# Patient Record
Sex: Female | Born: 1950
Health system: Southern US, Community
[De-identification: ages and names within clinical notes are randomized; demographics above are authoritative.]

## PROBLEM LIST (undated history)

## (undated) DIAGNOSIS — E785 Hyperlipidemia, unspecified: Secondary | ICD-10-CM

## (undated) DIAGNOSIS — I10 Essential (primary) hypertension: Secondary | ICD-10-CM

## (undated) DIAGNOSIS — M1990 Unspecified osteoarthritis, unspecified site: Secondary | ICD-10-CM

## (undated) DIAGNOSIS — IMO0001 Reserved for inherently not codable concepts without codable children: Secondary | ICD-10-CM

## (undated) DIAGNOSIS — G709 Myoneural disorder, unspecified: Secondary | ICD-10-CM

## (undated) DIAGNOSIS — T8859XA Other complications of anesthesia, initial encounter: Secondary | ICD-10-CM

## (undated) DIAGNOSIS — M7551 Bursitis of right shoulder: Secondary | ICD-10-CM

## (undated) DIAGNOSIS — R609 Edema, unspecified: Secondary | ICD-10-CM

## (undated) DIAGNOSIS — E669 Obesity, unspecified: Secondary | ICD-10-CM

## (undated) DIAGNOSIS — L02419 Cutaneous abscess of limb, unspecified: Secondary | ICD-10-CM

## (undated) DIAGNOSIS — IMO0002 Reserved for concepts with insufficient information to code with codable children: Secondary | ICD-10-CM

## (undated) DIAGNOSIS — G4733 Obstructive sleep apnea (adult) (pediatric): Secondary | ICD-10-CM

## (undated) DIAGNOSIS — F329 Major depressive disorder, single episode, unspecified: Secondary | ICD-10-CM

## (undated) DIAGNOSIS — F419 Anxiety disorder, unspecified: Secondary | ICD-10-CM

## (undated) DIAGNOSIS — G4730 Sleep apnea, unspecified: Secondary | ICD-10-CM

## (undated) DIAGNOSIS — M2550 Pain in unspecified joint: Secondary | ICD-10-CM

## (undated) DIAGNOSIS — Z8709 Personal history of other diseases of the respiratory system: Secondary | ICD-10-CM

## (undated) DIAGNOSIS — R600 Localized edema: Secondary | ICD-10-CM

## (undated) DIAGNOSIS — T4145XA Adverse effect of unspecified anesthetic, initial encounter: Secondary | ICD-10-CM

## (undated) DIAGNOSIS — M2540 Effusion, unspecified joint: Secondary | ICD-10-CM

## (undated) DIAGNOSIS — F32A Depression, unspecified: Secondary | ICD-10-CM

## (undated) DIAGNOSIS — L03119 Cellulitis of unspecified part of limb: Secondary | ICD-10-CM

## (undated) DIAGNOSIS — Z973 Presence of spectacles and contact lenses: Secondary | ICD-10-CM

## (undated) DIAGNOSIS — K219 Gastro-esophageal reflux disease without esophagitis: Secondary | ICD-10-CM

## (undated) DIAGNOSIS — C50919 Malignant neoplasm of unspecified site of unspecified female breast: Secondary | ICD-10-CM

## (undated) DIAGNOSIS — T7840XA Allergy, unspecified, initial encounter: Secondary | ICD-10-CM

## (undated) DIAGNOSIS — G2581 Restless legs syndrome: Secondary | ICD-10-CM

## (undated) DIAGNOSIS — Z923 Personal history of irradiation: Secondary | ICD-10-CM

## (undated) DIAGNOSIS — H269 Unspecified cataract: Secondary | ICD-10-CM

## (undated) HISTORY — DX: Presence of spectacles and contact lenses: Z97.3

## (undated) HISTORY — PX: JOINT REPLACEMENT: SHX530

## (undated) HISTORY — DX: Cellulitis of unspecified part of limb: L03.119

## (undated) HISTORY — DX: Obstructive sleep apnea (adult) (pediatric): G47.33

## (undated) HISTORY — PX: COLONOSCOPY: SHX174

## (undated) HISTORY — DX: Myoneural disorder, unspecified: G70.9

## (undated) HISTORY — DX: Anxiety disorder, unspecified: F41.9

## (undated) HISTORY — DX: Depression, unspecified: F32.A

## (undated) HISTORY — DX: Major depressive disorder, single episode, unspecified: F32.9

## (undated) HISTORY — DX: Gastro-esophageal reflux disease without esophagitis: K21.9

## (undated) HISTORY — PX: TUBAL LIGATION: SHX77

## (undated) HISTORY — DX: Sleep apnea, unspecified: G47.30

## (undated) HISTORY — DX: Essential (primary) hypertension: I10

## (undated) HISTORY — DX: Reserved for inherently not codable concepts without codable children: IMO0001

## (undated) HISTORY — DX: Allergy, unspecified, initial encounter: T78.40XA

## (undated) HISTORY — DX: Obesity, unspecified: E66.9

## (undated) HISTORY — PX: INNER EAR SURGERY: SHX679

## (undated) HISTORY — DX: Unspecified osteoarthritis, unspecified site: M19.90

## (undated) HISTORY — DX: Reserved for concepts with insufficient information to code with codable children: IMO0002

## (undated) HISTORY — DX: Cutaneous abscess of limb, unspecified: L02.419

## (undated) HISTORY — DX: Unspecified cataract: H26.9

## (undated) SURGERY — COLONOSCOPY WITH PROPOFOL
Anesthesia: Monitor Anesthesia Care

---
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
## 1999-09-11 ENCOUNTER — Other Ambulatory Visit: Admission: RE | Admit: 1999-09-11 | Discharge: 1999-09-11 | Payer: Self-pay | Admitting: Family Medicine

## 2000-08-14 ENCOUNTER — Encounter: Payer: Self-pay | Admitting: Family Medicine

## 2000-08-14 ENCOUNTER — Encounter: Admission: RE | Admit: 2000-08-14 | Discharge: 2000-08-14 | Payer: Self-pay | Admitting: Family Medicine

## 2000-09-08 ENCOUNTER — Other Ambulatory Visit: Admission: RE | Admit: 2000-09-08 | Discharge: 2000-09-08 | Payer: Self-pay | Admitting: Family Medicine

## 2002-05-18 ENCOUNTER — Ambulatory Visit (HOSPITAL_BASED_OUTPATIENT_CLINIC_OR_DEPARTMENT_OTHER): Admission: RE | Admit: 2002-05-18 | Discharge: 2002-05-18 | Payer: Self-pay | Admitting: *Deleted

## 2002-06-22 ENCOUNTER — Ambulatory Visit (HOSPITAL_BASED_OUTPATIENT_CLINIC_OR_DEPARTMENT_OTHER): Admission: RE | Admit: 2002-06-22 | Discharge: 2002-06-22 | Payer: Self-pay | Admitting: *Deleted

## 2006-06-23 ENCOUNTER — Ambulatory Visit: Payer: Self-pay | Admitting: Family Medicine

## 2006-07-02 ENCOUNTER — Ambulatory Visit: Payer: Self-pay | Admitting: Family Medicine

## 2006-07-09 ENCOUNTER — Other Ambulatory Visit: Admission: RE | Admit: 2006-07-09 | Discharge: 2006-07-09 | Payer: Self-pay | Admitting: Family Medicine

## 2006-07-09 ENCOUNTER — Encounter: Payer: Self-pay | Admitting: Family Medicine

## 2006-07-09 ENCOUNTER — Ambulatory Visit: Payer: Self-pay | Admitting: Family Medicine

## 2006-08-10 ENCOUNTER — Ambulatory Visit: Payer: Self-pay | Admitting: Family Medicine

## 2006-09-02 ENCOUNTER — Ambulatory Visit: Payer: Self-pay | Admitting: Gastroenterology

## 2006-09-17 ENCOUNTER — Ambulatory Visit: Payer: Self-pay | Admitting: Gastroenterology

## 2006-10-12 ENCOUNTER — Ambulatory Visit: Payer: Self-pay | Admitting: Family Medicine

## 2006-10-12 LAB — CONVERTED CEMR LAB
Creatinine, Ser: 1 mg/dL (ref 0.4–1.2)
Potassium: 4.8 meq/L (ref 3.5–5.1)

## 2006-10-29 ENCOUNTER — Ambulatory Visit: Payer: Self-pay

## 2006-10-30 ENCOUNTER — Ambulatory Visit: Payer: Self-pay | Admitting: Family Medicine

## 2006-11-19 ENCOUNTER — Ambulatory Visit: Payer: Self-pay | Admitting: Family Medicine

## 2006-12-01 LAB — HM COLONOSCOPY: HM Colonoscopy: NORMAL

## 2006-12-28 ENCOUNTER — Ambulatory Visit: Payer: Self-pay | Admitting: Family Medicine

## 2007-01-29 ENCOUNTER — Ambulatory Visit: Payer: Self-pay | Admitting: Family Medicine

## 2007-01-29 LAB — CONVERTED CEMR LAB
Creatinine, Ser: 1 mg/dL (ref 0.4–1.2)
Glucose, Bld: 87 mg/dL (ref 70–99)
Potassium: 4.3 meq/L (ref 3.5–5.1)
Sodium: 136 meq/L (ref 135–145)

## 2007-06-18 DIAGNOSIS — I10 Essential (primary) hypertension: Secondary | ICD-10-CM

## 2007-06-18 DIAGNOSIS — F32A Depression, unspecified: Secondary | ICD-10-CM | POA: Insufficient documentation

## 2007-06-18 DIAGNOSIS — F329 Major depressive disorder, single episode, unspecified: Secondary | ICD-10-CM

## 2008-01-25 ENCOUNTER — Ambulatory Visit: Payer: Self-pay | Admitting: Family Medicine

## 2008-01-25 LAB — CONVERTED CEMR LAB
AST: 22 units/L (ref 0–37)
Basophils Relative: 0.1 % (ref 0.0–1.0)
CO2: 29 meq/L (ref 19–32)
Chloride: 103 meq/L (ref 96–112)
Creatinine, Ser: 0.9 mg/dL (ref 0.4–1.2)
Direct LDL: 159.4 mg/dL
Eosinophils Relative: 3.3 % (ref 0.0–5.0)
Glucose, Urine, Semiquant: NEGATIVE
HCT: 38.3 % (ref 36.0–46.0)
Hemoglobin: 13 g/dL (ref 12.0–15.0)
Monocytes Absolute: 0.6 10*3/uL (ref 0.2–0.7)
Neutrophils Relative %: 49.8 % (ref 43.0–77.0)
Nitrite: NEGATIVE
Potassium: 4.9 meq/L (ref 3.5–5.1)
Protein, U semiquant: NEGATIVE
RBC: 4.72 M/uL (ref 3.87–5.11)
RDW: 13.3 % (ref 11.5–14.6)
Sodium: 140 meq/L (ref 135–145)
TSH: 1.84 microintl units/mL (ref 0.35–5.50)
Total Bilirubin: 0.6 mg/dL (ref 0.3–1.2)
Total CHOL/HDL Ratio: 7
Total Protein: 6.4 g/dL (ref 6.0–8.3)
Triglycerides: 238 mg/dL (ref 0–149)
Urobilinogen, UA: 0.2
VLDL: 48 mg/dL — ABNORMAL HIGH (ref 0–40)
WBC: 5 10*3/uL (ref 4.5–10.5)
pH: 6

## 2008-02-01 ENCOUNTER — Ambulatory Visit: Payer: Self-pay | Admitting: Family Medicine

## 2008-02-04 ENCOUNTER — Telehealth: Payer: Self-pay | Admitting: Family Medicine

## 2008-02-17 ENCOUNTER — Ambulatory Visit: Payer: Self-pay | Admitting: Family Medicine

## 2008-03-23 ENCOUNTER — Ambulatory Visit: Payer: Self-pay

## 2008-03-23 ENCOUNTER — Encounter: Payer: Self-pay | Admitting: Family Medicine

## 2008-03-28 ENCOUNTER — Ambulatory Visit: Payer: Self-pay | Admitting: Family Medicine

## 2008-06-24 ENCOUNTER — Ambulatory Visit (HOSPITAL_BASED_OUTPATIENT_CLINIC_OR_DEPARTMENT_OTHER): Admission: RE | Admit: 2008-06-24 | Discharge: 2008-06-24 | Payer: Self-pay | Admitting: Nephrology

## 2008-10-13 ENCOUNTER — Encounter: Admission: RE | Admit: 2008-10-13 | Discharge: 2008-10-13 | Payer: Self-pay | Admitting: Family Medicine

## 2008-10-13 LAB — HM MAMMOGRAPHY

## 2008-12-22 ENCOUNTER — Ambulatory Visit (HOSPITAL_BASED_OUTPATIENT_CLINIC_OR_DEPARTMENT_OTHER): Admission: RE | Admit: 2008-12-22 | Discharge: 2008-12-22 | Payer: Self-pay | Admitting: Nephrology

## 2008-12-30 ENCOUNTER — Ambulatory Visit: Payer: Self-pay | Admitting: Internal Medicine

## 2009-01-18 ENCOUNTER — Ambulatory Visit (HOSPITAL_BASED_OUTPATIENT_CLINIC_OR_DEPARTMENT_OTHER): Admission: RE | Admit: 2009-01-18 | Discharge: 2009-01-18 | Payer: Self-pay | Admitting: Orthopedic Surgery

## 2009-01-25 ENCOUNTER — Ambulatory Visit: Payer: Self-pay | Admitting: Family Medicine

## 2009-01-25 LAB — CONVERTED CEMR LAB
ALT: 16 U/L
AST: 16 U/L
Albumin: 3.6 g/dL
Alkaline Phosphatase: 66 U/L
BUN: 21 mg/dL
Basophils Absolute: 0 K/uL
Basophils Relative: 0.3 %
Bilirubin Urine: NEGATIVE
Bilirubin, Direct: 0.1 mg/dL
CO2: 30 meq/L
Calcium: 9.1 mg/dL
Chloride: 101 meq/L
Cholesterol: 260 mg/dL
Creatinine, Ser: 0.8 mg/dL
Direct LDL: 181.8 mg/dL
Eosinophils Absolute: 0.3 K/uL
Eosinophils Relative: 4.1 %
GFR calc Af Amer: 95 mL/min
GFR calc non Af Amer: 78 mL/min
Glucose, Bld: 104 mg/dL — ABNORMAL HIGH
Glucose, Urine, Semiquant: NEGATIVE
HCT: 40.7 %
HDL: 41.7 mg/dL
Hemoglobin: 13.8 g/dL
Ketones, urine, test strip: NEGATIVE
Lymphocytes Relative: 26.1 %
MCHC: 33.8 g/dL
MCV: 84.7 fL
Monocytes Absolute: 0.6 K/uL
Monocytes Relative: 8.8 %
Neutro Abs: 4.2 K/uL
Neutrophils Relative %: 60.7 %
Nitrite: NEGATIVE
Platelets: 230 K/uL
Potassium: 3.5 meq/L
RBC: 4.8 M/uL
RDW: 13.6 %
Sodium: 140 meq/L
Specific Gravity, Urine: 1.025
TSH: 1.99 u[IU]/mL
Total Bilirubin: 0.9 mg/dL
Total CHOL/HDL Ratio: 6.2
Total Protein: 7 g/dL
Triglycerides: 160 mg/dL — ABNORMAL HIGH
Urobilinogen, UA: 0.2
VLDL: 32 mg/dL
WBC: 6.9 10*3/microliter
pH: 6

## 2009-02-16 ENCOUNTER — Other Ambulatory Visit: Admission: RE | Admit: 2009-02-16 | Discharge: 2009-02-16 | Payer: Self-pay | Admitting: Family Medicine

## 2009-02-16 ENCOUNTER — Encounter: Payer: Self-pay | Admitting: Family Medicine

## 2009-02-16 ENCOUNTER — Ambulatory Visit: Payer: Self-pay | Admitting: Family Medicine

## 2009-02-16 DIAGNOSIS — G4730 Sleep apnea, unspecified: Secondary | ICD-10-CM | POA: Insufficient documentation

## 2009-02-16 DIAGNOSIS — E785 Hyperlipidemia, unspecified: Secondary | ICD-10-CM | POA: Insufficient documentation

## 2009-04-25 ENCOUNTER — Ambulatory Visit: Payer: Self-pay | Admitting: Family Medicine

## 2009-04-25 DIAGNOSIS — E7800 Pure hypercholesterolemia, unspecified: Secondary | ICD-10-CM

## 2009-04-25 LAB — CONVERTED CEMR LAB
ALT: 16 units/L (ref 0–35)
Albumin: 3.4 g/dL — ABNORMAL LOW (ref 3.5–5.2)
Bilirubin, Direct: 0.1 mg/dL (ref 0.0–0.3)
Total CHOL/HDL Ratio: 4
Total Protein: 6.9 g/dL (ref 6.0–8.3)
Triglycerides: 98 mg/dL (ref 0.0–149.0)

## 2009-05-03 ENCOUNTER — Ambulatory Visit: Payer: Self-pay | Admitting: Family Medicine

## 2009-05-09 ENCOUNTER — Telehealth: Payer: Self-pay | Admitting: *Deleted

## 2009-10-15 ENCOUNTER — Telehealth: Payer: Self-pay | Admitting: Family Medicine

## 2009-10-15 ENCOUNTER — Ambulatory Visit: Payer: Self-pay | Admitting: Family Medicine

## 2009-10-15 DIAGNOSIS — B372 Candidiasis of skin and nail: Secondary | ICD-10-CM

## 2009-10-16 ENCOUNTER — Encounter: Payer: Self-pay | Admitting: Family Medicine

## 2009-10-17 ENCOUNTER — Ambulatory Visit (HOSPITAL_COMMUNITY): Admission: RE | Admit: 2009-10-17 | Discharge: 2009-10-17 | Payer: Self-pay | Admitting: Orthopedic Surgery

## 2009-10-18 ENCOUNTER — Telehealth: Payer: Self-pay | Admitting: Family Medicine

## 2009-11-14 ENCOUNTER — Inpatient Hospital Stay (HOSPITAL_COMMUNITY): Admission: RE | Admit: 2009-11-14 | Discharge: 2009-11-19 | Payer: Self-pay | Admitting: Orthopedic Surgery

## 2010-03-19 ENCOUNTER — Ambulatory Visit: Payer: Self-pay | Admitting: Family Medicine

## 2010-03-19 LAB — CONVERTED CEMR LAB
Alkaline Phosphatase: 79 units/L (ref 39–117)
Basophils Absolute: 0 10*3/uL (ref 0.0–0.1)
Bilirubin, Direct: 0.1 mg/dL (ref 0.0–0.3)
Blood in Urine, dipstick: NEGATIVE
CO2: 30 meq/L (ref 19–32)
Calcium: 8.9 mg/dL (ref 8.4–10.5)
Creatinine, Ser: 0.8 mg/dL (ref 0.4–1.2)
Eosinophils Absolute: 0.2 10*3/uL (ref 0.0–0.7)
Glucose, Bld: 97 mg/dL (ref 70–99)
HDL: 50.3 mg/dL (ref >39.00)
Ketones, urine, test strip: NEGATIVE
Lymphocytes Relative: 25.1 % (ref 12.0–46.0)
MCHC: 33.6 g/dL (ref 30.0–36.0)
Neutrophils Relative %: 61.7 % (ref 43.0–77.0)
Nitrite: NEGATIVE
Protein, U semiquant: NEGATIVE
RDW: 15.1 % — ABNORMAL HIGH (ref 11.5–14.6)
Specific Gravity, Urine: 1.015
Total Bilirubin: 0.5 mg/dL (ref 0.3–1.2)
Total CHOL/HDL Ratio: 4
Triglycerides: 129 mg/dL (ref 0.0–149.0)
VLDL: 25.8 mg/dL (ref 0.0–40.0)
pH: 7

## 2010-03-21 ENCOUNTER — Inpatient Hospital Stay (HOSPITAL_COMMUNITY): Admission: RE | Admit: 2010-03-21 | Discharge: 2010-03-27 | Payer: Self-pay | Admitting: Otolaryngology

## 2010-04-05 ENCOUNTER — Ambulatory Visit: Payer: Self-pay | Admitting: Family Medicine

## 2010-04-10 ENCOUNTER — Telehealth: Payer: Self-pay | Admitting: Family Medicine

## 2010-07-03 ENCOUNTER — Telehealth: Payer: Self-pay | Admitting: Family Medicine

## 2010-12-22 ENCOUNTER — Encounter: Payer: Self-pay | Admitting: Family Medicine

## 2010-12-31 NOTE — Progress Notes (Signed)
Summary: call pt tom am per dr p  Phone Note From Other Clinic Call back at pt c [phone]   Caller: [facility] [phone],jodette, rn vm3:57 a disconnected number Call For: dr Fabian Sharp for dr Paulena Servais Summary of Call: 200/100 on Coreg 25mg  daily, Lasix 60mg  daily.  Postop knee surgery.  Here for  [facility] line care.  Should she retake BP or retake meds this pm?  Called the nurse's number, which is says a disconnected.  Called patient's c [phone] and she has not been dismissed by surgeon, Dr. Lollie Sails. Eulah Pont?  She is calling them.  She says she doesn't have any symptoms, no headache or dizziness.  She doesn't have another number for the [facility] nurse.   Rudy Jew, RN  Apr 10, 2010 4:31 PM Robb Matar & they do not have her as an active client & no Jodette works for them.  Called patient again.  She has no symptoms of anything & says she is doing well.  She will take her BP again this pm & tomorrow am & call back to office then.  She feels the sub nurse who was late got her upset & she's fine.  Dr. Shelba Flake office advised her to call Dr. Tawanna Cooler saying they do not regulate BP.  Says Caron Presume is her [facility] nurse.   Rudy Jew, RN  Apr 10, 2010 4:44 PM   Initial call taken by: Rudy Jew, RN,  Apr 10, 2010 4:25 PM  Follow-up for Phone Call        Per Dr. Fabian Sharp- Recommend calling pt in am. Follow-up by: Romualdo Bolk, CMA Duncan Dull),  Apr 10, 2010 4:57 PM     Appended Document: call pt tom am per dr p BP today is normal per pt.

## 2010-12-31 NOTE — Assessment & Plan Note (Signed)
Summary: cpx/pap/njr/pt rescd from bump//ccm/PT RSCD FROM [facility]//CCM---...   Vital Signs:  Patient profile:   60 year old female Height:      65 inches Weight:      284 pounds BMI:     47.43 Temp:     98.4 degrees F oral BP sitting:   162 / 92  (left arm)  Vitals Entered By: Kern Reap CMA Duncan Dull) (Apr 05, 2010 4:07 PM) CC: follow-up visit   CC:  follow-up visit.  History of Present Illness: Tamara [name] is a 60 year old female, nonsmoker, who comes in today for general medical exam, because of a history of underlying hyperlipidemia, obesity, hypertension, depression, and chronic fungal infection of her abdomen.  Her hyperlipidemia is treated with simvastatin 20 mg nightly lipids are ago.  Her weight is unchanged 284 pounds.  Her blood pressure is treated with Lasix 40 mg in the morning, 20 mg at noon, Micardis 80 mg q.a.m. and carvedilol 25 mg b.i.d. BP 140/80.  Normal renal function.  She also takes 120 mEq potassium supplement daily, potassium, normal 4.2.  She also takes Prozac 40 mg daily for depression.  She takes a baby aspirin daily.  She has a chronic fungal infection.  Because of a panniculus the dermatologist, Dr. Margo Aye has tried many different treatments.  Nothing seems to work.  This past winter.  She had a right total knee replacement.  She did well, but fell April, the first in 3 weeks later, developed an effusion.  Surgical drainage.  It showed a staph, but not MRSA.  She slowly recovering from that.  She is on IV antibiotics via a PICC line at home  Past History:  Past medical, surgical, family and social histories (including risk factors) reviewed, and no changes noted (except as noted below).  Past Medical History: Reviewed history from 02/01/2008 and no changes required. Depression Hypertension PMS obesity  Past Surgical History: Reviewed history from 06/18/2007 and no changes required. Colonoscopy-09/17/2006  Family History: Reviewed history from  02/01/2008 and no changes required. Family History of Asthma Family History Hypertension Family History Thyroid disease Fam hx MI mother has dementia  Social History: Reviewed history from 02/01/2008 and no changes required. Occupation: Single Never Smoked Alcohol use-no Drug use-no Regular exercise-no  Review of Systems      See HPI  Physical Exam  General:  Well-developed,well-nourished,in no acute distress; alert,appropriate and cooperative throughout examination Head:  Normocephalic and atraumatic without obvious abnormalities. No apparent alopecia or balding. Eyes:  No corneal or conjunctival inflammation noted. EOMI. Perrla. Funduscopic exam benign, without hemorrhages, exudates or papilledema. Vision grossly normal. Ears:  External ear exam shows no significant lesions or deformities.  Otoscopic examination reveals clear canals, tympanic membranes are intact bilaterally without bulging, retraction, inflammation or discharge. Hearing is grossly normal bilaterally. Nose:  External nasal examination shows no deformity or inflammation. Nasal mucosa are pink and moist without lesions or exudates. Mouth:  Oral mucosa and oropharynx without lesions or exudates.  Teeth in good repair. Neck:  No deformities, masses, or tenderness noted. Chest Wall:  No deformities, masses, or tenderness noted. Breasts:  No mass, nodules, thickening, tenderness, bulging, retraction, inflamation, nipple discharge or skin changes noted.   Lungs:  Normal respiratory effort, chest expands symmetrically. Lungs are clear to auscultation, no crackles or wheezes. Heart:  Normal rate and regular rhythm. S1 and S2 normal without gallop, murmur, click, rub or other extra sounds. Abdomen:  Bowel sounds positive,abdomen soft and non-tender without masses, organomegaly or hernias  noted. 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 Alert and cooperative with normal  attention span and concentration. No apparent delusions, illusions, hallucinations   Impression & Recommendations:  Problem # 1:  CANDIDIASIS, SKIN (ICD-112.3) Assessment Unchanged  Orders: Prescription Created Electronically [phone])  Problem # 2:  PURE HYPERCHOLESTEROLEMIA (ICD-272.0) Assessment: Improved  Her updated medication list for this problem includes:    Simvastatin 20 Mg Tabs (Simvastatin) .Marland Kitchen... 1 tab @ bedtime  Problem # 3:  HYPERLIPIDEMIA (ICD-272.4) Assessment: Improved  Her updated medication list for this problem includes:    Simvastatin 20 Mg Tabs (Simvastatin) .Marland Kitchen... 1 tab @ bedtime  Orders: Prescription Created Electronically [phone])  Problem # 4:  MORBID OBESITY (ICD-278.01) Assessment: Unchanged  Orders: Prescription Created Electronically [phone])  Problem # 5:  HEALTH SCREENING (ICD-V70.0) Assessment: Unchanged  Orders: Prescription Created Electronically [phone])  Problem # 6:  HYPERTENSION (ICD-401.9) Assessment: Improved  Her updated medication list for this problem includes:    Furosemide 40 Mg Tabs (Furosemide) .Marland Kitchen... Take 1 tablet by mouth every morning    Furosemide 20 Mg Tabs (Furosemide) .Marland Kitchen... Take 1 tablet by mouth once a day @ noon    Micardis 80 Mg Tabs (Telmisartan) ..... Once daily    Carvedilol 25 Mg Tabs (Carvedilol) .Marland Kitchen... Take one tab two times a day  Orders: Prescription Created Electronically [phone])  Complete Medication List: 1)  Klor-con M20 20 Meq Tbcr (Potassium chloride crys cr) .... Take 1 tablet by mouth every morning 2)  Paroxetine Hcl 40 Mg Tabs (Paroxetine hcl) .... Take 1 tablet by mouth every morning 3)  Furosemide 40 Mg Tabs (Furosemide) .... Take 1 tablet by mouth every morning 4)  Furosemide 20 Mg Tabs (Furosemide) .... Take 1 tablet by mouth once a day @ noon 5)  Albuterol 90 Mcg/act Aers (Albuterol) .... Uad, as needed 6)  Adult Aspirin Ec Low Strength 81 Mg Tbec (Aspirin) .... Once daily 7)  Micardis 80  Mg Tabs (Telmisartan) .... Once daily 8)  Carvedilol 25 Mg Tabs (Carvedilol) .... Take one tab two times a day 9)  Simvastatin 20 Mg Tabs (Simvastatin) .Marland Kitchen.. 1 tab @ bedtime 10)  Mucinex D [phone] Mg Xr12h-tab (Pseudoephedrine-guaifenesin) .... As needed 11)  Norco 5-325 Mg Tabs (Hydrocodone-acetaminophen) .... Take one tab by mouth every 12 hours as needed for pain 12)  Advil Pm 200-38 Mg Tabs (Ibuprofen-diphenhydramine cit) .... Take 2 tabs by mouth at bedtime 13)  Cefazolin Sodium 1 Gm Solr (Cefazolin sodium) .... Use as directed 14)  Diflucan 100 Mg Tabs (Fluconazole) .Marland Kitchen.. 1 po  3 x week 15)  Micro Guard 2 % Powd (Miconazole nitrate) .... Apply two times a day 16)  Depakote Er 250 Mg Tb24 (Divalproex sodium (migraine))  Patient Instructions: 1)  I would call Dr. Margo Aye, again to see if he has any other ideas.  In the meantime take a Diflucan Monday, Wednesday, Friday. 2)  Continue other medications. 3)  Please schedule a follow-up appointment in 1 year. Prescriptions: MICRO GUARD 2 % POWD (MICONAZOLE NITRATE) apply two times a day  #6oz x 11   Entered and Authorized by:   Roderick Pee MD   Signed by:   Roderick Pee MD on 04/05/2010   Method used:   Electronically to        [facility]  [phone]* (retail)       [address]       [place], [place]  [address]       Ph: [phone] or [phone]       Fax: [phone]   RxID:   [id number] DIFLUCAN 100 MG TABS (FLUCONAZOLE) 1 po  3 x week  #10 x 2   Entered and Authorized by:   Roderick Pee MD   Signed by:   Roderick Pee MD on 04/05/2010   Method used:   Electronically to        [facility]  [phone]* (retail)       [address]       [place], [place]  [address]       Ph: [phone] or [phone]       Fax: [phone]   RxID:   [id number] SIMVASTATIN 20 MG TABS (SIMVASTATIN) 1 tab @ bedtime  #100 x 3   Entered and Authorized by:    Roderick Pee MD   Signed by:   Roderick Pee MD on 04/05/2010   Method used:   Electronically to        [facility]  [phone]* (retail)       [address]       [place], [place]  [address]       Ph: [phone] or [phone]       Fax: [phone]   RxID:   [id number] CARVEDILOL 25 MG TABS (CARVEDILOL) take one tab two times a day  #200 x 3   Entered and Authorized by:   Roderick Pee MD   Signed by:   Roderick Pee MD on 04/05/2010   Method used:   Electronically to        [facility]  [phone]* (retail)       [address]       [place], [place]  [address]       Ph: [phone] or [phone]       Fax: [phone]   RxID:   [id number] MICARDIS 80 MG TABS (TELMISARTAN) once daily  #100 Each x 3   Entered and Authorized by:   Roderick Pee MD   Signed by:   Roderick Pee MD on 04/05/2010   Method used:   Electronically to        [facility]  [phone]* (retail)       [address]       [place], [place]  [address]       Ph: [phone] or [phone]       Fax: [phone]   RxID:   [id number] FUROSEMIDE 20 MG  TABS (FUROSEMIDE) Take 1 tablet by mouth once a day @ noon  #100 x 3   Entered and Authorized by:   Roderick Pee MD   Signed by:   Roderick Pee MD on 04/05/2010   Method used:   Electronically to        [facility]  [phone]* (retail)       [address]       [place], [place]  [address]       Ph: [phone] or [phone]       Fax: [phone]   RxID:   [id number] FUROSEMIDE 40 MG  TABS (FUROSEMIDE) Take 1 tablet by  mouth every morning  #100 x 3   Entered and Authorized by:   Roderick Pee MD   Signed by:   Roderick Pee MD on 04/05/2010   Method used:   Electronically to        [facility]  [phone]* (retail)       [address]       [place], [place]  [address]       Ph: [phone] or [phone]       Fax: [phone]   RxID:   [id number] PAROXETINE HCL 40 MG  TABS (PAROXETINE HCL) Take 1 tablet by mouth every morning  #100 x 3   Entered and Authorized by:   Roderick Pee MD   Signed by:   Roderick Pee MD on 04/05/2010   Method used:   Electronically to        [facility]  [phone]* (retail)       [address]       [place], [place]  [address]       Ph: [phone] or [phone]       Fax: [phone]   RxID:   [id number] KLOR-CON M20 20 MEQ  TBCR (POTASSIUM CHLORIDE CRYS CR) Take 1 tablet by mouth every morning  #100 Each x 3   Entered and Authorized by:   Roderick Pee MD   Signed by:   Roderick Pee MD on 04/05/2010   Method used:   Electronically to        [facility]  [phone]* (retail)       [address]       [place], [place]  [address]       Ph: [phone] or [phone]       Fax: [phone]   RxID:   [id number]

## 2010-12-31 NOTE — Progress Notes (Signed)
Summary: simvastatin refill  Phone Note Refill Request Message from:  Fax from Pharmacy on July 03, 2010 12:38 PM  Refills Requested: Medication #1:  SIMVASTATIN 20 MG TABS 1 tab @ bedtime Initial call taken by: Kern Reap CMA Duncan Dull),  July 03, 2010 12:38 PM    Prescriptions: SIMVASTATIN 20 MG TABS (SIMVASTATIN) 1 tab @ bedtime  #100 x 3   Entered by:   Kern Reap CMA (AAMA)   Authorized by:   Roderick Pee MD   Signed by:   Kern Reap CMA (AAMA) on 07/03/2010   Method used:   Electronically to        [facility]  [phone]* (retail)       [address]       [place], [place]  [address]       Ph: [phone] or [phone]       Fax: [phone]   RxID:   [id number]

## 2011-01-03 IMAGING — CR DG KNEE 1-2V PORT*R*
2 series · 2 of 2 positions shown · non-contrast
Comparison: None

CLINICAL DATA: Postop from right knee arthroplasty.
Osteoarthritis.

PORTABLE RIGHT KNEE - 1-2 VIEW

[ap/obl knee]
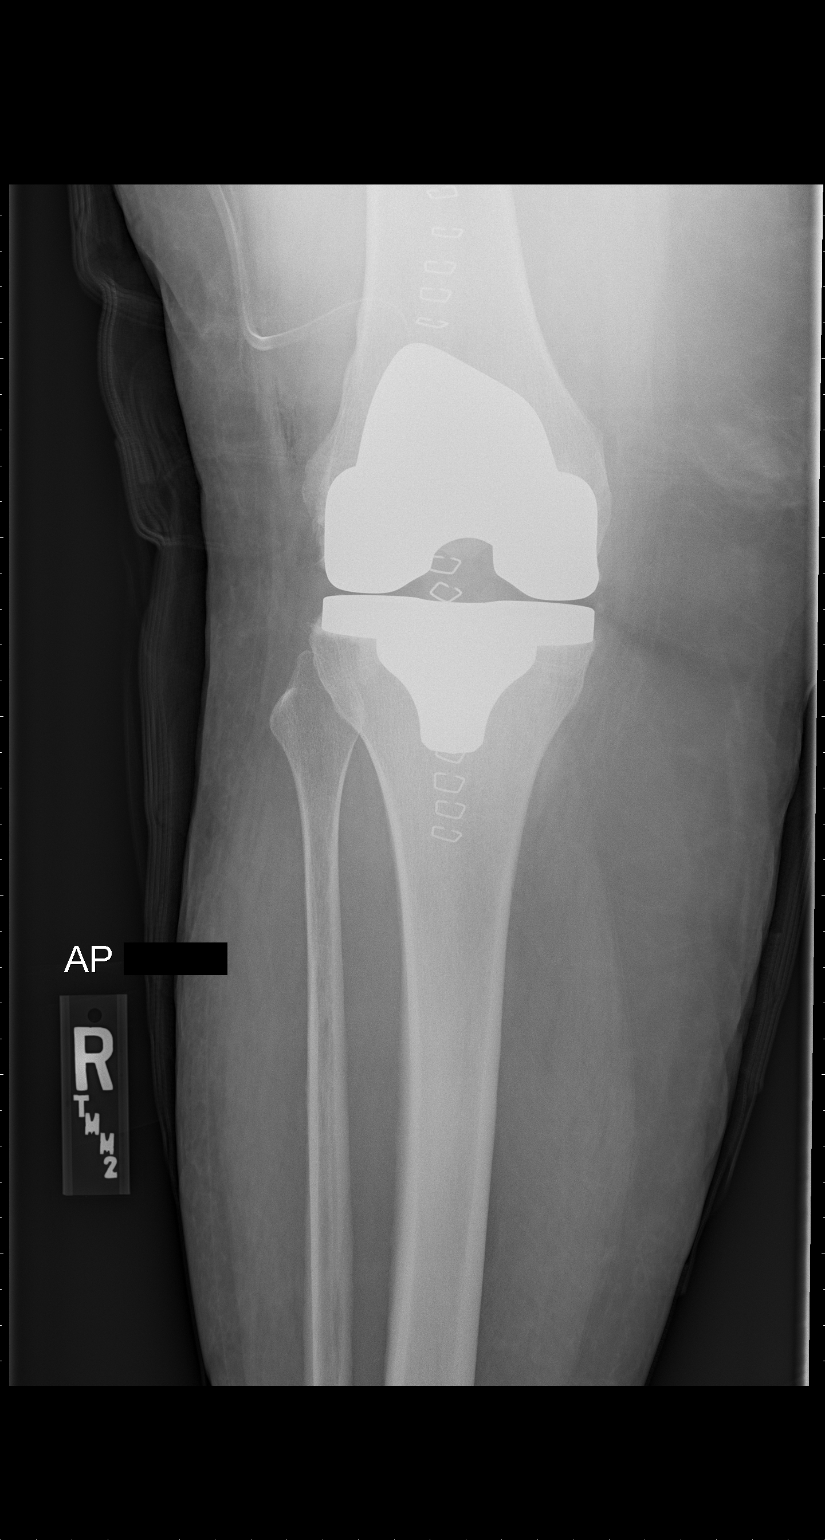

[knee lat]
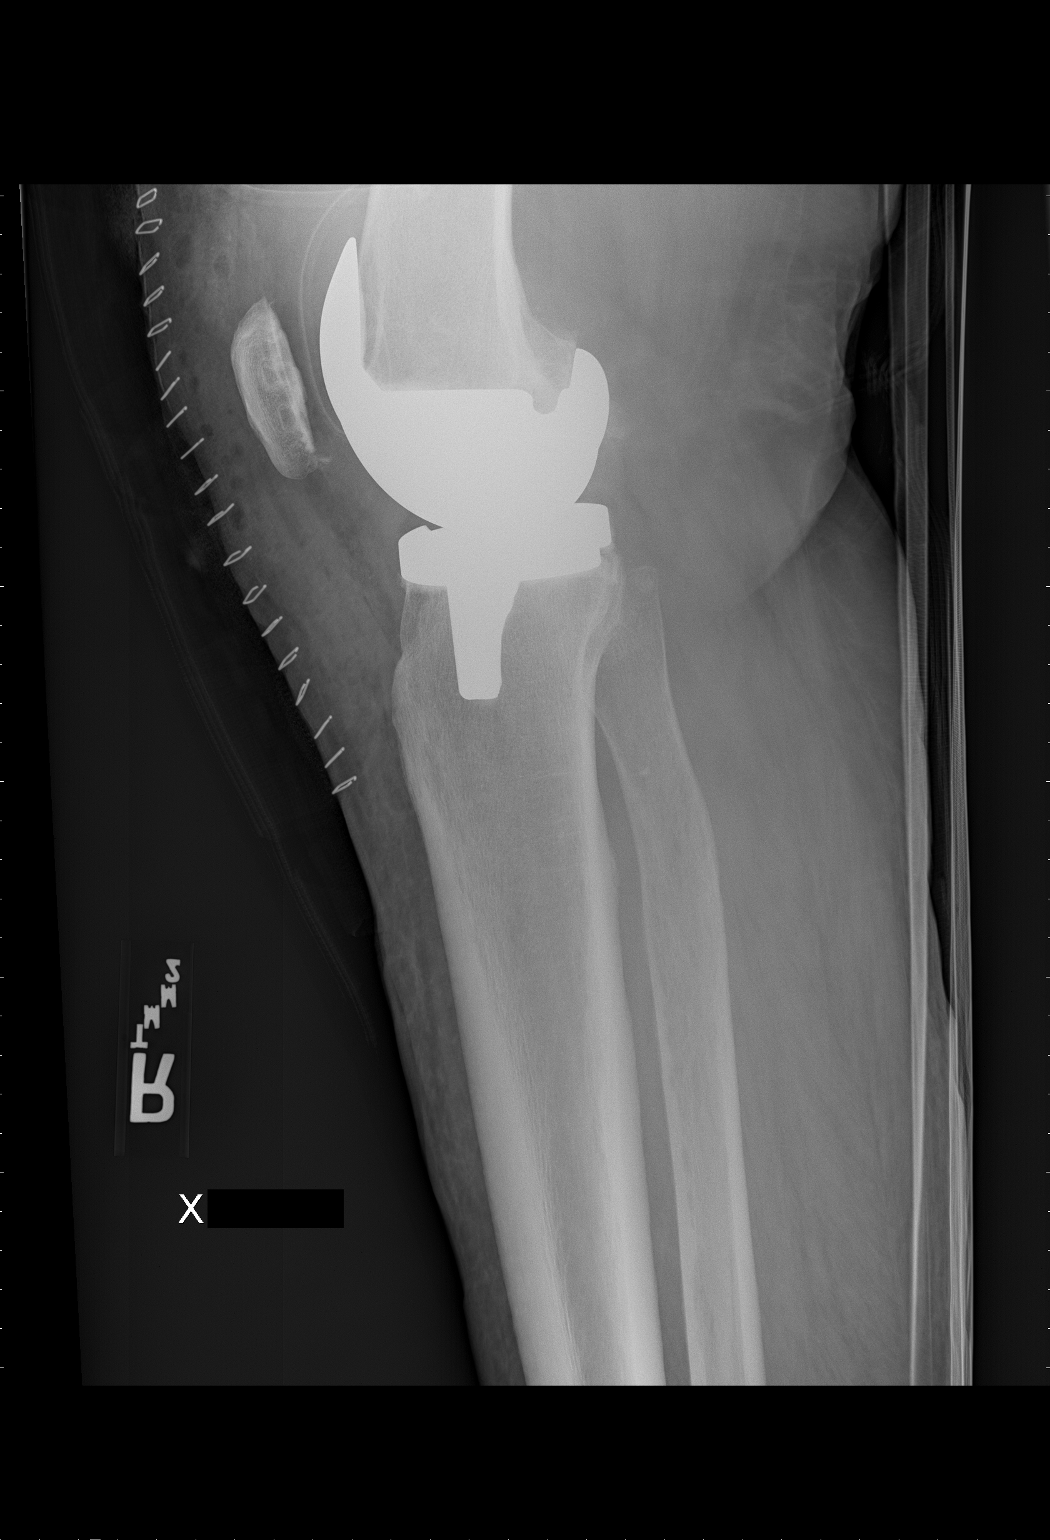

[2 of 2 positions shown; findings below may reference images not displayed]

FINDINGS: Total knee arthroplasty is seen with all three components
in expected position.  No evidence of fracture or dislocation.
Surgical drain seen as well as overlying skin staples.
IMPRESSION: Expected postoperative appearance of the right knee arthroplasty.
No evidence of fracture or dislocation.

## 2011-02-18 LAB — BASIC METABOLIC PANEL
BUN: 11 mg/dL (ref 6–23)
BUN: 9 mg/dL (ref 6–23)
CO2: 23 mEq/L (ref 19–32)
CO2: 30 mEq/L (ref 19–32)
Calcium: 8 mg/dL — ABNORMAL LOW (ref 8.4–10.5)
Chloride: 104 mEq/L (ref 96–112)
Creatinine, Ser: 0.62 mg/dL (ref 0.4–1.2)
Creatinine, Ser: 0.68 mg/dL (ref 0.4–1.2)
GFR calc Af Amer: >60 mL/min (ref >60)
GFR calc non Af Amer: >60 mL/min (ref >60)
GFR calc non Af Amer: >60 mL/min (ref >60)
Glucose, Bld: 112 mg/dL — ABNORMAL HIGH (ref 70–99)
Glucose, Bld: 133 mg/dL — ABNORMAL HIGH (ref 70–99)
Potassium: 3.4 mEq/L — ABNORMAL LOW (ref 3.5–5.1)
Potassium: 3.7 mEq/L (ref 3.5–5.1)

## 2011-02-18 LAB — CBC
HCT: 26.8 % — ABNORMAL LOW (ref 36.0–46.0)
HCT: 27.3 % — ABNORMAL LOW (ref 36.0–46.0)
Hemoglobin: 10.2 g/dL — ABNORMAL LOW (ref 12.0–15.0)
Hemoglobin: 12.4 g/dL (ref 12.0–15.0)
MCHC: 33.4 g/dL (ref 30.0–36.0)
MCHC: 34 g/dL (ref 30.0–36.0)
MCHC: 34.3 g/dL (ref 30.0–36.0)
MCV: 82.4 fL (ref 78.0–100.0)
MCV: 82.8 fL (ref 78.0–100.0)
MCV: 82.9 fL (ref 78.0–100.0)
MCV: 83.3 fL (ref 78.0–100.0)
RBC: 3.24 MIL/uL — ABNORMAL LOW (ref 3.87–5.11)
RBC: 3.31 MIL/uL — ABNORMAL LOW (ref 3.87–5.11)
RBC: 4.39 MIL/uL (ref 3.87–5.11)
RDW: 15.1 % (ref 11.5–15.5)
WBC: 6.3 10*3/uL (ref 4.0–10.5)

## 2011-02-18 LAB — DIFFERENTIAL
Basophils Absolute: 0.1 10*3/uL (ref 0.0–0.1)
Basophils Relative: 0 % (ref 0–1)
Eosinophils Absolute: 0.1 10*3/uL (ref 0.0–0.7)
Eosinophils Relative: 1 % (ref 0–5)
Lymphocytes Relative: 10 % — ABNORMAL LOW (ref 12–46)

## 2011-02-18 LAB — COMPREHENSIVE METABOLIC PANEL
Alkaline Phosphatase: 79 U/L (ref 39–117)
BUN: 17 mg/dL (ref 6–23)
Glucose, Bld: 113 mg/dL — ABNORMAL HIGH (ref 70–99)
Potassium: 4.1 mEq/L (ref 3.5–5.1)
Total Protein: 7.1 g/dL (ref 6.0–8.3)

## 2011-02-18 LAB — ANAEROBIC CULTURE

## 2011-02-18 LAB — SYNOVIAL CELL COUNT + DIFF, W/ CRYSTALS
Neutrophil, Synovial: 5 % (ref 0–25)
WBC, Synovial: 210 /mm3 — ABNORMAL HIGH (ref 0–200)

## 2011-02-18 LAB — APTT: aPTT: 34 seconds (ref 24–37)

## 2011-02-18 LAB — BODY FLUID CULTURE: Culture: NO GROWTH

## 2011-02-18 LAB — C-REACTIVE PROTEIN: CRP: 13.2 mg/dL — ABNORMAL HIGH (ref <0.6)

## 2011-02-18 LAB — SEDIMENTATION RATE: Sed Rate: 30 mm/hr — ABNORMAL HIGH (ref 0–22)

## 2011-02-18 LAB — GRAM STAIN

## 2011-03-03 LAB — PROTIME-INR
INR: 1.06 (ref 0.00–1.49)
INR: 1.15 (ref 0.00–1.49)
INR: 1.64 — ABNORMAL HIGH (ref 0.00–1.49)
Prothrombin Time: 13.7 seconds (ref 11.6–15.2)
Prothrombin Time: 14.6 seconds (ref 11.6–15.2)
Prothrombin Time: 14.6 seconds (ref 11.6–15.2)
Prothrombin Time: 16.8 seconds — ABNORMAL HIGH (ref 11.6–15.2)

## 2011-03-03 LAB — CBC
Hemoglobin: 9.8 g/dL — ABNORMAL LOW (ref 12.0–15.0)
MCHC: 33.1 g/dL (ref 30.0–36.0)
MCHC: 33.4 g/dL (ref 30.0–36.0)
Platelets: 159 10*3/uL (ref 150–400)
Platelets: 222 10*3/uL (ref 150–400)
RBC: 3.48 MIL/uL — ABNORMAL LOW (ref 3.87–5.11)
RBC: 3.99 MIL/uL (ref 3.87–5.11)
RDW: 14.4 % (ref 11.5–15.5)
RDW: 14.7 % (ref 11.5–15.5)
RDW: 14.8 % (ref 11.5–15.5)
WBC: 7 10*3/uL (ref 4.0–10.5)

## 2011-03-03 LAB — BASIC METABOLIC PANEL
BUN: 18 mg/dL (ref 6–23)
BUN: 8 mg/dL (ref 6–23)
CO2: 28 mEq/L (ref 19–32)
Calcium: 8 mg/dL — ABNORMAL LOW (ref 8.4–10.5)
Calcium: 8.5 mg/dL (ref 8.4–10.5)
Calcium: 8.7 mg/dL (ref 8.4–10.5)
Creatinine, Ser: 0.69 mg/dL (ref 0.4–1.2)
Creatinine, Ser: 0.83 mg/dL (ref 0.4–1.2)
Creatinine, Ser: 1.06 mg/dL (ref 0.4–1.2)
GFR calc Af Amer: >60 mL/min (ref >60)
GFR calc Af Amer: >60 mL/min (ref >60)
GFR calc non Af Amer: >60 mL/min (ref >60)
GFR calc non Af Amer: >60 mL/min (ref >60)
Glucose, Bld: 121 mg/dL — ABNORMAL HIGH (ref 70–99)
Glucose, Bld: 135 mg/dL — ABNORMAL HIGH (ref 70–99)
Glucose, Bld: 164 mg/dL — ABNORMAL HIGH (ref 70–99)
Sodium: 140 mEq/L (ref 135–145)

## 2011-03-04 LAB — URINALYSIS, ROUTINE W REFLEX MICROSCOPIC
Hgb urine dipstick: NEGATIVE
Nitrite: NEGATIVE
Protein, ur: NEGATIVE mg/dL
Specific Gravity, Urine: 1.024 (ref 1.005–1.030)
Urobilinogen, UA: 0.2 mg/dL (ref 0.0–1.0)

## 2011-03-04 LAB — URINALYSIS, MICROSCOPIC ONLY
Glucose, UA: NEGATIVE mg/dL
Hgb urine dipstick: NEGATIVE
Leukocytes, UA: NEGATIVE
Protein, ur: NEGATIVE mg/dL
Specific Gravity, Urine: 1.028 (ref 1.005–1.030)

## 2011-03-04 LAB — COMPREHENSIVE METABOLIC PANEL
Albumin: 4 g/dL (ref 3.5–5.2)
Alkaline Phosphatase: 82 U/L (ref 39–117)
BUN: 13 mg/dL (ref 6–23)
Calcium: 9.5 mg/dL (ref 8.4–10.5)
Creatinine, Ser: 0.83 mg/dL (ref 0.4–1.2)
Glucose, Bld: 104 mg/dL — ABNORMAL HIGH (ref 70–99)
Potassium: 4.2 mEq/L (ref 3.5–5.1)
Total Protein: 7.7 g/dL (ref 6.0–8.3)

## 2011-03-04 LAB — URINE MICROSCOPIC-ADD ON

## 2011-03-04 LAB — CBC
HCT: 42.5 % (ref 36.0–46.0)
Hemoglobin: 14.5 g/dL (ref 12.0–15.0)
MCHC: 34 g/dL (ref 30.0–36.0)
Platelets: 225 10*3/uL (ref 150–400)
RDW: 14.3 % (ref 11.5–15.5)

## 2011-03-04 LAB — TYPE AND SCREEN: ABO/RH(D): A POS

## 2011-03-04 LAB — PROTIME-INR
INR: 1.03 (ref 0.00–1.49)
Prothrombin Time: 13.4 seconds (ref 11.6–15.2)

## 2011-03-04 LAB — APTT: aPTT: 31 seconds (ref 24–37)

## 2011-03-04 LAB — URINE CULTURE: Colony Count: 70000

## 2011-03-05 LAB — TYPE AND SCREEN: Antibody Screen: NEGATIVE

## 2011-03-05 LAB — CBC
Hemoglobin: 13.9 g/dL (ref 12.0–15.0)
RBC: 4.96 MIL/uL (ref 3.87–5.11)
WBC: 7.9 10*3/uL (ref 4.0–10.5)

## 2011-03-05 LAB — COMPREHENSIVE METABOLIC PANEL
ALT: 15 U/L (ref 0–35)
AST: 21 U/L (ref 0–37)
Alkaline Phosphatase: 72 U/L (ref 39–117)
CO2: 24 mEq/L (ref 19–32)
Chloride: 105 mEq/L (ref 96–112)
GFR calc Af Amer: >60 mL/min (ref >60)
GFR calc non Af Amer: >60 mL/min (ref >60)
Glucose, Bld: 93 mg/dL (ref 70–99)
Sodium: 140 mEq/L (ref 135–145)
Total Bilirubin: 0.7 mg/dL (ref 0.3–1.2)

## 2011-03-05 LAB — URINALYSIS, ROUTINE W REFLEX MICROSCOPIC
Bilirubin Urine: NEGATIVE
Nitrite: NEGATIVE
Specific Gravity, Urine: 1.011 (ref 1.005–1.030)
Urobilinogen, UA: 0.2 mg/dL (ref 0.0–1.0)

## 2011-03-05 LAB — URINE MICROSCOPIC-ADD ON

## 2011-03-05 LAB — APTT: aPTT: 32 seconds (ref 24–37)

## 2011-03-05 LAB — ABO/RH: ABO/RH(D): A POS

## 2011-03-11 ENCOUNTER — Other Ambulatory Visit: Payer: Self-pay | Admitting: Family Medicine

## 2011-03-11 DIAGNOSIS — Z1231 Encounter for screening mammogram for malignant neoplasm of breast: Secondary | ICD-10-CM

## 2011-03-18 LAB — BASIC METABOLIC PANEL
CO2: 26 mEq/L (ref 19–32)
Calcium: 9 mg/dL (ref 8.4–10.5)
GFR calc Af Amer: >60 mL/min (ref >60)
Sodium: 139 mEq/L (ref 135–145)

## 2011-03-18 LAB — POCT HEMOGLOBIN-HEMACUE: Hemoglobin: 13.2 g/dL (ref 12.0–15.0)

## 2011-03-27 ENCOUNTER — Encounter: Payer: Self-pay | Admitting: *Deleted

## 2011-03-27 ENCOUNTER — Other Ambulatory Visit (INDEPENDENT_AMBULATORY_CARE_PROVIDER_SITE_OTHER): Payer: PRIVATE HEALTH INSURANCE | Admitting: Family Medicine

## 2011-03-27 DIAGNOSIS — Z13220 Encounter for screening for lipoid disorders: Secondary | ICD-10-CM

## 2011-03-27 DIAGNOSIS — Z0000 Encounter for general adult medical examination without abnormal findings: Secondary | ICD-10-CM

## 2011-03-27 LAB — CBC WITH DIFFERENTIAL/PLATELET
Basophils Relative: 0.6 % (ref 0.0–3.0)
Eosinophils Relative: 2.6 % (ref 0.0–5.0)
HCT: 45.8 % (ref 36.0–46.0)
Hemoglobin: 15.4 g/dL — ABNORMAL HIGH (ref 12.0–15.0)
Lymphocytes Relative: 30.9 % (ref 12.0–46.0)
Lymphs Abs: 2.1 10*3/uL (ref 0.7–4.0)
Monocytes Relative: 10.6 % (ref 3.0–12.0)
Neutro Abs: 3.8 10*3/uL (ref 1.4–7.7)
RBC: 5.46 Mil/uL — ABNORMAL HIGH (ref 3.87–5.11)
RDW: 15.5 % — ABNORMAL HIGH (ref 11.5–14.6)
WBC: 6.9 10*3/uL (ref 4.5–10.5)

## 2011-03-27 LAB — LDL CHOLESTEROL, DIRECT: Direct LDL: 186.8 mg/dL

## 2011-03-27 LAB — BASIC METABOLIC PANEL
GFR: 87.52 mL/min (ref >60.00)
Glucose, Bld: 108 mg/dL — ABNORMAL HIGH (ref 70–99)
Potassium: 4.1 mEq/L (ref 3.5–5.1)
Sodium: 141 mEq/L (ref 135–145)

## 2011-03-27 LAB — HEPATIC FUNCTION PANEL
ALT: 22 U/L (ref 0–35)
AST: 24 U/L (ref 0–37)
Albumin: 4 g/dL (ref 3.5–5.2)
Alkaline Phosphatase: 79 U/L (ref 39–117)
Total Protein: 7.9 g/dL (ref 6.0–8.3)

## 2011-03-27 LAB — POCT URINALYSIS DIPSTICK
Glucose, UA: NEGATIVE
Ketones, UA: NEGATIVE
Leukocytes, UA: NEGATIVE
Spec Grav, UA: 1.025
Urobilinogen, UA: 0.2

## 2011-03-27 LAB — LIPID PANEL: Cholesterol: 276 mg/dL — ABNORMAL HIGH (ref 0–200)

## 2011-03-31 ENCOUNTER — Ambulatory Visit
Admission: RE | Admit: 2011-03-31 | Discharge: 2011-03-31 | Disposition: A | Discharge status: Home / Self Care | Payer: PRIVATE HEALTH INSURANCE | Source: Ambulatory Visit | Attending: Family Medicine | Admitting: Family Medicine

## 2011-03-31 DIAGNOSIS — Z1231 Encounter for screening mammogram for malignant neoplasm of breast: Secondary | ICD-10-CM

## 2011-04-03 ENCOUNTER — Encounter: Payer: Self-pay | Admitting: Family Medicine

## 2011-04-07 ENCOUNTER — Ambulatory Visit (INDEPENDENT_AMBULATORY_CARE_PROVIDER_SITE_OTHER): Payer: PRIVATE HEALTH INSURANCE | Admitting: Family Medicine

## 2011-04-07 ENCOUNTER — Encounter: Payer: Self-pay | Admitting: Family Medicine

## 2011-04-07 DIAGNOSIS — E7800 Pure hypercholesterolemia, unspecified: Secondary | ICD-10-CM

## 2011-04-07 DIAGNOSIS — J45909 Unspecified asthma, uncomplicated: Secondary | ICD-10-CM

## 2011-04-07 DIAGNOSIS — F329 Major depressive disorder, single episode, unspecified: Secondary | ICD-10-CM

## 2011-04-07 DIAGNOSIS — I10 Essential (primary) hypertension: Secondary | ICD-10-CM

## 2011-04-07 MED ORDER — CARVEDILOL 25 MG PO TABS: 25.0000 mg | ORAL_TABLET | Freq: Two times a day (BID) | ORAL | Status: DC

## 2011-04-07 MED ORDER — ALBUTEROL SULFATE HFA 108 (90 BASE) MCG/ACT IN AERS
2.0000 | INHALATION_SPRAY | Freq: Four times a day (QID) | RESPIRATORY_TRACT | Status: DC | PRN
Start: 2011-04-07 — End: 2012-07-29

## 2011-04-07 MED ORDER — TELMISARTAN 80 MG PO TABS: 80.0000 mg | ORAL_TABLET | Freq: Every day | ORAL | Status: DC

## 2011-04-07 MED ORDER — POTASSIUM CHLORIDE CRYS ER 20 MEQ PO TBCR: 20.0000 meq | EXTENDED_RELEASE_TABLET | Freq: Every day | ORAL | Status: DC

## 2011-04-07 MED ORDER — FUROSEMIDE 20 MG PO TABS: 20.0000 mg | ORAL_TABLET | Freq: Every day | ORAL | Status: DC

## 2011-04-07 MED ORDER — PAROXETINE HCL 40 MG PO TABS
40.0000 mg | ORAL_TABLET | ORAL | Status: DC
Start: 2011-04-07 — End: 2012-05-06

## 2011-04-07 MED ORDER — FUROSEMIDE 40 MG PO TABS: 40.0000 mg | ORAL_TABLET | Freq: Every day | ORAL | Status: DC

## 2011-04-07 MED ORDER — SIMVASTATIN 20 MG PO TABS: 20.0000 mg | ORAL_TABLET | Freq: Every day | ORAL | Status: DC

## 2011-04-07 NOTE — Progress Notes (Signed)
  Subjective:    Patient ID: Tamara [name], female    DOB: [birth year], 60 y.o.   MRN: [id number]  HPI  [name] is a 60 year old single female Who comes in today for evaluation of multiple medical problems.  She has a history of hyperlipidemia, for which he takes simvastatin 20 mg nightly along with a baby aspirin.  Lipids are goal.  She has a history of depression, for which he takes Paxil 40 mg daily.  She has a history of hypertension, for which he takes Lasix 20 mg at noon, and 40 mg in the morning one, potassium tablet daily, and my card is 80 mg daily.  BP 124/90.  She has history of intermittent asthma with for which he uses albuterol p.r.n.  She also has a chronic fungal infection.  She recently went to a dermatologist, Dr. Margo Aye recommended some antifungal medication.  However, the [employer] nurse called her and told her there may be a potential drug interaction between her lipid-lowering medications.  In the antifungal medications.  She was advised to call our dermatologists to discuss this area.  Her biggest overall medical problem is her weight.  She is currently up to 302 pounds.  I recommend she talked to the doctors about the gastric bypass surgery    Review of Systems General and metabolic review of systems otherwise negative    Objective:   Physical Exam  Constitutional: She appears well-developed and well-nourished.       obese  HENT:  Head: Normocephalic and atraumatic.  Right Ear: External ear normal.  Left Ear: External ear normal.  Nose: Nose normal.  Mouth/Throat: Oropharynx is clear and moist.  Eyes: EOM are normal. Pupils are equal, round, and reactive to light.  Neck: Normal range of motion. Neck supple. No thyromegaly present.  Cardiovascular: Normal rate, regular rhythm, normal heart sounds and intact distal pulses.  Exam reveals no gallop and no friction rub.   No murmur heard. Pulmonary/Chest: Effort normal and breath sounds normal.  Abdominal: Soft.  Bowel sounds are normal. She exhibits no distension and no mass. There is no tenderness. There is no rebound.       Massive panniculus  Musculoskeletal: Normal range of motion.  Lymphadenopathy:    She has no cervical adenopathy.  Neurological: She is alert. She has normal reflexes. No cranial nerve deficit. She exhibits normal muscle tone. Coordination normal.  Skin: Skin is warm and dry.  Psychiatric: She has a normal mood and affect. Her behavior is normal. Judgment and thought content normal.          Assessment & Plan:  Morbid obesity,,,,,,,,,,,,, recommend she go and consult about the gastric bypass surgery.  Hyperlipidemia.  Continue current medication.  Depression.  Continue Paxil 40 daily.  Hypertension.  Continue above medications.  Intermittent asthma.  Albuterol p.r.n.  Final infection call dermatologist to discuss potential side effects with the antifungal medication and your cholesterol under a

## 2011-04-07 NOTE — Patient Instructions (Signed)
Continue current medications.  C. The general surgeons at [facility].  Follow-up in one year or sooner if any problems.  Call the dermatologist about the potential drug interactions.  If they feel strongly U. She could continue to take the antifungal medication then let's stop the Zocor for a couple months

## 2011-04-15 NOTE — Procedures (Signed)
NAME:  Tamara [name], CORDNER               ACCOUNT NO.:  [id number]   MEDICAL RECORD NO.:  [id number]          PATIENT TYPE:  OUT   LOCATION:  SLEEP CENTER                 FACILITY[facility]  [facility]   PHYSICIAN:  Clinton D. Maple Hudson, MD, FCCP, FACPDATE OF BIRTH:  10[birth year]08[birth year]1950   DATE OF STUDY:  12/22/2008                            NOCTURNAL POLYSOMNOGRAM   REFERRING PHYSICIAN:  Garnetta Buddy, M.D.   REFERRING PHYSICIAN:  Garnetta Buddy, MD   INDICATION FOR STUDY:  Hypersomnia with sleep apnea.   EPWORTH SLEEPINESS SCORE:  Epworth sleepiness score 4/24.  BMI 47.6.  Weight 295 pounds.  Height 66 inches.  Neck 18 inches.   MEDICATIONS:  Home medications are charted and reviewed.   A diagnostic NPSG on June 24, 2008, recorded an AHI of 57 per hour.  CPAP titration is now requested.   SLEEP ARCHITECTURE:  Total sleep time 325 minutes with sleep efficiency  70.3%.  Stage I was 7.7%.  Stage II 92.3%.  Stage III and REM were  absent.  Sleep latency 67 minutes.  Awake after sleep onset 69.5  minutes.  Arousal index 15.7.   BEDTIME MEDICATION:  Tylenol PM, 2 tablets at bedtime.   RESPIRATORY DATA:  CPAP titration protocol.  CPAP was titrated to 20  CWP, AHI 0 per hour.  She chose a medium ResMed Quattro full face mask  with heated humidifier.   OXYGEN DATA:  Snoring was prevented by CPAP and mean oxygen saturation  held 95.5% on room air.   CARDIAC DATA:  Sinus rhythm with frequent narrow ectopics, probably  PACs.   MOVEMENT/PARASOMNIA:  Occasional limb jerk without sleep disturbance.  No bathroom trips.   IMPRESSION/RECOMMENDATION:  1. Successful continuous positive airway pressure titration to 20      centimeters of water pressure, apnea-hypopnea index 0 per hour.      She chose a medium ResMed Quattro full face mask with heated      humidifier.  2. Baseline diagnostic nocturnal polysomnogram on June 24, 2008, had      recorded apnea-hypopnea index 57 per hour.      Clinton D. Maple Hudson, MD,  [facility], FACP  Diplomate, Biomedical engineer of Sleep Medicine  Electronically Signed     CDY/MEDQ  D:  12/30/2008 12:39:10  T:  12/31/2008 01:15:55  Job:  [id number]

## 2011-04-15 NOTE — Procedures (Signed)
NAME:  Tamara [name], Tamara [name]               ACCOUNT NO.:  [id number]   MEDICAL RECORD NO.:  [id number]          PATIENT TYPE:  OUT   LOCATION:  SLEEP CENTER                 FACILITY[facility]  [facility]   PHYSICIAN:  Clinton D. Maple Hudson, MD, FCCP, FACPDATE OF BIRTH:  [birth year] 15, 1951   DATE OF STUDY:  06/24/2008                            NOCTURNAL POLYSOMNOGRAM   REFERRING PHYSICIAN:   REFERRING PHYSICIAN:  Garnetta Buddy, M.D.   INDICATION FOR STUDY:  Hypersomnia with sleep apnea.   EPWORTH SLEEPINESS SCORE:  1/24.  BMI 50.8.  Weight 296 pounds.  Height  64 inches.   HOME MEDICATIONS:  Charted and reviewed.   SLEEP ARCHITECTURE:  Total sleep time 312 minutes with sleep efficiency  81.6%.  Stage I was 9.1%.  Stage II 89.9%.  Stage III 0.6%.  REM 0.3%.  Sleep latency 51 minutes.  REM latency 252 minutes.  Awake after sleep  onset 19.5 minutes.  Arousal index 35.9, reflecting EEG arousal.  Bedtime medication included Tylenol P.M. and paroxetine.   RESPIRATORY DATA:  Apnea-hypopnea index (AHI) 57 per hour.  There were  297 events scored, including 146 obstructive apneas, 2 mixed apneas, and  149 hypopneas.  Events were somewhat more common while supine but  significant in all sleep positions.  REM AHI 0.  The technician  indicated there was not enough cumulative sleep time during the first  part of the night by protocol to permit initiation of split CPAP  titration protocol on this study.   OXYGEN DATA:  Moderately loud snoring with oxygen desaturation to a  nadir of 84%.  Mean oxygen saturation through the study 94% on room air.   CARDIAC DATA:  Normal sinus rhythm.   MOVEMENT-PARASOMNIA:  Occasional limb jerks without arousal.  No  significant disturbances sleep-associated with limb jerks.   IMPRESSIONS-RECOMMENDATIONS:  1. Severe obstructive sleep apnea/hypopnea syndrome, apnea-hypopnea      index (AHI) 57 per hour with nonpositional events, moderately loud      snoring and oxygen desaturation to a  nadir of 84%.  2. The technician reported insufficiency time by required protocols in      accumulated sleep during the first component of the night to permit      initiation of CPAP by split protocol.  Scores in this range should      strongly be      considered for CPAP therapy.  Consider return for CPAP titration or      evaluate for alternative management as appropriate.      Clinton D. Maple Hudson, MD, [facility], FACP  Diplomate, Biomedical engineer of Sleep Medicine  Electronically Signed     CDY/MEDQ  D:  07/01/2008 10:39:35  T:  07/01/2008 11:10:39  Job:  [id number]

## 2011-04-15 NOTE — Op Note (Signed)
Tamara [name], Tamara [name] NO.:  [id number]   MEDICAL RECORD NO.:  [id number]          PATIENT TYPE:  AMB   LOCATION:  DSC                          FACILITY:  [facility]   PHYSICIAN:  Loreta Ave, M.D. DATE OF BIRTH:  [birth year]   DATE OF PROCEDURE:  DATE OF DISCHARGE:                               OPERATIVE REPORT   PREOPERATIVE DIAGNOSES:  Right knee medial and lateral meniscus tears.  Tricompartmental degenerative arthritis.   POSTOPERATIVE DIAGNOSES:  Right knee medial and lateral meniscus tears.  Tricompartmental degenerative arthritis with grade 3 changes in all 3  compartments, most marked in the patellofemoral.  Also, a large  osteochondral loose body in the notch.   PROCEDURE:  Right knee exam under anesthesia, arthroscopy, partial  medial and lateral meniscectomy.  Removal of one large osteochondral  loose body, numerous small chondral loose bodies.  Tricompartmental  chondroplasty.   SURGEON:  Loreta Ave, MD   ASSISTANT:  Genene Churn. Barry Dienes, [place]   ANESTHESIA:  General.   BLOOD LOSS:  Minimal.   SPECIMENS:  None.   CULTURES:  None.   COMPLICATIONS:  None.   PROCEDURE:  Soft compressive.   TOURNIQUET:  Not employed.   PROCEDURE:  The patient brought to the operating room, placed on  operating table in supine position.  After adequate anesthesia had been  obtained, knee examined.  Good motion, good stability.  Leg holder  applied.  Leg prepped and draped in the usual sterile fashion.  Three  portals created, one superolateral, one each medial and lateral  parapatellar.  Inflow catheter induced.  The standard arthroscope was  induced and inspected.  Reasonable patellofemoral tracking.  Extensive  grade 3, approaching grade 4, changes throughout the patella, a lesser  extent on the trochlea.  Chondroplasty to a stable surface.  Numerous  small loose bodies throughout the knee removed.  Hypertrophic synovitis  debrided.  In the notch, there was  a partially tethered loose body  between the anterior and posterior cruciate ligaments.  This was freed  up and removed in its entirety.  This was a good 6-7 mm osteochondral  loose body.  Cruciate ligaments were intact.  Medial compartment  extensive grade 3 changes.  Marked complex tearing almost entire  meniscus.  Most of meniscus removed to a stable rim, tapered into  remaining meniscus leaving a little in the back and little in the front.  Laterally, complex tearing saucerized out leaving a fair amount of  meniscus behind at completion.  Grade 2 changes in that compartment  debrided.  The entire knee examined to be sure all loose  fragments removed.  Instruments and fluid removed.  Portals and knee  injected with Marcaine.  The portal was closed with 4-0 nylon.  A  sterile compressive dressing applied.  Anesthesia reversed.  Brought to  the recovery room.  Tolerated surgery well.  No complications.      Loreta Ave, M.D.  Electronically Signed     DFM/MEDQ  D:  01/18/2009  T:  01/18/2009  Job:  [id number]

## 2011-04-18 NOTE — Op Note (Signed)
[place]  Patient:    Tamara [name], Tamara [name] Visit Number: [id number] MRN: [id number]          Service Type: DSU Location: [facility] Attending Physician:  Kendell Bane Dictated by:   Lowell Bouton, M.D. Proc. Date: 06/22/02 Admit Date:  06/22/2002 Discharge Date: 06/22/2002                             Operative Report  PREOPERATIVE DIAGNOSIS:  Right carpal tunnel syndrome.  POSTOPERATIVE DIAGNOSIS:  Right carpal tunnel syndrome.  PROCEDURE:  Decompression of median nerve, right carpal tunnel.  SURGEON:  Lowell Bouton, M.D.  ANESTHESIA:  Half percent Marcaine local with sedation.  OPERATIVE FINDINGS:  The patient has no masses present in the carpal canal. There was significant compression on the median nerve and motor branch was intact.  DESCRIPTION OF PROCEDURE:  Under 0.5% Marcaine local anesthesia with a tourniquet on the right arm, the right hand was prepped and draped in the usual fashion.  After exsanguinating the limb, the tourniquet was inflated to 250 mmHg.  A 3 cm longitudinal incision was made in the palm just ulnar to the thenar crease.  Sharp dissection was carried through the subcutaneous tissue. Blunt dissection was carried through the superficial palmar fascia down to the transverse carpal ligament.  A hemostat was then placed in the carpal canal up against the hook of the hamate, and the transverse carpal ligament was divided sharply on the ulnar border of the median nerve.  The proximal end of the ligament was divided with the scissors after dissecting the nerve away from the under surface of the ligament.  The carpal canal was then palpated and was found to be adequately decompressed.  The nerve was examined and motor branch was identified.  The wound was then irrigated with saline and the skin was closed with 4-0 nylon sutures.  Sterile dressings were applied followed by a volar wrist  splint.  The patient tolerated the procedure well and went to the recovery room in awake, stable, and in good condition. Dictated by:   Lowell Bouton, M.D. Attending Physician:  Kendell Bane DD:  06/23/02 TD:  06/26/02 Job: [id number]

## 2011-05-07 ENCOUNTER — Telehealth: Payer: Self-pay | Admitting: Family Medicine

## 2011-05-07 NOTE — Telephone Encounter (Signed)
Triage vm-----was seen a few weeks ago. Checking on status of forms to be filled out to be sent to [facility].

## 2011-05-07 NOTE — Telephone Encounter (Signed)
Spoke with patient and she will fax over new paperwork to be filled out

## 2011-05-08 NOTE — Telephone Encounter (Signed)
Form filled out and letter faxed

## 2011-05-11 IMAGING — CR DG CHEST 1V PORT
1 series · 1 of 1 positions shown · non-contrast
Comparison: 10/17/2009

CLINICAL DATA: PICC line placement.

PORTABLE CHEST - 1 VIEW

[view not recorded]
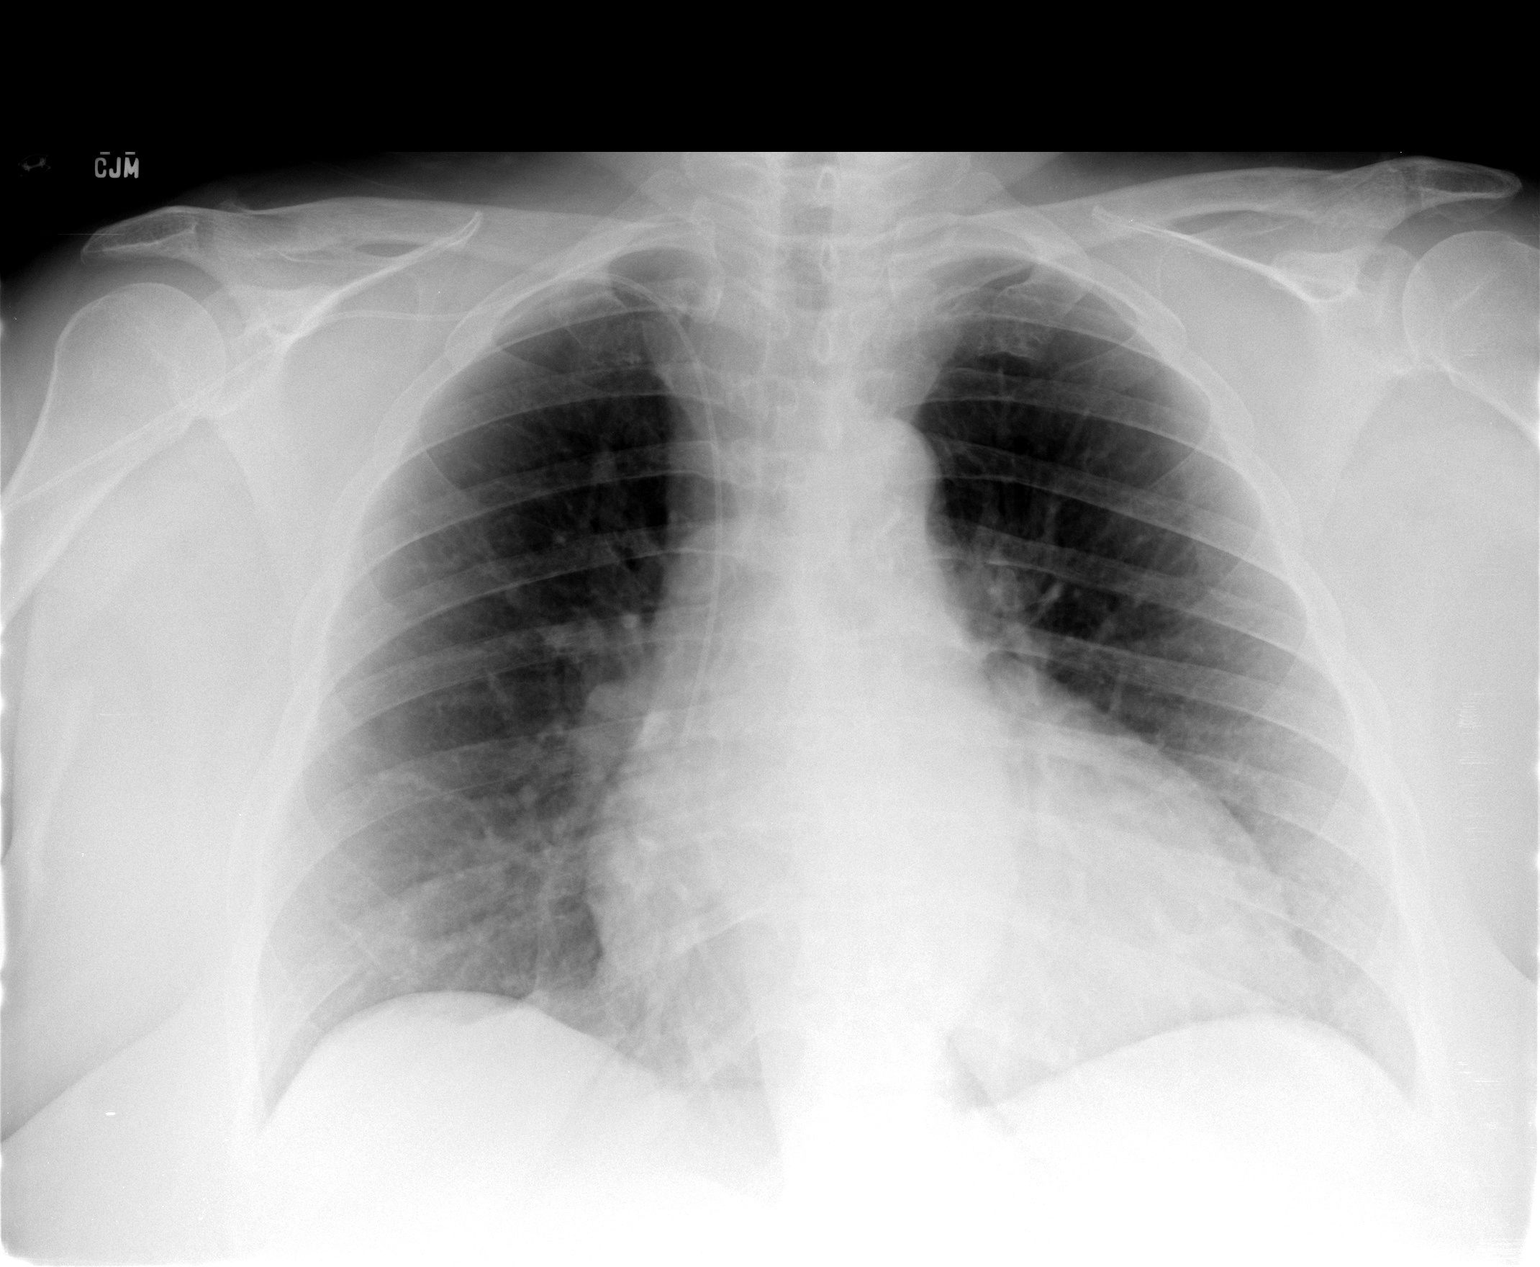

[1 of 1 positions shown; findings below may reference images not displayed]

FINDINGS: Right PICC line is in place with the tip at the
cavoatrial junction.

There is cardiomegaly.  Lungs are clear.  No effusions or acute
bony abnormality.
IMPRESSION: Right PICC line tip cavoatrial junction.

## 2011-06-06 ENCOUNTER — Ambulatory Visit (INDEPENDENT_AMBULATORY_CARE_PROVIDER_SITE_OTHER): Payer: BC Managed Care – PPO | Admitting: General Surgery

## 2011-06-06 ENCOUNTER — Encounter (INDEPENDENT_AMBULATORY_CARE_PROVIDER_SITE_OTHER): Payer: Self-pay | Admitting: General Surgery

## 2011-06-06 NOTE — Progress Notes (Signed)
Subjective:     Patient ID: Tamara [name], female   DOB: 09-21-1951, 60 y.o.   MRN: [id number]    There were no vitals taken for this visit.    HPI This is a 60 year old female patient of Dr. Alonza Smoker referred for consideration for surgical treatment for morbid obesity. She gives an essentially lifelong history of obesity. She was athletic in high school and maintained a somewhat normal weight. After high school however and with her children she experienced progressive weight gain. Over the years she has been through innumerable efforts at diet and exercise with some temporary success but then will experience progressive weight gain. Her current weight of 314 pounds and BMI of 50.4 is essentially her maximum. Her weight currently is interfering with her life and a number of ways. She has very severe arthritis and chronic joint pain which limits her mobility. She has had one knee replaced and requires another knee replacement. He is very concerned about her long-term health. She has developed hypertension and obstructive sleep apnea. She gets short of breath with exertion and is having difficulty completing routine daily activities. She has been torn initial information seminar. She is open the surgical options at this point. Past Medical History  Diagnosis Date  . Depression   . Hypertension   . Obese   . Cellulitis and abscess of leg   . Arthritis   . Asthma   . Sleep apnea, obstructive    Past Surgical History  Procedure Date  . Joint replacement     Knee   Current Outpatient Prescriptions  Medication Sig Dispense Refill  . albuterol (PROVENTIL HFA;VENTOLIN HFA) 108 (90 BASE) MCG/ACT inhaler Inhale 2 puffs into the lungs every 6 (six) hours as needed.  18 g  2  . aspirin 81 MG tablet Take 81 mg by mouth daily.        . carvedilol (COREG) 25 MG tablet Take 1 tablet (25 mg total) by mouth 2 (two) times daily with a meal.  200 tablet  3  . ceFAZolin (ANCEF) 1 G injection Inject into the  muscle.        . divalproex (DEPAKOTE) 250 MG 24 hr tablet Take 250 mg by mouth daily.        . DUAC gel       . fish oil-omega-3 fatty acids 1000 MG capsule Take 2 g by mouth daily.        . fluconazole (DIFLUCAN) 150 MG tablet       . furosemide (LASIX) 20 MG tablet Take 1 tablet (20 mg total) by mouth daily.  100 tablet  3  . furosemide (LASIX) 40 MG tablet Take 1 tablet (40 mg total) by mouth daily.  100 tablet  3  . HYDROcodone-acetaminophen (NORCO) 5-325 MG per tablet Take 1 tablet by mouth every 12 (twelve) hours.        . Ibuprofen-Diphenhydramine HCl (ADVIL PM) 200-25 MG CAPS Take 2 capsules by mouth at bedtime.        Marland Kitchen ketoconazole (NIZORAL) 200 MG tablet Take 200 mg by mouth daily.        . miconazole (MICOTIN) 2 % powder Apply topically as needed.        . NON FORMULARY Nizoral/cutivate 1:1 daily      . PARoxetine (PAXIL) 40 MG tablet Take 1 tablet (40 mg total) by mouth every morning.  100 tablet  3  . potassium chloride SA (K-DUR,KLOR-CON) 20 MEQ tablet Take 1 tablet (20  mEq total) by mouth daily.  100 tablet  3  . pseudoephedrine-guaifenesin (MUCINEX D) 60-600 MG per tablet Take 1 tablet by mouth every 12 (twelve) hours.        . simvastatin (ZOCOR) 20 MG tablet Take 1 tablet (20 mg total) by mouth at bedtime.  100 tablet  3  . telmisartan (MICARDIS) 80 MG tablet Take 1 tablet (80 mg total) by mouth daily.  100 tablet  3  . VITAMIN D, CHOLECALCIFEROL, PO Take by mouth.        . vitamin E 100 UNIT capsule Take 100 Units by mouth daily.         Review of Systems  Constitutional: Positive for activity change and fatigue. Negative for fever.  HENT: Positive for hearing loss. Negative for ear pain and trouble swallowing.   Eyes: Negative for pain and visual disturbance.  Respiratory: Positive for apnea, cough, shortness of breath and wheezing. Negative for stridor.   Cardiovascular: Positive for leg swelling. Negative for chest pain and palpitations.  Gastrointestinal: Positive  for constipation. Negative for nausea, vomiting, abdominal pain, diarrhea, blood in stool, abdominal distention, anal bleeding and rectal pain.  Genitourinary: Negative for frequency and difficulty urinating.  Musculoskeletal: Positive for myalgias, back pain, joint swelling, arthralgias and gait problem.  Skin: Positive for rash.  Neurological: Negative for seizures, syncope and headaches.  Psychiatric/Behavioral: Negative for behavioral problems.       Objective:   Physical Exam Gen.: Morbidly obese Caucasian female in no distress Skin there is some mild petechial rash over the proximal thighs. There is significant chronic fungal type of rash beneath her large pannus. HEENT: No palpable neck masses or thyromegaly. Oropharynx is clear. Sclerae are nonicteric. Pupils equal round and reactive. Lymph nodes: No cervical supraclavicular or inguinal nodes palpable Lungs no increased work of breathing. No wheezing. Breath sounds are clear and equal Cardiovascular: S1-S2 normal without murmurs. Regular rate and rhythm. 1+ lower extremity edema. No JVD. Abdomen: Normal bowel sounds. Obese. Soft and nontender. No discernible masses organomegaly. She has a large pannus as described above. Extremities: 1+ edema. Well-healed right knee incision. Neurologic: Alert and fully oriented. Motor and sensory exams grossly normal.    Assessment:     60 year old female with progressive morbid obesity over a number of years. Her obesity is seriously threatening her health. She has developed numerous comorbidities including severe arthritis, obstructive sleep apnea, hypertension, chronic lower extremity edema and cellulitis. She has significant potential benefit if we can get her weight down to a lower level.      Plan:     The patient arrived open to surgical options. We had a long discussion report guarding lap band versus gastric bypass. We discussed each procedure and pros and cons in detail. She would  prefer gastric bypass for a number of reasons. We discussed the nature of the procedure and expected recovery and results. We discussed complications including anesthetic complications bleeding infection and pulmonary embolus. Long-term complications of ulcer stenosis internal hernia weight regain and metabolic problems were discussed. She was given a complete consent form to review. She would like to pursue gastric bypass.  I'm recommending a preoperative weight loss plan to get her weight below 300 pounds. This would be a prerequisite for surgery. We will go ahead with psychologic and nutrition evaluation. We'll obtain gallbladder ultrasound bone density H. pylori testing routine lab work and x-rays and we'll see her back following these studies.

## 2011-06-06 NOTE — Patient Instructions (Signed)
Returns following planned studies. Weight goal of less than 300 pounds prior to surgery.

## 2011-06-09 ENCOUNTER — Other Ambulatory Visit (INDEPENDENT_AMBULATORY_CARE_PROVIDER_SITE_OTHER): Payer: Self-pay | Admitting: General Surgery

## 2011-06-24 ENCOUNTER — Ambulatory Visit (HOSPITAL_COMMUNITY): Payer: PRIVATE HEALTH INSURANCE

## 2011-06-24 ENCOUNTER — Other Ambulatory Visit (HOSPITAL_COMMUNITY): Payer: PRIVATE HEALTH INSURANCE

## 2011-07-16 ENCOUNTER — Ambulatory Visit (HOSPITAL_COMMUNITY)
Admission: RE | Admit: 2011-07-16 | Discharge: 2011-07-16 | Disposition: A | Discharge status: Home / Self Care | Payer: BC Managed Care – PPO | Source: Ambulatory Visit | Attending: General Surgery | Admitting: General Surgery

## 2011-07-16 DIAGNOSIS — Z01818 Encounter for other preprocedural examination: Secondary | ICD-10-CM | POA: Insufficient documentation

## 2011-07-23 ENCOUNTER — Encounter: Payer: Self-pay | Admitting: *Deleted

## 2011-07-23 ENCOUNTER — Encounter: Payer: BC Managed Care – PPO | Attending: General Surgery | Admitting: *Deleted

## 2011-07-23 DIAGNOSIS — Z01818 Encounter for other preprocedural examination: Secondary | ICD-10-CM | POA: Insufficient documentation

## 2011-07-23 DIAGNOSIS — Z713 Dietary counseling and surveillance: Secondary | ICD-10-CM | POA: Insufficient documentation

## 2011-07-23 NOTE — Progress Notes (Signed)
  Patient was seen on 07/23/2011 for Pre-Operative 07/23/2011 Nutrition Assessment. Assessment and letter of approval faxed to [facility] Program coordinator on 07/23/11.    Handouts given during visit include:  Pre-Op Goals Handout  Patient to call for Pre-Op and Post-Op Nutrition Education at the [facility] when surgery is scheduled.

## 2011-07-23 NOTE — Patient Instructions (Addendum)
   Follow Pre-Op Nutrition Goals to prepare for Gastric Bypass Surgery.   Call the [facility] at [phone] once you have been given your surgery date to enrolled in the Pre-Op Nutrition Class. You will need to attend this nutrition class 3-4 weeks prior to your surgery. 

## 2011-08-02 ENCOUNTER — Other Ambulatory Visit (INDEPENDENT_AMBULATORY_CARE_PROVIDER_SITE_OTHER): Payer: Self-pay | Admitting: General Surgery

## 2011-08-02 LAB — COMPREHENSIVE METABOLIC PANEL
ALT: 18 U/L (ref 0–35)
AST: 17 U/L (ref 0–37)
Albumin: 3.8 g/dL (ref 3.5–5.2)
Alkaline Phosphatase: 68 U/L (ref 39–117)
Glucose, Bld: 111 mg/dL — ABNORMAL HIGH (ref 70–99)
Potassium: 4.5 mEq/L (ref 3.5–5.3)
Sodium: 141 mEq/L (ref 135–145)
Total Bilirubin: 0.5 mg/dL (ref 0.3–1.2)
Total Protein: 6.4 g/dL (ref 6.0–8.3)

## 2011-08-02 LAB — TSH: TSH: 2.055 u[IU]/mL (ref 0.350–4.500)

## 2011-08-02 LAB — CBC WITH DIFFERENTIAL/PLATELET
Basophils Absolute: 0 10*3/uL (ref 0.0–0.1)
Basophils Relative: 1 % (ref 0–1)
Eosinophils Absolute: 0.2 10*3/uL (ref 0.0–0.7)
Hemoglobin: 13.7 g/dL (ref 12.0–15.0)
MCH: 27.6 pg (ref 26.0–34.0)
MCHC: 31.4 g/dL (ref 30.0–36.0)
Monocytes Relative: 9 % (ref 3–12)
Neutro Abs: 2.9 10*3/uL (ref 1.7–7.7)
Neutrophils Relative %: 52 % (ref 43–77)
Platelets: 212 10*3/uL (ref 150–400)
RDW: 14.6 % (ref 11.5–15.5)

## 2011-08-02 LAB — LIPID PANEL
LDL Cholesterol: 153 mg/dL — ABNORMAL HIGH (ref 0–99)
Triglycerides: 162 mg/dL — ABNORMAL HIGH (ref <150)
VLDL: 32 mg/dL (ref 0–40)

## 2011-08-08 ENCOUNTER — Other Ambulatory Visit: Payer: Self-pay

## 2011-08-08 ENCOUNTER — Ambulatory Visit (HOSPITAL_COMMUNITY)
Admission: RE | Admit: 2011-08-08 | Discharge: 2011-08-08 | Disposition: A | Discharge status: Home / Self Care | Payer: BC Managed Care – PPO | Source: Ambulatory Visit | Attending: General Surgery | Admitting: General Surgery

## 2011-08-08 DIAGNOSIS — Z13820 Encounter for screening for osteoporosis: Secondary | ICD-10-CM | POA: Insufficient documentation

## 2011-08-08 DIAGNOSIS — K7689 Other specified diseases of liver: Secondary | ICD-10-CM | POA: Insufficient documentation

## 2011-08-08 DIAGNOSIS — I10 Essential (primary) hypertension: Secondary | ICD-10-CM | POA: Insufficient documentation

## 2011-08-08 DIAGNOSIS — Z6841 Body Mass Index (BMI) 40.0 and over, adult: Secondary | ICD-10-CM | POA: Insufficient documentation

## 2011-08-08 DIAGNOSIS — G4733 Obstructive sleep apnea (adult) (pediatric): Secondary | ICD-10-CM | POA: Insufficient documentation

## 2011-08-08 DIAGNOSIS — M129 Arthropathy, unspecified: Secondary | ICD-10-CM | POA: Insufficient documentation

## 2011-08-08 DIAGNOSIS — I517 Cardiomegaly: Secondary | ICD-10-CM | POA: Insufficient documentation

## 2011-11-29 ENCOUNTER — Other Ambulatory Visit: Payer: Self-pay | Admitting: Family Medicine

## 2012-01-09 ENCOUNTER — Telehealth: Payer: Self-pay | Admitting: *Deleted

## 2012-01-09 NOTE — Telephone Encounter (Signed)
Patient is taking Micardis 80 mg and would like to know if there is something cheaper she can try?

## 2012-01-12 MED ORDER — LOSARTAN POTASSIUM 100 MG PO TABS: 100.0000 mg | ORAL_TABLET | Freq: Every day | ORAL | Status: DC

## 2012-01-12 NOTE — Telephone Encounter (Signed)
Yes which her to Cozaar 100 mg dispense 100 tabs directions one by mouth every morning refills x2 and have her monitor her blood pressure daily in the morning for 4 weeks to be sure it stays normal

## 2012-01-12 NOTE — Telephone Encounter (Signed)
Rx sent and Left message on machine for patient. 

## 2012-04-21 ENCOUNTER — Other Ambulatory Visit: Payer: Self-pay | Admitting: Family Medicine

## 2012-04-21 DIAGNOSIS — Z1231 Encounter for screening mammogram for malignant neoplasm of breast: Secondary | ICD-10-CM

## 2012-05-03 ENCOUNTER — Ambulatory Visit
Admission: RE | Admit: 2012-05-03 | Discharge: 2012-05-03 | Disposition: A | Discharge status: Home / Self Care | Payer: BC Managed Care – PPO | Source: Ambulatory Visit | Attending: Family Medicine | Admitting: Family Medicine

## 2012-05-03 DIAGNOSIS — Z1231 Encounter for screening mammogram for malignant neoplasm of breast: Secondary | ICD-10-CM

## 2012-05-06 ENCOUNTER — Other Ambulatory Visit: Payer: Self-pay | Admitting: Family Medicine

## 2012-06-28 ENCOUNTER — Other Ambulatory Visit: Payer: Self-pay | Admitting: Family Medicine

## 2012-07-15 ENCOUNTER — Other Ambulatory Visit (INDEPENDENT_AMBULATORY_CARE_PROVIDER_SITE_OTHER): Payer: BC Managed Care – PPO

## 2012-07-15 DIAGNOSIS — Z0000 Encounter for general adult medical examination without abnormal findings: Secondary | ICD-10-CM

## 2012-07-15 LAB — CBC WITH DIFFERENTIAL/PLATELET
Basophils Absolute: 0 10*3/uL (ref 0.0–0.1)
Basophils Relative: 0.6 % (ref 0.0–3.0)
Eosinophils Absolute: 0.2 10*3/uL (ref 0.0–0.7)
HCT: 40.5 % (ref 36.0–46.0)
Hemoglobin: 13.3 g/dL (ref 12.0–15.0)
Lymphocytes Relative: 25.6 % (ref 12.0–46.0)
Lymphs Abs: 1.4 10*3/uL (ref 0.7–4.0)
MCHC: 32.9 g/dL (ref 30.0–36.0)
MCV: 86.4 fl (ref 78.0–100.0)
Neutro Abs: 3.3 10*3/uL (ref 1.4–7.7)
RBC: 4.69 Mil/uL (ref 3.87–5.11)
RDW: 14.4 % (ref 11.5–14.6)

## 2012-07-15 LAB — POCT URINALYSIS DIPSTICK
Bilirubin, UA: NEGATIVE
Ketones, UA: NEGATIVE
Leukocytes, UA: NEGATIVE
Spec Grav, UA: 1.025
pH, UA: 5.5

## 2012-07-15 LAB — TSH: TSH: 1.3 u[IU]/mL (ref 0.35–5.50)

## 2012-07-15 LAB — BASIC METABOLIC PANEL
CO2: 29 mEq/L (ref 19–32)
Calcium: 9 mg/dL (ref 8.4–10.5)
Chloride: 105 mEq/L (ref 96–112)
Glucose, Bld: 100 mg/dL — ABNORMAL HIGH (ref 70–99)
Potassium: 4.6 mEq/L (ref 3.5–5.1)
Sodium: 140 mEq/L (ref 135–145)

## 2012-07-15 LAB — HEPATIC FUNCTION PANEL
Albumin: 3.6 g/dL (ref 3.5–5.2)
Alkaline Phosphatase: 66 U/L (ref 39–117)
Total Protein: 6.9 g/dL (ref 6.0–8.3)

## 2012-07-15 LAB — LIPID PANEL: Triglycerides: 289 mg/dL — ABNORMAL HIGH (ref 0.0–149.0)

## 2012-07-15 LAB — LDL CHOLESTEROL, DIRECT: Direct LDL: 113.6 mg/dL

## 2012-07-20 ENCOUNTER — Encounter: Payer: BC Managed Care – PPO | Admitting: Family Medicine

## 2012-07-29 ENCOUNTER — Ambulatory Visit (INDEPENDENT_AMBULATORY_CARE_PROVIDER_SITE_OTHER): Payer: BC Managed Care – PPO | Admitting: Family Medicine

## 2012-07-29 ENCOUNTER — Encounter: Payer: Self-pay | Admitting: Family Medicine

## 2012-07-29 VITALS — BP 150/90 | Temp 98.4°F | Ht 66.0 in | Wt 274.0 lb

## 2012-07-29 DIAGNOSIS — I10 Essential (primary) hypertension: Secondary | ICD-10-CM

## 2012-07-29 DIAGNOSIS — J45909 Unspecified asthma, uncomplicated: Secondary | ICD-10-CM

## 2012-07-29 DIAGNOSIS — M1990 Unspecified osteoarthritis, unspecified site: Secondary | ICD-10-CM

## 2012-07-29 DIAGNOSIS — E7800 Pure hypercholesterolemia, unspecified: Secondary | ICD-10-CM

## 2012-07-29 DIAGNOSIS — B372 Candidiasis of skin and nail: Secondary | ICD-10-CM

## 2012-07-29 DIAGNOSIS — E785 Hyperlipidemia, unspecified: Secondary | ICD-10-CM

## 2012-07-29 DIAGNOSIS — F329 Major depressive disorder, single episode, unspecified: Secondary | ICD-10-CM

## 2012-07-29 DIAGNOSIS — G4730 Sleep apnea, unspecified: Secondary | ICD-10-CM

## 2012-07-29 MED ORDER — POTASSIUM CHLORIDE CRYS ER 20 MEQ PO TBCR: 20.0000 meq | EXTENDED_RELEASE_TABLET | Freq: Every day | ORAL | Status: DC

## 2012-07-29 MED ORDER — CARVEDILOL 25 MG PO TABS: 25.0000 mg | ORAL_TABLET | ORAL | Status: DC

## 2012-07-29 MED ORDER — FUROSEMIDE 40 MG PO TABS: 40.0000 mg | ORAL_TABLET | Freq: Every day | ORAL | Status: DC

## 2012-07-29 MED ORDER — ALBUTEROL SULFATE HFA 108 (90 BASE) MCG/ACT IN AERS: 2.0000 | INHALATION_SPRAY | Freq: Four times a day (QID) | RESPIRATORY_TRACT | Status: DC | PRN

## 2012-07-29 MED ORDER — PAROXETINE HCL 40 MG PO TABS: 40.0000 mg | ORAL_TABLET | ORAL | Status: DC

## 2012-07-29 MED ORDER — DIVALPROEX SODIUM ER 250 MG PO TB24: 250.0000 mg | ORAL_TABLET | Freq: Every day | ORAL | Status: DC

## 2012-07-29 MED ORDER — LOSARTAN POTASSIUM 100 MG PO TABS: 100.0000 mg | ORAL_TABLET | Freq: Every day | ORAL | Status: DC

## 2012-07-29 MED ORDER — SIMVASTATIN 20 MG PO TABS: 20.0000 mg | ORAL_TABLET | Freq: Every day | ORAL | Status: DC

## 2012-07-29 MED ORDER — FUROSEMIDE 20 MG PO TABS: 20.0000 mg | ORAL_TABLET | Freq: Every day | ORAL | Status: DC

## 2012-07-29 MED ORDER — TRAMADOL HCL 50 MG PO TABS: 50.0000 mg | ORAL_TABLET | Freq: Three times a day (TID) | ORAL | Status: AC | PRN

## 2012-07-29 NOTE — Progress Notes (Signed)
  Subjective:    Patient ID: Tamara [name], female    DOB: June 25, 1951, 61 y.o.   MRN: [id number]  HPI Tamara [name] is a 61 year old single female nonsmoker who comes in today for general physical examination  She has a history of morbid obesity however she has lost from 302 down to 274 pounds which represents a 28 pound weight loss. She's tenderness via diet and exercise. She states she went to the bariatric clinic but he she couldn't afford that  All her medications reviewed in detail and there've been no changes.  She gets routine eye care, dental care, BSE monthly, and you mammography, screening colonoscopy and GI, tetanus 2007, Pneumovax 2010, information given on shingles vaccine.  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 Margo Aye the dermatologist  She also has hearing loss refer to the [facility] audiology Department   Review of Systems  Constitutional: Negative.   HENT: Negative.   Eyes: Negative.   Respiratory: Negative.   Cardiovascular: Negative.   Gastrointestinal: Negative.   Genitourinary: Negative.   Musculoskeletal: Negative.   Neurological: Negative.   Hematological: Negative.   Psychiatric/Behavioral: Negative.        Objective:   Physical Exam  Constitutional: She appears well-developed and well-nourished.  HENT:  Head: Normocephalic and atraumatic.  Right Ear: External ear normal.  Left Ear: External ear normal.  Nose: Nose normal.  Mouth/Throat: Oropharynx is clear and moist.  Eyes: EOM are normal. Pupils are equal, round, and reactive to light.  Neck: Normal range of motion. Neck supple. No thyromegaly present.  Cardiovascular: Normal rate, regular rhythm, normal heart sounds and intact distal pulses.  Exam reveals no gallop and no friction rub.   No murmur heard. Pulmonary/Chest: Effort normal and breath sounds normal.  Abdominal: Soft. Bowel sounds are normal. She exhibits no distension and no mass. There  is no tenderness. There is no rebound.  Genitourinary:       Bilateral breast exam normal  Musculoskeletal: Normal range of motion.  Lymphadenopathy:    She has no cervical adenopathy.  Neurological: She is alert. She has normal reflexes. No cranial nerve deficit. She exhibits normal muscle tone. Coordination normal.  Skin: Skin is warm and dry.  Psychiatric: She has a normal mood and affect. Her behavior is normal. Judgment and thought content normal.   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

## 2012-07-29 NOTE — Patient Instructions (Signed)
Congratulations on your 28 pounds of weight loss continue to work on that  Continue your other medications  Limited you use of Advil to 400 mg twice daily with food Tramadol 50 mg,,,,,,,,,, 1/2-1 tablet 3 times daily as needed for pain  Return in one year sooner if any problems

## 2012-08-03 ENCOUNTER — Encounter: Payer: Self-pay | Admitting: Family Medicine

## 2012-08-09 ENCOUNTER — Ambulatory Visit (INDEPENDENT_AMBULATORY_CARE_PROVIDER_SITE_OTHER): Payer: BC Managed Care – PPO | Admitting: Family Medicine

## 2012-08-09 DIAGNOSIS — Z0000 Encounter for general adult medical examination without abnormal findings: Secondary | ICD-10-CM

## 2012-08-09 DIAGNOSIS — Z23 Encounter for immunization: Secondary | ICD-10-CM

## 2013-01-28 ENCOUNTER — Telehealth: Payer: Self-pay | Admitting: Family Medicine

## 2013-01-28 DIAGNOSIS — G4730 Sleep apnea, unspecified: Secondary | ICD-10-CM

## 2013-01-28 NOTE — Telephone Encounter (Signed)
Patient called stating that she was told by [facility] that she need to be referred to a Neurologist preferably Dr. Porfirio Mylar Dohmeier. Please assist.

## 2013-01-31 NOTE — Telephone Encounter (Signed)
Left message on machine for patient to return our call.  Reason for referral?

## 2013-02-01 NOTE — Telephone Encounter (Signed)
Spoke with patient and she will need to see a neurologist for a new cpap machine

## 2013-02-01 NOTE — Telephone Encounter (Signed)
Left message on machine for patient to return our call 

## 2013-03-03 ENCOUNTER — Telehealth: Payer: Self-pay | Admitting: *Deleted

## 2013-03-03 NOTE — Telephone Encounter (Signed)
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  Expressed how sorry we were for the mistake and for the time it cost her and that we were sorry we were not able to do more for her while she was here.  Offered to bring her in on the soonest available appointment and OOP would be $0.  Also explained we will be sure to provide a gift card to cover her gas expense when she returns for her appt.  Asked her to call to schedule or discuss further when she can.  -sh

## 2013-03-04 NOTE — Telephone Encounter (Signed)
Please be advised that the patient has been scheduled with Dr. Frances Furbish on 4/16 @ 8:30am.  She did want to make sure that, per your message, she had no out of pocket expense, so you may want to reiterate to the front desk. Thanks!

## 2013-03-16 ENCOUNTER — Ambulatory Visit (INDEPENDENT_AMBULATORY_CARE_PROVIDER_SITE_OTHER): Payer: BC Managed Care – PPO | Admitting: Neurology

## 2013-03-16 ENCOUNTER — Telehealth: Payer: Self-pay | Admitting: Family Medicine

## 2013-03-16 ENCOUNTER — Institutional Professional Consult (permissible substitution): Payer: Self-pay | Admitting: Neurology

## 2013-03-16 ENCOUNTER — Encounter: Payer: Self-pay | Admitting: Neurology

## 2013-03-16 VITALS — BP 210/91 | HR 62 | Temp 97.9°F | Ht 64.0 in | Wt 291.0 lb

## 2013-03-16 DIAGNOSIS — G4730 Sleep apnea, unspecified: Secondary | ICD-10-CM

## 2013-03-16 DIAGNOSIS — I10 Essential (primary) hypertension: Secondary | ICD-10-CM

## 2013-03-16 NOTE — Telephone Encounter (Signed)
Left message on machine for patient samples of benicar and bystolic are available

## 2013-03-16 NOTE — Progress Notes (Signed)
Subjective:    Patient ID: Tamara [name] is a 62 y.o. female.  HPI  Huston Foley, MD, PhD [facility] [address], [address] [address] [place], [place] [address]  Dear Dr. Tawanna Cooler,   I saw your patient, Tamara [name], upon your kind request in my neurologic clinic today for initial consultation of her sleep disorder. The patient is unaccompanied today. As you know, Tamara [name] is a very pleasant 62 year old right-handed lady with an underlying medical history of hypertension, hyperlipidemia, asthma, allergic rhinitis, morbid obesity and osteoarthritis who has been known to snore and for quite some time and was diagnosed with OSA some 3 or 4 years ago at [facility]. Her own CPAP machine broke about a month ago and she brings in a Industrial/product designer. I reviewed data from the machine: pressure is high at 20 cm and she uses a small Mirage Quattro FFM. She has been using CPAP regularly. She reports a approximately 40 lb wt loss since her original study. Her BT is 10 PM and has occasional trouble falling asleep. She takes Advil PM at times which helps. Her wake time is AM currently as she started a new job in [employer]. She is a Air traffic controller. She endorses a lot of stress and this is why she feels her BP is elevated today: 210/91. She denies HA, CP, SOB, blurry vision or any other new Sx with this elevated BP. She did not take any of her BP meds and she admits to not taking Losartan and Carvedilol for the past week, as she was out and cannot pay for the meds till pay day,which is the last day of the month! She reports no cataplexy, sleep paralysis, hypnagogic or hypnopompic hallucinations or sleep attacks. Her Epworth sleepiness score is 13/24 today. She reports no frank restless leg symptoms, but was told she had RLS after the sleep study. She has discomfort in her L leg from cellulitis and this spread to her abdomen. She is currently on an antibiotic.    Her Past Medical  History Is Significant For: Past Medical History  Diagnosis Date  . Depression   . Hypertension   . Obese   . Cellulitis and abscess of leg   . Arthritis   . Asthma   . Sleep apnea, obstructive   . Shortness of breath   . Wears glasses   . Hearing loss     Her Past Surgical History Is Significant For: Past Surgical History  Procedure Laterality Date  . Joint replacement      Knee  . Tubal ligation    . Inner ear surgery      Her Family History Is Significant For: Family History  Problem Relation Age of Onset  . Dementia Mother   . Asthma Other   . Hypertension Other   . Thyroid disease Other   . Heart attack Other     Her Social History Is Significant For: History   Social History  . Marital Status: Divorced    Spouse Name: N/A    Number of Children: N/A  . Years of Education: N/A   Social History Main Topics  . Smoking status: Never Smoker   . Smokeless tobacco: None  . Alcohol Use: No  . Drug Use:   . Sexually Active:    Other Topics Concern  . None   Social History Narrative  . None    Her Allergies Are:  No Known Allergies:   Her Current  Medications Are:  Outpatient Encounter Prescriptions as of 03/16/2013  Medication Sig Dispense Refill  . albuterol (PROVENTIL HFA;VENTOLIN HFA) 108 (90 BASE) MCG/ACT inhaler Inhale 2 puffs into the lungs every 6 (six) hours as needed.  18 g  2  . aspirin 81 MG tablet Take 81 mg by mouth daily.        . carvedilol (COREG) 25 MG tablet Take 25 mg by mouth 2 (two) times daily with a meal.      . ceFAZolin (ANCEF) 1 G injection Inject into the muscle.        Marland Kitchen DUAC gel       . fish oil-omega-3 fatty acids 1000 MG capsule Take 2 g by mouth daily.        . fluconazole (DIFLUCAN) 150 MG tablet       . furosemide (LASIX) 40 MG tablet Take 1 tablet (40 mg total) by mouth daily.  100 tablet  3  . Ibuprofen-Diphenhydramine HCl (ADVIL PM) 200-25 MG CAPS Take 2 capsules by mouth at bedtime.        Marland Kitchen ketoconazole (NIZORAL)  200 MG tablet Take 200 mg by mouth daily.        Marland Kitchen losartan (COZAAR) 100 MG tablet Take 1 tablet (100 mg total) by mouth daily.  100 tablet  3  . MICRO GUARD 2 % powder APPLY TWO TIMES A DAY.  170 g  10  . NON FORMULARY Nizoral/cutivate 1:1 daily      . PARoxetine (PAXIL) 40 MG tablet Take 40 mg by mouth daily.      . potassium chloride SA (KLOR-CON M20) 20 MEQ tablet Take 1 tablet (20 mEq total) by mouth daily.  100 tablet  3  . pseudoephedrine-guaifenesin (MUCINEX D) 60-600 MG per tablet Take 1 tablet by mouth every 12 (twelve) hours.        . simvastatin (ZOCOR) 20 MG tablet Take 1 tablet (20 mg total) by mouth at bedtime.  100 tablet  3  . traMADol (ULTRAM) 50 MG tablet       . VITAMIN D, CHOLECALCIFEROL, PO Take by mouth.        Marland Kitchen CIPRODEX otic suspension       . divalproex (DEPAKOTE ER) 250 MG 24 hr tablet Take 1 tablet (250 mg total) by mouth daily.  100 tablet  3  . furosemide (LASIX) 20 MG tablet Take 1 tablet (20 mg total) by mouth daily.  100 tablet  3  . HYDROcodone-acetaminophen (NORCO) 5-325 MG per tablet Take 1 tablet by mouth every 12 (twelve) hours.        . vitamin E 100 UNIT capsule Take 100 Units by mouth daily.         No facility-administered encounter medications on file as of 03/16/2013.  :  Review of Systems  HENT: Positive for hearing loss.   Cardiovascular: Positive for leg swelling.  Endocrine: Positive for heat intolerance and polydipsia.  Musculoskeletal: Positive for joint swelling and arthralgias.  Neurological:       Restless leg  Psychiatric/Behavioral: Positive for sleep disturbance (not enough sleep, sleepiness) and dysphoric mood.    Objective:  Neurologic Exam  Physical Exam Physical Examination:   Filed Vitals:   03/16/13 0839  BP: 210/91  Pulse: 62  Temp: 97.9 F (36.6 C)    General Examination: The patient is a very pleasant 62 y.o. female in no acute distress, but is tearful at times. She is morbidly obese.   HEENT: Normocephalic,  atraumatic, pupils  are equal, round and reactive to light and accommodation. Funduscopic exam is normal with sharp disc margins noted. Extraocular tracking is good without nystagmus noted. Normal smooth pursuit is noted. Hearing is grossly intact. Face is symmetric with normal facial animation and normal facial sensation. Speech is clear with no dysarthria noted. There is no hypophonia. There is no lip, neck or jaw tremor. Neck is supple with full range of motion. There are no carotid bruits on auscultation. Oropharynx exam reveals normal findings. Moderated airway crowding is noted d/t narrow airway entry and larger uvula, redundant soft palate. Mallampati is class III. Neck size is 17 1/2 inches. Tongue protrudes centrally and palate elevates symmetrically. Tonsils are 1+.   Chest: is clear to auscultation without wheezing, rhonchi or crackles noted.  Heart: sounds are regular and normal without murmurs, rubs or gallops noted.   Abdomen: is soft, non-tender and obese, protruberant with normal bowel sounds appreciated on auscultation.  Extremities: There is trace pitting edema in the distal lower extremities bilaterally.   Skin: is warm and dry with no trophic changes noted.  Musculoskeletal: exam reveals mild joint swelling in her knees and mild erythema in her distal L leg.   Neurologically:  Mental status: The patient is awake, alert and oriented in all 4 spheres. Her memory, attention, language and knowledge are appropriate. There is no aphasia, agnosia, apraxia or anomia. Speech is clear with normal prosody and enunciation. Thought process is linear. Mood is slightly tearful and affect is normal.  Cranial nerves are as described above under HEENT exam. In addition, shoulder shrug is normal with equal shoulder height noted. Motor exam: Normal bulk, strength and tone is noted. There is no drift, tremor or rebound. Romberg is negative. Reflexes are trace in the UEs and absent in the LEs. Toes are  downgoing bilaterally. Fine motor skills are intact with normal finger taps, normal hand movements, normal rapid alternating patting, normal foot taps and normal foot agility.  Cerebellar testing shows no dysmetria or intention tremor on finger to nose testing. Heel to shin is not possible d/t body habitus. unremarkable bilaterally. There is no truncal or gait ataxia.  Sensory exam is intact to light touch, pinprick, vibration, temperature sense and proprioception in the upper and lower extremities.  Gait, station and balance are unremarkable. No veering to one side is noted. No leaning to one side is noted. Posture is age-appropriate and stance is wide based. No problems turning are noted. She turns en bloc in 3 steps. Tandem walk is not possible, nor toe and heel stance.               Assessment and Plan:   Assessment and Plan:  In summary, Tamara [name] is a very pleasant 62 y.o.-year old female with a complex medical history including hypertension as well as a prior diagnosis of obstructive sleep apnea who has been using CPAP higher pressure of 20 cm. She has morbid obesity and her own CPAP machine broke and she is currently using a loner machine. Unfortunately she has not been able to take her medications for blood pressure for the past week and indicates that she is not able to fill the medications until the last month. We contacted your office because her blood pressure was high at 210/91 and upon completion of our appointment her repeat blood pressure manually was 180/112 on the left side. Again, she denies any symptoms of hypertensive urgency but we got in touch with your clinic staff and they  indicated that Rachael from your office would be calling the patient on her cell phone to help coordinate treatment and perhaps bridge her with some samples or a cheaper medication until she is able to fill her own prescription. She has not taken her Lasix this morning because she says she cannot take it and  go to work. She takes it in the afternoon. She is strongly advised to not quit taking her blood pressure medication and advised about her diagnosis of obstructive sleep apnea and the need for treatment. At this juncture, we need to bring her back for a split-night study because it has been 3 or 4 years since her last study and she has indicated that she lost about 40 pounds since the original study. It is very well possible that she does not need such high pressure at this moment and am hoping that we can bring the pressure down to perhaps 16 or so. She is advised that I will order a sleep study and then see her back afterwards. I'm hoping to be able to prescribe a new machine for her and she indicated agreement with the plan. If she has any acute symptoms such as sudden onset of headache, blurry vision, chest pain, shortness of breath or one-sided weakness or numbness she is strongly advised to call 911 or be taken to the emergency room. She understood. The reason why she is not having much in the way of symptoms with her blood pressure is because it probably has been creeping up since she stopped taking her BP meds a week ago or so. Since she was symptom free in that regard I let her go from clinic. She was advised to be expecting a phone call from her clinic today.  Thank you very much for allowing me to participate in the care of this nice patient. If I can be of any further assistance to you please do not hesitate to call me at [phone].  Sincerely,   Huston Foley, MD, PhD

## 2013-03-16 NOTE — Telephone Encounter (Signed)
Pt is at [facility] and BP is 210/91. She's been out of BP meds for 1wk. States she cannot fill until end of month, when she gets paid. We do not carry samples of losartan or Coreg. Please advise and call.

## 2013-03-16 NOTE — Patient Instructions (Addendum)
We will schedule you for a sleep study so we can determine the right pressure for you. i will prescribe a new machine for you and then see you back. Pls follow up with your PCP for your BP issue. They may be able to give you samples to get you through the end of the month.

## 2013-04-01 ENCOUNTER — Ambulatory Visit (INDEPENDENT_AMBULATORY_CARE_PROVIDER_SITE_OTHER): Payer: BC Managed Care – PPO

## 2013-04-01 DIAGNOSIS — G4730 Sleep apnea, unspecified: Secondary | ICD-10-CM

## 2013-04-01 DIAGNOSIS — I10 Essential (primary) hypertension: Secondary | ICD-10-CM

## 2013-04-01 DIAGNOSIS — R0609 Other forms of dyspnea: Secondary | ICD-10-CM

## 2013-04-01 DIAGNOSIS — G4733 Obstructive sleep apnea (adult) (pediatric): Secondary | ICD-10-CM

## 2013-04-01 DIAGNOSIS — R0989 Other specified symptoms and signs involving the circulatory and respiratory systems: Secondary | ICD-10-CM

## 2013-04-01 DIAGNOSIS — G4710 Hypersomnia, unspecified: Secondary | ICD-10-CM

## 2013-04-15 ENCOUNTER — Telehealth: Payer: Self-pay | Admitting: Neurology

## 2013-04-15 DIAGNOSIS — G4733 Obstructive sleep apnea (adult) (pediatric): Secondary | ICD-10-CM

## 2013-04-15 NOTE — Telephone Encounter (Signed)
Please arrange for CPAP set up at home and fax/route report to Dr. Tawanna Cooler. Patient needs FU appointment with me in 6-8 weeks post set up, thanks.

## 2013-04-15 NOTE — Telephone Encounter (Signed)
Patient already established with [facility].  Will send CPAP referral.

## 2013-04-15 NOTE — Telephone Encounter (Addendum)
Pt has been notified of sleep study results.  Copy of the sleep study report has been routed to Dr. Tawanna Cooler.  Pt will be mailed a copy.  CPAP will be set up by [facility].  Pt will be contacted by them with further instruction...kl

## 2013-07-27 ENCOUNTER — Other Ambulatory Visit: Payer: BC Managed Care – PPO

## 2013-07-29 ENCOUNTER — Telehealth: Payer: Self-pay | Admitting: Family Medicine

## 2013-07-29 ENCOUNTER — Ambulatory Visit: Payer: BC Managed Care – PPO | Admitting: Neurology

## 2013-07-29 NOTE — Telephone Encounter (Signed)
Jonnae/patient Phone [phone] called with questions about Divalproex.  No answer at time of call back;  Left message to call if assistance is still needed.

## 2013-08-02 ENCOUNTER — Encounter: Payer: BC Managed Care – PPO | Admitting: Family Medicine

## 2013-08-02 ENCOUNTER — Other Ambulatory Visit: Payer: Self-pay | Admitting: Family Medicine

## 2013-08-04 ENCOUNTER — Other Ambulatory Visit: Payer: Self-pay | Admitting: *Deleted

## 2013-08-04 MED ORDER — CARVEDILOL 25 MG PO TABS: 25.0000 mg | ORAL_TABLET | Freq: Two times a day (BID) | ORAL | Status: DC

## 2013-08-08 ENCOUNTER — Other Ambulatory Visit: Payer: Self-pay | Admitting: *Deleted

## 2013-08-08 MED ORDER — TRAMADOL HCL 50 MG PO TABS: 50.0000 mg | ORAL_TABLET | Freq: Three times a day (TID) | ORAL | Status: DC | PRN

## 2013-09-01 ENCOUNTER — Other Ambulatory Visit: Payer: Self-pay | Admitting: Family Medicine

## 2013-09-02 ENCOUNTER — Ambulatory Visit: Payer: BC Managed Care – PPO | Admitting: Neurology

## 2013-09-12 ENCOUNTER — Other Ambulatory Visit: Payer: Self-pay

## 2013-09-12 DIAGNOSIS — Z1231 Encounter for screening mammogram for malignant neoplasm of breast: Secondary | ICD-10-CM

## 2013-09-21 ENCOUNTER — Other Ambulatory Visit (INDEPENDENT_AMBULATORY_CARE_PROVIDER_SITE_OTHER): Payer: BC Managed Care – PPO

## 2013-09-21 DIAGNOSIS — Z0000 Encounter for general adult medical examination without abnormal findings: Secondary | ICD-10-CM

## 2013-09-21 LAB — BASIC METABOLIC PANEL
CO2: 29 mEq/L (ref 19–32)
Calcium: 9.4 mg/dL (ref 8.4–10.5)
Creatinine, Ser: 0.8 mg/dL (ref 0.4–1.2)
GFR: 78 mL/min (ref >60.00)
Sodium: 143 mEq/L (ref 135–145)

## 2013-09-21 LAB — HEPATIC FUNCTION PANEL
ALT: 22 U/L (ref 0–35)
AST: 22 U/L (ref 0–37)
Albumin: 3.7 g/dL (ref 3.5–5.2)
Alkaline Phosphatase: 54 U/L (ref 39–117)
Total Protein: 7.3 g/dL (ref 6.0–8.3)

## 2013-09-21 LAB — LIPID PANEL
HDL: 48.5 mg/dL (ref >39.00)
Triglycerides: 205 mg/dL — ABNORMAL HIGH (ref 0.0–149.0)
VLDL: 41 mg/dL — ABNORMAL HIGH (ref 0.0–40.0)

## 2013-09-21 LAB — CBC WITH DIFFERENTIAL/PLATELET
Basophils Absolute: 0 10*3/uL (ref 0.0–0.1)
Basophils Relative: 0.4 % (ref 0.0–3.0)
Eosinophils Absolute: 0.2 10*3/uL (ref 0.0–0.7)
Lymphocytes Relative: 31.6 % (ref 12.0–46.0)
MCHC: 33.8 g/dL (ref 30.0–36.0)
Monocytes Relative: 11.4 % (ref 3.0–12.0)
Neutrophils Relative %: 53.5 % (ref 43.0–77.0)
RBC: 4.81 Mil/uL (ref 3.87–5.11)

## 2013-09-21 LAB — POCT URINALYSIS DIPSTICK
Ketones, UA: NEGATIVE
Protein, UA: NEGATIVE
Spec Grav, UA: >=1.03
pH, UA: 6

## 2013-09-27 ENCOUNTER — Ambulatory Visit (INDEPENDENT_AMBULATORY_CARE_PROVIDER_SITE_OTHER): Payer: BC Managed Care – PPO | Admitting: Family Medicine

## 2013-09-27 ENCOUNTER — Encounter: Payer: Self-pay | Admitting: Family Medicine

## 2013-09-27 VITALS — BP 130/90 | Temp 98.3°F | Ht 66.0 in | Wt 296.0 lb

## 2013-09-27 DIAGNOSIS — E785 Hyperlipidemia, unspecified: Secondary | ICD-10-CM

## 2013-09-27 DIAGNOSIS — I10 Essential (primary) hypertension: Secondary | ICD-10-CM

## 2013-09-27 DIAGNOSIS — Z23 Encounter for immunization: Secondary | ICD-10-CM

## 2013-09-27 MED ORDER — SIMVASTATIN 20 MG PO TABS: ORAL_TABLET | ORAL | Status: DC

## 2013-09-27 MED ORDER — FUROSEMIDE 20 MG PO TABS: 20.0000 mg | ORAL_TABLET | Freq: Every day | ORAL | Status: DC

## 2013-09-27 MED ORDER — POTASSIUM CHLORIDE CRYS ER 20 MEQ PO TBCR: EXTENDED_RELEASE_TABLET | ORAL | Status: DC

## 2013-09-27 MED ORDER — FUROSEMIDE 40 MG PO TABS: 40.0000 mg | ORAL_TABLET | Freq: Every day | ORAL | Status: DC

## 2013-09-27 MED ORDER — PAROXETINE HCL 20 MG PO TABS: 20.0000 mg | ORAL_TABLET | ORAL | Status: DC

## 2013-09-27 MED ORDER — CARVEDILOL 25 MG PO TABS: 25.0000 mg | ORAL_TABLET | Freq: Two times a day (BID) | ORAL | Status: DC

## 2013-09-27 NOTE — Patient Instructions (Signed)
Let's begin to taper the Paxil,,,,,,,,,,, 20 mg,,,,,,,,,,,, one tablet daily for 3 weeks then one tablet Monday Wednesday Friday for 6 weeks then stop  We will set you up a consult with the general surgeons to relook at the weight loss surgery program again  ,

## 2013-09-27 NOTE — Progress Notes (Signed)
Subjective:    Patient ID: Tamara [name], female    DOB: 1951/07/09, 62 y.o.   MRN: [id number]  HPI Tamara [name] is a delightful 62 year old single female who comes in today for annual physical examination  She takes albuterol when necessary for asthma  She takes Corgard 25 mg twice a day, Lasix 40 mg in the morning and 20 at noon potassium 20 mEq daily, Cozaar 100 mg daily for hypertension BP 130/90  Her weight is 296 pounds he 66 inches tall. She did try diet and exercise program and lost 21 pounds this past spring.  She has a history of obstructive sleep apnea and is seeing a neurologist at [facility]. She also because of the obesity as skin flaps on her abdomen and has recurrent fungal infections and just will not heal.  She says she considered LAP-BAND surgery in the past but it insurance would not pay for it. I think would try to get another opinion and reconsult with our general surgeons.  She's also on Paxil 40 mg daily and would like to taper off of that.  She takes simvastatin 20 mg daily for hyperlipidemia along with an aspirin tablet  She takes tramadol 50 mg 3 times a day when necessary for severe pain. She's had surgery on her right knee x3 left knee times one.   Review of Systems  Constitutional: Negative.   HENT: Negative.   Eyes: Negative.   Respiratory: Negative.   Cardiovascular: Negative.   Gastrointestinal: Negative.   Endocrine: Negative.   Genitourinary: Negative.   Musculoskeletal: Negative.   Allergic/Immunologic: Negative.   Neurological: Negative.   Hematological: Negative.   Psychiatric/Behavioral: Negative.        Objective:   Physical Exam  Nursing note and vitals reviewed. Constitutional: She is oriented to person, place, and time. She appears well-developed and well-nourished.  HENT:  Head: Normocephalic and atraumatic.  Right Ear: External ear normal.  Left Ear: External ear normal.  Nose: Nose normal.  Mouth/Throat: Oropharynx is clear and  moist.  Bilateral hearing aids  Eyes: EOM are normal. Pupils are equal, round, and reactive to light.  Neck: Normal range of motion. Neck supple. No thyromegaly present.  Cardiovascular: Normal rate, regular rhythm, normal heart sounds and intact distal pulses.  Exam reveals no gallop and no friction rub.   No murmur heard. No carotid aortic bruits peripheral pulses 2+ and symmetrical  Pulmonary/Chest: Effort normal and breath sounds normal.  Abdominal: Soft. Bowel sounds are normal. She exhibits no distension and no mass. There is no tenderness. There is no rebound.  Massive panniculus with erythema under the skin folds  Genitourinary:  Bilateral breast exam normal  She has never had abnormal Paps. She went through menopause at age 62 with no complications not sexually active therefore pelvic and Paps no longer indicated  Musculoskeletal: Normal range of motion.  Lymphadenopathy:    She has no cervical adenopathy.  Neurological: She is alert and oriented to person, place, and time. She has normal reflexes. No cranial nerve deficit. She exhibits normal muscle tone. Coordination normal.  Skin: Skin is warm and dry.  Psychiatric: She has a normal mood and affect. Her behavior is normal. Judgment and thought content normal.          Assessment & Plan:  Morbid obesity,,,,,,, reconsult to see if the LAP-BAND surgery would be indicated in something she could afford  Hypertension continue current medications  Hyperlipidemia continue current medications  Chronic fungal infection secondary to panniculus followup  by Dr. Margo Aye  History of depression taper off Paxil  Hyperlipidemia continue simvastatin and aspirin  Sleep apnea followed up by neurology

## 2013-10-03 ENCOUNTER — Ambulatory Visit (INDEPENDENT_AMBULATORY_CARE_PROVIDER_SITE_OTHER): Payer: BC Managed Care – PPO | Admitting: Neurology

## 2013-10-03 ENCOUNTER — Encounter: Payer: Self-pay | Admitting: Neurology

## 2013-10-03 ENCOUNTER — Ambulatory Visit
Admission: RE | Admit: 2013-10-03 | Discharge: 2013-10-03 | Disposition: A | Discharge status: Home / Self Care | Payer: BC Managed Care – PPO | Source: Ambulatory Visit

## 2013-10-03 VITALS — BP 195/84 | HR 61 | Temp 98.1°F | Ht 64.0 in

## 2013-10-03 DIAGNOSIS — G4733 Obstructive sleep apnea (adult) (pediatric): Secondary | ICD-10-CM

## 2013-10-03 DIAGNOSIS — I10 Essential (primary) hypertension: Secondary | ICD-10-CM

## 2013-10-03 DIAGNOSIS — Z1231 Encounter for screening mammogram for malignant neoplasm of breast: Secondary | ICD-10-CM

## 2013-10-03 DIAGNOSIS — R609 Edema, unspecified: Secondary | ICD-10-CM

## 2013-10-03 DIAGNOSIS — R600 Localized edema: Secondary | ICD-10-CM

## 2013-10-03 NOTE — Patient Instructions (Addendum)
Please continue using your CPAP regularly. While your insurance requires that you use CPAP at least 4 hours each night on 70% of the nights, I recommend, that you not skip any nights and use it throughout the night if you can. Getting used to CPAP does take time and patience and discipline. Untreated obstructive sleep apnea when it is moderate to severe can have an adverse impact on cardiovascular health and raise her risk for heart disease, arrhythmias, hypertension, congestive heart failure, stroke and diabetes. Untreated obstructive sleep apnea causes sleep disruption, nonrestorative sleep, and sleep deprivation. This can have an impact on your day to day functioning and cause daytime sleepiness and impairment of cognitive function, memory loss, mood disturbance, and problems focussing. Using CPAP regularly can improve these symptoms.  You can try taking melatonin, around 5 mg, one hour before bedtime for sleep. You may take up to 10 mg if needed.

## 2013-10-03 NOTE — Progress Notes (Signed)
Subjective:    Patient ID: Tamara [name] is a 62 y.o. female.  HPI  Interim history:  Tamara [name] is a very pleasant 62 year old right-handed lady with an underlying medical history of hypertension, hyperlipidemia, asthma, allergic rhinitis, morbid obesity and osteoarthritis who presents for followup consultation of her short of sleep apnea after her sleep study. I first met her on 03/16/2013, at which time she reported a prior diagnosis of OSA and a recent problem with her CPAP machine which broke. She was on a high pressure of 20 cm using a small full face mask. At the time, her BP was very high and she indicated, that she had been without her hypertension medication for about a week. She was quite tearful and indicated that she had trouble affording her medications. She has no acute symptoms associated with the blood pressure which was 210/91 initially and upon manual recheck 180/112. We got in touch with her primary care physician's office and they indicated that they would call the patient.  I asked her to come back for a sleep study and she had a split-night sleep study on 04/01/2013. I went over her test results with her in detail today. Sleep efficiency at baseline was reduced at 68.6% with a latency to sleep of 53 minutes and wake after sleep onset of 4.5 minutes with severe sleep fragmentation noted. She had a increased percentage of stage I sleep, absence of slow-wave and REM sleep. She had loud snoring. Her baseline AHI was 66.5 per hour. Her oxygen saturation at baseline was 93%, her nadir was 84%. She was then titrated on CPAP, using the nasal mask. He was titrated from 6-9 cm of water pressure with reduction of her AHI to 0 events per hour at that pressure. I reviewed compliance data from her machine, as we could not get it to download from the SD card. She has an older machine, but is eligible for a new machine in 2/15. She has been using the machine every day for the past 30 days. Percent  use stays greater than 4 hours was 100%, her average usage of 7 hours and 54 minutes, residual AHI is 0.9 per hour, pressure is set at 20 cm. She also indicates full compliance and the only issue she continually has severe mouth dryness. She has not changed her humidifier setting since the original set up. She monitors her blood pressure at home. It is usually better than what it is here. She had a full physical last week and her blood pressure was good at the time. She does indicate that she has not been taking her Lasix as prescribed because she cannot take frequent bathroom breaks at work. 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 He does have significant lower extremity swelling at times and she has a history of cellulitis of her left leg. She has right leg pain in her knees and had multiple surgeries to her right knee.  Her Past Medical History Is Significant For: Past Medical History  Diagnosis Date  . Depression   . Hypertension   . Obese   . Cellulitis and abscess of leg   . Arthritis   . Asthma   . Sleep apnea, obstructive   . Shortness of breath   . Wears glasses   . Hearing loss     Her Past  Surgical History Is Significant For: Past Surgical History  Procedure Laterality Date  . Joint replacement      Knee  . Tubal ligation    . Inner ear surgery      Her Family History Is Significant For: Family History  Problem Relation Age of Onset  . Dementia Mother   . Asthma Other   . Hypertension Other   . Thyroid disease Other   . Heart attack Other     Her Social History Is Significant For: History   Social History  . Marital Status: Divorced    Spouse Name: N/A    Number of Children: N/A  . Years of Education: N/A   Social History Main Topics  . Smoking status: Never Smoker   . Smokeless tobacco: None  . Alcohol Use: No   . Drug Use:   . Sexual Activity:    Other Topics Concern  . None   Social History Narrative  . None    Her Allergies Are:  No Known Allergies:   Her Current Medications Are:  Outpatient Encounter Prescriptions as of 10/03/2013  Medication Sig  . albuterol (PROVENTIL HFA;VENTOLIN HFA) 108 (90 BASE) MCG/ACT inhaler Inhale 2 puffs into the lungs every 6 (six) hours as needed.  Marland Kitchen aspirin 81 MG tablet Take 81 mg by mouth daily.    . carvedilol (COREG) 25 MG tablet Take 1 tablet (25 mg total) by mouth 2 (two) times daily with a meal.  . divalproex (DEPAKOTE ER) 250 MG 24 hr tablet Take 1 tablet (250 mg total) by mouth daily.  . DUAC gel   . fish oil-omega-3 fatty acids 1000 MG capsule Take 2 g by mouth daily.    . fluconazole (DIFLUCAN) 150 MG tablet   . furosemide (LASIX) 20 MG tablet Take 1 tablet (20 mg total) by mouth daily.  . furosemide (LASIX) 40 MG tablet Take 1 tablet (40 mg total) by mouth daily.  Marland Kitchen HYDROcodone-acetaminophen (NORCO) 5-325 MG per tablet Take 1 tablet by mouth every 12 (twelve) hours.    . Ibuprofen-Diphenhydramine Cit (ADVIL PM PO) Take 2 tablets by mouth at bedtime as needed.  . Ibuprofen-Diphenhydramine HCl (ADVIL PM) 200-25 MG CAPS Take 2 capsules by mouth at bedtime.    Marland Kitchen ketoconazole (NIZORAL) 200 MG tablet Take 200 mg by mouth daily.    Marland Kitchen losartan (COZAAR) 100 MG tablet Take 1 tablet (100 mg total) by mouth daily.  Marland Kitchen MICRO GUARD 2 % powder APPLY TWO TIMES A DAY.  . NON FORMULARY Nizoral/cutivate 1:1 daily  . PARoxetine (PAXIL) 20 MG tablet Take 1 tablet (20 mg total) by mouth every morning.  . potassium chloride SA (KLOR-CON M20) 20 MEQ tablet TAKE ONE TABLET BY MOUTH EVERY DAY  . pseudoephedrine-guaifenesin (MUCINEX D) 60-600 MG per tablet Take 1 tablet by mouth every 12 (twelve) hours.    . simvastatin (ZOCOR) 20 MG tablet TAKE ONE TABLET BY MOUTH AT BEDTIME  . traMADol (ULTRAM) 50 MG tablet Take 1 tablet (50 mg total) by mouth every 8 (eight) hours  as needed for pain.  Marland Kitchen VITAMIN D, CHOLECALCIFEROL, PO Take by mouth.    . vitamin E 100 UNIT capsule Take 100 Units by mouth daily.    :  Review of Systems:  Out of a complete 14 point review of systems, all are reviewed and negative with the exception of these symptoms as listed below: Review of Systems  Constitutional: Positive for activity change (disinterest), fatigue and unexpected weight change.  HENT: Positive for hearing loss and tinnitus.   Eyes: Negative.   Respiratory: Positive for shortness of breath.        Snoring  Cardiovascular: Positive for leg swelling.  Gastrointestinal: Negative.   Endocrine: Positive for polydipsia.  Genitourinary: Negative.   Musculoskeletal: Positive for arthralgias, joint swelling and myalgias.  Skin: Positive for rash.  Allergic/Immunologic: Positive for environmental allergies.  Neurological:       Restless leg  Hematological: Negative.   Psychiatric/Behavioral: Positive for sleep disturbance and dysphoric mood.    Objective:  Neurologic Exam  Physical Exam Physical Examination:   Filed Vitals:   10/03/13 0823  BP: 195/84  Pulse: 61  Temp: 98.1 F (36.7 C)    General Examination: The patient is a very pleasant female in no acute distress. She is morbidly obese. She is in better spirits today.  HEENT: Normocephalic, atraumatic, pupils are equal, round and reactive to light and accommodation. Extraocular tracking is good without nystagmus noted. Normal smooth pursuit is noted. Hearing is grossly intact. Face is symmetric with normal facial animation and normal facial sensation. Speech is clear with no dysarthria noted. There is no hypophonia. There is no lip, neck or jaw tremor. Neck is supple with full range of motion. There are no carotid bruits on auscultation. Oropharynx exam reveals normal findings. Moderated airway crowding is noted d/t narrow airway entry and larger uvula, redundant soft palate. Mallampati is class III. Neck  size is large. Tongue protrudes centrally and palate elevates symmetrically. Tonsils are 1+.   Chest: is clear to auscultation without wheezing, rhonchi or crackles noted.  Heart: sounds are regular and normal without murmurs, rubs or gallops noted.   Abdomen: is soft, non-tender and obese, protruberant with normal bowel sounds appreciated on auscultation.  Extremities: There is 1-2+ pitting edema in the distal lower extremities bilaterally, L more than R. She had cellulitis in the L leg years ago.   Skin: is warm and dry with no trophic changes noted.  Musculoskeletal: exam reveals mild joint swelling in her knees and mild erythema in her distal L leg.   Neurologically:  Mental status: The patient is awake, alert and oriented in all 4 spheres. Her memory, attention, language and knowledge are appropriate. There is no aphasia, agnosia, apraxia or anomia. Speech is clear with normal prosody and enunciation. Thought process is linear. Mood is slightly tearful and affect is normal.  Cranial nerves are as described above under HEENT exam. In addition, shoulder shrug is normal with equal shoulder height noted. Motor exam: Normal bulk, strength and tone is noted. There is no drift, tremor or rebound. Romberg is negative. Reflexes are trace in the UEs and absent in the LEs. Toes are downgoing bilaterally. Fine motor skills are intact with normal finger taps, normal hand movements, normal rapid alternating patting, normal foot taps and normal foot agility.  Cerebellar testing shows no dysmetria or intention tremor on finger to nose testing. There is no truncal or gait ataxia.  Sensory exam is intact to light touch, pinprick, vibration, temperature sense and proprioception in the upper and lower extremities.  Gait, station and balance: slow and slightly insecure gait, mostly d/t knee pain and body habitus. No veering to one side is noted. No leaning to one side is noted. Posture is age-appropriate and  stance is wide based. No problems turning are noted. She turns en bloc. Tandem walk is possible with difficulty, she cannot do toe and heel stance.  Assessment and Plan:   In summary, ORLEAN HOLTROP is a very pleasant 62 y.o.-year old female with a history of OSA, on CPAP treatment at a pressure of 20 cwp, even though I had changed the pressure to 9 cm, based on her latest sleep study. She indicates, that her original machine was repaired, but I had prescribed a new machine. She was explained the test results, and I went over the compliance data off of her machine with her as well. She has an elevated BP again today, but she also indicates that she had not been taking her Lasix as prescribed and she also tells me that she stopped the VPA. She works at [employer] and cannot take that many bathroom breaks, but changed departments some 4 weeks ago with a new supervisor and may be able to take her Lasix at work. Her physical exam is stable and She indicates good results with the use of CPAP, and good tolerance of the pressure and mask, but the only problems is severe mouth dryness, which I believe, is in large part d/t the high pressure. I do think we need to try her on the lower pressure, as indicated by the sleep study. We will inquire what happened from her DME company, [employer]. I did explain to her that she may initially feel some air hunger, as she is used to such a high pressure, in which case we will consider an increase in pressure gently to accommodate for tolerance. I encouraged her to continue to use CPAP regularly to help reduce cardiovascular risk. She is advised to continue monitoring her blood pressure and keep her legs elevated when possible. She's also advised to take all her medications as prescribed, including her water pills. We also talked about trying to maintaining a healthy lifestyle in general. I encouraged the patient to eat healthy, exercise daily and keep well  hydrated, to keep a scheduled bedtime and wake time routine, to not skip any meals and eat healthy snacks in between meals and to have protein with every meal. I stressed the importance of regular exercise.   I answered all her questions today and the patient was in agreement with the above outlined plan. I would like to see the patient back in 6 months, sooner if the need arises and encouraged her to call with any interim questions, concerns, problems or updates.  Most of my 30 minute visit today was spent in counseling and coordination of care, reviewing test results and reviewing medications.

## 2013-10-05 ENCOUNTER — Other Ambulatory Visit: Payer: Self-pay | Admitting: Family Medicine

## 2013-10-05 DIAGNOSIS — R928 Other abnormal and inconclusive findings on diagnostic imaging of breast: Secondary | ICD-10-CM

## 2013-10-25 ENCOUNTER — Ambulatory Visit
Admission: RE | Admit: 2013-10-25 | Discharge: 2013-10-25 | Disposition: A | Discharge status: Home / Self Care | Payer: BC Managed Care – PPO | Source: Ambulatory Visit | Attending: Family Medicine | Admitting: Family Medicine

## 2013-10-25 ENCOUNTER — Other Ambulatory Visit: Payer: Self-pay | Admitting: Family Medicine

## 2013-10-25 DIAGNOSIS — R928 Other abnormal and inconclusive findings on diagnostic imaging of breast: Secondary | ICD-10-CM

## 2013-12-01 DIAGNOSIS — Z923 Personal history of irradiation: Secondary | ICD-10-CM

## 2013-12-01 HISTORY — PX: BREAST LUMPECTOMY: SHX2

## 2013-12-01 HISTORY — DX: Personal history of irradiation: Z92.3

## 2013-12-02 ENCOUNTER — Ambulatory Visit (INDEPENDENT_AMBULATORY_CARE_PROVIDER_SITE_OTHER): Payer: BC Managed Care – PPO | Admitting: General Surgery

## 2013-12-02 ENCOUNTER — Encounter (INDEPENDENT_AMBULATORY_CARE_PROVIDER_SITE_OTHER): Payer: Self-pay

## 2013-12-02 ENCOUNTER — Encounter (INDEPENDENT_AMBULATORY_CARE_PROVIDER_SITE_OTHER): Payer: Self-pay | Admitting: General Surgery

## 2013-12-02 VITALS — BP 162/101 | HR 90 | Temp 98.2°F | Resp 20 | Ht 65.0 in | Wt 302.4 lb

## 2013-12-02 DIAGNOSIS — R920 Mammographic microcalcification found on diagnostic imaging of breast: Secondary | ICD-10-CM

## 2013-12-02 NOTE — Progress Notes (Signed)
Subjective:   abnormal mammogram left breast  Patient ID: Tamara [name], female   DOB: 15-Dec-1950, 63 y.o.   MRN: [id number]  HPI Patient is a 63 year old female referred by Dr. Curlene Dolphin due to a recent abnormal screening and diagnostic mammogram. The patient recently presented for screening mammogram and there were felt to be some new calcifications in the left breast. Diagnostic mammogram was performed which I have personally reviewed. This shows a cluster area of pleomorphic calcifications measuring 2.6 x 1.5 x 1.3 cm in the retroareolar area of the left breast. No associated mass. Due to the patient's weight this was unable to be biopsied stereotactically and open excisional biopsy was recommended. She has not had any breast symptoms, specifically lump or pain or nipple discharge or skin changes. She has no personal history of breast disease or family history of breast cancer.  Past Medical History  Diagnosis Date  . Depression   . Hypertension   . Obese   . Cellulitis and abscess of leg   . Arthritis   . Asthma   . Sleep apnea, obstructive   . Shortness of breath   . Wears glasses   . Hearing loss    Past Surgical History  Procedure Laterality Date  . Joint replacement      Knee  . Tubal ligation    . Inner ear surgery     Current Outpatient Prescriptions  Medication Sig Dispense Refill  . albuterol (PROVENTIL HFA;VENTOLIN HFA) 108 (90 BASE) MCG/ACT inhaler Inhale 2 puffs into the lungs every 6 (six) hours as needed.  18 g  2  . aspirin 81 MG tablet Take 81 mg by mouth daily.        . carvedilol (COREG) 25 MG tablet Take 1 tablet (25 mg total) by mouth 2 (two) times daily with a meal.  180 tablet  3  . divalproex (DEPAKOTE ER) 250 MG 24 hr tablet TAKE ONE TABLET BY MOUTH EVERY DAY  100 tablet  3  . DUAC gel       . fish oil-omega-3 fatty acids 1000 MG capsule Take 2 g by mouth daily.        . fluconazole (DIFLUCAN) 150 MG tablet       . fluticasone (CUTIVATE) 0.05 % cream        . furosemide (LASIX) 20 MG tablet Take 1 tablet (20 mg total) by mouth daily.  100 tablet  3  . furosemide (LASIX) 40 MG tablet Take 1 tablet (40 mg total) by mouth daily.  100 tablet  3  . HYDROcodone-acetaminophen (NORCO) 5-325 MG per tablet Take 1 tablet by mouth every 12 (twelve) hours.        . Ibuprofen-Diphenhydramine Cit (ADVIL PM PO) Take 2 tablets by mouth at bedtime as needed.      . Ibuprofen-Diphenhydramine HCl (ADVIL PM) 200-25 MG CAPS Take 2 capsules by mouth at bedtime.        Marland Kitchen ketoconazole (NIZORAL) 2 % cream       . ketoconazole (NIZORAL) 200 MG tablet Take 200 mg by mouth daily.        Marland Kitchen MICRO GUARD 2 % powder APPLY TWO TIMES A DAY.  170 g  10  . NON FORMULARY Nizoral/cutivate 1:1 daily      . PARoxetine (PAXIL) 20 MG tablet Take 1 tablet (20 mg total) by mouth every morning.  100 tablet  3  . potassium chloride SA (KLOR-CON M20) 20 MEQ tablet TAKE ONE TABLET BY  MOUTH EVERY DAY  100 tablet  3  . pseudoephedrine-guaifenesin (MUCINEX D) 60-600 MG per tablet Take 1 tablet by mouth every 12 (twelve) hours.        . simvastatin (ZOCOR) 20 MG tablet TAKE ONE TABLET BY MOUTH AT BEDTIME  100 tablet  3  . traMADol (ULTRAM) 50 MG tablet Take 1 tablet (50 mg total) by mouth every 8 (eight) hours as needed for pain.  90 tablet  1  . VITAMIN D, CHOLECALCIFEROL, PO Take by mouth.        . vitamin E 100 UNIT capsule Take 100 Units by mouth daily.        Marland Kitchen losartan (COZAAR) 100 MG tablet Take 1 tablet (100 mg total) by mouth daily.  100 tablet  3   No current facility-administered medications for this visit.   No Known Allergies History  Substance Use Topics  . Smoking status: Never Smoker   . Smokeless tobacco: Not on file  . Alcohol Use: No     Review of Systems  Respiratory: Negative for cough and shortness of breath.        Sleep apnea, uses CPAP  Cardiovascular: Negative.   Gastrointestinal: Negative.   Musculoskeletal: Positive for arthralgias and myalgias.        Objective:   Physical Exam BP 162/101  Pulse 90  Temp(Src) 98.2 F (36.8 C) (Temporal)  Resp 20  Ht 5\' 5"  (1.651 m)  Wt 302 lb 6.4 oz (137.168 kg)  BMI 50.32 kg/m2 Gen., morbidly obese female in no acute distress Skin: No rash or infection HEENT: No palpable masses or thyromegaly. Sclera nonicteric Lungs: Clear equal breath sounds bilaterally without increased work of breathing Lymph nodes: No cervical, subclavicular or axillary nodes palpable Breasts: No palpable masses in either breast. No skin or nipple changes. Cardiovascular: Regular rate and rhythm. No discernible edema Abdomen: Obese soft and nontender. No masses Neurologic: Alert and oriented. Gait normal.    Assessment:     Recent mammogram showing new area of suspicious calcifications, BI-RADS 4, retroareolar on the left breast. Unable to undergo stereotactic biopsy. I have recommended proceeding with needle localized excisional biopsy. We discussed the indication for the procedure and possible diagnoses. We discussed use of general anesthesia and risks of heart or lung problems under anesthesia, bleeding, infection, and likely need for further surgery should we obtain a diagnosis of breast cancer. She was given literature regarding the procedure. All her questions were answered. She has recently seen Dr. Gaetano Net and a D&C has been recommended. We could possibly do this under the same anesthesia although her breast surgery will need to be done [facility] hospital for the imaging.     Plan:     Needle localized left breast excisional biopsy for abnormal microcalcifications under general anesthesia as an outpatient. Possibly coordinate with GYN for a D&C under the same anesthetic.

## 2013-12-19 ENCOUNTER — Encounter (INDEPENDENT_AMBULATORY_CARE_PROVIDER_SITE_OTHER): Payer: Self-pay

## 2013-12-19 ENCOUNTER — Telehealth (INDEPENDENT_AMBULATORY_CARE_PROVIDER_SITE_OTHER): Payer: Self-pay

## 2013-12-19 NOTE — Telephone Encounter (Signed)
Called patient and left message to return my call to our office.

## 2013-12-19 NOTE — Progress Notes (Signed)
RTW note mailed to patient home address per patient request

## 2013-12-19 NOTE — Telephone Encounter (Signed)
Message copied by Ivor Costa on Mon Dec 19, 2013 10:35 AM ------      Message from: Salvatore Marvel      Created: Fri Dec 16, 2013  9:25 AM      Regarding: Dr. Alric Quan phone call      Contact: [phone]       Patient is going to have breast surgery on 01/24/14 with Dr. Excell Seltzer and she received a letter to call the office and ask for [facility], please call her.            Thank you. ------

## 2013-12-26 ENCOUNTER — Encounter (HOSPITAL_COMMUNITY): Payer: Self-pay | Admitting: Pharmacist

## 2013-12-28 ENCOUNTER — Encounter (INDEPENDENT_AMBULATORY_CARE_PROVIDER_SITE_OTHER): Payer: Self-pay

## 2013-12-28 ENCOUNTER — Telehealth (INDEPENDENT_AMBULATORY_CARE_PROVIDER_SITE_OTHER): Payer: Self-pay

## 2013-12-28 NOTE — Telephone Encounter (Signed)
New RTW Letter mailed to patient address as noted in EPIC per patient request with new RTW date of 02/13/14.

## 2013-12-29 ENCOUNTER — Other Ambulatory Visit: Payer: Self-pay | Admitting: Family Medicine

## 2014-01-01 HISTORY — PX: HYSTEROPLASTY: SHX988

## 2014-01-03 ENCOUNTER — Encounter (INDEPENDENT_AMBULATORY_CARE_PROVIDER_SITE_OTHER): Payer: Self-pay

## 2014-01-03 ENCOUNTER — Encounter (HOSPITAL_COMMUNITY): Payer: Self-pay

## 2014-01-03 ENCOUNTER — Encounter (HOSPITAL_COMMUNITY)
Admission: RE | Admit: 2014-01-03 | Discharge: 2014-01-03 | Disposition: A | Discharge status: Home / Self Care | Payer: BC Managed Care – PPO | Source: Ambulatory Visit | Attending: Obstetrics and Gynecology | Admitting: Obstetrics and Gynecology

## 2014-01-03 LAB — CBC
HCT: 42.6 % (ref 36.0–46.0)
Hemoglobin: 14.4 g/dL (ref 12.0–15.0)
MCH: 28 pg (ref 26.0–34.0)
MCHC: 33.8 g/dL (ref 30.0–36.0)
MCV: 82.9 fL (ref 78.0–100.0)
PLATELETS: 188 10*3/uL (ref 150–400)
RBC: 5.14 MIL/uL — ABNORMAL HIGH (ref 3.87–5.11)
RDW: 13.3 % (ref 11.5–15.5)
WBC: 6.6 10*3/uL (ref 4.0–10.5)

## 2014-01-03 LAB — BASIC METABOLIC PANEL
BUN: 19 mg/dL (ref 6–23)
CO2: 28 meq/L (ref 19–32)
CREATININE: 0.8 mg/dL (ref 0.50–1.10)
Calcium: 9.3 mg/dL (ref 8.4–10.5)
Chloride: 101 mEq/L (ref 96–112)
GFR calc Af Amer: 89 mL/min — ABNORMAL LOW (ref >90)
GFR, EST NON AFRICAN AMERICAN: 77 mL/min — AB (ref >90)
Glucose, Bld: 91 mg/dL (ref 70–99)
Potassium: 3.9 mEq/L (ref 3.7–5.3)
Sodium: 140 mEq/L (ref 137–147)

## 2014-01-03 NOTE — Patient Instructions (Signed)
20 Tamara [name]  01/03/2014   Your procedure is scheduled on:  01/06/14  Enter through the Main Entrance of [facility] at [place] up the phone at the desk and dial [phone].   Call this number if you have problems the morning of surgery: [phone]   Remember:   Do not eat food:After Midnight.  Do not drink clear liquids: After Midnight.  Take these medicines the morning of surgery with A SIP OF WATER: Bring Inhaler, take Coreg, make take Paxil   Do not wear jewelry, make-up or nail polish.  Do not wear lotions, powders, or perfumes. You may wear deodorant.  Do not shave 48 hours prior to surgery.  Do not bring valuables to the hospital.  [facility] is not   responsible for any belongings or valuables brought to the hospital.  Contacts, dentures or bridgework may not be worn into surgery.  Leave suitcase in the car. After surgery it may be brought to your room.  For patients admitted to the hospital, checkout time is 11:00 AM the day of              discharge.   Patients discharged the day of surgery will not be allowed to drive             home.  Name and phone number of your driver: son  [name]    Special Instructions:   Shower using CHG 2 nights before surgery and the night before surgery.  If you shower the day of surgery use CHG.  Use special wash - you have one bottle of CHG for all showers.  You should use approximately 1/3 of the bottle for each shower.   Please read over the following fact sheets that you were given:   Surgical Site Infection Prevention

## 2014-01-05 MED ORDER — DEXTROSE 5 % IV SOLN
3.0000 g | INTRAVENOUS | Status: AC
  Administered 2014-01-06: 3 g via INTRAVENOUS
  Filled 2014-01-05: qty 3000

## 2014-01-05 NOTE — H&P (Signed)
Tamara [name] is an 63 y.o. female with post menopausal bleeding. U/S in office C/W 8 mm polypoid mass in endometrial cavity.   Pertinent Gynecological History: Menses: post-menopausal Bleeding: post menopausal bleeding Contraception: none DES exposure: denies Blood transfusions: none Sexually transmitted diseases: no past history Previous GYN Procedures: none  Last mammogram: normal Date: 2014 Last pap: normal Date: 2014 OB History: G3, P3   Menstrual History: Menarche age: unknown  No LMP recorded. Patient is postmenopausal.    Past Medical History  Diagnosis Date  . Depression   . Hypertension   . Obese   . Cellulitis and abscess of leg   . Arthritis   . Asthma   . Sleep apnea, obstructive   . Shortness of breath   . Wears glasses   . Hearing loss     Past Surgical History  Procedure Laterality Date  . Joint replacement      Knee  . Tubal ligation    . Inner ear surgery      Family History  Problem Relation Age of Onset  . Dementia Mother   . Asthma Other   . Hypertension Other   . Thyroid disease Other   . Heart attack Other     Social History:  reports that she has never smoked. She does not have any smokeless tobacco history on file. She reports that she does not drink alcohol or use illicit drugs.  Allergies: No Known Allergies  No prescriptions prior to admission    Review of Systems  Constitutional: Negative for fever.  Gastrointestinal: Negative for abdominal pain.    There were no vitals taken for this visit. Physical Exam  Cardiovascular: Normal rate and regular rhythm.   Respiratory: Effort normal and breath sounds normal.  GI: Soft. There is no tenderness.  Genitourinary:  Pelvic exam compromised by habitus    No results found for this or any previous visit (from the past 24 hour(s)).  No results found.  Assessment/Plan: 63 yo with PMB and polypoid mass in endometrial cavity.  H/S, D&C with truclear resectoscope D/W patient.   Risks reviewed including infection, uterine perforation and organ damage, DVT/PE, pneumonia, bleeding/transfusion-HIV/Hep. Recurrent polyps or PMB reviewed. All questions answered.  Tamara [name] II,Tamara [name] E 01/05/2014, 6:22 PM

## 2014-01-06 ENCOUNTER — Encounter (HOSPITAL_COMMUNITY): Payer: BC Managed Care – PPO | Admitting: Anesthesiology

## 2014-01-06 ENCOUNTER — Ambulatory Visit (HOSPITAL_COMMUNITY): Payer: BC Managed Care – PPO | Admitting: Anesthesiology

## 2014-01-06 ENCOUNTER — Ambulatory Visit (HOSPITAL_COMMUNITY)
Admission: RE | Admit: 2014-01-06 | Discharge: 2014-01-06 | Disposition: A | Discharge status: Home / Self Care | Payer: BC Managed Care – PPO | Source: Ambulatory Visit | Attending: Obstetrics and Gynecology | Admitting: Obstetrics and Gynecology

## 2014-01-06 ENCOUNTER — Encounter (HOSPITAL_COMMUNITY): Payer: Self-pay | Admitting: *Deleted

## 2014-01-06 ENCOUNTER — Encounter (HOSPITAL_COMMUNITY)
Admission: RE | Disposition: A | Discharge status: Home / Self Care | Payer: Self-pay | Source: Ambulatory Visit | Attending: Obstetrics and Gynecology

## 2014-01-06 DIAGNOSIS — N950 Postmenopausal bleeding: Secondary | ICD-10-CM | POA: Insufficient documentation

## 2014-01-06 DIAGNOSIS — N840 Polyp of corpus uteri: Secondary | ICD-10-CM | POA: Insufficient documentation

## 2014-01-06 DIAGNOSIS — J45909 Unspecified asthma, uncomplicated: Secondary | ICD-10-CM | POA: Insufficient documentation

## 2014-01-06 DIAGNOSIS — E669 Obesity, unspecified: Secondary | ICD-10-CM | POA: Insufficient documentation

## 2014-01-06 DIAGNOSIS — H9190 Unspecified hearing loss, unspecified ear: Secondary | ICD-10-CM | POA: Insufficient documentation

## 2014-01-06 DIAGNOSIS — I10 Essential (primary) hypertension: Secondary | ICD-10-CM | POA: Insufficient documentation

## 2014-01-06 DIAGNOSIS — G4733 Obstructive sleep apnea (adult) (pediatric): Secondary | ICD-10-CM | POA: Insufficient documentation

## 2014-01-06 HISTORY — PX: DILATATION & CURETTAGE/HYSTEROSCOPY WITH TRUECLEAR: SHX6353

## 2014-01-06 SURGERY — DILATATION & CURETTAGE/HYSTEROSCOPY WITH TRUCLEAR
Anesthesia: Spinal | Site: Vagina

## 2014-01-06 MED ORDER — ONDANSETRON HCL 4 MG/2ML IJ SOLN
INTRAMUSCULAR | Status: AC
  Filled 2014-01-06: qty 2

## 2014-01-06 MED ORDER — FENTANYL CITRATE 0.05 MG/ML IJ SOLN: 25.0000 ug | INTRAMUSCULAR | Status: DC | PRN

## 2014-01-06 MED ORDER — FENTANYL CITRATE 0.05 MG/ML IJ SOLN
INTRAMUSCULAR | Status: DC | PRN
  Administered 2014-01-06 (×2): 50 ug via INTRAVENOUS

## 2014-01-06 MED ORDER — PHENYLEPHRINE HCL 10 MG/ML IJ SOLN
INTRAMUSCULAR | Status: AC
  Filled 2014-01-06: qty 1

## 2014-01-06 MED ORDER — MIDAZOLAM HCL 2 MG/2ML IJ SOLN
INTRAMUSCULAR | Status: AC
  Filled 2014-01-06: qty 2

## 2014-01-06 MED ORDER — PHENYLEPHRINE 40 MCG/ML (10ML) SYRINGE FOR IV PUSH (FOR BLOOD PRESSURE SUPPORT)
PREFILLED_SYRINGE | INTRAVENOUS | Status: AC
  Filled 2014-01-06: qty 5

## 2014-01-06 MED ORDER — ONDANSETRON HCL 4 MG/2ML IJ SOLN
INTRAMUSCULAR | Status: DC | PRN
  Administered 2014-01-06: 4 mg via INTRAVENOUS

## 2014-01-06 MED ORDER — PHENYLEPHRINE HCL 10 MG/ML IJ SOLN
10.0000 mg | INTRAVENOUS | Status: DC | PRN
  Administered 2014-01-06: 20 ug/min via INTRAVENOUS

## 2014-01-06 MED ORDER — FENTANYL CITRATE 0.05 MG/ML IJ SOLN
INTRAMUSCULAR | Status: AC
  Filled 2014-01-06: qty 2

## 2014-01-06 MED ORDER — MIDAZOLAM HCL 5 MG/5ML IJ SOLN
INTRAMUSCULAR | Status: DC | PRN
  Administered 2014-01-06 (×2): 1 mg via INTRAVENOUS

## 2014-01-06 MED ORDER — SODIUM CHLORIDE 0.9 % IR SOLN
Status: DC | PRN
  Administered 2014-01-06: 3000 mL

## 2014-01-06 MED ORDER — STERILE WATER FOR IRRIGATION IR SOLN
Status: DC | PRN
  Administered 2014-01-06: 1000 mL

## 2014-01-06 MED ORDER — LACTATED RINGERS IV SOLN
INTRAVENOUS | Status: DC
  Administered 2014-01-06 (×2): via INTRAVENOUS

## 2014-01-06 MED ORDER — LIDOCAINE HCL 1 % IJ SOLN
INTRAMUSCULAR | Status: DC | PRN
  Administered 2014-01-06: 20 mL

## 2014-01-06 MED ORDER — LIDOCAINE HCL 1 % IJ SOLN
INTRAMUSCULAR | Status: AC
  Filled 2014-01-06: qty 20

## 2014-01-06 MED ORDER — BUPIVACAINE HCL (PF) 0.75 % IJ SOLN
INTRAMUSCULAR | Status: DC | PRN
  Administered 2014-01-06: 1.5 mL

## 2014-01-06 SURGICAL SUPPLY — 19 items
BLADE INCISOR TRUC PLUS 2.9 (ABLATOR) ×1 IMPLANT
CANISTERS HI-FLOW 3000CC (CANNISTER) ×9 IMPLANT
CATH ROBINSON RED A/P 14FR (CATHETERS) ×2 IMPLANT
CATH ROBINSON RED A/P 16FR (CATHETERS) ×6 IMPLANT
CLOTH BEACON ORANGE TIMEOUT ST (SAFETY) ×3 IMPLANT
CONTAINER PREFILL 10% NBF 60ML (FORM) ×6 IMPLANT
DRAPE HYSTEROSCOPY (DRAPE) ×3 IMPLANT
DRSG TELFA 3X8 NADH (GAUZE/BANDAGES/DRESSINGS) ×3 IMPLANT
GLOVE BIO SURGEON STRL SZ7.5 (GLOVE) ×6 IMPLANT
GOWN STRL REIN XL XLG (GOWN DISPOSABLE) ×6 IMPLANT
INCISOR TRUC PLUS BLADE 2.9 (ABLATOR) ×3
KIT HYSTEROSCOPY TRUCLEAR (ABLATOR) ×3 IMPLANT
MORCELLATOR RECIP TRUCLEAR 4.0 (ABLATOR) IMPLANT
NEEDLE SPNL 22GX3.5 QUINCKE BK (NEEDLE) ×3 IMPLANT
PACK VAGINAL MINOR WOMEN LF (CUSTOM PROCEDURE TRAY) ×3 IMPLANT
PAD OB MATERNITY 4.3X12.25 (PERSONAL CARE ITEMS) ×3 IMPLANT
SYR CONTROL 10ML LL (SYRINGE) ×3 IMPLANT
TOWEL OR 17X24 6PK STRL BLUE (TOWEL DISPOSABLE) ×6 IMPLANT
TRAY FOLEY CATH 14FR (SET/KITS/TRAYS/PACK) ×3 IMPLANT

## 2014-01-06 NOTE — Progress Notes (Signed)
No changes to H&P per patient history Reviewed with patient procedure, H/S, D&C and Truclear All questions answered

## 2014-01-06 NOTE — Preoperative (Signed)
Beta Blockers   Reason not to administer Beta Blockers:Patient told by Dr. Gaetano Net not to take correg this am, patient stated she would take correg at home after discharge.

## 2014-01-06 NOTE — Discharge Instructions (Signed)
CALL YOUR DOCTOR IF YOU ARE UNABLE TO URINATE BY 5:00 PM TODAY.

## 2014-01-06 NOTE — Anesthesia Preprocedure Evaluation (Signed)
Anesthesia Evaluation  Patient identified by MRN, date of birth, ID band Patient awake    Reviewed: Allergy & Precautions, H&P , Patient's Chart, lab work & pertinent test results  Airway Mallampati: IV TM Distance: >3 FB Neck ROM: full    Dental no notable dental hx.    Pulmonary asthma , sleep apnea and Continuous Positive Airway Pressure Ventilation ,  breath sounds clear to auscultation  Pulmonary exam normal       Cardiovascular Exercise Tolerance: Good On Medications Rhythm:regular Rate:Normal     Neuro/Psych    GI/Hepatic   Endo/Other    Renal/GU      Musculoskeletal   Abdominal   Peds  Hematology   Anesthesia Other Findings   Reproductive/Obstetrics                           Anesthesia Physical Anesthesia Plan  ASA: III  Anesthesia Plan: Spinal   Post-op Pain Management:    Induction:   Airway Management Planned:   Additional Equipment:   Intra-op Plan:   Post-operative Plan:   Informed Consent: I have reviewed the patients History and Physical, chart, labs and discussed the procedure including the risks, benefits and alternatives for the proposed anesthesia with the patient or authorized representative who has indicated his/her understanding and acceptance.   Dental Advisory Given  Plan Discussed with: CRNA  Anesthesia Plan Comments: (Lab work confirmed with CRNA in room. Platelets okay. Discussed spinal anesthetic, and patient consents to the procedure:  included risk of possible headache,backache, failed block, allergic reaction, and nerve injury. This patient was asked if she had any questions or concerns before the procedure started. )        Anesthesia Quick Evaluation

## 2014-01-06 NOTE — Brief Op Note (Signed)
01/06/2014  8:06 AM  PATIENT:  Tamara [name]  63 y.o. female  PRE-OPERATIVE DIAGNOSIS:  Postmenopausal Bleeding  POST-OPERATIVE DIAGNOSIS:  Postmenopausal Bleeding  PROCEDURE:  Procedure(s): DILATATION & CURETTAGE/HYSTEROSCOPY WITH TRUCLEAR (N/A)  SURGEON:  Surgeon(s) and Role:    * Allena Katz, MD - Primary  PHYSICIAN ASSISTANT:   ASSISTANTS: none   ANESTHESIA:   spinal  EBL:  Total I/O In: 500 [I.V.:500] Out: -   BLOOD ADMINISTERED:none  DRAINS: Urinary Catheter (Foley)   LOCAL MEDICATIONS USED:  LIDOCAINE   SPECIMEN:  Source of Specimen:  endometrial resection,endometrial currettings  DISPOSITION OF SPECIMEN:  PATHOLOGY  COUNTS:  YES  TOURNIQUET:  * No tourniquets in log *  DICTATION: .Other Dictation: Dictation Number [id number]  PLAN OF CARE: Discharge to home after PACU  PATIENT DISPOSITION:  PACU - hemodynamically stable.   Delay start of Pharmacological VTE agent (>24hrs) due to surgical blood loss or risk of bleeding: not applicable

## 2014-01-06 NOTE — Anesthesia Postprocedure Evaluation (Signed)
Anesthesia Post Note  Patient: Tamara [name]  Procedure(s) Performed: Procedure(s) (LRB): DILATATION & CURETTAGE/HYSTEROSCOPY WITH TRUCLEAR (N/A)  Anesthesia type: Spinal  Patient location: PACU  Post pain: Pain level controlled  Post assessment: Post-op Vital signs reviewed  Last Vitals:  Filed Vitals:   01/06/14 0811  BP: 140/72  Pulse:   Temp: 36.6 C  Resp:     Post vital signs: Reviewed  Level of consciousness: awake  Complications: No apparent anesthesia complications  * patient's hypertension is poorly controlled and she needs to see her primary asap to get this corrected as she is at risk for long term sequelae if left untreated

## 2014-01-06 NOTE — Transfer of Care (Signed)
Immediate Anesthesia Transfer of Care Note  Patient: Tamara [name]  Procedure(s) Performed: Procedure(s): DILATATION & CURETTAGE/HYSTEROSCOPY WITH TRUCLEAR (N/A)  Patient Location: PACU  Anesthesia Type:Spinal  Level of Consciousness: awake, alert  and oriented  Airway & Oxygen Therapy: Patient Spontanous Breathing and Patient connected to nasal cannula oxygen  Post-op Assessment: Report given to PACU RN and Post -op Vital signs reviewed and stable  Post vital signs: Reviewed and stable  Complications: No apparent anesthesia complications

## 2014-01-06 NOTE — Anesthesia Procedure Notes (Addendum)
Spinal  Patient location during procedure: OR Staffing Anesthesiologist: Rudean Curt Performed by: anesthesiologist  Preanesthetic Checklist Completed: patient identified, site marked, surgical consent, pre-op evaluation, timeout performed, IV checked, risks and benefits discussed and monitors and equipment checked Spinal Block Patient position: sitting Prep: DuraPrep Patient monitoring: heart rate, cardiac monitor, continuous pulse ox and blood pressure Approach: midline Location: L2-3 Injection technique: single-shot Needle Needle type: Sprotte  Needle gauge: 24 G Needle length: 9 cm Assessment Sensory level: T4 Additional Notes Patient identified.  Risk benefits discussed including failed block, incomplete pain control, headache, nerve damage, paralysis, blood pressure changes, nausea, vomiting, reactions to medication both toxic or allergic, and postpartum back pain.  Patient expressed understanding and wished to proceed.  All questions were answered.  Sterile technique used throughout procedure.  CSF was clear.  No parasthesia or other complications.  Please see nursing notes for vital signs.

## 2014-01-09 ENCOUNTER — Encounter (HOSPITAL_COMMUNITY): Payer: Self-pay | Admitting: Obstetrics and Gynecology

## 2014-01-09 NOTE — Op Note (Deleted)
NAME:  Tamara [name], Tamara [name]               ACCOUNT NO.:  [id number]  MEDICAL RECORD NO.:  [id number]  LOCATION:                                 FACILITY:  PHYSICIAN:  Ladarren Steiner E. Kyler Lerette, M.D. DATE OF BIRTH:  03/24/1951  DATE OF PROCEDURE:  01/06/2014 DATE OF DISCHARGE:  01/06/2014                              OPERATIVE REPORT   PREOPERATIVE DIAGNOSIS:  Postmenopausal bleeding.  POSTOPERATIVE DIAGNOSIS:  Postmenopausal bleeding.  PROCEDURE:  Hysteroscopy with critically resection of endometrial mass, dilation and curettage.  SURGEON:  Dayyan Krist E. Nevah Dalal, M.D.  ANESTHESIA:  Spinal.  ESTIMATED BLOOD LOSS:  Less than 100 mL.  SPECIMENS: 1. Endometrial resection. 2. Endometrial curettings both to pathology.  I and o's of distending     media 140 mL deficit with some of that being on the floor.  INDICATIONS AND CONSENT:  This patient is a 62-year-old patient, who has had postmenopausal bleeding.  Ultrasound was suggestive of an 8 mm endometrial mass.  Hysteroscopy D and C with TRUCLEAR resectoscope was discussed preoperatively.  Potential risks and complications were reviewed preoperatively including not limited to, infection, uterine perforation, organ damage, bleeding requiring transfusion of blood products with HIV and hepatitis acquisition, DVT, PE, pneumonia, laparoscopy, laparotomy, persistent or recurrent postmenopausal bleeding.  All questions were answered and consent signed on the chart.  FINDINGS:  Both fallopian tube ostia are identified.  There is an 10 cm broad-based polypoid type mass in the right lower anterior endometrial cavity.  PROCEDURE IN DETAIL:  The patient was taken operating room, where she was identified.  Spinal anesthetic was placed per Dr. Freeman Jackson and she was placed in a dorsal lithotomy position.  Time-out undertaken. She was prepped, bladder straight catheterized, and she was draped in a sterile fashion.  Bivalve speculum was placed in the vagina.   Anterior cervical lip was injected with 1% plain Xylocaine and grasped with single-tooth tenaculum.  Paracervical block was placed at 2, 4, 5, 7, 8, and 10 o'clock positions with approximately 20 mL of the same solution. Cervix was gently and progressively dilated to a 19 dilator.  The TRUCLEAR resectoscope was then placed in the endocervical canal and advanced under direct visualization without difficulty using distending media.  The above findings were noted.  The 2.9 mm TRUCLEAR resectoscope was then used to resect the mass on the anterior lower uterine segment. This was proceeded without difficulty.  Hysteroscope was removed and sharp curettage was carried out for scant endometrial tissue.  Instruments were removed.  Good hemostasis was noted, and all counts were correct. The Foley catheter was placed in the bladder.  The patient then transferred to recovery room in stable condition.     Romaldo Saville E. Jarrad Mclees, M.D.     JET/MEDQ  D:  01/06/2014  T:  01/07/2014  Job:  [id number] 

## 2014-01-09 NOTE — Op Note (Signed)
NAME:  Tamara [name], Tamara [name]               ACCOUNT NO.:  [id number]  MEDICAL RECORD NO.:  [id number]  LOCATION:                                 FACILITY:  PHYSICIAN:  Verle Wheeling E. Umer Harig, M.D. DATE OF BIRTH:  11/22/1951  DATE OF PROCEDURE:  01/06/2014 DATE OF DISCHARGE:  01/06/2014                              OPERATIVE REPORT   PREOPERATIVE DIAGNOSIS:  Postmenopausal bleeding.  POSTOPERATIVE DIAGNOSIS:  Postmenopausal bleeding.  PROCEDURE:  Hysteroscopy with critically resection of endometrial mass, dilation and curettage.  SURGEON:  Jozlin [name] E. Arlene Genova, M.D.  ANESTHESIA:  Spinal.  ESTIMATED BLOOD LOSS:  Less than 100 mL.  SPECIMENS: 1. Endometrial resection. 2. Endometrial curettings both to pathology.  I and o's of distending     media 140 mL deficit with some of that being on the floor.  INDICATIONS AND CONSENT:  This patient is a 62-year-old patient, who has had postmenopausal bleeding.  Ultrasound was suggestive of an 8 mm endometrial mass.  Hysteroscopy D and C with TRUCLEAR resectoscope was discussed preoperatively.  Potential risks and complications were reviewed preoperatively including not limited to, infection, uterine perforation, organ damage, bleeding requiring transfusion of blood products with HIV and hepatitis acquisition, DVT, PE, pneumonia, laparoscopy, laparotomy, persistent or recurrent postmenopausal bleeding.  All questions were answered and consent signed on the chart.  FINDINGS:  Both fallopian tube ostia are identified.  There is an 10 cm broad-based polypoid type mass in the right lower anterior endometrial cavity.  PROCEDURE IN DETAIL:  The patient was taken operating room, where she was identified.  Spinal anesthetic was placed per Dr. Freeman Jackson and she was placed in a dorsal lithotomy position.  Time-out undertaken. She was prepped, bladder straight catheterized, and she was draped in a sterile fashion.  Bivalve speculum was placed in the vagina.   Anterior cervical lip was injected with 1% plain Xylocaine and grasped with single-tooth tenaculum.  Paracervical block was placed at 2, 4, 5, 7, 8, and 10 o'clock positions with approximately 20 mL of the same solution. Cervix was gently and progressively dilated to a 19 dilator.  The TRUCLEAR resectoscope was then placed in the endocervical canal and advanced under direct visualization without difficulty using distending media.  The above findings were noted.  The 2.9 mm TRUCLEAR resectoscope was then used to resect the mass on the anterior lower uterine segment. This was proceeded without difficulty.  Hysteroscope was removed and sharp curettage was carried out for scant endometrial tissue.  Instruments were removed.  Good hemostasis was noted, and all counts were correct. The Foley catheter was placed in the bladder.  The patient then transferred to recovery room in stable condition.     [name] E. Tukker Byrns, M.D.     JET/MEDQ  D:  01/06/2014  T:  01/07/2014  Job:  [id number] 

## 2014-01-09 NOTE — Op Note (Deleted)
NAMEMARGAREE, SANDHU               ACCOUNT NO.:  [id number]  MEDICAL RECORD NO.:  [id number]  LOCATION:                                 FACILITY:  PHYSICIAN:  Daleen Bo. Gaetano Net, M.D. DATE OF BIRTH:  07-03-1951  DATE OF PROCEDURE:  01/06/2014 DATE OF DISCHARGE:  01/06/2014                              OPERATIVE REPORT   PREOPERATIVE DIAGNOSIS:  Postmenopausal bleeding.  POSTOPERATIVE DIAGNOSIS:  Postmenopausal bleeding.  PROCEDURE:  Hysteroscopy with critically resection of endometrial mass, dilation and curettage.  SURGEON:  Daleen Bo. Gaetano Net, M.D.  ANESTHESIA:  Spinal.  ESTIMATED BLOOD LOSS:  Less than 100 mL.  SPECIMENS: 1. Endometrial resection. 2. Endometrial curettings both to pathology.  I and o's of distending     media 140 mL deficit with some of that being on the floor.  INDICATIONS AND CONSENT:  This patient is a 63 year old patient, who has had postmenopausal bleeding.  Ultrasound was suggestive of an 8 mm endometrial mass.  Hysteroscopy D and C with TRUCLEAR resectoscope was discussed preoperatively.  Potential risks and complications were reviewed preoperatively including not limited to, infection, uterine perforation, organ damage, bleeding requiring transfusion of blood products with HIV and hepatitis acquisition, DVT, PE, pneumonia, laparoscopy, laparotomy, persistent or recurrent postmenopausal bleeding.  All questions were answered and consent signed on the chart.  FINDINGS:  Both fallopian tube ostia are identified.  There is an 10 cm broad-based polypoid type mass in the right lower anterior endometrial cavity.  PROCEDURE IN DETAIL:  The patient was taken operating room, where she was identified.  Spinal anesthetic was placed per Dr. Rudean Curt and she was placed in a dorsal lithotomy position.  Time-out undertaken. She was prepped, bladder straight catheterized, and she was draped in a sterile fashion.  Bivalve speculum was placed in the vagina.   Anterior cervical lip was injected with 1% plain Xylocaine and grasped with single-tooth tenaculum.  Paracervical block was placed at 2, 4, 5, 7, 8, and 10 o'clock positions with approximately 20 mL of the same solution. Cervix was gently and progressively dilated to a 19 dilator.  The TRUCLEAR resectoscope was then placed in the endocervical canal and advanced under direct visualization without difficulty using distending media.  The above findings were noted.  The 2.9 mm TRUCLEAR resectoscope was then used to resect the mass on the anterior lower uterine segment. This was proceeded without difficulty.  Hysteroscope was removed and sharp curettage was carried out for scant endometrial tissue.  Instruments were removed.  Good hemostasis was noted, and all counts were correct. The Foley catheter was placed in the bladder.  The patient then transferred to recovery room in stable condition.     Daleen Bo Gaetano Net, M.D.     JET/MEDQ  D:  01/06/2014  T:  01/07/2014  Job:  [id number]

## 2014-01-09 NOTE — Op Note (Deleted)
NAME:  [name], Tamara               ACCOUNT NO.:  [id number]  MEDICAL RECORD NO.:  [id number]  LOCATION:                                 FACILITY:  PHYSICIAN:  Trisa Cranor E. Branton Einstein, M.D. DATE OF BIRTH:  04/24/1951  DATE OF PROCEDURE:  01/06/2014 DATE OF DISCHARGE:  01/06/2014                              OPERATIVE REPORT   PREOPERATIVE DIAGNOSIS:  Postmenopausal bleeding.  POSTOPERATIVE DIAGNOSIS:  Postmenopausal bleeding.  PROCEDURE:  Hysteroscopy with critically resection of endometrial mass, dilation and curettage.  SURGEON:  Shayden Bobier E. Ceaser Ebeling, M.D.  ANESTHESIA:  Spinal.  ESTIMATED BLOOD LOSS:  Less than 100 mL.  SPECIMENS: 1. Endometrial resection. 2. Endometrial curettings both to pathology.  I and o's of distending     media 140 mL deficit with some of that being on the floor.  INDICATIONS AND CONSENT:  This patient is a 62-year-old patient, who has had postmenopausal bleeding.  Ultrasound was suggestive of an 8 mm endometrial mass.  Hysteroscopy D and C with TRUCLEAR resectoscope was discussed preoperatively.  Potential risks and complications were reviewed preoperatively including not limited to, infection, uterine perforation, organ damage, bleeding requiring transfusion of blood products with HIV and hepatitis acquisition, DVT, PE, pneumonia, laparoscopy, laparotomy, persistent or recurrent postmenopausal bleeding.  All questions were answered and consent signed on the chart.  FINDINGS:  Both fallopian tube ostia are identified.  There is an 10 cm broad-based polypoid type mass in the right lower anterior endometrial cavity.  PROCEDURE IN DETAIL:  The patient was taken operating room, where she was identified.  Spinal anesthetic was placed per Dr. Freeman Jackson and she was placed in a dorsal lithotomy position.  Time-out undertaken. She was prepped, bladder straight catheterized, and she was draped in a sterile fashion.  Bivalve speculum was placed in the vagina.   Anterior cervical lip was injected with 1% plain Xylocaine and grasped with single-tooth tenaculum.  Paracervical block was placed at 2, 4, 5, 7, 8, and 10 o'clock positions with approximately 20 mL of the same solution. Cervix was gently and progressively dilated to a 19 dilator.  The TRUCLEAR resectoscope was then placed in the endocervical canal and advanced under direct visualization without difficulty using distending media.  The above findings were noted.  The 2.9 mm TRUCLEAR resectoscope was then used to resect the mass on the anterior lower uterine segment. This was proceeded without difficulty.  Hysteroscope was removed and sharp curettage was carried out for scant endometrial tissue.  Instruments were removed.  Good hemostasis was noted, and all counts were correct. The Foley catheter was placed in the bladder.  The patient then transferred to recovery room in stable condition.     Yerik Zeringue E. Laverne Hursey, M.D.     JET/MEDQ  D:  01/06/2014  T:  01/07/2014  Job:  [id number] 

## 2014-01-12 ENCOUNTER — Other Ambulatory Visit: Payer: Self-pay | Admitting: Family Medicine

## 2014-01-16 ENCOUNTER — Encounter (HOSPITAL_COMMUNITY)
Admission: RE | Admit: 2014-01-16 | Discharge: 2014-01-16 | Disposition: A | Discharge status: Home / Self Care | Payer: BC Managed Care – PPO | Source: Ambulatory Visit | Attending: General Surgery | Admitting: General Surgery

## 2014-01-16 ENCOUNTER — Encounter (HOSPITAL_COMMUNITY): Payer: Self-pay

## 2014-01-16 ENCOUNTER — Ambulatory Visit (HOSPITAL_COMMUNITY)
Admission: RE | Admit: 2014-01-16 | Discharge: 2014-01-16 | Disposition: A | Discharge status: Home / Self Care | Payer: BC Managed Care – PPO | Source: Ambulatory Visit | Attending: Anesthesiology | Admitting: Anesthesiology

## 2014-01-16 DIAGNOSIS — Z01812 Encounter for preprocedural laboratory examination: Secondary | ICD-10-CM | POA: Insufficient documentation

## 2014-01-16 DIAGNOSIS — Z01818 Encounter for other preprocedural examination: Secondary | ICD-10-CM | POA: Insufficient documentation

## 2014-01-16 DIAGNOSIS — I517 Cardiomegaly: Secondary | ICD-10-CM | POA: Insufficient documentation

## 2014-01-16 HISTORY — DX: Localized edema: R60.0

## 2014-01-16 HISTORY — DX: Edema, unspecified: R60.9

## 2014-01-16 HISTORY — DX: Effusion, unspecified joint: M25.40

## 2014-01-16 HISTORY — DX: Other complications of anesthesia, initial encounter: T88.59XA

## 2014-01-16 HISTORY — DX: Adverse effect of unspecified anesthetic, initial encounter: T41.45XA

## 2014-01-16 HISTORY — DX: Pain in unspecified joint: M25.50

## 2014-01-16 HISTORY — DX: Hyperlipidemia, unspecified: E78.5

## 2014-01-16 HISTORY — DX: Personal history of other diseases of the respiratory system: Z87.09

## 2014-01-16 LAB — BASIC METABOLIC PANEL
BUN: 18 mg/dL (ref 6–23)
CO2: 28 mEq/L (ref 19–32)
Calcium: 9.1 mg/dL (ref 8.4–10.5)
Chloride: 101 mEq/L (ref 96–112)
Creatinine, Ser: 0.88 mg/dL (ref 0.50–1.10)
GFR calc Af Amer: 79 mL/min — ABNORMAL LOW (ref >90)
GFR, EST NON AFRICAN AMERICAN: 68 mL/min — AB (ref >90)
Glucose, Bld: 113 mg/dL — ABNORMAL HIGH (ref 70–99)
Potassium: 4.4 mEq/L (ref 3.7–5.3)
Sodium: 140 mEq/L (ref 137–147)

## 2014-01-16 LAB — CBC
HCT: 42.3 % (ref 36.0–46.0)
Hemoglobin: 14.1 g/dL (ref 12.0–15.0)
MCH: 28.7 pg (ref 26.0–34.0)
MCHC: 33.3 g/dL (ref 30.0–36.0)
MCV: 86 fL (ref 78.0–100.0)
Platelets: 195 10*3/uL (ref 150–400)
RBC: 4.92 MIL/uL (ref 3.87–5.11)
RDW: 13.5 % (ref 11.5–15.5)
WBC: 7 10*3/uL (ref 4.0–10.5)

## 2014-01-16 MED ORDER — CHLORHEXIDINE GLUCONATE 4 % EX LIQD: 1.0000 "application " | Freq: Once | CUTANEOUS | Status: DC

## 2014-01-16 NOTE — Progress Notes (Addendum)
  Pt doesn't have a cardiologist   Denies ever having an echo/stress test/heart cath  Denies CXR in past yr  Medical Md is Dr.Jeffrey Todd  EKG in epic from 09-27-13  Sleep study in epic from 2014

## 2014-01-16 NOTE — Pre-Procedure Instructions (Signed)
Tamara [name]  01/16/2014   Your procedure is scheduled on:  Tues, Feb 24 @ 9:30 AM  Report to [facility]trance A  at 7:30 AM.  Call this number if you have problems the morning of surgery: [phone]   Remember:   Do not eat food or drink liquids after midnight.   Take these medicines the morning of surgery with A SIP OF WATER: Carvedilol(Coreg),Diflucan(Fluconazole-if needed),Paxil(Paroxetine),Ventolin<Bring Your Inhaler With You>,and Tramadol(Ultram)              Stop taking your Ibuprofen and Aspirin. No Goody's,BC's,Aleve,Fish Oil,or any Herbal Medications   Do not wear jewelry, make-up or nail polish.  Do not wear lotions, powders, or perfumes. You may wear deodorant.  Do not shave 48 hours prior to surgery.   Do not bring valuables to the hospital.  [facility] is not responsible                  for any belongings or valuables.               Contacts, dentures or bridgework may not be worn into surgery.  Leave suitcase in the car. After surgery it may be brought to your room.  For patients admitted to the hospital, discharge time is determined by your                treatment team.               Patients discharged the day of surgery will not be allowed to drive  home.    Special Instructions:  [place] - Preparing for Surgery  Before surgery, you can play an important role.  Because skin is not sterile, your skin needs to be as free of germs as possible.  You can reduce the number of germs on you skin by washing with CHG (chlorahexidine gluconate) soap before surgery.  CHG is an antiseptic cleaner which kills germs and bonds with the skin to continue killing germs even after washing.  Please DO NOT use if you have an allergy to CHG or antibacterial soaps.  If your skin becomes reddened/irritated stop using the CHG and inform your nurse when you arrive at Short Stay.  Do not shave (including legs and underarms) for at least 48 hours prior to the first CHG  shower.  You may shave your face.  Please follow these instructions carefully:   1.  Shower with CHG Soap the night before surgery and the                                morning of Surgery.  2.  If you choose to wash your hair, wash your hair first as usual with your       normal shampoo.  3.  After you shampoo, rinse your hair and body thoroughly to remove the                      Shampoo.  4.  Use CHG as you would any other liquid soap.  You can apply chg directly       to the skin and wash gently with scrungie or a clean washcloth.  5.  Apply the CHG Soap to your body ONLY FROM THE NECK DOWN.        Do not use on open wounds or open sores.  Avoid contact with your eyes,  ears, mouth and genitals (private parts).  Wash genitals (private parts)       with your normal soap.  6.  Wash thoroughly, paying special attention to the area where your surgery        will be performed.  7.  Thoroughly rinse your body with warm water from the neck down.  8.  DO NOT shower/wash with your normal soap after using and rinsing off       the CHG Soap.  9.  Pat yourself dry with a clean towel.            10.  Wear clean pajamas.            11.  Place clean sheets on your bed the night of your first shower and do not        sleep with pets.  Day of Surgery  Do not apply any lotions/deoderants the morning of surgery.  Please wear clean clothes to the hospital/surgery center.     Please read over the following fact sheets that you were given: Pain Booklet, Coughing and Deep Breathing and Surgical Site Infection Prevention

## 2014-01-17 ENCOUNTER — Encounter (HOSPITAL_COMMUNITY): Payer: Self-pay

## 2014-01-17 NOTE — Progress Notes (Signed)
Anesthesia Note:  Patient is a 63 year old scheduled for needle localized left breast excisional biopsy for abnormal calcifications on 2/01/04/14 by Dr. Excell Seltzer. Due to the patient's weight this was unable to be biopsied stereotactically and open excisional biopsy was recommended  History includes HTN, OSA with CPAP use, peripheral edema, asthma, depression, arthritis, hearing loss, HLD, right TKA '10. She is morbidly obese with BMI of 51.82. PCP is Dr. Stevie Kern.     She is s/p hysteroscopy with resection of endometrial mass (benign polyp) and D&C on 01/06/14.  She reports that her anesthesiologist that day was concerned about giving her general anesthesia due to her OSA history.  (She reports he "refused" to give her GA due to her OSA history, and that he also commented that her mouth was somewhat small.)  Procedure was ultimately done via spinal.    EKG 09/27/13 showed SB at 59 bpm, old inferior infarct, old anterior infarct.  Negative T wave in aVL. EKG ws done during her yearly physical with Dr. Sherren Mocha, and no new testing was ordered.  I think her EKG is stable since at least 08/08/11.  She denied chest pain and SOB at rest or new cardiopulmonary symptoms.  She denied prior cardiac testing.    CXR report on 01/16/14 showed: No active infiltrate or effusion is seen. There is some peribronchial thickening which may indicate bronchitis. Cardiomegaly is stable. No acute bony abnormality is seen.   Preoperative labs noted.    Patient's EKG appears stable.  No CV symptoms.  She tolerated GYN surgery earlier this month.  Anticipate general anesthesia.  She does have OSA history with CPAP use.  I'm not sure why reportedly her previous anesthesiologist "refused" her GA on the basis of OSA history, but I did tell her that patient's with OSA history and anesthesia (particularly GA or sedation) need close monitoring post-operatively and occasionally need to be admitted overnight if any obstructive symptoms in  PACU.  Further evaluation and definitive anesthesia plan by her assigned anesthesiologist on the day of surgery.  George Hugh [facility] Short Stay Center/Anesthesiology Phone [phone] 01/17/2014 1:04 PM

## 2014-01-23 ENCOUNTER — Encounter (HOSPITAL_COMMUNITY): Payer: Self-pay

## 2014-01-24 ENCOUNTER — Ambulatory Visit (HOSPITAL_COMMUNITY): Payer: BC Managed Care – PPO | Admitting: Certified Registered Nurse Anesthetist

## 2014-01-24 ENCOUNTER — Encounter (HOSPITAL_COMMUNITY): Payer: Self-pay | Admitting: Anesthesiology

## 2014-01-24 ENCOUNTER — Encounter (HOSPITAL_COMMUNITY)
Admission: RE | Disposition: A | Discharge status: Home / Self Care | Payer: Self-pay | Source: Ambulatory Visit | Attending: General Surgery

## 2014-01-24 ENCOUNTER — Ambulatory Visit (HOSPITAL_COMMUNITY)
Admission: RE | Admit: 2014-01-24 | Discharge: 2014-01-24 | Disposition: A | Discharge status: Home / Self Care | Payer: BC Managed Care – PPO | Source: Ambulatory Visit | Attending: General Surgery | Admitting: General Surgery

## 2014-01-24 ENCOUNTER — Encounter (HOSPITAL_COMMUNITY): Payer: BC Managed Care – PPO | Admitting: Vascular Surgery

## 2014-01-24 ENCOUNTER — Ambulatory Visit
Admission: RE | Admit: 2014-01-24 | Discharge: 2014-01-24 | Disposition: A | Discharge status: Home / Self Care | Payer: BC Managed Care – PPO | Source: Ambulatory Visit | Attending: General Surgery | Admitting: General Surgery

## 2014-01-24 DIAGNOSIS — R920 Mammographic microcalcification found on diagnostic imaging of breast: Secondary | ICD-10-CM

## 2014-01-24 DIAGNOSIS — Z7982 Long term (current) use of aspirin: Secondary | ICD-10-CM | POA: Insufficient documentation

## 2014-01-24 DIAGNOSIS — I10 Essential (primary) hypertension: Secondary | ICD-10-CM | POA: Insufficient documentation

## 2014-01-24 DIAGNOSIS — D0590 Unspecified type of carcinoma in situ of unspecified breast: Secondary | ICD-10-CM | POA: Insufficient documentation

## 2014-01-24 DIAGNOSIS — G4733 Obstructive sleep apnea (adult) (pediatric): Secondary | ICD-10-CM | POA: Insufficient documentation

## 2014-01-24 DIAGNOSIS — E669 Obesity, unspecified: Secondary | ICD-10-CM | POA: Insufficient documentation

## 2014-01-24 DIAGNOSIS — F329 Major depressive disorder, single episode, unspecified: Secondary | ICD-10-CM | POA: Insufficient documentation

## 2014-01-24 DIAGNOSIS — F3289 Other specified depressive episodes: Secondary | ICD-10-CM | POA: Insufficient documentation

## 2014-01-24 HISTORY — PX: BREAST LUMPECTOMY WITH NEEDLE LOCALIZATION: SHX5759

## 2014-01-24 SURGERY — BREAST LUMPECTOMY WITH NEEDLE LOCALIZATION
Anesthesia: General | Site: Breast | Laterality: Left

## 2014-01-24 MED ORDER — PROPOFOL 10 MG/ML IV BOLUS
INTRAVENOUS | Status: DC | PRN
  Administered 2014-01-24: 200 mg via INTRAVENOUS

## 2014-01-24 MED ORDER — HYDROMORPHONE HCL PF 1 MG/ML IJ SOLN: 0.2500 mg | INTRAMUSCULAR | Status: DC | PRN

## 2014-01-24 MED ORDER — BUPIVACAINE-EPINEPHRINE 0.25% -1:200000 IJ SOLN
INTRAMUSCULAR | Status: DC | PRN
  Administered 2014-01-24: 30 mL

## 2014-01-24 MED ORDER — FENTANYL CITRATE 0.05 MG/ML IJ SOLN
INTRAMUSCULAR | Status: DC | PRN
  Administered 2014-01-24 (×2): 50 ug via INTRAVENOUS

## 2014-01-24 MED ORDER — FENTANYL CITRATE 0.05 MG/ML IJ SOLN
INTRAMUSCULAR | Status: AC
  Filled 2014-01-24: qty 5

## 2014-01-24 MED ORDER — LACTATED RINGERS IV SOLN
INTRAVENOUS | Status: DC | PRN
  Administered 2014-01-24: 10:00:00 via INTRAVENOUS

## 2014-01-24 MED ORDER — 0.9 % SODIUM CHLORIDE (POUR BTL) OPTIME
TOPICAL | Status: DC | PRN
  Administered 2014-01-24: 1000 mL

## 2014-01-24 MED ORDER — PROPOFOL 10 MG/ML IV BOLUS
INTRAVENOUS | Status: AC
  Filled 2014-01-24: qty 20

## 2014-01-24 MED ORDER — ONDANSETRON HCL 4 MG/2ML IJ SOLN: 4.0000 mg | Freq: Once | INTRAMUSCULAR | Status: DC | PRN

## 2014-01-24 MED ORDER — HEPARIN SODIUM (PORCINE) 5000 UNIT/ML IJ SOLN
5000.0000 [IU] | Freq: Once | INTRAMUSCULAR | Status: AC
  Administered 2014-01-24: 5000 [IU] via SUBCUTANEOUS

## 2014-01-24 MED ORDER — BUPIVACAINE-EPINEPHRINE (PF) 0.5% -1:200000 IJ SOLN
INTRAMUSCULAR | Status: AC
  Filled 2014-01-24: qty 10

## 2014-01-24 MED ORDER — EPHEDRINE SULFATE 50 MG/ML IJ SOLN
INTRAMUSCULAR | Status: DC | PRN
  Administered 2014-01-24 (×3): 10 mg via INTRAVENOUS

## 2014-01-24 MED ORDER — PROPOFOL 10 MG/ML IV BOLUS
INTRAVENOUS | Status: AC
Start: 2014-01-24 — End: 2014-01-24
  Filled 2014-01-24: qty 20

## 2014-01-24 MED ORDER — MIDAZOLAM HCL 2 MG/2ML IJ SOLN
INTRAMUSCULAR | Status: AC
  Filled 2014-01-24: qty 2

## 2014-01-24 MED ORDER — GLYCOPYRROLATE 0.2 MG/ML IJ SOLN
INTRAMUSCULAR | Status: DC | PRN
  Administered 2014-01-24: 0.2 mg via INTRAVENOUS

## 2014-01-24 MED ORDER — HYDROCODONE-ACETAMINOPHEN 5-325 MG PO TABS: 1.0000 | ORAL_TABLET | ORAL | Status: DC | PRN

## 2014-01-24 MED ORDER — ONDANSETRON HCL 4 MG/2ML IJ SOLN
INTRAMUSCULAR | Status: AC
  Filled 2014-01-24: qty 2

## 2014-01-24 MED ORDER — EPHEDRINE SULFATE 50 MG/ML IJ SOLN
INTRAMUSCULAR | Status: AC
  Filled 2014-01-24: qty 1

## 2014-01-24 MED ORDER — ONDANSETRON HCL 4 MG/2ML IJ SOLN
INTRAMUSCULAR | Status: DC | PRN
  Administered 2014-01-24: 4 mg via INTRAVENOUS

## 2014-01-24 MED ORDER — GLYCOPYRROLATE 0.2 MG/ML IJ SOLN
INTRAMUSCULAR | Status: AC
  Filled 2014-01-24: qty 2

## 2014-01-24 MED ORDER — LACTATED RINGERS IV SOLN
INTRAVENOUS | Status: DC
  Administered 2014-01-24: 10:00:00 via INTRAVENOUS

## 2014-01-24 MED ORDER — BUPIVACAINE-EPINEPHRINE PF 0.25-1:200000 % IJ SOLN
INTRAMUSCULAR | Status: AC
  Filled 2014-01-24: qty 30

## 2014-01-24 MED ORDER — HEPARIN SODIUM (PORCINE) 5000 UNIT/ML IJ SOLN
INTRAMUSCULAR | Status: AC
  Administered 2014-01-24: 5000 [IU] via SUBCUTANEOUS
  Filled 2014-01-24: qty 1

## 2014-01-24 MED ORDER — CEFAZOLIN SODIUM-DEXTROSE 2-3 GM-% IV SOLR
INTRAVENOUS | Status: AC
  Administered 2014-01-24: 2 g via INTRAVENOUS
  Filled 2014-01-24: qty 50

## 2014-01-24 MED ORDER — LIDOCAINE HCL (CARDIAC) 20 MG/ML IV SOLN
INTRAVENOUS | Status: DC | PRN
  Administered 2014-01-24: 100 mg via INTRAVENOUS

## 2014-01-24 SURGICAL SUPPLY — 43 items
ADH SKN CLS APL DERMABOND .7 (GAUZE/BANDAGES/DRESSINGS) ×1
BLADE SURG 10 STRL SS (BLADE) IMPLANT
BLADE SURG 15 STRL LF DISP TIS (BLADE) ×1 IMPLANT
BLADE SURG 15 STRL SS (BLADE)
CANISTER SUCTION 2500CC (MISCELLANEOUS) ×3 IMPLANT
CHLORAPREP W/TINT 26ML (MISCELLANEOUS) ×3 IMPLANT
CLIP TI MEDIUM 6 (CLIP) ×3 IMPLANT
COVER SURGICAL LIGHT HANDLE (MISCELLANEOUS) ×3 IMPLANT
DERMABOND ADVANCED (GAUZE/BANDAGES/DRESSINGS) ×2
DERMABOND ADVANCED .7 DNX12 (GAUZE/BANDAGES/DRESSINGS) ×1 IMPLANT
DEVICE DUBIN SPECIMEN MAMMOGRA (MISCELLANEOUS) ×3 IMPLANT
DRAPE CHEST BREAST 15X10 FENES (DRAPES) ×3 IMPLANT
DRAPE UTILITY 15X26 W/TAPE STR (DRAPE) ×6 IMPLANT
ELECT COATED BLADE 2.86 ST (ELECTRODE) ×3 IMPLANT
ELECT REM PT RETURN 9FT ADLT (ELECTROSURGICAL) ×3
ELECTRODE REM PT RTRN 9FT ADLT (ELECTROSURGICAL) ×1 IMPLANT
GLOVE BIO SURGEON STRL SZ7.5 (GLOVE) ×3 IMPLANT
GLOVE BIOGEL PI IND STRL 8 (GLOVE) ×1 IMPLANT
GLOVE BIOGEL PI INDICATOR 8 (GLOVE) ×2
GLOVE SS BIOGEL STRL SZ 7.5 (GLOVE) ×2 IMPLANT
GLOVE SUPERSENSE BIOGEL SZ 7.5 (GLOVE) ×4
GLOVE SURG SS PI 7.0 STRL IVOR (GLOVE) ×3 IMPLANT
GOWN STRL NON-REIN LRG LVL3 (GOWN DISPOSABLE) ×3 IMPLANT
GOWN STRL REIN XL XLG (GOWN DISPOSABLE) ×3 IMPLANT
KIT BASIN OR (CUSTOM PROCEDURE TRAY) ×3 IMPLANT
KIT MARKER MARGIN INK (KITS) ×3 IMPLANT
KIT ROOM TURNOVER OR (KITS) ×3 IMPLANT
NEEDLE HYPO 25X1 1.5 SAFETY (NEEDLE) ×3 IMPLANT
NS IRRIG 1000ML POUR BTL (IV SOLUTION) ×3 IMPLANT
PACK GENERAL/GYN (CUSTOM PROCEDURE TRAY) ×3 IMPLANT
PACK SURGICAL SETUP 50X90 (CUSTOM PROCEDURE TRAY) IMPLANT
PAD ARMBOARD 7.5X6 YLW CONV (MISCELLANEOUS) ×3 IMPLANT
PENCIL BUTTON HOLSTER BLD 10FT (ELECTRODE) ×1 IMPLANT
SPONGE LAP 18X18 X RAY DECT (DISPOSABLE) IMPLANT
SUT MON AB 5-0 PS2 18 (SUTURE) ×3 IMPLANT
SUT VIC AB 3-0 SH 18 (SUTURE) ×3 IMPLANT
SYR BULB 3OZ (MISCELLANEOUS) IMPLANT
SYR CONTROL 10ML LL (SYRINGE) ×3 IMPLANT
TOWEL OR 17X24 6PK STRL BLUE (TOWEL DISPOSABLE) ×3 IMPLANT
TOWEL OR 17X26 10 PK STRL BLUE (TOWEL DISPOSABLE) ×3 IMPLANT
TUBE CONNECTING 12'X1/4 (SUCTIONS)
TUBE CONNECTING 12X1/4 (SUCTIONS) IMPLANT
YANKAUER SUCT BULB TIP NO VENT (SUCTIONS) IMPLANT

## 2014-01-24 NOTE — Transfer of Care (Signed)
Immediate Anesthesia Transfer of Care Note  Patient: Tamara [name]  Procedure(s) Performed: Procedure(s): BREAST LUMPECTOMY WITH NEEDLE LOCALIZATION (Left)  Patient Location: PACU  Anesthesia Type:General  Level of Consciousness: awake, alert , oriented, patient cooperative and responds to stimulation  Airway & Oxygen Therapy: Patient Spontanous Breathing and Patient connected to nasal cannula oxygen  Post-op Assessment: Report given to PACU RN, Post -op Vital signs reviewed and stable and Patient moving all extremities X 4  Post vital signs: Reviewed and stable  Complications: No apparent anesthesia complications

## 2014-01-24 NOTE — Anesthesia Procedure Notes (Signed)
Procedure Name: LMA Insertion Date/Time: 01/24/2014 10:00 AM Performed by: Trixie Deis A Pre-anesthesia Checklist: Patient identified, Timeout performed, Emergency Drugs available, Suction available and Patient being monitored Patient Re-evaluated:Patient Re-evaluated prior to inductionOxygen Delivery Method: Circle system utilized Preoxygenation: Pre-oxygenation with 100% oxygen Intubation Type: IV induction LMA: LMA inserted LMA Size: 4.0 Number of attempts: 1 Placement Confirmation: positive ETCO2 and breath sounds checked- equal and bilateral Tube secured with: Tape Dental Injury: Teeth and Oropharynx as per pre-operative assessment

## 2014-01-24 NOTE — Op Note (Signed)
Preoperative Diagnosis: abnormal califications left breast  Postoprative Diagnosis: abnormal califications left breast  Procedure: Procedure(s): BREAST LUMPECTOMY WITH NEEDLE LOCALIZATION   Surgeon: Excell Seltzer T    Anesthesia:  General LMA anesthesia  Indications: patient is a 63 year old female who on a recent screening in diagnostic mammogram was found to have a new area of clustered calcifications behind the left nipple measuring about 2-1/2 cm in maximal diameter. Because of the patient's weight and body habitus stereotactic biopsy was unable to be performed.  Based on imaging findings we have recommended proceeding with needle localized excision of the area of calcifications for diagnosis. We discussed the indications and risks of the procedure extensively detailed elsewhere.  Procedure Detail:  Following accurate needle localization at the breast Center. The patient is brought to the operating room and placed in the supine position on the operating table and laryngeal mask general anesthesia induced. She received preoperative IV antibiotics and subcutaneous heparin. The left breast was widely sterilely prepped and draped. The patient time out was performed and correct procedure verified. A curvilinear incision was made near the wire insertion site. The areolar border and dissection carried down into the deep subcutaneous tissue. The wire was brought into the specimen. I then excised a generous portion of breast tissue surrounding the shaft and tip of the wire in order to excise all the calcifications. Specimen mammography was obtained after inking the specimen for margins and this revealed the calcifications to be contained within the specimen. This was sent for permanent pathology. The cavity was marked with clips. Soft tissue was infiltrated with Marcaine with epinephrine. With the breast the subcutaneous tissue was closed with interrupted 3-0 Vicryl and the skin with subcutaneous  particular 5-0 Monocryl. Sponge needle and instrument counts were correct.   Estimated Blood Loss:  Minimal         Drains: none  Blood Given: none          Specimens: left breast tissue inked for margins        Complications:  * No complications entered in OR log *         Disposition: PACU - hemodynamically stable.         Condition: stable

## 2014-01-24 NOTE — H&P (Signed)
Subjective:   abnormal mammogram left breast  Patient ID: Tamara [name], female DOB: [birth year], 63 y.o. MRN: [id number]  HPI  Patient is a 63 year old female referred by Dr. Curlene Dolphin due to a recent abnormal screening and diagnostic mammogram. The patient recently presented for screening mammogram and there were felt to be some new calcifications in the left breast. Diagnostic mammogram was performed which I have personally reviewed. This shows a cluster area of pleomorphic calcifications measuring 2.6 x 1.5 x 1.3 cm in the retroareolar area of the left breast. No associated mass. Due to the patient's weight this was unable to be biopsied stereotactically and open excisional biopsy was recommended. She has not had any breast symptoms, specifically lump or pain or nipple discharge or skin changes. She has no personal history of breast disease or family history of breast cancer.  Past Medical History   Diagnosis  Date   .  Depression    .  Hypertension    .  Obese    .  Cellulitis and abscess of leg    .  Arthritis    .  Asthma    .  Sleep apnea, obstructive    .  Shortness of breath    .  Wears glasses    .  Hearing loss     Past Surgical History   Procedure  Laterality  Date   .  Joint replacement       Knee   .  Tubal ligation     .  Inner ear surgery      Current Outpatient Prescriptions   Medication  Sig  Dispense  Refill   .  albuterol (PROVENTIL HFA;VENTOLIN HFA) 108 (90 BASE) MCG/ACT inhaler  Inhale 2 puffs into the lungs every 6 (six) hours as needed.  18 g  2   .  aspirin 81 MG tablet  Take 81 mg by mouth daily.     .  carvedilol (COREG) 25 MG tablet  Take 1 tablet (25 mg total) by mouth 2 (two) times daily with a meal.  180 tablet  3   .  divalproex (DEPAKOTE ER) 250 MG 24 hr tablet  TAKE ONE TABLET BY MOUTH EVERY DAY  100 tablet  3   .  DUAC gel      .  fish oil-omega-3 fatty acids 1000 MG capsule  Take 2 g by mouth daily.     .  fluconazole (DIFLUCAN) 150 MG tablet       .  fluticasone (CUTIVATE) 0.05 % cream      .  furosemide (LASIX) 20 MG tablet  Take 1 tablet (20 mg total) by mouth daily.  100 tablet  3   .  furosemide (LASIX) 40 MG tablet  Take 1 tablet (40 mg total) by mouth daily.  100 tablet  3   .  HYDROcodone-acetaminophen (NORCO) 5-325 MG per tablet  Take 1 tablet by mouth every 12 (twelve) hours.     .  Ibuprofen-Diphenhydramine Cit (ADVIL PM PO)  Take 2 tablets by mouth at bedtime as needed.     .  Ibuprofen-Diphenhydramine HCl (ADVIL PM) 200-25 MG CAPS  Take 2 capsules by mouth at bedtime.     Marland Kitchen  ketoconazole (NIZORAL) 2 % cream      .  ketoconazole (NIZORAL) 200 MG tablet  Take 200 mg by mouth daily.     Marland Kitchen  MICRO GUARD 2 % powder  APPLY TWO TIMES A DAY.  170 g  10   .  NON FORMULARY  Nizoral/cutivate 1:1  daily     .  PARoxetine (PAXIL) 20 MG tablet  Take 1 tablet (20 mg total) by mouth every morning.  100 tablet  3   .  potassium chloride SA (KLOR-CON M20) 20 MEQ tablet  TAKE ONE TABLET BY MOUTH EVERY DAY  100 tablet  3   .  pseudoephedrine-guaifenesin (MUCINEX D) 60-600 MG per tablet  Take 1 tablet by mouth every 12 (twelve) hours.     .  simvastatin (ZOCOR) 20 MG tablet  TAKE ONE TABLET BY MOUTH AT BEDTIME  100 tablet  3   .  traMADol (ULTRAM) 50 MG tablet  Take 1 tablet (50 mg total) by mouth every 8 (eight) hours as needed for pain.  90 tablet  1   .  VITAMIN D, CHOLECALCIFEROL, PO  Take by mouth.     .  vitamin E 100 UNIT capsule  Take 100 Units by mouth daily.     Marland Kitchen  losartan (COZAAR) 100 MG tablet  Take 1 tablet (100 mg total) by mouth daily.  100 tablet  3    No current facility-administered medications for this visit.   No Known Allergies  History   Substance Use Topics   .  Smoking status:  Never Smoker   .  Smokeless tobacco:  Not on file   .  Alcohol Use:  No   Review of Systems  Respiratory: Negative for cough and shortness of breath.  Sleep apnea, uses CPAP  Cardiovascular: Negative.  Gastrointestinal: Negative.   Musculoskeletal: Positive for arthralgias and myalgias.  Objective:   Physical Exam  There were no vitals taken for this visit.   Gen., morbidly obese female in no acute distress  Skin: No rash or infection  HEENT: No palpable masses or thyromegaly. Sclera nonicteric  Lungs: Clear equal breath sounds bilaterally without increased work of breathing  Lymph nodes: No cervical, subclavicular or axillary nodes palpable  Breasts: No palpable masses in either breast. No skin or nipple changes.  Cardiovascular: Regular rate and rhythm. No discernible edema  Abdomen: Obese soft and nontender. No masses  Neurologic: Alert and oriented. Gait normal.  Assessment:   Recent mammogram showing new area of suspicious calcifications, BI-RADS 4, retroareolar on the left breast. Unable to undergo stereotactic biopsy. I have recommended proceeding with needle localized excisional biopsy. We discussed the indication for the procedure and possible diagnoses. We discussed use of general anesthesia and risks of heart or lung problems under anesthesia, bleeding, infection, and likely need for further surgery should we obtain a diagnosis of breast cancer. She was given literature regarding the procedure. All her questions were answered.  Plan:   Needle localized left breast excisional biopsy for abnormal microcalcifications under general anesthesia as an outpatient.

## 2014-01-24 NOTE — Discharge Instructions (Signed)
[facility] °Office Phone Number [phone] ° °BREAST BIOPSY/ PARTIAL MASTECTOMY: POST OP INSTRUCTIONS ° °Always review your discharge instruction sheet given to you by the facility where your surgery was performed. ° °IF YOU HAVE DISABILITY OR FAMILY LEAVE FORMS, YOU MUST BRING THEM TO THE OFFICE FOR PROCESSING.  DO NOT GIVE THEM TO YOUR DOCTOR. ° °1. A prescription for pain medication may be given to you upon discharge.  Take your pain medication as prescribed, if needed.  If narcotic pain medicine is not needed, then you may take acetaminophen (Tylenol) or ibuprofen (Advil) as needed. °2. Take your usually prescribed medications unless otherwise directed °3. If you need a refill on your pain medication, please contact your pharmacy.  They will contact our office to request authorization.  Prescriptions will not be filled after 5pm or on week-ends. °4. You should eat very light the first 24 hours after surgery, such as soup, crackers, pudding, etc.  Resume your normal diet the day after surgery. °5. Most patients will experience some swelling and bruising in the breast.  Ice packs and a good support bra will help.  Swelling and bruising can take several days to resolve.  °6. It is common to experience some constipation if taking pain medication after surgery.  Increasing fluid intake and taking a stool softener will usually help or prevent this problem from occurring.  A mild laxative (Milk of Magnesia or Miralax) should be taken according to package directions if there are no bowel movements after 48 hours. °7. Unless discharge instructions indicate otherwise, you may remove your bandages 24-48 hours after surgery, and you may shower at that time.  You may have steri-strips (small skin tapes) in place directly over the incision.  These strips should be left on the skin for 7-10 days.  If your surgeon used skin glue on the incision, you may shower in 24 hours.  The glue will flake off over the  next 2-3 weeks.  Any sutures or staples will be removed at the office during your follow-up visit. °8. ACTIVITIES:  You may resume regular daily activities (gradually increasing) beginning the next day.  Wearing a good support bra or sports bra minimizes pain and swelling.  You may have sexual intercourse when it is comfortable. °a. You may drive when you no longer are taking prescription pain medication, you can comfortably wear a seatbelt, and you can safely maneuver your car and apply brakes. °b. RETURN TO WORK:  ______________________________________________________________________________________ °9. You should see your doctor in the office for a follow-up appointment approximately two weeks after your surgery.  Your doctor’s nurse will typically make your follow-up appointment when she calls you with your pathology report.  Expect your pathology report 2-3 business days after your surgery.  You may call to check if you do not hear from us after three days. °10. OTHER INSTRUCTIONS: _______________________________________________________________________________________________ _____________________________________________________________________________________________________________________________________ °_____________________________________________________________________________________________________________________________________ °_____________________________________________________________________________________________________________________________________ ° °WHEN TO CALL YOUR DOCTOR: °1. Fever over 101.0 °2. Nausea and/or vomiting. °3. Extreme swelling or bruising. °4. Continued bleeding from incision. °5. Increased pain, redness, or drainage from the incision. ° °The clinic staff is available to answer your questions during regular business hours.  Please don’t hesitate to call and ask to speak to one of the nurses for clinical concerns.  If you have a medical emergency, go to the nearest  emergency room or call 911.  A surgeon from [facility] is always on call at the hospital. ° °For further questions, please visit [email or web address]  ° ° °  What to eat: ° °For your first meals, you should eat lightly; only small meals initially.  If you do not have nausea, you may eat larger meals.  Avoid spicy, greasy and heavy food.   ° °General Anesthesia, Adult, Care After  °Refer to this sheet in the next few weeks. These instructions provide you with information on caring for yourself after your procedure. Your health care provider may also give you more specific instructions. Your treatment has been planned according to current medical practices, but problems sometimes occur. Call your health care provider if you have any problems or questions after your procedure.  °WHAT TO EXPECT AFTER THE PROCEDURE  °After the procedure, it is typical to experience:  °Sleepiness.  °Nausea and vomiting. °HOME CARE INSTRUCTIONS  °For the first 24 hours after general anesthesia:  °Have a responsible person with you.  °Do not drive a car. If you are alone, do not take public transportation.  °Do not drink alcohol.  °Do not take medicine that has not been prescribed by your health care provider.  °Do not sign important papers or make important decisions.  °You may resume a normal diet and activities as directed by your health care provider.  °Change bandages (dressings) as directed.  °If you have questions or problems that seem related to general anesthesia, call the hospital and ask for the anesthetist or anesthesiologist on call. °SEEK MEDICAL CARE IF:  °You have nausea and vomiting that continue the day after anesthesia.  °You develop a rash. °SEEK IMMEDIATE MEDICAL CARE IF:  °You have difficulty breathing.  °You have chest pain.  °You have any allergic problems. °Document Released: 02/23/2001 Document Revised: 07/20/2013 Document Reviewed: 06/02/2013  °[employer]® Patient Information ©2014 [employer].   ° ° °

## 2014-01-24 NOTE — Anesthesia Preprocedure Evaluation (Signed)
Anesthesia Evaluation  Patient identified by MRN, date of birth, ID band Patient awake    Reviewed: Allergy & Precautions, H&P , NPO status , Patient's Chart, lab work & pertinent test results  Airway       Dental   Pulmonary asthma , sleep apnea ,          Cardiovascular hypertension,     Neuro/Psych    GI/Hepatic   Endo/Other  Morbid obesity  Renal/GU      Musculoskeletal   Abdominal   Peds  Hematology   Anesthesia Other Findings   Reproductive/Obstetrics                           Anesthesia Physical Anesthesia Plan  ASA: III  Anesthesia Plan: General   Post-op Pain Management:    Induction: Intravenous  Airway Management Planned: Oral ETT and LMA  Additional Equipment:   Intra-op Plan:   Post-operative Plan: Extubation in OR  Informed Consent: I have reviewed the patients History and Physical, chart, labs and discussed the procedure including the risks, benefits and alternatives for the proposed anesthesia with the patient or authorized representative who has indicated his/her understanding and acceptance.     Plan Discussed with:   Anesthesia Plan Comments:         Anesthesia Quick Evaluation

## 2014-01-24 NOTE — Anesthesia Postprocedure Evaluation (Signed)
  Anesthesia Post-op Note  Patient: Tamara [name]  Procedure(s) Performed: Procedure(s): BREAST LUMPECTOMY WITH NEEDLE LOCALIZATION (Left)  Patient Location: PACU  Anesthesia Type:General  Level of Consciousness: awake, alert , oriented and patient cooperative  Airway and Oxygen Therapy: Patient Spontanous Breathing  Post-op Pain: mild  Post-op Assessment: Post-op Vital signs reviewed, Patient's Cardiovascular Status Stable, Respiratory Function Stable, Patent Airway, No signs of Nausea or vomiting and Pain level controlled  Post-op Vital Signs: stable  Complications: No apparent anesthesia complications

## 2014-01-24 NOTE — Preoperative (Signed)
Beta Blockers   Reason not to administer Beta Blockers:Not Applicable 

## 2014-01-25 ENCOUNTER — Encounter (HOSPITAL_COMMUNITY): Payer: Self-pay | Admitting: General Surgery

## 2014-01-27 ENCOUNTER — Telehealth (INDEPENDENT_AMBULATORY_CARE_PROVIDER_SITE_OTHER): Payer: Self-pay

## 2014-01-27 ENCOUNTER — Telehealth (INDEPENDENT_AMBULATORY_CARE_PROVIDER_SITE_OTHER): Payer: Self-pay | Admitting: General Surgery

## 2014-01-27 NOTE — Telephone Encounter (Signed)
Pt called requesting path result. I advised pt that msg will be sent to Dr Excell Seltzer to review and release path. Pt can be reached at [phone].

## 2014-01-27 NOTE — Telephone Encounter (Signed)
Called the patient and discussed the pathology report. This has revealed ductal carcinoma in situ. Free margins are extremely close, less than 1 mm. She did not have a diagnosis prior to this excisional biopsy. I discussed with her that she will need at least reexcision of margins. We are going to contact radiation oncology for an appointment to make sure that she is a candidate for radiation as she could not have a core biopsy due to her size. I discussed with her the only other standard option within established guidelines would be a mastectomy. We will tentatively work on scheduling her for reexcision and I will need to see her back in the office prior to this and review the radiation oncology recommendation.

## 2014-01-31 ENCOUNTER — Other Ambulatory Visit (INDEPENDENT_AMBULATORY_CARE_PROVIDER_SITE_OTHER): Payer: Self-pay

## 2014-01-31 ENCOUNTER — Encounter (HOSPITAL_COMMUNITY): Payer: Self-pay | Admitting: Pharmacy Technician

## 2014-01-31 DIAGNOSIS — D0510 Intraductal carcinoma in situ of unspecified breast: Secondary | ICD-10-CM

## 2014-02-01 ENCOUNTER — Ambulatory Visit: Payer: BC Managed Care – PPO | Admitting: Radiation Oncology

## 2014-02-01 ENCOUNTER — Encounter (HOSPITAL_COMMUNITY): Payer: Self-pay | Admitting: *Deleted

## 2014-02-01 ENCOUNTER — Ambulatory Visit: Payer: BC Managed Care – PPO

## 2014-02-01 ENCOUNTER — Other Ambulatory Visit (INDEPENDENT_AMBULATORY_CARE_PROVIDER_SITE_OTHER): Payer: Self-pay | Admitting: General Surgery

## 2014-02-02 ENCOUNTER — Encounter (HOSPITAL_COMMUNITY)
Admission: RE | Disposition: A | Discharge status: Home / Self Care | Payer: Self-pay | Source: Ambulatory Visit | Attending: General Surgery

## 2014-02-02 ENCOUNTER — Ambulatory Visit (HOSPITAL_COMMUNITY)
Admission: RE | Admit: 2014-02-02 | Discharge: 2014-02-02 | Disposition: A | Discharge status: Home / Self Care | Payer: BC Managed Care – PPO | Source: Ambulatory Visit | Attending: General Surgery | Admitting: General Surgery

## 2014-02-02 ENCOUNTER — Encounter (HOSPITAL_COMMUNITY): Payer: BC Managed Care – PPO | Admitting: Certified Registered Nurse Anesthetist

## 2014-02-02 ENCOUNTER — Ambulatory Visit (HOSPITAL_COMMUNITY): Payer: BC Managed Care – PPO | Admitting: Certified Registered Nurse Anesthetist

## 2014-02-02 ENCOUNTER — Encounter (HOSPITAL_COMMUNITY): Payer: Self-pay | Admitting: Certified Registered Nurse Anesthetist

## 2014-02-02 DIAGNOSIS — G4733 Obstructive sleep apnea (adult) (pediatric): Secondary | ICD-10-CM | POA: Insufficient documentation

## 2014-02-02 DIAGNOSIS — D0590 Unspecified type of carcinoma in situ of unspecified breast: Secondary | ICD-10-CM | POA: Insufficient documentation

## 2014-02-02 DIAGNOSIS — E669 Obesity, unspecified: Secondary | ICD-10-CM | POA: Insufficient documentation

## 2014-02-02 DIAGNOSIS — F3289 Other specified depressive episodes: Secondary | ICD-10-CM | POA: Insufficient documentation

## 2014-02-02 DIAGNOSIS — I10 Essential (primary) hypertension: Secondary | ICD-10-CM | POA: Insufficient documentation

## 2014-02-02 DIAGNOSIS — J45909 Unspecified asthma, uncomplicated: Secondary | ICD-10-CM | POA: Insufficient documentation

## 2014-02-02 DIAGNOSIS — Z79899 Other long term (current) drug therapy: Secondary | ICD-10-CM | POA: Insufficient documentation

## 2014-02-02 DIAGNOSIS — D0510 Intraductal carcinoma in situ of unspecified breast: Secondary | ICD-10-CM

## 2014-02-02 DIAGNOSIS — Z96659 Presence of unspecified artificial knee joint: Secondary | ICD-10-CM | POA: Insufficient documentation

## 2014-02-02 DIAGNOSIS — F329 Major depressive disorder, single episode, unspecified: Secondary | ICD-10-CM | POA: Insufficient documentation

## 2014-02-02 DIAGNOSIS — M129 Arthropathy, unspecified: Secondary | ICD-10-CM | POA: Insufficient documentation

## 2014-02-02 HISTORY — DX: Restless legs syndrome: G25.81

## 2014-02-02 HISTORY — PX: RE-EXCISION OF BREAST LUMPECTOMY: SHX6048

## 2014-02-02 LAB — BASIC METABOLIC PANEL
BUN: 23 mg/dL (ref 6–23)
CO2: 22 meq/L (ref 19–32)
Calcium: 9.9 mg/dL (ref 8.4–10.5)
Chloride: 99 mEq/L (ref 96–112)
Creatinine, Ser: 0.77 mg/dL (ref 0.50–1.10)
GFR calc Af Amer: >90 mL/min (ref >90)
GFR calc non Af Amer: 88 mL/min — ABNORMAL LOW (ref >90)
GLUCOSE: 96 mg/dL (ref 70–99)
POTASSIUM: 4.4 meq/L (ref 3.7–5.3)
Sodium: 139 mEq/L (ref 137–147)

## 2014-02-02 LAB — CBC
HCT: 41.3 % (ref 36.0–46.0)
HEMOGLOBIN: 14.1 g/dL (ref 12.0–15.0)
MCH: 29.1 pg (ref 26.0–34.0)
MCHC: 34.1 g/dL (ref 30.0–36.0)
MCV: 85.2 fL (ref 78.0–100.0)
Platelets: 174 10*3/uL (ref 150–400)
RBC: 4.85 MIL/uL (ref 3.87–5.11)
RDW: 13.3 % (ref 11.5–15.5)
WBC: 5.8 10*3/uL (ref 4.0–10.5)

## 2014-02-02 SURGERY — EXCISION, LESION, BREAST
Anesthesia: General | Site: Breast | Laterality: Left

## 2014-02-02 MED ORDER — LACTATED RINGERS IV SOLN
INTRAVENOUS | Status: DC | PRN
  Administered 2014-02-02 (×2): via INTRAVENOUS

## 2014-02-02 MED ORDER — PROPOFOL 10 MG/ML IV BOLUS
INTRAVENOUS | Status: AC
  Filled 2014-02-02: qty 20

## 2014-02-02 MED ORDER — ONDANSETRON HCL 4 MG/2ML IJ SOLN: 4.0000 mg | Freq: Once | INTRAMUSCULAR | Status: DC | PRN

## 2014-02-02 MED ORDER — PROPOFOL 10 MG/ML IV BOLUS
INTRAVENOUS | Status: DC | PRN
  Administered 2014-02-02: 110 mg via INTRAVENOUS

## 2014-02-02 MED ORDER — GLYCOPYRROLATE 0.2 MG/ML IJ SOLN
INTRAMUSCULAR | Status: DC | PRN
  Administered 2014-02-02 (×2): 0.2 mg via INTRAVENOUS

## 2014-02-02 MED ORDER — FENTANYL CITRATE 0.05 MG/ML IJ SOLN
INTRAMUSCULAR | Status: AC
  Filled 2014-02-02: qty 5

## 2014-02-02 MED ORDER — OXYCODONE HCL 5 MG/5ML PO SOLN: 5.0000 mg | Freq: Once | ORAL | Status: DC | PRN

## 2014-02-02 MED ORDER — STERILE WATER FOR INJECTION IJ SOLN
INTRAMUSCULAR | Status: AC
  Filled 2014-02-02: qty 10

## 2014-02-02 MED ORDER — ONDANSETRON HCL 4 MG/2ML IJ SOLN
INTRAMUSCULAR | Status: AC
  Filled 2014-02-02: qty 2

## 2014-02-02 MED ORDER — BUPIVACAINE-EPINEPHRINE 0.25% -1:200000 IJ SOLN
INTRAMUSCULAR | Status: DC | PRN
  Administered 2014-02-02: 30 mL

## 2014-02-02 MED ORDER — ONDANSETRON HCL 4 MG/2ML IJ SOLN
INTRAMUSCULAR | Status: DC | PRN
  Administered 2014-02-02: 4 mg via INTRAVENOUS

## 2014-02-02 MED ORDER — FENTANYL CITRATE 0.05 MG/ML IJ SOLN
INTRAMUSCULAR | Status: DC | PRN
  Administered 2014-02-02: 150 ug via INTRAVENOUS
  Administered 2014-02-02: 25 ug via INTRAVENOUS

## 2014-02-02 MED ORDER — EPHEDRINE SULFATE 50 MG/ML IJ SOLN
INTRAMUSCULAR | Status: AC
  Filled 2014-02-02: qty 1

## 2014-02-02 MED ORDER — GLYCOPYRROLATE 0.2 MG/ML IJ SOLN
INTRAMUSCULAR | Status: AC
  Filled 2014-02-02: qty 1

## 2014-02-02 MED ORDER — LIDOCAINE HCL (PF) 1 % IJ SOLN
INTRAMUSCULAR | Status: DC | PRN
  Administered 2014-02-02: 30 mL

## 2014-02-02 MED ORDER — LACTATED RINGERS IV SOLN
INTRAVENOUS | Status: DC
  Administered 2014-02-02: 08:00:00 via INTRAVENOUS

## 2014-02-02 MED ORDER — SODIUM BICARBONATE 4 % IV SOLN
INTRAVENOUS | Status: DC | PRN
  Administered 2014-02-02: 5 mL via INTRAVENOUS

## 2014-02-02 MED ORDER — MIDAZOLAM HCL 2 MG/2ML IJ SOLN
INTRAMUSCULAR | Status: AC
  Filled 2014-02-02: qty 2

## 2014-02-02 MED ORDER — DEXTROSE 5 % IV SOLN
3.0000 g | INTRAVENOUS | Status: AC
  Administered 2014-02-02: 3 g via INTRAVENOUS
  Filled 2014-02-02 (×2): qty 3000

## 2014-02-02 MED ORDER — OXYCODONE HCL 5 MG PO TABS: 5.0000 mg | ORAL_TABLET | Freq: Once | ORAL | Status: DC | PRN

## 2014-02-02 MED ORDER — SODIUM BICARBONATE 4 % IV SOLN
INTRAVENOUS | Status: AC
  Filled 2014-02-02: qty 5

## 2014-02-02 MED ORDER — MIDAZOLAM HCL 5 MG/5ML IJ SOLN
INTRAMUSCULAR | Status: DC | PRN
  Administered 2014-02-02: 2 mg via INTRAVENOUS

## 2014-02-02 MED ORDER — LIDOCAINE HCL (PF) 1 % IJ SOLN
INTRAMUSCULAR | Status: AC
  Filled 2014-02-02: qty 30

## 2014-02-02 MED ORDER — MEPERIDINE HCL 25 MG/ML IJ SOLN: 6.2500 mg | INTRAMUSCULAR | Status: DC | PRN

## 2014-02-02 MED ORDER — LIDOCAINE HCL (CARDIAC) 20 MG/ML IV SOLN
INTRAVENOUS | Status: AC
  Filled 2014-02-02: qty 5

## 2014-02-02 MED ORDER — HYDROMORPHONE HCL PF 1 MG/ML IJ SOLN: 0.2500 mg | INTRAMUSCULAR | Status: DC | PRN

## 2014-02-02 MED ORDER — EPHEDRINE SULFATE 50 MG/ML IJ SOLN
INTRAMUSCULAR | Status: DC | PRN
  Administered 2014-02-02 (×3): 5 mg via INTRAVENOUS
  Administered 2014-02-02: 10 mg via INTRAVENOUS
  Administered 2014-02-02: 5 mg via INTRAVENOUS
  Administered 2014-02-02: 10 mg via INTRAVENOUS

## 2014-02-02 MED ORDER — OXYCODONE-ACETAMINOPHEN 5-325 MG PO TABS: 1.0000 | ORAL_TABLET | Freq: Four times a day (QID) | ORAL | Status: DC | PRN

## 2014-02-02 MED ORDER — BUPIVACAINE-EPINEPHRINE (PF) 0.25% -1:200000 IJ SOLN
INTRAMUSCULAR | Status: AC
  Filled 2014-02-02: qty 30

## 2014-02-02 MED ORDER — LIDOCAINE HCL (CARDIAC) 20 MG/ML IV SOLN
INTRAVENOUS | Status: DC | PRN
  Administered 2014-02-02: 100 mg via INTRAVENOUS

## 2014-02-02 SURGICAL SUPPLY — 63 items
ADH SKN CLS APL DERMABOND .7 (GAUZE/BANDAGES/DRESSINGS) ×1
APL SKNCLS STERI-STRIP NONHPOA (GAUZE/BANDAGES/DRESSINGS)
APPLIER CLIP 9.375 MED OPEN (MISCELLANEOUS) ×2
APR CLP MED 9.3 20 MLT OPN (MISCELLANEOUS) ×1
BENZOIN TINCTURE PRP APPL 2/3 (GAUZE/BANDAGES/DRESSINGS) IMPLANT
BINDER BREAST LRG (GAUZE/BANDAGES/DRESSINGS) IMPLANT
BINDER BREAST XLRG (GAUZE/BANDAGES/DRESSINGS) IMPLANT
BINDER BREAST XXLRG (GAUZE/BANDAGES/DRESSINGS) ×2 IMPLANT
BLADE SURG 10 STRL SS (BLADE) ×2 IMPLANT
BLADE SURG 15 STRL LF DISP TIS (BLADE) ×1 IMPLANT
BLADE SURG 15 STRL SS (BLADE) ×2
CANISTER SUCTION 2500CC (MISCELLANEOUS) ×2 IMPLANT
CHLORAPREP W/TINT 26ML (MISCELLANEOUS) ×2 IMPLANT
CLIP APPLIE 9.375 MED OPEN (MISCELLANEOUS) ×1 IMPLANT
CONT SPEC 4OZ CLIKSEAL STRL BL (MISCELLANEOUS) ×6 IMPLANT
COVER SURGICAL LIGHT HANDLE (MISCELLANEOUS) ×2 IMPLANT
DECANTER SPIKE VIAL GLASS SM (MISCELLANEOUS) IMPLANT
DERMABOND ADVANCED (GAUZE/BANDAGES/DRESSINGS) ×1
DERMABOND ADVANCED .7 DNX12 (GAUZE/BANDAGES/DRESSINGS) ×1 IMPLANT
DEVICE DUBIN SPECIMEN MAMMOGRA (MISCELLANEOUS) IMPLANT
DRAPE CHEST BREAST 15X10 FENES (DRAPES) ×2 IMPLANT
DRAPE PED LAPAROTOMY (DRAPES) ×1 IMPLANT
DRAPE UTILITY 15X26 W/TAPE STR (DRAPE) ×4 IMPLANT
ELECT COATED BLADE 2.86 ST (ELECTRODE) ×2 IMPLANT
ELECT REM PT RETURN 9FT ADLT (ELECTROSURGICAL) ×2
ELECTRODE REM PT RTRN 9FT ADLT (ELECTROSURGICAL) ×1 IMPLANT
GAUZE SPONGE 4X4 16PLY XRAY LF (GAUZE/BANDAGES/DRESSINGS) ×2 IMPLANT
GLOVE BIO SURGEON STRL SZ 6.5 (GLOVE) ×2 IMPLANT
GLOVE BIO SURGEON STRL SZ7.5 (GLOVE) ×2 IMPLANT
GLOVE BIOGEL PI IND STRL 7.5 (GLOVE) ×2 IMPLANT
GLOVE BIOGEL PI IND STRL 8 (GLOVE) ×1 IMPLANT
GLOVE BIOGEL PI INDICATOR 7.5 (GLOVE) ×2
GLOVE BIOGEL PI INDICATOR 8 (GLOVE) ×1
GLOVE ECLIPSE 6.5 STRL STRAW (GLOVE) ×2 IMPLANT
GLOVE SS BIOGEL STRL SZ 7.5 (GLOVE) ×2 IMPLANT
GLOVE SUPERSENSE BIOGEL SZ 7.5 (GLOVE) ×2
GOWN STRL REUS W/ TWL LRG LVL3 (GOWN DISPOSABLE) ×1 IMPLANT
GOWN STRL REUS W/ TWL XL LVL3 (GOWN DISPOSABLE) ×1 IMPLANT
GOWN STRL REUS W/TWL 2XL LVL3 (GOWN DISPOSABLE) ×2 IMPLANT
GOWN STRL REUS W/TWL LRG LVL3 (GOWN DISPOSABLE) ×2
GOWN STRL REUS W/TWL XL LVL3 (GOWN DISPOSABLE) ×2
KIT BASIN OR (CUSTOM PROCEDURE TRAY) ×2 IMPLANT
KIT MARKER MARGIN INK (KITS) IMPLANT
KIT ROOM TURNOVER OR (KITS) ×2 IMPLANT
NEEDLE HYPO 25GX1X1/2 BEV (NEEDLE) ×2 IMPLANT
NS IRRIG 1000ML POUR BTL (IV SOLUTION) ×2 IMPLANT
PACK SURGICAL SETUP 50X90 (CUSTOM PROCEDURE TRAY) ×2 IMPLANT
PAD ARMBOARD 7.5X6 YLW CONV (MISCELLANEOUS) ×2 IMPLANT
PENCIL BUTTON HOLSTER BLD 10FT (ELECTRODE) ×2 IMPLANT
SPONGE GAUZE 4X4 12PLY (GAUZE/BANDAGES/DRESSINGS) IMPLANT
STRIP CLOSURE SKIN 1/2X4 (GAUZE/BANDAGES/DRESSINGS) IMPLANT
SUT MON AB 4-0 PC3 18 (SUTURE) ×2 IMPLANT
SUT SILK 2 0 SH (SUTURE) IMPLANT
SUT VIC AB 3-0 SH 18 (SUTURE) ×2 IMPLANT
SUT VIC AB 3-0 SH 27 (SUTURE) ×2
SUT VIC AB 3-0 SH 27XBRD (SUTURE) ×1 IMPLANT
SYR BULB 3OZ (MISCELLANEOUS) ×2 IMPLANT
SYR CONTROL 10ML LL (SYRINGE) ×2 IMPLANT
TOWEL OR 17X24 6PK STRL BLUE (TOWEL DISPOSABLE) IMPLANT
TOWEL OR 17X26 10 PK STRL BLUE (TOWEL DISPOSABLE) ×2 IMPLANT
TUBE CONNECTING 12X1/4 (SUCTIONS) ×2 IMPLANT
WATER STERILE IRR 1000ML POUR (IV SOLUTION) IMPLANT
YANKAUER SUCT BULB TIP NO VENT (SUCTIONS) ×2 IMPLANT

## 2014-02-02 NOTE — Transfer of Care (Signed)
Immediate Anesthesia Transfer of Care Note  Patient: Tamara [name]  Procedure(s) Performed: Procedure(s): RE-EXCISION OF LEFT BREAST LUMPECTOMY (Left)  Patient Location: PACU  Anesthesia Type:General  Level of Consciousness: awake and alert   Airway & Oxygen Therapy: Patient Spontanous Breathing and Patient connected to nasal cannula oxygen  Post-op Assessment: Report given to PACU RN and Post -op Vital signs reviewed and stable  Post vital signs: Reviewed and stable  Complications: No apparent anesthesia complications

## 2014-02-02 NOTE — Anesthesia Procedure Notes (Signed)
Procedure Name: LMA Insertion Date/Time: 02/02/2014 10:23 AM Performed by: Blair Heys E Pre-anesthesia Checklist: Patient identified, Emergency Drugs available, Suction available and Patient being monitored Patient Re-evaluated:Patient Re-evaluated prior to inductionOxygen Delivery Method: Circle system utilized Preoxygenation: Pre-oxygenation with 100% oxygen Intubation Type: IV induction Ventilation: Mask ventilation without difficulty LMA: LMA inserted LMA Size: 4.0 Number of attempts: 1 Placement Confirmation: positive ETCO2 and breath sounds checked- equal and bilateral Tube secured with: Tape Dental Injury: Teeth and Oropharynx as per pre-operative assessment

## 2014-02-02 NOTE — Anesthesia Preprocedure Evaluation (Addendum)
Anesthesia Evaluation  Patient identified by MRN, date of birth, ID band Patient awake    Reviewed: Allergy & Precautions, H&P , NPO status , Patient's Chart, lab work & pertinent test results, reviewed documented beta blocker date and time   Airway Mallampati: II TM Distance: >3 FB Neck ROM: Full    Dental  (+) Dental Advisory Given, Teeth Intact   Pulmonary asthma , sleep apnea and Continuous Positive Airway Pressure Ventilation ,          Cardiovascular hypertension, Pt. on home beta blockers and Pt. on medications     Neuro/Psych PSYCHIATRIC DISORDERS Depression    GI/Hepatic   Endo/Other  Morbid obesity  Renal/GU      Musculoskeletal  (+) Arthritis -,   Abdominal   Peds  Hematology   Anesthesia Other Findings   Reproductive/Obstetrics                         Anesthesia Physical Anesthesia Plan  ASA: III  Anesthesia Plan: General   Post-op Pain Management:    Induction: Intravenous  Airway Management Planned: LMA  Additional Equipment:   Intra-op Plan:   Post-operative Plan: Extubation in OR  Informed Consent: I have reviewed the patients History and Physical, chart, labs and discussed the procedure including the risks, benefits and alternatives for the proposed anesthesia with the patient or authorized representative who has indicated his/her understanding and acceptance.   Dental advisory given  Plan Discussed with: CRNA and Surgeon  Anesthesia Plan Comments:        Anesthesia Quick Evaluation

## 2014-02-02 NOTE — Discharge Instructions (Signed)
[facility] °Office Phone Number [phone] ° °BREAST BIOPSY/ PARTIAL MASTECTOMY: POST OP INSTRUCTIONS ° °Always review your discharge instruction sheet given to you by the facility where your surgery was performed. ° °IF YOU HAVE DISABILITY OR FAMILY LEAVE FORMS, YOU MUST BRING THEM TO THE OFFICE FOR PROCESSING.  DO NOT GIVE THEM TO YOUR DOCTOR. ° °1. A prescription for pain medication may be given to you upon discharge.  Take your pain medication as prescribed, if needed.  If narcotic pain medicine is not needed, then you may take acetaminophen (Tylenol) or ibuprofen (Advil) as needed. °2. Take your usually prescribed medications unless otherwise directed °3. If you need a refill on your pain medication, please contact your pharmacy.  They will contact our office to request authorization.  Prescriptions will not be filled after 5pm or on week-ends. °4. You should eat very light the first 24 hours after surgery, such as soup, crackers, pudding, etc.  Resume your normal diet the day after surgery. °5. Most patients will experience some swelling and bruising in the breast.  Ice packs and a good support bra will help.  Swelling and bruising can take several days to resolve.  °6. It is common to experience some constipation if taking pain medication after surgery.  Increasing fluid intake and taking a stool softener will usually help or prevent this problem from occurring.  A mild laxative (Milk of Magnesia or Miralax) should be taken according to package directions if there are no bowel movements after 48 hours. °7. Unless discharge instructions indicate otherwise, you may remove your bandages 24-48 hours after surgery, and you may shower at that time.  You may have steri-strips (small skin tapes) in place directly over the incision.  These strips should be left on the skin for 7-10 days.  If your surgeon used skin glue on the incision, you may shower in 24 hours.  The glue will flake off over the  next 2-3 weeks.  Any sutures or staples will be removed at the office during your follow-up visit. °8. ACTIVITIES:  You may resume regular daily activities (gradually increasing) beginning the next day.  Wearing a good support bra or sports bra minimizes pain and swelling.  You may have sexual intercourse when it is comfortable. °a. You may drive when you no longer are taking prescription pain medication, you can comfortably wear a seatbelt, and you can safely maneuver your car and apply brakes. °b. RETURN TO WORK:  ______________________________________________________________________________________ °9. You should see your doctor in the office for a follow-up appointment approximately two weeks after your surgery.  Your doctor’s nurse will typically make your follow-up appointment when she calls you with your pathology report.  Expect your pathology report 2-3 business days after your surgery.  You may call to check if you do not hear from us after three days. °10. OTHER INSTRUCTIONS: _______________________________________________________________________________________________ _____________________________________________________________________________________________________________________________________ °_____________________________________________________________________________________________________________________________________ °_____________________________________________________________________________________________________________________________________ ° °WHEN TO CALL YOUR DOCTOR: °1. Fever over 101.0 °2. Nausea and/or vomiting. °3. Extreme swelling or bruising. °4. Continued bleeding from incision. °5. Increased pain, redness, or drainage from the incision. ° °The clinic staff is available to answer your questions during regular business hours.  Please don’t hesitate to call and ask to speak to one of the nurses for clinical concerns.  If you have a medical emergency, go to the nearest  emergency room or call 911.  A surgeon from [facility] is always on call at the hospital. ° °For further questions, please visit [email or web address]  ° ° °  What to eat: ° °For your first meals, you should eat lightly; only small meals initially.  If you do not have nausea, you may eat larger meals.  Avoid spicy, greasy and heavy food.   ° °General Anesthesia, Adult, Care After  °Refer to this sheet in the next few weeks. These instructions provide you with information on caring for yourself after your procedure. Your health care provider may also give you more specific instructions. Your treatment has been planned according to current medical practices, but problems sometimes occur. Call your health care provider if you have any problems or questions after your procedure.  °WHAT TO EXPECT AFTER THE PROCEDURE  °After the procedure, it is typical to experience:  °Sleepiness.  °Nausea and vomiting. °HOME CARE INSTRUCTIONS  °For the first 24 hours after general anesthesia:  °Have a responsible person with you.  °Do not drive a car. If you are alone, do not take public transportation.  °Do not drink alcohol.  °Do not take medicine that has not been prescribed by your health care provider.  °Do not sign important papers or make important decisions.  °You may resume a normal diet and activities as directed by your health care provider.  °Change bandages (dressings) as directed.  °If you have questions or problems that seem related to general anesthesia, call the hospital and ask for the anesthetist or anesthesiologist on call. °SEEK MEDICAL CARE IF:  °You have nausea and vomiting that continue the day after anesthesia.  °You develop a rash. °SEEK IMMEDIATE MEDICAL CARE IF:  °You have difficulty breathing.  °You have chest pain.  °You have any allergic problems. °Document Released: 02/23/2001 Document Revised: 07/20/2013 Document Reviewed: 06/02/2013  °[employer]® Patient Information ©2014 [employer].   ° ° °

## 2014-02-02 NOTE — H&P (Signed)
HPI  Patient is a 63 year old female referred by Dr. Curlene Dolphin due to a recent abnormal screening and diagnostic mammogram. The patient recently presented for screening mammogram and there were felt to be some new calcifications in the left breast. Diagnostic mammogram was performed which I have personally reviewed. This shows a cluster area of pleomorphic calcifications measuring 2.6 x 1.5 x 1.3 cm in the retroareolar area of the left breast. No associated mass. Due to the patient's weight this was unable to be biopsied stereotactically and open excisional biopsy was recommended. This was performed last week which revealed ductal carcinoma in situ. She had 3 very close margins at inside of 1 mm. I have recommended reexcision of her lumpectomy to obtain adequate margins. She is brought in today for this procedure.  Past Medical History   Diagnosis  Date   .  Depression    .  Hypertension    .  Obese    .  Cellulitis and abscess of leg    .  Arthritis    .  Asthma    .  Sleep apnea, obstructive    .  Shortness of breath    .  Wears glasses    .  Hearing loss     Past Surgical History   Procedure  Laterality  Date   .  Joint replacement       Knee   .  Tubal ligation     .  Inner ear surgery      Current Outpatient Prescriptions   Medication  Sig  Dispense  Refill   .  albuterol (PROVENTIL HFA;VENTOLIN HFA) 108 (90 BASE) MCG/ACT inhaler  Inhale 2 puffs into the lungs every 6 (six) hours as needed.  18 g  2   .  aspirin 81 MG tablet  Take 81 mg by mouth daily.     .  carvedilol (COREG) 25 MG tablet  Take 1 tablet (25 mg total) by mouth 2 (two) times daily with a meal.  180 tablet  3   .  divalproex (DEPAKOTE ER) 250 MG 24 hr tablet  TAKE ONE TABLET BY MOUTH EVERY DAY  100 tablet  3   .  DUAC gel      .  fish oil-omega-3 fatty acids 1000 MG capsule  Take 2 g by mouth daily.     .  fluconazole (DIFLUCAN) 150 MG tablet      .  fluticasone (CUTIVATE) 0.05 % cream      .  furosemide  (LASIX) 20 MG tablet  Take 1 tablet (20 mg total) by mouth daily.  100 tablet  3   .  furosemide (LASIX) 40 MG tablet  Take 1 tablet (40 mg total) by mouth daily.  100 tablet  3   .  HYDROcodone-acetaminophen (NORCO) 5-325 MG per tablet  Take 1 tablet by mouth every 12 (twelve) hours.     .  Ibuprofen-Diphenhydramine Cit (ADVIL PM PO)  Take 2 tablets by mouth at bedtime as needed.     .  Ibuprofen-Diphenhydramine HCl (ADVIL PM) 200-25 MG CAPS  Take 2 capsules by mouth at bedtime.     Marland Kitchen  ketoconazole (NIZORAL) 2 % cream      .  ketoconazole (NIZORAL) 200 MG tablet  Take 200 mg by mouth daily.     Marland Kitchen  MICRO GUARD 2 % powder  APPLY TWO TIMES A DAY.  170 g  10   .  NON FORMULARY  Nizoral/cutivate 1:1  daily     .  PARoxetine (PAXIL) 20 MG tablet  Take 1 tablet (20 mg total) by mouth every morning.  100 tablet  3   .  potassium chloride SA (KLOR-CON M20) 20 MEQ tablet  TAKE ONE TABLET BY MOUTH EVERY DAY  100 tablet  3   .  pseudoephedrine-guaifenesin (MUCINEX D) 60-600 MG per tablet  Take 1 tablet by mouth every 12 (twelve) hours.     .  simvastatin (ZOCOR) 20 MG tablet  TAKE ONE TABLET BY MOUTH AT BEDTIME  100 tablet  3   .  traMADol (ULTRAM) 50 MG tablet  Take 1 tablet (50 mg total) by mouth every 8 (eight) hours as needed for pain.  90 tablet  1   .  VITAMIN D, CHOLECALCIFEROL, PO  Take by mouth.     .  vitamin E 100 UNIT capsule  Take 100 Units by mouth daily.     Marland Kitchen  losartan (COZAAR) 100 MG tablet  Take 1 tablet (100 mg total) by mouth daily.  100 tablet  3    No current facility-administered medications for this visit.   No Known Allergies  History   Substance Use Topics   .  Smoking status:  Never Smoker   .  Smokeless tobacco:  Not on file   .  Alcohol Use:  No   Review of Systems  Respiratory: Negative for cough and shortness of breath.  Sleep apnea, uses CPAP  Cardiovascular: Negative.  Gastrointestinal: Negative.  Musculoskeletal: Positive for arthralgias and myalgias.   Objective:   Physical Exam  BP 162/101  Pulse 90  Temp(Src) 98.2 F (36.8 C) (Temporal)  Resp 20  Ht 5\' 5"  (1.651 m)  Wt 302 lb 6.4 oz (137.168 kg)  BMI 50.32 kg/m2  Gen., morbidly obese female in no acute distress  Skin: No rash or infection  HEENT: No palpable masses or thyromegaly. Sclera nonicteric  Lungs: Clear equal breath sounds bilaterally without increased work of breathing  Lymph nodes: No cervical, subclavicular or axillary nodes palpable  Breasts: Nicely healing incision circumareolar left breast without evidence of infection. No palpable masses. Cardiovascular: Regular rate and rhythm. No discernible edema  Abdomen: Obese soft and nontender. No masses  Neurologic: Alert and oriented. Gait normal.  Assessment:   Recent excisional biopsy left breast showing ductal carcinoma in situ. She has 3 close margins. I recommended reexcision to obtain adequate margins.  Plan:   reexcision left breast lumpectomy under general anesthesia as an outpatient.

## 2014-02-02 NOTE — Preoperative (Signed)
Beta Blockers   Reason not to administer Beta Blockers:Not Applicable, patient took Carvedilol 02/02/14.

## 2014-02-02 NOTE — Op Note (Signed)
Preoperative Diagnosis: DCIS left breast  Postoprative Diagnosis: DCIS left breast  Procedure: Procedure(s): RE-EXCISION OF LEFT BREAST LUMPECTOMY   Surgeon: Excell Seltzer T   Assistants: None  Anesthesia:  General LMA anesthesia  Indications: patient is a 63 year old female status post recent needle localized excisional breast biopsy for clustered calcifications in the upper-outer left breast. Large needle biopsy could not be performed due to body habitus. Pathology has revealed ductal carcinoma in situ. Margins were microscopically negative but the anterior, superior and inferior margins are less than 1 mm. I have recommended reexcision of all these margins. We discussed the indications and risks of anesthetic complications, bleeding, infection and possible need for further surgery based on final pathology and she understands and agrees.  Procedure Detail: patient was brought to the operating room, placed in the supine position on the operating table, and laryngeal mask general anesthesia induced. She received preoperative IV antibiotics. The left breast was widely sterilely prepped and draped. Patient time out was performed and correct procedure verified. The previous incision was sharply opened and dissection carried down through the subcutaneous tissue and sutures removed. The lumpectomy cavity was exposed all previous suture material removed and the cavity widely opened and exposed. I then excised the inferior and superior and anterior margins with cautery back an additional 1 cm, completely excising these margins with the edges extending into the adjacent margins. The superior margin was done in 2 parts as it was beneath the incision and the more cephalad anterior margin was really just subcutaneous tissue after excision. Its margin was oriented and sent separately for permanent pathology. Hemostasis was obtained with cautery. The lumpectomy cavity was remarked with clips. The deep  subcutaneous tissue was closed with interrupted 3-0 Vicryl and the skin with subcuticular 4-0 Monocryl and Dermabond. Sponge needle and instrument counts were correct.   Estimated Blood Loss:  Minimal         Drains: none  Blood Given: none          Specimens: #1 inferior margin   #2 anterior margin   #3 superior margin   #4 additional anterior margin( cephalad to incision)        Complications:  * No complications entered in OR log *         Disposition: PACU - hemodynamically stable.         Condition: stable

## 2014-02-02 NOTE — Anesthesia Postprocedure Evaluation (Signed)
Anesthesia Post Note  Patient: Tamara [name]  Procedure(s) Performed: Procedure(s) (LRB): RE-EXCISION OF LEFT BREAST LUMPECTOMY (Left)  Anesthesia type: general  Patient location: PACU  Post pain: Pain level controlled  Post assessment: Patient's Cardiovascular Status Stable  Last Vitals:  Filed Vitals:   02/02/14 1215  BP: 169/74  Pulse: 55  Temp:   Resp: 24    Post vital signs: Reviewed and stable  Level of consciousness: sedated  Complications: No apparent anesthesia complications

## 2014-02-03 ENCOUNTER — Encounter (HOSPITAL_COMMUNITY): Payer: Self-pay | Admitting: General Surgery

## 2014-02-03 ENCOUNTER — Other Ambulatory Visit (INDEPENDENT_AMBULATORY_CARE_PROVIDER_SITE_OTHER): Payer: Self-pay

## 2014-02-03 DIAGNOSIS — C50919 Malignant neoplasm of unspecified site of unspecified female breast: Secondary | ICD-10-CM

## 2014-02-07 ENCOUNTER — Telehealth (INDEPENDENT_AMBULATORY_CARE_PROVIDER_SITE_OTHER): Payer: Self-pay | Admitting: General Surgery

## 2014-02-07 ENCOUNTER — Encounter: Payer: Self-pay | Admitting: Radiation Oncology

## 2014-02-07 NOTE — Telephone Encounter (Signed)
Pt called to get the results of her pathology.  Explained that Dr. Excell Seltzer is in the clinic today and will call her in the early evening to go over her path.  She understands and will wait to hear from Dr. Excell Seltzer:  [phone].

## 2014-02-07 NOTE — Progress Notes (Signed)
Location of Breast Cancer:  Left Breast  Histology per Pathology Report:   Diagnosis Breast, lumpectomy, Left - HIGH GRADE DUCTAL CARCINOMA IN SITU WITH NECROSIS AND CALCIFICATIONS. - DUCTAL CARCINOMA IN SITU IS EXTREMELY CLOSE TO ANTERIOR, INFERIOR AND SUPERIOR MARGINS (LESS THAN 0.05 CM TO EACH OF THESE MARGINS). - OTHER MARGINS ARE NEGATIVE. - SEE ONCOLOGY TEMPLATE.  1. Breast, excision, Left anterior margin - DUCTAL CARCINOMA IN SITU, HIGH GRADE, FOCALLY LESS THAN 0.1 CM TO THE NEW MARGIN. - LOBULAR NEOPLASIA (ATYPICAL LOBULAR HYPERPLASIA). 2. Breast, excision, Left inferior margin - BENIGN BREAST PARENCHYMA. - THERE IS NO EVIDENCE OF MALIGNANCY. - SEE COMMENT. 3. Breast, excision, Left superior margin - DUCTAL CARCINOMA IN SITU, HIGH GRADE, MICROSCOPIC FOCUS. - DUCTAL CARCINOMA IN SITU IS GREATER THAN 0.2 CM TO THE NEW MARGIN. 4. Breast, excision, Left anterior margin #2 - DUCTAL CARCINOMA IN SITU, HIGH GRADE, MICROSCOPIC FOCUS, FOCALLY LESS THAN 0.1 CM TO THE NEW MARGIN.  Receptor Status: ER(91% positive), PR (negative), Her2-neu (not done)  Did patient present with symptoms (if so, please note symptoms) or was this found on screening mammography?: screening mammography  Past/Anticipated interventions by surgeon, if any: 01/24/14 - Procedure: BREAST LUMPECTOMY WITH NEEDLE LOCALIZATION;  Surgeon: Edward Jolly, MD;  Location: [facility];  Service: General;  Laterality: Left; 02/02/14 Procedure: RE-EXCISION OF LEFT BREAST LUMPECTOMY;  Surgeon: Edward Jolly, MD;  Location: [facility];  Service: General;  Laterality: Left;  Past/Anticipated interventions by medical oncology, if any: unknown  Lymphedema issues, if any:  no    Pain issues, if any:  no   SAFETY ISSUES:  Prior radiation? no  Pacemaker/ICD? no  Possible current pregnancy?no  Is the patient on methotrexate? no  Current Complaints / other details:  Here with her son.  Has 3 children.  Was 89 at menarche,  47 years at birth of first child, bcp use for 6 years, used estrogen for 3 years.

## 2014-02-08 ENCOUNTER — Ambulatory Visit
Admission: RE | Admit: 2014-02-08 | Discharge: 2014-02-08 | Disposition: A | Discharge status: Home / Self Care | Payer: BC Managed Care – PPO | Source: Ambulatory Visit | Attending: Radiation Oncology | Admitting: Radiation Oncology

## 2014-02-08 ENCOUNTER — Encounter: Payer: Self-pay | Admitting: Radiation Oncology

## 2014-02-08 ENCOUNTER — Ambulatory Visit
Admission: RE | Admit: 2014-02-08 | Discharge: 2014-02-08 | Disposition: A | Discharge status: Home / Self Care | Payer: BC Managed Care – PPO | Source: Ambulatory Visit

## 2014-02-08 VITALS — BP 222/90 | HR 56 | Temp 98.9°F | Ht 65.0 in | Wt 311.5 lb

## 2014-02-08 DIAGNOSIS — G2581 Restless legs syndrome: Secondary | ICD-10-CM | POA: Insufficient documentation

## 2014-02-08 DIAGNOSIS — F329 Major depressive disorder, single episode, unspecified: Secondary | ICD-10-CM | POA: Insufficient documentation

## 2014-02-08 DIAGNOSIS — C50919 Malignant neoplasm of unspecified site of unspecified female breast: Secondary | ICD-10-CM

## 2014-02-08 DIAGNOSIS — J45909 Unspecified asthma, uncomplicated: Secondary | ICD-10-CM | POA: Insufficient documentation

## 2014-02-08 DIAGNOSIS — F3289 Other specified depressive episodes: Secondary | ICD-10-CM | POA: Insufficient documentation

## 2014-02-08 DIAGNOSIS — Z96659 Presence of unspecified artificial knee joint: Secondary | ICD-10-CM | POA: Insufficient documentation

## 2014-02-08 DIAGNOSIS — I10 Essential (primary) hypertension: Secondary | ICD-10-CM | POA: Insufficient documentation

## 2014-02-08 DIAGNOSIS — G4733 Obstructive sleep apnea (adult) (pediatric): Secondary | ICD-10-CM | POA: Insufficient documentation

## 2014-02-08 DIAGNOSIS — Z79899 Other long term (current) drug therapy: Secondary | ICD-10-CM | POA: Insufficient documentation

## 2014-02-08 DIAGNOSIS — E785 Hyperlipidemia, unspecified: Secondary | ICD-10-CM | POA: Insufficient documentation

## 2014-02-08 HISTORY — DX: Malignant neoplasm of unspecified site of unspecified female breast: C50.919

## 2014-02-08 NOTE — Progress Notes (Signed)
Please see the Nurse Progress Note in the MD Initial Consult Encounter for this patient. 

## 2014-02-08 NOTE — Progress Notes (Signed)
Radiation Oncology         [phone] ________________________________  Initial outpatient Consultation  Name: Tamara [name] MRN: [id number]  Date: 02/08/2014  DOB: [birth year]  KC:LEXN,TZGYFVC ALLEN, MD  Hoxworth, Darene Lamer, MD   REFERRING PHYSICIAN: Edward Jolly, MD  DIAGNOSIS: High-grade intraductal carcinoma of the left breast  HISTORY OF PRESENT ILLNESS::Tamara [name] is a 63 y.o. female who is seen out courtesy of Dr. Excell Seltzer for an opinion concerning radiation therapy as part of management of patient's recently diagnosed intraductal breast cancer..The patient recently presented for screening mammogram and there were new calcifications in the left breast. Diagnostic mammogram was performed which I have personally reviewed. This showed a cluster area of pleomorphic calcifications measuring 2.6 x 1.5 x 1.3 cm in the retroareolar area of the left breast. No associated mass. Due to the patient's weight this was unable to be biopsied stereotactically and open excisional biopsy was recommended. This was performed by Dr Excell Seltzer which revealed ductal carcinoma in situ, high-grade extending over approximately 2.1 cm. She had 3 very close margins at inside of 1 mm and reexcision of her lumpectomy was performed to obtain adequate margins. This reexcision was performed on 02/02/2014. All margins were cleared except the anterior margin on this reexcision. Focally the anterior margin was less than 1 mm and showed ductal carcinoma in situ, high-grade. With this information the patient is now seen in radiation oncology.   PREVIOUS RADIATION THERAPY: No  PAST MEDICAL HISTORY:  has a past medical history of Obese; Cellulitis and abscess of leg; Wears glasses; Hearing loss; Hyperlipidemia; Hypertension; Asthma; History of bronchitis (1966); Sleep apnea, obstructive; Arthritis; Joint pain; Joint swelling; Peripheral edema; Depression; Complication of anesthesia; Restless legs syndrome;  and Breast cancer.    PAST SURGICAL HISTORY: Past Surgical History  Procedure Laterality Date  . Tubal ligation    . Inner ear surgery Bilateral     for hearing loss  . Dilatation & curettage/hysteroscopy with trueclear N/A 01/06/2014    Procedure: DILATATION & CURETTAGE/HYSTEROSCOPY WITH TRUCLEAR;  Surgeon: Shon Millet II, MD;  Location: [facility];  Service: Gynecology;  Laterality: N/A;  . Joint replacement Right     Knee  . Colonoscopy    . Breast lumpectomy with needle localization Left 01/24/2014    Procedure: BREAST LUMPECTOMY WITH NEEDLE LOCALIZATION;  Surgeon: Edward Jolly, MD;  Location: [facility];  Service: General;  Laterality: Left;  . Re-excision of breast lumpectomy Left 02/02/2014    Procedure: RE-EXCISION OF LEFT BREAST LUMPECTOMY;  Surgeon: Edward Jolly, MD;  Location: [facility];  Service: General;  Laterality: Left;    FAMILY HISTORY: family history includes Asthma in her other; Dementia in her mother; Esophageal cancer in her father; Heart attack in her other; Hypertension in her other; Throat cancer in her paternal grandfather; Thyroid disease in her other.  SOCIAL HISTORY:  reports that she has never smoked. She does not have any smokeless tobacco history on file. She reports that she does not drink alcohol or use illicit drugs. [place]  ALLERGIES: Review of patient's allergies indicates no known allergies.  MEDICATIONS:  Current Outpatient Prescriptions  Medication Sig Dispense Refill  . albuterol (PROVENTIL HFA;VENTOLIN HFA) 108 (90 BASE) MCG/ACT inhaler Inhale 2 puffs into the lungs every 6 (six) hours as needed for wheezing or shortness of breath.      Marland Kitchen aspirin 81 MG tablet Take 81 mg by mouth daily.        Marland Kitchen  carvedilol (COREG) 25 MG tablet Take 25 mg by mouth 2 (two) times daily with a meal.      . diphenhydramine-acetaminophen (TYLENOL PM) 25-500 MG TABS Take 2 tablets by mouth at bedtime as needed (sleep).      .  divalproex (DEPAKOTE ER) 250 MG 24 hr tablet Take 250 mg by mouth daily.      . DUAC gel Apply 1 application topically as needed (rash).       . fluconazole (DIFLUCAN) 150 MG tablet Take 150 mg by mouth every other day. Only as needed for yeast      . fluticasone (CUTIVATE) 0.05 % cream Apply 1 application topically as needed (rash).       . furosemide (LASIX) 20 MG tablet Take 20 mg by mouth daily. Takes 40 mg in the am & 20 mg at noon      . furosemide (LASIX) 40 MG tablet Take 40 mg by mouth daily. Takes 40 mg in the am & 20 mg at noon      . ketoconazole (NIZORAL) 2 % cream Apply 1 application topically daily as needed (rash).       Marland Kitchen ketoconazole (NIZORAL) 200 MG tablet Take 200 mg by mouth daily.       Marland Kitchen losartan (COZAAR) 100 MG tablet Take 100 mg by mouth daily.      Marland Kitchen PARoxetine (PAXIL) 40 MG tablet Take 40 mg by mouth daily.      . potassium chloride SA (K-DUR,KLOR-CON) 20 MEQ tablet Take 20 mEq by mouth daily.      . simvastatin (ZOCOR) 20 MG tablet Take 20 mg by mouth every evening.      . traMADol (ULTRAM) 50 MG tablet Take 50 mg by mouth every 8 (eight) hours as needed for moderate pain.       No current facility-administered medications for this encounter.    REVIEW OF SYSTEMS:  A 15 point review of systems is documented in the electronic medical record. This was obtained by the nursing staff. However, I reviewed this with the patient to discuss relevant findings and make appropriate changes.  Prior to diagnosis she denied any pain in the left breast area in the discharge or bleeding. She has some mild discomfort in the left breast but is not requiring any pain medication for this issue. Patient denies any swelling in her left arm or hand. She denies any new bony pain headaches dizziness or blurred vision.   PHYSICAL EXAM:  height is '5\' 5"'  (1.651 m) and weight is 311 lb 8 oz (141.295 kg). Her temperature is 98.9 F (37.2 C). Her blood pressure is 222/90 and her pulse is 56.   BP  222/90  Pulse 56  Temp(Src) 98.9 F (37.2 C)  Ht '5\' 5"'  (1.651 m)  Wt 311 lb 8 oz (141.295 kg)  BMI 51.84 kg/m2  General Appearance:    Alert, cooperative, no distress, appears stated age, accompanied by her son on evaluation today   Head:    Normocephalic, without obvious abnormality, atraumatic  Eyes:    PERRL, conjunctiva/corneas clear, EOM's intact,     Ears:    Normal TM's and external ear canals, both ears,  hearing aids in place on both sides   Nose:   Nares normal, septum midline, mucosa normal, no drainage    or sinus tenderness  Throat:   Lips, mucosa, and tongue normal; teeth and gums normal  Neck:   Supple, symmetrical, trachea midline, no adenopathy;  thyroid:  no enlargement/tenderness/nodules; no carotid   bruit or JVD  Back:     Symmetric, no curvature, ROM normal, no CVA tenderness  Lungs:     Clear to auscultation bilaterally, respirations unlabored  Chest Wall:    No tenderness or deformity   Heart:    Regular rate and rhythm, S1 and S2 normal, no murmur, rub   or gallop  Breast Exam:    right breast no tenderness, masses, or nipple abnormality; the left breast shows a well healing scar periareolar upper aspect of the breast   Abdomen:     Soft, non-tender, bowel sounds active all four quadrants,    no masses, no organomegaly        Extremities:   Extremities normal, atraumatic, no cyanosis, some edema in the ankle and foot area  Pulses:   2+ and symmetric all extremities  Skin:   Skin color, texture, turgor normal, no rashes or lesions  Lymph nodes:   Cervical, supraclavicular, and axillary nodes normal  Neurologic:   normal strength, sensation and reflexes    throughout    ECOG = 0  0 - Asymptomatic (Fully active, able to carry on all predisease activities without restriction)  LABORATORY DATA:  Lab Results  Component Value Date   WBC 5.8 02/02/2014   HGB 14.1 02/02/2014   HCT 41.3 02/02/2014   MCV 85.2 02/02/2014   PLT 174 02/02/2014   Lab Results    Component Value Date   NA 139 02/02/2014   K 4.4 02/02/2014   CL 99 02/02/2014   CO2 22 02/02/2014   Lab Results  Component Value Date   ALT 22 09/21/2013   AST 22 09/21/2013   ALKPHOS 54 09/21/2013   BILITOT 0.5 09/21/2013     RADIOGRAPHY: Dg Chest 2 View  01/16/2014   CLINICAL DATA:  Preop for left breast surgery  EXAM: CHEST  2 VIEW  COMPARISON:  Chest x-ray of 08/08/2011  FINDINGS: No active infiltrate or effusion is seen. There is some peribronchial thickening which may indicate bronchitis. Cardiomegaly is stable. No acute bony abnormality is seen.  IMPRESSION: Stable cardiomegaly and probable bronchitis. No active lung disease.   Electronically Signed   By: Ivar Drape M.D.   On: 01/16/2014 16:13   Mm Lt Plc Breast Loc Dev   1st Lesion  Inc Mammo Guide  01/24/2014   CLINICAL DATA:  The patient presents for needle localization of left breast calcifications.  EXAM: NEEDLE LOCALIZATION OF THE LEFT BREAST WITH MAMMO GUIDANCE  COMPARISON:  Previous exams.  FINDINGS: Patient presents for needle localization prior to excisional biopsy. I met with the patient and we discussed the procedure of needle localization including benefits and alternatives. We discussed the high likelihood of a successful procedure. We discussed the risks of the procedure, including infection, bleeding, tissue injury, and further surgery. Informed, written consent was given. The usual time-out protocol was performed immediately prior to the procedure.  Using mammographic guidance, sterile technique, 2% lidocaine and a 9 cm modified Kopans needle, calcifications in the upper central portion of the left breast are localized using cephalad approach. The films were marked for Dr. Excell Seltzer.  Specimen radiograph was performed, confirming calcifications to be present in the tissue sample. The specimen was marked for pathology.  IMPRESSION: Needle localization of the left breast. No apparent complications.   Electronically Signed   By:  Shon Hale M.D.   On: 01/24/2014 10:57      IMPRESSION: High-grade intraductal breast  cancer. Patient's initial lumpectomy showed a tumor extending over approximately 2.1 cm. She had close  initial margins and therefore reexcision was performed. All remaining margins were clear except those anteriorly. There was no ink on the margin but less than 1 mm margin anteriorly. I discussed this close margin with the patient and her son. We discussed options for treatment including an additional reexcision versus boosting this area with radiation therapy. With additional surgery this may potentially cause some nipple retraction and a less favorable cosmetic outcome. At this time the patient feels most comfortable with proceeding with radiation therapy directed at the left breast followed by a boost to the lumpectomy cavity area.  PLAN: Simulation and planning on March 31 with treatments to begin approximately 5 weeks postop  I spent 45 minutes minutes face to face with the patient and more than 50% of that time was spent in counseling and/or coordination of care.  Total time in the encounter was 65 minutes.   ------------------------------------------------  -----------------------------------  Blair Promise, PhD, MD

## 2014-02-09 ENCOUNTER — Telehealth: Payer: Self-pay | Admitting: Oncology

## 2014-02-09 NOTE — Telephone Encounter (Signed)
Tamara [name] called to verify that treatment is everyday for 7 weeks.  Advised her that treatment is 5 days a week (Monday-Friday) and is usually 7 weeks.  Tamara [name] verbalized understanding.

## 2014-02-10 ENCOUNTER — Encounter (INDEPENDENT_AMBULATORY_CARE_PROVIDER_SITE_OTHER): Payer: Self-pay | Admitting: General Surgery

## 2014-02-10 ENCOUNTER — Ambulatory Visit (INDEPENDENT_AMBULATORY_CARE_PROVIDER_SITE_OTHER): Payer: BC Managed Care – PPO | Admitting: General Surgery

## 2014-02-10 VITALS — BP 162/90 | HR 74 | Temp 98.6°F | Resp 20 | Ht 66.0 in | Wt 308.0 lb

## 2014-02-10 DIAGNOSIS — D0510 Intraductal carcinoma in situ of unspecified breast: Secondary | ICD-10-CM

## 2014-02-10 DIAGNOSIS — D0590 Unspecified type of carcinoma in situ of unspecified breast: Secondary | ICD-10-CM

## 2014-02-10 NOTE — Patient Instructions (Signed)
Note activity or work restrictions

## 2014-02-10 NOTE — Progress Notes (Signed)
Chief complaint: Followup lumpectomy  History: Patient returns for followup of reexcision of margins for ductal carcinoma in situ of the left breast. She did not initially have a core biopsy due to body habitus and excisional biopsy revealed 2.1 cm area of DCIS with free close or positive margins. Final reexcision cleared all margins although her anterior margin was just less than 1 mm. The anterior margin was essentially skin and subcutaneous tissue as documented in her operative note. Dr. Sondra Come and I have discussed with her options of reexcision anterior margin which would include breast areolar skin versus radiation therapy boost which she understands is not strictly within and NCCN guidelines in this situation I feel is perfectly acceptable. She is comfortable with proceeding with radiation.  Exam: BP 162/90  Pulse 74  Temp(Src) 98.6 F (37 C) (Oral)  Resp 20  Ht 5\' 6"  (1.676 m)  Wt 308 lb (139.708 kg)  BMI 49.74 kg/m2 Left breast wound is well healed without, getting factors.  Assessment and plan: Doing well following lumpectomy for DCIS as above. To start radiation soon. I will see her back in 6 months for long-term followup.

## 2014-02-13 ENCOUNTER — Telehealth: Payer: Self-pay | Admitting: *Deleted

## 2014-02-13 NOTE — Telephone Encounter (Signed)
Received appt date and time from Dr. Jana Hakim on Friday and I called and confirmed 04/06/14 appt w/ pt.  Unable to schedule lab appt - emailed Audie Clear to enter it.  Mailed before appt letter, welcome packet & intake form to pt.  Emailed Christy at [employer] to make her aware.  Took paperwork to Med Rec for chart.

## 2014-02-14 ENCOUNTER — Encounter: Payer: Self-pay | Admitting: Radiation Oncology

## 2014-02-17 ENCOUNTER — Encounter: Payer: Self-pay | Admitting: *Deleted

## 2014-02-17 NOTE — Progress Notes (Signed)
[place] Psychosocial Distress Screening Clinical Social Work  Clinical Social Work was referred by distress screening protocol.  The patient scored a 7 on the Psychosocial Distress Thermometer which indicates moderate distress. Clinical Social Worker phoned pt to assess for distress and other psychosocial needs. She was currently at work and unable to discuss concerns. She would like CSW follow up on 02/28/14 when here for her sim appt. CSW plans to see her then. Pt feels that is best, as she can not talk during work hours.    Clinical Social Worker follow up needed: yes  If yes, follow up plan: See at sim appt on 3/31  Loren Racer, [place] Worker Josephina Shih. [place] for [place] Wednesday, Thursday and Friday Phone: [phone] Fax: [phone]

## 2014-02-28 ENCOUNTER — Encounter: Payer: Self-pay | Admitting: *Deleted

## 2014-02-28 ENCOUNTER — Ambulatory Visit
Admission: RE | Admit: 2014-02-28 | Discharge: 2014-02-28 | Disposition: A | Discharge status: Home / Self Care | Payer: BC Managed Care – PPO | Source: Ambulatory Visit | Attending: Radiation Oncology | Admitting: Radiation Oncology

## 2014-02-28 DIAGNOSIS — D0510 Intraductal carcinoma in situ of unspecified breast: Secondary | ICD-10-CM

## 2014-02-28 DIAGNOSIS — C50419 Malignant neoplasm of upper-outer quadrant of unspecified female breast: Secondary | ICD-10-CM | POA: Insufficient documentation

## 2014-02-28 DIAGNOSIS — Z510 Encounter for antineoplastic radiation therapy: Secondary | ICD-10-CM | POA: Insufficient documentation

## 2014-02-28 NOTE — Progress Notes (Signed)
Name: Tamara [name]   MRN: [id number]  Date:  02/28/2014  DOB: [birth year]  Status:outpatient   DIAGNOSIS: Left Breast cancer.  CONSENT VERIFIED: yes SET UP: Patient is setup supine  IMMOBILIZATION:  The following immobilization was used:Custom Moldable Pillow, breast board.  NARRATIVE: [name] was brought to the [facility].  Identity was confirmed.  All relevant records and images related to the planned course of therapy were reviewed.  Then, the patient was positioned in a stable reproducible clinical set-up for radiation therapy.  Wires were placed to delineate the clinical extent of breast tissue. A wire was placed on the scar as well.  CT images were obtained.  An isocenter was placed. Skin markings were placed.  The position of the heart was then analyzed.  Due to the proximity of the heart to the chest wall, I felt she would benefit from deep inspiration breath hold for cardiac sparing.  She was then coached and rescanned in the breath hold position.  Acceptable cardiac sparing was achieved. The CT images were loaded into the planning software where the target and avoidance structures were contoured.  The radiation prescription was entered and confirmed. The patient was discharged in stable condition and tolerated simulation well.    TREATMENT PLANNING NOTE/3D Simulation Note Treatment planning then occurred. I have requested : MLC's, isodose plan, basic dose calculation  3D simulation was performed.  I personally designed and supervised the construction of 3 medically necessary complex treatment devices in the form of MLCs which will be used for beam modification and to protect critical structures including the heart and lung as well as the immobilization device which is necessary for reproducible set up.  I have requested a dose volume histogram of the heart, lung and tumor cavity.    RESPIRATORY MOTION MANAGEMENT SIMULATION - Deep Inspiration Breath  Hold  NARRATIVE:  In order to account for effect of respiratory motion on target structures and other organs in the planning and delivery of radiotherapy, this patient underwent respiratory motion management simulation.  To accomplish this, when the patient was brought to the CT simulation planning suite, a bellows was placed on the her abdomen.  Wave forms of the patient's breathing were obtained. Coaching was performed and practice sessions initiated to monitor her ability to obtain and maintain deep inspiration breath hold.  The CT images were loaded into the planning software and fused with her free breathing images by physics.  Acceptable cardiac sparing was achieved through the use of deep inspiration breath hold.  Planning will be performed on her breath hold scan

## 2014-02-28 NOTE — Progress Notes (Signed)
Radiation Oncology         [phone] ________________________________  Name: Tamara [name]      MRN: [id number]          Date: 02/28/2014              DOB: August 01, 1951  Optical Surface Tracking Plan:  Since intensity modulated radiotherapy (IMRT) and 3D conformal radiation treatment methods are predicated on accurate and precise positioning for treatment, intrafraction motion monitoring is medically necessary to ensure accurate and safe treatment delivery.  The ability to quantify intrafraction motion without excessive ionizing radiation dose can only be performed with optical surface tracking. Accordingly, surface imaging offers the opportunity to obtain 3D measurements of patient position throughout IMRT and 3D treatments without excessive radiation exposure.  I am ordering optical surface tracking for this patient's upcoming course of radiotherapy. ________________________________ Signature   Reference:   Ursula Alert, J, et al. Surface imaging-based analysis of intrafraction motion for breast radiotherapy patients.Journal of [place], n. 6, nov. 2014. ISSN [id number].   Available at: <http://www.jacmp.org/index.php/jacmp/article/view/4957>.

## 2014-02-28 NOTE — Progress Notes (Signed)
[place] Psychosocial Distress Screening Clinical Social Work  Clinical Social Work was referred by distress screening protocol.  The patient scored a 7 on the Psychosocial Distress Thermometer which indicates moderate distress. Clinical Education officer, museum met with pt in office at [facility] to assess for distress and other psychosocial needs.  Pt stated she experienced increased anxiety initially, but feels "much better" now and is taking things "one day at a time".  CSW informed pt of the support team and support services at [facility].  Pt described a strong support system in her 3 adult children, daughter in-laws, and 8 year old granddaughter.  CSW encouraged pt to call with any questions or concerns.        Johnnye Lana, MSW, [place] Worker [facility] [phone]

## 2014-03-01 ENCOUNTER — Encounter: Payer: Self-pay | Admitting: Radiation Oncology

## 2014-03-07 ENCOUNTER — Ambulatory Visit
Admission: RE | Admit: 2014-03-07 | Discharge: 2014-03-07 | Disposition: A | Discharge status: Home / Self Care | Payer: BC Managed Care – PPO | Source: Ambulatory Visit | Attending: Radiation Oncology | Admitting: Radiation Oncology

## 2014-03-07 DIAGNOSIS — D0510 Intraductal carcinoma in situ of unspecified breast: Secondary | ICD-10-CM

## 2014-03-07 NOTE — Progress Notes (Signed)
  Radiation Oncology         [phone] ________________________________  Name: Tamara [name] MRN: [id number]  Date: 03/07/2014  DOB: 1951-08-17  Simulation Verification Note  Status: outpatient  NARRATIVE: The patient was brought to the treatment unit and placed in the planned treatment position. The clinical setup was verified. Then port films were obtained and uploaded to the radiation oncology medical record software.  The treatment beams were carefully compared against the planned radiation fields. The position location and shape of the radiation fields was reviewed. They targeted volume of tissue appears to be appropriately covered by the radiation beams. Organs at risk appear to be excluded as planned.  Based on my personal review, I approved the simulation verification. The patient's treatment will proceed as planned.  -----------------------------------  Blair Promise, PhD, MD

## 2014-03-08 ENCOUNTER — Ambulatory Visit
Admission: RE | Admit: 2014-03-08 | Discharge: 2014-03-08 | Disposition: A | Discharge status: Home / Self Care | Payer: BC Managed Care – PPO | Source: Ambulatory Visit | Attending: Radiation Oncology | Admitting: Radiation Oncology

## 2014-03-09 ENCOUNTER — Ambulatory Visit
Admission: RE | Admit: 2014-03-09 | Discharge: 2014-03-09 | Disposition: A | Discharge status: Home / Self Care | Payer: BC Managed Care – PPO | Source: Ambulatory Visit | Attending: Radiation Oncology | Admitting: Radiation Oncology

## 2014-03-10 ENCOUNTER — Ambulatory Visit
Admission: RE | Admit: 2014-03-10 | Discharge: 2014-03-10 | Disposition: A | Discharge status: Home / Self Care | Payer: BC Managed Care – PPO | Source: Ambulatory Visit | Attending: Radiation Oncology | Admitting: Radiation Oncology

## 2014-03-10 DIAGNOSIS — D0510 Intraductal carcinoma in situ of unspecified breast: Secondary | ICD-10-CM

## 2014-03-10 MED ORDER — RADIAPLEXRX EX GEL
Freq: Once | CUTANEOUS | Status: AC
  Administered 2014-03-10: 17:00:00 via TOPICAL

## 2014-03-10 MED ORDER — ALRA NON-METALLIC DEODORANT (RAD-ONC)
1.0000 "application " | Freq: Once | TOPICAL | Status: AC
  Administered 2014-03-10: 1 via TOPICAL

## 2014-03-10 NOTE — Progress Notes (Signed)
Tamara [name] was given the Radiation Therapy and You book and discussed potential side effects/management of pain, fatigue and skin changes.  She was also given the skin care handout.  She was given Alra Deoderant and radiaplex gel.  She was instructed to apply the radiaplex gel to her left breast/underarm twice a day after treatment and at bedtime.  She was educated about under treat day with Dr. Sondra Come on Tuesday's.  She was given my business card and was advised to call with any questions or concerns.

## 2014-03-13 ENCOUNTER — Ambulatory Visit
Admission: RE | Admit: 2014-03-13 | Discharge: 2014-03-13 | Disposition: A | Discharge status: Home / Self Care | Payer: BC Managed Care – PPO | Source: Ambulatory Visit | Attending: Radiation Oncology | Admitting: Radiation Oncology

## 2014-03-14 ENCOUNTER — Ambulatory Visit
Admission: RE | Admit: 2014-03-14 | Discharge: 2014-03-14 | Disposition: A | Discharge status: Home / Self Care | Payer: BC Managed Care – PPO | Source: Ambulatory Visit | Attending: Radiation Oncology | Admitting: Radiation Oncology

## 2014-03-14 ENCOUNTER — Encounter: Payer: Self-pay | Admitting: Radiation Oncology

## 2014-03-14 VITALS — BP 200/97 | HR 56 | Temp 98.4°F | Ht 66.0 in | Wt 309.0 lb

## 2014-03-14 DIAGNOSIS — D0510 Intraductal carcinoma in situ of unspecified breast: Secondary | ICD-10-CM

## 2014-03-14 NOTE — Progress Notes (Signed)
Tamara [name] has received 5 fractions to her left breast.  No voiced concerns.

## 2014-03-14 NOTE — Progress Notes (Signed)
  Radiation Oncology         [phone] ________________________________  Name: Tamara [name] MRN: [id number]  Date: 03/14/2014  DOB: 05-06-1951  Weekly Radiation Therapy Management  Current Dose: 9 Gy     Planned Dose:  64.4 Gy  Narrative . . . . . . . . The patient presents for routine under treatment assessment.                                   The patient is without complaint.                                 Set-up films were reviewed.                                 The chart was checked. Physical Findings. . .  height is 5\' 6"  (1.676 m) and weight is 309 lb (140.161 kg). Her temperature is 98.4 F (36.9 C). Her blood pressure is 200/97 and her pulse is 56. . Weight essentially stable.  She appears to have an early fungal infection in the bilateral inframammary fold areas Impression . . . . . . . The patient is tolerating radiation. Plan . . . . . . . . . . . . Continue treatment as planned.  She will start using her fungal cream at morning and at night after her treatment.  ________________________________   Blair Promise, PhD, MD

## 2014-03-15 ENCOUNTER — Ambulatory Visit
Admission: RE | Admit: 2014-03-15 | Discharge: 2014-03-15 | Disposition: A | Discharge status: Home / Self Care | Payer: BC Managed Care – PPO | Source: Ambulatory Visit | Attending: Radiation Oncology | Admitting: Radiation Oncology

## 2014-03-16 ENCOUNTER — Ambulatory Visit
Admission: RE | Admit: 2014-03-16 | Discharge: 2014-03-16 | Disposition: A | Discharge status: Home / Self Care | Payer: BC Managed Care – PPO | Source: Ambulatory Visit | Attending: Radiation Oncology | Admitting: Radiation Oncology

## 2014-03-17 ENCOUNTER — Ambulatory Visit
Admission: RE | Admit: 2014-03-17 | Discharge: 2014-03-17 | Disposition: A | Discharge status: Home / Self Care | Payer: BC Managed Care – PPO | Source: Ambulatory Visit | Attending: Radiation Oncology | Admitting: Radiation Oncology

## 2014-03-20 ENCOUNTER — Ambulatory Visit
Admission: RE | Admit: 2014-03-20 | Discharge: 2014-03-20 | Disposition: A | Discharge status: Home / Self Care | Payer: BC Managed Care – PPO | Source: Ambulatory Visit | Attending: Radiation Oncology | Admitting: Radiation Oncology

## 2014-03-21 ENCOUNTER — Ambulatory Visit
Admission: RE | Admit: 2014-03-21 | Discharge: 2014-03-21 | Disposition: A | Discharge status: Home / Self Care | Payer: BC Managed Care – PPO | Source: Ambulatory Visit | Attending: Radiation Oncology | Admitting: Radiation Oncology

## 2014-03-21 VITALS — BP 190/98 | HR 59 | Temp 98.4°F | Ht 66.0 in | Wt 312.4 lb

## 2014-03-21 DIAGNOSIS — D0510 Intraductal carcinoma in situ of unspecified breast: Secondary | ICD-10-CM

## 2014-03-21 NOTE — Progress Notes (Signed)
  Radiation Oncology         [phone] ________________________________  Name: Tamara [name] MRN: [id number]  Date: 03/21/2014  DOB: January 14, 1951  Weekly Radiation Therapy Management  Current Dose: 18 Gy     Planned Dose:  64.4 Gy  Narrative . . . . . . . . The patient presents for routine under treatment assessment.                                   The patient is without complaint for some mild fatigue.                                 Set-up films were reviewed.                                 The chart was checked. Physical Findings. . .  height is 5\' 6"  (1.676 m) and weight is 312 lb 6.4 oz (141.704 kg). Her temperature is 98.4 F (36.9 C). Her blood pressure is 190/98 and her pulse is 59. . Weight essentially stable.  Mild erythema noted in the left breast area. Impression . . . . . . . The patient is tolerating radiation. Plan . . . . . . . . . . . . Continue treatment as planned.  ________________________________   Blair Promise, PhD, MD

## 2014-03-21 NOTE — Progress Notes (Signed)
Tamara [name] has had 10 fractions to her left breast.  She denies pain.  She reports fatigue.   The skin on her left breast is pink and intact.  She has noticed a firm area underneath her lumpectomy scar.  She is using radiaplex gel.  Her bp is elevated today at 190/98.  She says her bp is always high at the doctor's office.

## 2014-03-22 ENCOUNTER — Ambulatory Visit
Admission: RE | Admit: 2014-03-22 | Discharge: 2014-03-22 | Disposition: A | Discharge status: Home / Self Care | Payer: BC Managed Care – PPO | Source: Ambulatory Visit | Attending: Radiation Oncology | Admitting: Radiation Oncology

## 2014-03-23 ENCOUNTER — Ambulatory Visit
Admission: RE | Admit: 2014-03-23 | Discharge: 2014-03-23 | Disposition: A | Discharge status: Home / Self Care | Payer: BC Managed Care – PPO | Source: Ambulatory Visit | Attending: Radiation Oncology | Admitting: Radiation Oncology

## 2014-03-24 ENCOUNTER — Other Ambulatory Visit: Payer: Self-pay | Admitting: *Deleted

## 2014-03-24 ENCOUNTER — Encounter: Payer: Self-pay | Admitting: *Deleted

## 2014-03-24 ENCOUNTER — Ambulatory Visit
Admission: RE | Admit: 2014-03-24 | Discharge: 2014-03-24 | Disposition: A | Discharge status: Home / Self Care | Payer: BC Managed Care – PPO | Source: Ambulatory Visit | Attending: Radiation Oncology | Admitting: Radiation Oncology

## 2014-03-24 DIAGNOSIS — C50412 Malignant neoplasm of upper-outer quadrant of left female breast: Secondary | ICD-10-CM

## 2014-03-24 DIAGNOSIS — Z170 Estrogen receptor positive status [ER+]: Secondary | ICD-10-CM | POA: Insufficient documentation

## 2014-03-24 NOTE — Progress Notes (Signed)
Completed chart, labs entered, added to spreadsheet and placed in Dr. Magrinat's box.  

## 2014-03-27 ENCOUNTER — Ambulatory Visit
Admission: RE | Admit: 2014-03-27 | Discharge: 2014-03-27 | Disposition: A | Discharge status: Home / Self Care | Payer: BC Managed Care – PPO | Source: Ambulatory Visit | Attending: Radiation Oncology | Admitting: Radiation Oncology

## 2014-03-28 ENCOUNTER — Ambulatory Visit
Admission: RE | Admit: 2014-03-28 | Discharge: 2014-03-28 | Disposition: A | Discharge status: Home / Self Care | Payer: BC Managed Care – PPO | Source: Ambulatory Visit | Attending: Radiation Oncology | Admitting: Radiation Oncology

## 2014-03-28 VITALS — BP 198/84 | HR 72 | Temp 98.3°F | Ht 66.0 in | Wt 312.0 lb

## 2014-03-28 DIAGNOSIS — C50412 Malignant neoplasm of upper-outer quadrant of left female breast: Secondary | ICD-10-CM

## 2014-03-28 NOTE — Progress Notes (Signed)
  Radiation Oncology         [phone] ________________________________  Name: Tamara [name] MRN: [id number]  Date: 03/28/2014  DOB: [birth year]  Weekly Radiation Therapy Management  Current Dose: 27 Gy     Planned Dose:  64.4 Gy  Narrative . . . . . . . . The patient presents for routine under treatment assessment.                                   The patient is without complaint except for some soreness in the breast area                                 Set-up films were reviewed.                                 The chart was checked. Physical Findings. . .  height is 5\' 6"  (1.676 m) and weight is 312 lb (141.522 kg). Her temperature is 98.3 F (36.8 C). Her blood pressure is 198/84 and her pulse is 72. . Weight essentially stable.  The left breast area shows some erythema particularly in the inframammary fold region,  no skin breakdown is appreciated. Impression . . . . . . . The patient is tolerating radiation. Plan . . . . . . . . . . . . Continue treatment as planned.  ________________________________   Blair Promise, PhD, MD

## 2014-03-28 NOTE — Progress Notes (Signed)
Tamara [name] has had 15 fractions to her left breast.  She denies pain but does have soreness in her left breast.  She reports fatigue.  The skin on her left breast is pink and intact.  She is using radiaplex gel twice a day.  Her bp today was elevated at 198/84.  She is taking coreg and losartan.

## 2014-03-29 ENCOUNTER — Ambulatory Visit
Admission: RE | Admit: 2014-03-29 | Discharge: 2014-03-29 | Disposition: A | Discharge status: Home / Self Care | Payer: BC Managed Care – PPO | Source: Ambulatory Visit | Attending: Radiation Oncology | Admitting: Radiation Oncology

## 2014-03-30 ENCOUNTER — Ambulatory Visit
Admission: RE | Admit: 2014-03-30 | Discharge: 2014-03-30 | Disposition: A | Discharge status: Home / Self Care | Payer: BC Managed Care – PPO | Source: Ambulatory Visit | Attending: Radiation Oncology | Admitting: Radiation Oncology

## 2014-03-30 ENCOUNTER — Encounter: Payer: Self-pay | Admitting: Family Medicine

## 2014-03-30 ENCOUNTER — Ambulatory Visit (INDEPENDENT_AMBULATORY_CARE_PROVIDER_SITE_OTHER): Payer: BC Managed Care – PPO | Admitting: Family Medicine

## 2014-03-30 VITALS — BP 174/110 | Temp 98.7°F

## 2014-03-30 DIAGNOSIS — F3289 Other specified depressive episodes: Secondary | ICD-10-CM

## 2014-03-30 DIAGNOSIS — I10 Essential (primary) hypertension: Secondary | ICD-10-CM

## 2014-03-30 DIAGNOSIS — F329 Major depressive disorder, single episode, unspecified: Secondary | ICD-10-CM

## 2014-03-30 MED ORDER — LOSARTAN POTASSIUM 100 MG PO TABS: ORAL_TABLET | ORAL | Status: DC

## 2014-03-30 MED ORDER — CARVEDILOL 25 MG PO TABS: ORAL_TABLET | ORAL | Status: DC

## 2014-03-30 MED ORDER — LORAZEPAM 1 MG PO TABS: ORAL_TABLET | ORAL | Status: DC

## 2014-03-30 NOTE — Progress Notes (Signed)
   Subjective:    Patient ID: Tamara [name], female    DOB: [birth year], 63 y.o.   MRN: [id number]  HPI [name] is a 63 year old female who comes in today for followup of hypertension  Her blood pressure today is 174/110. She's been compliant with her medication except for Lasix which she can't take and work.  She tapered off her Prozac because she was told she was told it caused weight gain.  She's in the process of being treated for breast cancer   Review of Systems    review of systems otherwise negative Objective:   Physical Exam  Well-developed well-nourished female no acute distress BP right arm sitting position 174/110      Assessment & Plan:  Hypertension not at goal increase medication as outlined followup in one month  History of anxiety depression now breast cancer........Marland Kitchen Ativan 0.5 twice a day

## 2014-03-30 NOTE — Progress Notes (Signed)
Pre visit review using our clinic review tool, if applicable. No additional management support is needed unless otherwise documented below in the visit note. 

## 2014-03-30 NOTE — Patient Instructions (Signed)
Corgard 25 mg,,,,,,,,,, 2 in the morning one at bedtime  Losartan 100 mg,,,,,,, one twice daily  Ativan 1 mg,,,,,,,,,,,, one half to one tablet twice daily when necessary  Followup in 4 weeks

## 2014-03-31 ENCOUNTER — Ambulatory Visit
Admission: RE | Admit: 2014-03-31 | Discharge: 2014-03-31 | Disposition: A | Discharge status: Home / Self Care | Payer: BC Managed Care – PPO | Source: Ambulatory Visit | Attending: Radiation Oncology | Admitting: Radiation Oncology

## 2014-04-03 ENCOUNTER — Ambulatory Visit: Payer: BC Managed Care – PPO | Admitting: Neurology

## 2014-04-03 ENCOUNTER — Ambulatory Visit
Admission: RE | Admit: 2014-04-03 | Discharge: 2014-04-03 | Disposition: A | Discharge status: Home / Self Care | Payer: BC Managed Care – PPO | Source: Ambulatory Visit | Attending: Radiation Oncology | Admitting: Radiation Oncology

## 2014-04-04 ENCOUNTER — Encounter: Payer: Self-pay | Admitting: Radiation Oncology

## 2014-04-04 ENCOUNTER — Ambulatory Visit
Admission: RE | Admit: 2014-04-04 | Discharge: 2014-04-04 | Disposition: A | Discharge status: Home / Self Care | Payer: BC Managed Care – PPO | Source: Ambulatory Visit | Attending: Radiation Oncology | Admitting: Radiation Oncology

## 2014-04-04 VITALS — BP 191/91 | HR 54 | Temp 98.1°F | Ht 66.0 in | Wt 310.7 lb

## 2014-04-04 DIAGNOSIS — C50412 Malignant neoplasm of upper-outer quadrant of left female breast: Secondary | ICD-10-CM

## 2014-04-04 NOTE — Progress Notes (Signed)
  Radiation Oncology         [phone] ________________________________  Name: Tamara [name] MRN: [id number]  Date: 04/04/2014  DOB: Nov 09, 1951  Weekly Radiation Therapy Management  DCIS (ductal carcinoma in situ) of breast   Primary site: Breast (Left)   Staging method: AJCC 7th Edition   Pathologic: Stage 0 (Tis (DCIS), NX, cM0)   Summary: Stage 0 (Tis (DCIS), NX, cM0)  Current Dose: 36 Gy     Planned Dose:  64.4 Gy  Narrative . . . . . . . . The patient presents for routine under treatment assessment.                                   The patient is without complaint except for some soreness within the left breast. She hit a table recently has had some discomfort since then.  Patient was seen last week by her primary care physician and will have an adjustment of her hypertension medication                                 Set-up films were reviewed.                                 The chart was checked. Physical Findings. . .  height is 5\' 6"  (1.676 m) and weight is 310 lb 11.2 oz (140.933 kg). Her oral temperature is 98.1 F (36.7 C). Her blood pressure is 191/91 and her pulse is 54. . The left breast area shows some erythema and hyperpigmentation changes but no skin breakdown is appreciated.  Impression . . . . . . . The patient is tolerating radiation. Plan . . . . . . . . . . . . Continue treatment as planned.  ________________________________   Blair Promise, PhD, MD

## 2014-04-04 NOTE — Progress Notes (Signed)
Tamara [name] has had 20 fractions to her left breast.  She reports having shooting pains in her left breast that started after she was bumped on the table yesterday after treatment.  She reports feeling fatigued.  The skin on her left breast is red.  She has a peeling area underneath her breast.  She is using radiaples.  Her bp was elevated today at 191/91.  She saw Dr. Sherren Mocha, her pcp, last Thursday.

## 2014-04-04 NOTE — Progress Notes (Signed)
  Radiation Oncology         [id number]) [phone] ________________________________  Name: Tamara [name] MRN: [id number]  Date: 04/04/2014  DOB: 1951/01/27  Simulation note   Earlier today the patient underwent additional planning for radiation therapy directed at the left breast area. Patient's treatment planning CT scan was reviewed and she had set up of a boost field directed at the lumpectomy cavity. Given the depth within the breast the patient will be treated with a photon boost. She had development of 3 custom-shaped fields and a computerized isodose plan generated. Patient will receive 7 additional treatments at 2 gray per fraction for a boost dose of 14 gray and a cumulative dose to the lumpectomy cavity 64.4 gray.   Blair Promise, PhD, MD

## 2014-04-05 ENCOUNTER — Ambulatory Visit
Admission: RE | Admit: 2014-04-05 | Discharge: 2014-04-05 | Disposition: A | Discharge status: Home / Self Care | Payer: BC Managed Care – PPO | Source: Ambulatory Visit | Attending: Radiation Oncology | Admitting: Radiation Oncology

## 2014-04-06 ENCOUNTER — Other Ambulatory Visit (HOSPITAL_BASED_OUTPATIENT_CLINIC_OR_DEPARTMENT_OTHER): Payer: BC Managed Care – PPO

## 2014-04-06 ENCOUNTER — Ambulatory Visit
Admission: RE | Admit: 2014-04-06 | Discharge: 2014-04-06 | Disposition: A | Discharge status: Home / Self Care | Payer: BC Managed Care – PPO | Source: Ambulatory Visit | Attending: Radiation Oncology | Admitting: Radiation Oncology

## 2014-04-06 ENCOUNTER — Ambulatory Visit: Payer: BC Managed Care – PPO

## 2014-04-06 ENCOUNTER — Ambulatory Visit (HOSPITAL_BASED_OUTPATIENT_CLINIC_OR_DEPARTMENT_OTHER): Payer: BC Managed Care – PPO | Admitting: Oncology

## 2014-04-06 VITALS — BP 202/83 | HR 60 | Temp 98.1°F | Resp 20 | Ht 66.0 in | Wt 304.0 lb

## 2014-04-06 DIAGNOSIS — C50412 Malignant neoplasm of upper-outer quadrant of left female breast: Secondary | ICD-10-CM

## 2014-04-06 DIAGNOSIS — D0590 Unspecified type of carcinoma in situ of unspecified breast: Secondary | ICD-10-CM

## 2014-04-06 DIAGNOSIS — Z170 Estrogen receptor positive status [ER+]: Secondary | ICD-10-CM

## 2014-04-06 DIAGNOSIS — D0510 Intraductal carcinoma in situ of unspecified breast: Secondary | ICD-10-CM

## 2014-04-06 LAB — COMPREHENSIVE METABOLIC PANEL (CC13)
ALT: 29 U/L (ref 0–55)
ANION GAP: 12 meq/L — AB (ref 3–11)
AST: 28 U/L (ref 5–34)
Albumin: 3.7 g/dL (ref 3.5–5.0)
Alkaline Phosphatase: 61 U/L (ref 40–150)
BUN: 16.4 mg/dL (ref 7.0–26.0)
CALCIUM: 9.5 mg/dL (ref 8.4–10.4)
CHLORIDE: 110 meq/L — AB (ref 98–109)
CO2: 23 meq/L (ref 22–29)
Creatinine: 0.9 mg/dL (ref 0.6–1.1)
Glucose: 127 mg/dl (ref 70–140)
Potassium: 4.4 mEq/L (ref 3.5–5.1)
SODIUM: 145 meq/L (ref 136–145)
TOTAL PROTEIN: 7.3 g/dL (ref 6.4–8.3)
Total Bilirubin: 0.43 mg/dL (ref 0.20–1.20)

## 2014-04-06 LAB — CBC WITH DIFFERENTIAL/PLATELET
BASO%: 0.9 % (ref 0.0–2.0)
Basophils Absolute: 0.1 10*3/uL (ref 0.0–0.1)
EOS%: 2.4 % (ref 0.0–7.0)
Eosinophils Absolute: 0.1 10*3/uL (ref 0.0–0.5)
HEMATOCRIT: 42 % (ref 34.8–46.6)
HGB: 14 g/dL (ref 11.6–15.9)
LYMPH%: 15.2 % (ref 14.0–49.7)
MCH: 28.5 pg (ref 25.1–34.0)
MCHC: 33.5 g/dL (ref 31.5–36.0)
MCV: 85.1 fL (ref 79.5–101.0)
MONO#: 0.7 10*3/uL (ref 0.1–0.9)
MONO%: 11.6 % (ref 0.0–14.0)
NEUT#: 4.2 10*3/uL (ref 1.5–6.5)
NEUT%: 69.9 % (ref 38.4–76.8)
Platelets: 156 10*3/uL (ref 145–400)
RBC: 4.93 10*6/uL (ref 3.70–5.45)
RDW: 14.2 % (ref 11.2–14.5)
WBC: 6 10*3/uL (ref 3.9–10.3)
lymph#: 0.9 10*3/uL (ref 0.9–3.3)

## 2014-04-06 NOTE — Progress Notes (Signed)
[facility]  Telephone:([phone]) [phone] Fax:(336) [phone]     ID: Tamara [name] OB: 03-22-1951  MR#: [id number]  [id number]  PCP: Tamara Man, MD GYN:   SU: Tamara [name] OTHER MD: Tamara [name]  CHIEF COMPLAINT: "I'm getting radiation for breast cancer"  BREAST CANCER HISTORY: Tamara [name] had routine screening mammography at the breast Center 10/03/2013 showing suspicious calcifications in the left breast. Additional views 10/25/2013 confirmed a 2.6 area of pleomorphic calcification in the subareolar left breast. There was no associated mass. Needle core biopsy could not be performed for body habitus reasons. Excisional biopsy of this area 01/24/2014 showed ([id number]) ductal carcinoma in situ, high-grade, estrogen receptor 91% positive with strong staining intensity, progesterone receptor negative. There were multiple margins that were "very close" (less than half a millimeter).  Accordingly on 02/02/2014 the patient underwent further left breast excision. Margins remained close, but now are clear. The patient had an uneventful postoperative course and t proceeded to adjuvant radiation, which will be completed late May 2015.  Her subsequent history is as detailed below.  INTERVAL HISTORY: Tamara [name] was evaluated in the breast cancer clinic may 06/19/2014  REVIEW OF SYSTEMS: There were no specific symptoms leading to the original mammogram, which was routinely scheduled. The patient denies unusual headaches, visual changes, nausea, vomiting, stiff neck, dizziness, or gait imbalance. There has been no cough, phlegm production, or pleurisy, no chest pain or pressure, and no change in bowel or bladder habits. The patient denies fever, rash, bleeding, unexplained fatigue or unexplained weight loss. She is tolerating the radiation well, without unusual fatigue and only mild erythema so far. Tamara [name] does not exercise regularly. She complains of knee problems. A detailed review  of systems was otherwise entirely negative.  PAST MEDICAL HISTORY: Past Medical History  Diagnosis Date  . Obese   . Cellulitis and abscess of leg   . Wears glasses   . Hearing loss   . Hyperlipidemia     takes Zocor nightly  . Hypertension     takes Coreg and Losartan daily  . Asthma     rare;only when around alot of dust-Ventolin inhaler as needed  . History of bronchitis 1966  . Sleep apnea, obstructive     uses CPAP  . Arthritis     knees   . Joint pain   . Joint swelling   . Peripheral edema     takes Furosemide daily as needed and Potassium daily  . Depression     takes Paxil daily  . Complication of anesthesia     was told 01/06/14 that airway was small  . Restless legs syndrome     takes depakote  . Breast cancer     left    PAST SURGICAL HISTORY: Past Surgical History  Procedure Laterality Date  . Tubal ligation    . Inner ear surgery Bilateral     for hearing loss  . Dilatation & curettage/hysteroscopy with trueclear N/A 01/06/2014    Procedure: DILATATION & CURETTAGE/HYSTEROSCOPY WITH TRUCLEAR;  Surgeon: Tamara Millet II, MD;  Location: [facility];  Service: Gynecology;  Laterality: N/A;  . Joint replacement Right     Knee  . Colonoscopy    . Breast lumpectomy with needle localization Left 01/24/2014    Procedure: BREAST LUMPECTOMY WITH NEEDLE LOCALIZATION;  Surgeon: Tamara Jolly, MD;  Location: [facility];  Service: General;  Laterality: Left;  . Re-excision of breast lumpectomy Left 02/02/2014    Procedure: RE-EXCISION OF LEFT BREAST LUMPECTOMY;  Surgeon: Tamara Jolly, MD;  [facility]: [facility];  Service: General;  Laterality: Left;    FAMILY HISTORY Family History  Problem Relation Age of Onset  . Dementia Mother   . Asthma Other   . Hypertension Other   . Thyroid disease Other   . Heart attack Other   . Esophageal cancer Father     also had stomach cancer  . Throat cancer Paternal Grandfather    the patient's father died in his late 12s from  throat and stomach cancer. The patient's mother died in her early 48s with congestive heart failure and dementia. The patient had 2 brothers, no sisters. There is no history of breast or ovarian cancer in the family  GYNECOLOGIC HISTORY:   Menarche age 23, first live birth age 45, she is Tamara [name]. She went through menopause more than 15 years ago. She did not take hormone replacement. 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 She is divorced and lives by herself, with no pets. Son Tamara [name] works as an Clinical biochemist for [employer]. Son Tamara [name] works as a Printmaker for the same company. Daughter Tamara [name] is an Scientist, physiological for this could feel that the patient has 1 grandchild and one on the way with 4 stepgrandchildren    ADVANCED DIRECTIVES:  in place. The patient has named her son Tamara [name] as her healthcare power of attorney. He can be reached at [phone]   HEALTH MAINTENANCE: History  Substance Use Topics  . Smoking status: Never Smoker   . Smokeless tobacco: Not on file  . Alcohol Use: No     Colonoscopy: 2008/[facility]   PAP: 2014   [facility]   Lipid panel:  No Known Allergies  Current Outpatient Prescriptions  Medication Sig Dispense Refill  . albuterol (PROVENTIL HFA;VENTOLIN HFA) 108 (90 BASE) MCG/ACT inhaler Inhale 2 puffs into the lungs every 6 (six) hours as needed for wheezing or shortness of breath.      Marland Kitchen aspirin 81 MG tablet Take 81 mg by mouth daily.        . carvedilol (COREG) 25 MG tablet 2 in the morning one at bedtime  300 tablet  3  . diphenhydramine-acetaminophen (TYLENOL PM) 25-500 MG TABS Take 2 tablets by mouth at bedtime as needed (sleep).      . divalproex (DEPAKOTE ER) 250 MG 24 hr tablet Take 250 mg by mouth daily.      . DUAC gel Apply 1 application topically as needed (rash).       . fluconazole (DIFLUCAN) 150 MG tablet  Take 150 mg by mouth every other day. Only as needed for yeast      . fluticasone (CUTIVATE) 0.05 % cream Apply 1 application topically as needed (rash).       . furosemide (LASIX) 20 MG tablet Take 20 mg by mouth daily. Takes 40 mg in the am & 20 mg at noon      . furosemide (LASIX) 40 MG tablet Take 40 mg by mouth daily. Takes 40 mg in the am & 20 mg at noon      . ketoconazole (NIZORAL) 2 % cream Apply 1 application topically daily as needed (rash).       . LORazepam (ATIVAN) 1 MG tablet 1 by mouth twice a day when necessary  60 tablet  3  . losartan (COZAAR) 100 MG tablet One  twice daily  200 tablet  3  . potassium chloride SA (K-DUR,KLOR-CON) 20 MEQ tablet Take 20 mEq by mouth daily.      . simvastatin (ZOCOR) 20 MG tablet Take 20 mg by mouth every evening.      . traMADol (ULTRAM) 50 MG tablet Take 50 mg by mouth every 8 (eight) hours as needed for moderate pain.       No current facility-administered medications for this visit.    OBJECTIVE: MIDDLE-AGED WHITE WOMAN IN NO ACUTE DISTRESS  Filed Vitals:   04/06/14 1654  BP: 202/83  Pulse: 60  Temp: 98.1 F (36.7 C)  Resp: 20     Body mass index is 49.09 kg/(m^2).    ECOG FS:1 - Symptomatic but completely ambulatory  Ocular: Sclerae unicteric, pupils equal, round and reactive to light Ear-nose-throat: Oropharynx clear and moist  Lymphatic: No cervical or supraclavicular adenopathy Lungs no rales or rhonchi, good excursion bilaterally Heart regular rate and rhythm, no murmur appreciated Abd soft,  obese,nontender, positive bowel sounds MSK no focal spinal tenderness, no joint edema Neuro: non-focal, well-oriented, appropriate affect Breasts:  the right breast is unremarkable. Left breast is status post lumpectomy and is currently receiving radiation. There is erythema, more marked in the left axilla and the left inframammary fold, but no desquamation so far. There are no suspicious masses or nipple changes. The left axilla is  otherwise benign.   LAB RESULTS:  CMP     Component Value Date/Time   NA 145 04/06/2014 1602   NA 139 02/02/2014 0742   K 4.4 04/06/2014 1602   K 4.4 02/02/2014 0742   CL 99 02/02/2014 0742   CO2 23 04/06/2014 1602   CO2 22 02/02/2014 0742   GLUCOSE 127 04/06/2014 1602   GLUCOSE 96 02/02/2014 0742   BUN 16.4 04/06/2014 1602   BUN 23 02/02/2014 0742   CREATININE 0.9 04/06/2014 1602   CREATININE 0.77 02/02/2014 0742   CREATININE 0.78 08/02/2011 0834   CALCIUM 9.5 04/06/2014 1602   CALCIUM 9.9 02/02/2014 0742   PROT 7.3 04/06/2014 1602   PROT 7.3 09/21/2013 0853   ALBUMIN 3.7 04/06/2014 1602   ALBUMIN 3.7 09/21/2013 0853   AST 28 04/06/2014 1602   AST 22 09/21/2013 0853   ALT 29 04/06/2014 1602   ALT 22 09/21/2013 0853   ALKPHOS 61 04/06/2014 1602   ALKPHOS 54 09/21/2013 0853   BILITOT 0.43 04/06/2014 1602   BILITOT 0.5 09/21/2013 0853   GFRNONAA 88* 02/02/2014 0742   GFRAA >90 02/02/2014 0742    I No results found for this basename: SPEP, UPEP,  kappa and lambda light chains    Lab Results  Component Value Date   WBC 6.0 04/06/2014   NEUTROABS 4.2 04/06/2014   HGB 14.0 04/06/2014   HCT 42.0 04/06/2014   MCV 85.1 04/06/2014   PLT 156 04/06/2014      Chemistry      Component Value Date/Time   NA 145 04/06/2014 1602   NA 139 02/02/2014 0742   K 4.4 04/06/2014 1602   K 4.4 02/02/2014 0742   CL 99 02/02/2014 0742   CO2 23 04/06/2014 1602   CO2 22 02/02/2014 0742   BUN 16.4 04/06/2014 1602   BUN 23 02/02/2014 0742   CREATININE 0.9 04/06/2014 1602   CREATININE 0.77 02/02/2014 0742   CREATININE 0.78 08/02/2011 0834      Component Value Date/Time   CALCIUM 9.5 04/06/2014 1602   CALCIUM 9.9 02/02/2014 0742  ALKPHOS 61 04/06/2014 1602   ALKPHOS 54 09/21/2013 0853   AST 28 04/06/2014 1602   AST 22 09/21/2013 0853   ALT 29 04/06/2014 1602   ALT 22 09/21/2013 0853   BILITOT 0.43 04/06/2014 1602   BILITOT 0.5 09/21/2013 0853       No results found for this basename: LABCA2    No components found with this basename: [id number]    No  results found for this basename: INR,  in the last 168 hours  Urinalysis    Component Value Date/Time   COLORURINE yellow 03/19/2010 0815   APPEARANCEUR Clear 03/19/2010 0815   LABSPEC 1.015 03/19/2010 0815   PHURINE 7.0 03/19/2010 0815   GLUCOSEU NEGATIVE 11/14/2009 1155   HGBUR negative 03/19/2010 0815   HGBUR NEGATIVE 11/14/2009 1155   BILIRUBINUR n 09/21/2013 1108   BILIRUBINUR negative 03/19/2010 0815   KETONESUR NEGATIVE 11/14/2009 1155   PROTEINUR n 09/21/2013 1108   PROTEINUR NEGATIVE 11/14/2009 1155   UROBILINOGEN 0.2 09/21/2013 1108   UROBILINOGEN 0.2 03/19/2010 0815   NITRITE n 09/21/2013 1108   NITRITE negative 03/19/2010 0815   LEUKOCYTESUR Negative 09/21/2013 1108    STUDIES: outside films reviewed   ASSESSMENT: 63 y.o. status post left lumpectomy 01/24/2014 for ductal carcinoma in situ, high-grade, estrogen receptor 91% positive, progesterone receptor negative, with positive margins  (1) status post further left breast excision for margin clearance 02/02/2014, achieving close but negative margins.  (2) undergoing radiation therapy to the left breast to be completed 04/26/2014  PLAN: We spent the better part of today's hour-long appointment discussing the biology of breast cancer in general, and the specifics of the patient's tumor in particular. Tamara [name] understands noninvasive breast cancer means the cancer cells were trapped in the ducts where they started. They cannot travel to vital organ. Accordingly they cannot threaten her life. This is not in itself a life-threatening cancer.  I concurred with her decision to keep her breast. That means there is a small chance the cancer could recur in the breast, which is the only place that could recur. She is taking radiation to reduce that risk and generally radiation cuts the risk of bit more than half, so if the risk of local recurrence was 30% radiation would bring it down to about 12%.  If she in addition to antiestrogen  pills that would cut the risk of recurrence in half. He would go safe from 12% to 6% or from 10% to 5%. This is obviously a marginal benefits, although it is certainly measurable.  And more compelling reason, I think, to consider antiestrogen as is the risk of developing another breast cancer in either breast. That risk is in the range of 1% per year. Anti-estrogens cut that risk also in half. If she lives another 20 years, then the risk of developing another breast cancer would drop from 20-10%.  We then discussed the possible toxicities, side effects and complications of anti-estrogens. I think tamoxifen would not be a good choice for her because of concerns regarding clots and endometrial hyperplasia. These are not concerns with anastrozole. Accordingly the plan will be for her to start anastrozole when she completes her radiation treatments. She will see me in the second-half of August. If she is tolerating the anastrozole well the plan will be to continue that for total of 5 years and we will do yearly visits here. We will also obtain a bone density at that time.  If she does not tolerate anastrozole well then she  will simply stop it and we will follow her off treatment.  Tamara [name] is a good understanding of the overall plan. She agrees with it. She knows a goal of treatment in her cases cure. She will call with any problems that may develop before next visit here (and she has promised me a "tale from the [place] court house" at the next visit".   Chauncey Cruel, MD   04/06/2014 5:12 PM

## 2014-04-07 ENCOUNTER — Ambulatory Visit
Admission: RE | Admit: 2014-04-07 | Discharge: 2014-04-07 | Disposition: A | Discharge status: Home / Self Care | Payer: BC Managed Care – PPO | Source: Ambulatory Visit | Attending: Radiation Oncology | Admitting: Radiation Oncology

## 2014-04-08 ENCOUNTER — Telehealth: Payer: Self-pay | Admitting: Oncology

## 2014-04-08 NOTE — Telephone Encounter (Signed)
, °

## 2014-04-10 ENCOUNTER — Ambulatory Visit
Admission: RE | Admit: 2014-04-10 | Discharge: 2014-04-10 | Disposition: A | Discharge status: Home / Self Care | Payer: BC Managed Care – PPO | Source: Ambulatory Visit | Attending: Radiation Oncology | Admitting: Radiation Oncology

## 2014-04-11 ENCOUNTER — Ambulatory Visit
Admission: RE | Admit: 2014-04-11 | Discharge: 2014-04-11 | Disposition: A | Discharge status: Home / Self Care | Payer: BC Managed Care – PPO | Source: Ambulatory Visit | Attending: Radiation Oncology | Admitting: Radiation Oncology

## 2014-04-11 VITALS — BP 185/85 | HR 58 | Temp 98.0°F | Ht 66.0 in | Wt 309.5 lb

## 2014-04-11 DIAGNOSIS — C50412 Malignant neoplasm of upper-outer quadrant of left female breast: Secondary | ICD-10-CM

## 2014-04-11 NOTE — Progress Notes (Signed)
Tamara [name] has had 25 fractions to her left breast.  She denies pain.  Her bp is elevated today at 181/85.  She reports fatigue.  She has a red, raised rash on her left chest.  Advised her that she can use hydrocortisone cream.  She reports that it itching.  The skin on her left breast and underarm is red.  She has an area of desquamation underneath her left breast.  She is using radiaplex gel.

## 2014-04-11 NOTE — Progress Notes (Signed)
  Radiation Oncology         [phone]) [phone] ________________________________  Name: Tamara [name] MRN: [id number]  Date: 04/11/2014  DOB: 1951-04-17  Weekly Radiation Therapy Management  DCIS (ductal carcinoma in situ) of breast   Primary site: Breast (Left)   Staging method: AJCC 7th Edition   Pathologic: Stage 0 (Tis (DCIS), NX, cM0)   Summary: Stage 0 (Tis (DCIS), NX, cM0)  Current Dose: 45 Gy     Planned Dose:  64.4 Gy  Narrative . . . . . . . . The patient presents for routine under treatment assessment.                                   The patient is without complaint is some fatigue and some discomfort within the breast area. She did need with medical oncology once start hormonal therapy after she completes her radiation therapy                                 Set-up films were reviewed.                                 The chart was checked. Physical Findings. . .  height is 5\' 6"  (1.676 m) and weight is 309 lb 8 oz (140.388 kg). Her oral temperature is 98 F (36.7 C). Her blood pressure is 185/85 and her pulse is 58. . The left breast area shows erythema. Inframammary fold shows some dry desquamation but no significant moist desquamation Impression . . . . . . . The patient is tolerating radiation. Plan . . . . . . . . . . . . Continue treatment as planned.  ________________________________   Blair Promise, PhD, MD

## 2014-04-12 ENCOUNTER — Ambulatory Visit
Admission: RE | Admit: 2014-04-12 | Discharge: 2014-04-12 | Disposition: A | Discharge status: Home / Self Care | Payer: BC Managed Care – PPO | Source: Ambulatory Visit | Attending: Radiation Oncology | Admitting: Radiation Oncology

## 2014-04-13 ENCOUNTER — Ambulatory Visit
Admission: RE | Admit: 2014-04-13 | Discharge: 2014-04-13 | Disposition: A | Discharge status: Home / Self Care | Payer: BC Managed Care – PPO | Source: Ambulatory Visit | Attending: Radiation Oncology | Admitting: Radiation Oncology

## 2014-04-14 ENCOUNTER — Encounter: Payer: Self-pay | Admitting: Neurology

## 2014-04-14 ENCOUNTER — Ambulatory Visit
Admission: RE | Admit: 2014-04-14 | Discharge: 2014-04-14 | Disposition: A | Discharge status: Home / Self Care | Payer: BC Managed Care – PPO | Source: Ambulatory Visit | Attending: Radiation Oncology | Admitting: Radiation Oncology

## 2014-04-17 ENCOUNTER — Ambulatory Visit
Admission: RE | Admit: 2014-04-17 | Discharge: 2014-04-17 | Disposition: A | Discharge status: Home / Self Care | Payer: BC Managed Care – PPO | Source: Ambulatory Visit | Attending: Radiation Oncology | Admitting: Radiation Oncology

## 2014-04-18 ENCOUNTER — Ambulatory Visit
Admission: RE | Admit: 2014-04-18 | Discharge: 2014-04-18 | Disposition: A | Discharge status: Home / Self Care | Payer: BC Managed Care – PPO | Source: Ambulatory Visit | Attending: Radiation Oncology | Admitting: Radiation Oncology

## 2014-04-18 ENCOUNTER — Encounter: Payer: Self-pay | Admitting: Radiation Oncology

## 2014-04-18 VITALS — BP 188/92 | HR 54 | Temp 98.5°F | Ht 66.0 in | Wt 305.1 lb

## 2014-04-18 DIAGNOSIS — C50412 Malignant neoplasm of upper-outer quadrant of left female breast: Secondary | ICD-10-CM

## 2014-04-18 NOTE — Progress Notes (Signed)
  Radiation Oncology         [phone] ________________________________  Name: LEATTA ALEWINE MRN: [id number]  Date: 04/18/2014  DOB: [birth year]  Weekly Radiation Therapy Management  Current Dose: 54.4 Gy     Planned Dose:  64.4 Gy  Narrative . . . . . . . . The patient presents for routine under treatment assessment.                                   The patient is without complaint. She does have some fatigue but this is manageable.                                 Set-up films were reviewed.                                 The chart was checked. Physical Findings. . .  height is 5\' 6"  (1.676 m) and weight is 305 lb 1.6 oz (138.392 kg). Her temperature is 98.5 F (36.9 C). Her blood pressure is 188/92 and her pulse is 54. . The lungs are clear. The heart has regular rhythm and rate. The left breast area shows erythema and some dry desquamation in the inframammary fold but no moist desquamation. Impression . . . . . . . The patient is tolerating radiation. Plan . . . . . . . . . . . . Continue treatment as planned.  ________________________________   Blair Promise, PhD, MD

## 2014-04-18 NOTE — Progress Notes (Signed)
Tamara [name] has had 30 fractions to her left breast.  She denies pain.  She reports constipation.  Advised her to take a stool softener like colace.  She reports fatigue.  The skin on her left breast is red.  She has peeling/desquamation underneath her left breast.  She is using radiaplex gel and her antifungal gel underneath her breast.

## 2014-04-18 NOTE — Progress Notes (Signed)
  Radiation Oncology         [phone]) [phone] ________________________________  Name: Tamara [name] MRN: [id number]  Date: 04/18/2014  DOB: 1951-03-30  Simulation Verification Note- performed on May 18  Status: outpatient  NARRATIVE: The patient was brought to the treatment unit and placed in the planned treatment position. The clinical setup was verified. Then port films were obtained and uploaded to the radiation oncology medical record software.  The treatment beams were carefully compared against the planned radiation fields. The position location and shape of the radiation fields was reviewed. They targeted volume of tissue appears to be appropriately covered by the radiation beams. Organs at risk appear to be excluded as planned.  Based on my personal review, I approved the simulation verification. The patient's treatment will proceed as planned.  -----------------------------------  Blair Promise, PhD, MD

## 2014-04-19 ENCOUNTER — Telehealth: Payer: Self-pay | Admitting: *Deleted

## 2014-04-19 ENCOUNTER — Ambulatory Visit
Admission: RE | Admit: 2014-04-19 | Discharge: 2014-04-19 | Disposition: A | Discharge status: Home / Self Care | Payer: BC Managed Care – PPO | Source: Ambulatory Visit | Attending: Radiation Oncology | Admitting: Radiation Oncology

## 2014-04-19 MED ORDER — ANASTROZOLE 1 MG PO TABS: 1.0000 mg | ORAL_TABLET | Freq: Every day | ORAL | Status: DC

## 2014-04-19 NOTE — Telephone Encounter (Signed)
Patient calling stating she was finishing RT and needs a sooner appt than 08/2014. According to plan, patient is to finish RT, take a break and start Anastrozole around 05/01/2014. That will give patient time to have had the medicine and its full side effects to evaluate if this medication is appropriate for her. Called and left detailed message to call us back if she needs further clarification. Rx sent to pharmacy.

## 2014-04-20 ENCOUNTER — Ambulatory Visit
Admission: RE | Admit: 2014-04-20 | Discharge: 2014-04-20 | Disposition: A | Discharge status: Home / Self Care | Payer: BC Managed Care – PPO | Source: Ambulatory Visit | Attending: Radiation Oncology | Admitting: Radiation Oncology

## 2014-04-21 ENCOUNTER — Ambulatory Visit
Admission: RE | Admit: 2014-04-21 | Discharge: 2014-04-21 | Disposition: A | Discharge status: Home / Self Care | Payer: BC Managed Care – PPO | Source: Ambulatory Visit | Attending: Radiation Oncology | Admitting: Radiation Oncology

## 2014-04-25 ENCOUNTER — Ambulatory Visit
Admission: RE | Admit: 2014-04-25 | Discharge: 2014-04-25 | Disposition: A | Discharge status: Home / Self Care | Payer: BC Managed Care – PPO | Source: Ambulatory Visit | Attending: Radiation Oncology | Admitting: Radiation Oncology

## 2014-04-25 VITALS — BP 182/97 | HR 56 | Temp 98.2°F | Ht 66.0 in | Wt 298.9 lb

## 2014-04-25 DIAGNOSIS — C50412 Malignant neoplasm of upper-outer quadrant of left female breast: Secondary | ICD-10-CM

## 2014-04-25 NOTE — Progress Notes (Signed)
Tamara [name] has had 34 fractions to her left breast.  She denies pain except for occasional twinges of pain in her left breast.  She reports fatigue.  Her bp was elevated today at 182/97.  She has an apt to see her PCP on Thursday.  She has an area of dequamation uinderneath her left arm.  The skin on her right breast is red.  She is using radiaplex.  She has been given a one month follow up appointment card.

## 2014-04-25 NOTE — Progress Notes (Signed)
  Radiation Oncology         [phone]) [phone] ________________________________  Name: Tamara [name] MRN: [id number]  Date: 04/25/2014  DOB: 05/23/1951  Weekly Radiation Therapy Management  DCIS (ductal carcinoma in situ) of breast   Primary site: Breast (Left)   Staging method: AJCC 7th Edition   Pathologic: Stage 0 (Tis (DCIS), NX, cM0)   Summary: Stage 0 (Tis (DCIS), NX, cM0)   Current Dose: 62.4 Gy     Planned Dose:  64.4 Gy  Narrative . . . . . . . . The patient presents for routine under treatment assessment.                                   The patient is without complaint except for some fatigue. She has occasional discomfort in the left breast but nothing consistent. She is scheduled to see her primary care physician Thursday concerning her elevated blood pressure                                 Set-up films were reviewed.                                 The chart was checked. Physical Findings. . .  height is 5\' 6"  (1.676 m) and weight is 298 lb 14.4 oz (135.58 kg). Her oral temperature is 98.2 F (36.8 C). Her blood pressure is 182/97 and her pulse is 56. . Erythema is noted in the left breast with a patches of dry desquamation but no significant moist desquamation. Impression . . . . . . . The patient is tolerating radiation. Plan . . . . . . . . . . . . Continue treatment as planned.  ________________________________   Blair Promise, PhD, MD

## 2014-04-26 ENCOUNTER — Ambulatory Visit
Admission: RE | Admit: 2014-04-26 | Discharge: 2014-04-26 | Disposition: A | Discharge status: Home / Self Care | Payer: BC Managed Care – PPO | Source: Ambulatory Visit | Attending: Radiation Oncology | Admitting: Radiation Oncology

## 2014-04-27 ENCOUNTER — Other Ambulatory Visit: Payer: Self-pay | Admitting: Family Medicine

## 2014-04-27 ENCOUNTER — Ambulatory Visit (INDEPENDENT_AMBULATORY_CARE_PROVIDER_SITE_OTHER): Payer: BC Managed Care – PPO | Admitting: Family Medicine

## 2014-04-27 ENCOUNTER — Encounter: Payer: Self-pay | Admitting: Family Medicine

## 2014-04-27 VITALS — BP 130/90

## 2014-04-27 DIAGNOSIS — I10 Essential (primary) hypertension: Secondary | ICD-10-CM

## 2014-04-27 NOTE — Progress Notes (Signed)
   Subjective:    Patient ID: Tamara [name], female    DOB: [birth year], 63 y.o.   MRN: [id number]  HPI Tamara [name] is a 63 year old female who comes in today for followup of hypertension  Her BP today is 130/90 on Lasix 40 mg in the morning 20 mg at noon and Cozaar 100 mg daily with one 20 mEq potassium supplement  She just finished her last radiation treatment for breast cancer   Review of Systems Review of systems otherwise negative    Objective:   Physical Exam  Well-developed and nourished female no acute distress vital signs stable she's afebrile BP 130/90 right arm sitting position      Assessment & Plan:  Hypertension at goal continue current therapy followup in October CPX

## 2014-04-27 NOTE — Patient Instructions (Signed)
Continue current medications  Followup in October for your annual physical examination  The cream I recommend is creme  Kiehl,s soy milk honey whipped body butter

## 2014-04-27 NOTE — Progress Notes (Signed)
Pre visit review using our clinic review tool, if applicable. No additional management support is needed unless otherwise documented below in the visit note. 

## 2014-05-01 ENCOUNTER — Encounter: Payer: Self-pay | Admitting: Radiation Oncology

## 2014-05-01 NOTE — Progress Notes (Signed)
  Radiation Oncology         [phone] ________________________________  Name: Tamara [name] MRN: [id number]  Date: 05/01/2014  DOB: [birth year]  End of Treatment Note  Diagnosis:    High-grade intraductal carcinoma of the left breast   Indication for treatment:  Breast conservation therapy       Radiation treatment dates:   April 8 through May 27  Site/dose:   Left breast 50.4 gray in 28 fractions; the lumpectomy cavity was boosted an additional 14 gray in 7 fractions for a cumulative dose to the lumpectomy cavity as 64.4 gray  Beams/energy:   Initial setup tangential beams encompassing the left breast; boost field three-field photon boost,  6 and 10x  beams  Narrative: The patient tolerated radiation treatment relatively well.   She experienced some fatigue as well as some itching and discomfort in the breast area but no real problems with moist desquamation.  Plan: The patient has completed radiation treatment. The patient will return to radiation oncology clinic for routine followup in one month. I advised them to call or return sooner if they have any questions or concerns related to their recovery or treatment.  -----------------------------------  Blair Promise, PhD, MD

## 2014-05-04 ENCOUNTER — Ambulatory Visit: Payer: BC Managed Care – PPO | Admitting: Neurology

## 2014-05-25 ENCOUNTER — Encounter: Payer: Self-pay | Admitting: Oncology

## 2014-05-25 ENCOUNTER — Telehealth: Payer: Self-pay | Admitting: *Deleted

## 2014-05-25 DIAGNOSIS — C50412 Malignant neoplasm of upper-outer quadrant of left female breast: Secondary | ICD-10-CM

## 2014-05-25 MED ORDER — VENLAFAXINE HCL ER 37.5 MG PO CP24: 37.5000 mg | ORAL_CAPSULE | Freq: Every day | ORAL | Status: DC

## 2014-05-25 NOTE — Telephone Encounter (Signed)
Called patient's work number. Awaiting return call.  Verbal order received and read back from Micah Flesher for Venlafaxine XR 37.5 mg tablets daily for hot flashes.  Ensure she is not taking any other antidepressants and instruct not to stop abruptly but taper off.  Patient denies any thing else for depression other than what is currently on the medication list.  Order sent [employer].

## 2014-05-26 ENCOUNTER — Encounter: Payer: Self-pay | Admitting: Oncology

## 2014-05-31 ENCOUNTER — Encounter: Payer: Self-pay | Admitting: Radiation Oncology

## 2014-05-31 ENCOUNTER — Ambulatory Visit
Admission: RE | Admit: 2014-05-31 | Discharge: 2014-05-31 | Disposition: A | Discharge status: Home / Self Care | Payer: BC Managed Care – PPO | Source: Ambulatory Visit | Attending: Radiation Oncology | Admitting: Radiation Oncology

## 2014-05-31 VITALS — BP 172/80 | HR 61 | Temp 98.1°F | Ht 66.0 in | Wt 294.7 lb

## 2014-05-31 DIAGNOSIS — C50412 Malignant neoplasm of upper-outer quadrant of left female breast: Secondary | ICD-10-CM

## 2014-05-31 NOTE — Progress Notes (Signed)
Tamara [name] here for follow up after treatment to her left breast.  She denies pain except for occasional sharp pains in her left breast.  She is taking Arimidex and is experiencing hot flashes in the evenings.  She is taking Effexor 37.5 mg in the mornings.  She is wondering if this can be increased.  She reports fatigue.  The skin on her left breast is intact.

## 2014-05-31 NOTE — Progress Notes (Signed)
Radiation Oncology         [phone] ________________________________  Name: Tamara [name] MRN: [id number]  Date: 05/31/2014  DOB: 08/19/1951  Follow-Up Visit Note  CC: Joycelyn Man, MD  Hoxworth, Darene Lamer, MD  Diagnosis:   High-grade intraductal carcinoma of the left breast  Interval Since Last Radiation:  1  months  Narrative:  The patient returns today for routine follow-up.  She overall is doing well. She occasionally will have sharp shooting pains within the breast.  she has started Arimidex. Patient is noticed more problems with hot flashes but was recently given Effexor for this issue. She denies any nipple discharge or bleeding.                              ALLERGIES:  has No Known Allergies.  Meds: Current Outpatient Prescriptions  Medication Sig Dispense Refill  . albuterol (PROVENTIL HFA;VENTOLIN HFA) 108 (90 BASE) MCG/ACT inhaler Inhale 2 puffs into the lungs every 6 (six) hours as needed for wheezing or shortness of breath.      . anastrozole (ARIMIDEX) 1 MG tablet Take 1 tablet (1 mg total) by mouth daily.  30 tablet  3  . aspirin 81 MG tablet Take 81 mg by mouth daily.        . carvedilol (COREG) 25 MG tablet 2 in the morning one at bedtime  300 tablet  3  . diphenhydramine-acetaminophen (TYLENOL PM) 25-500 MG TABS Take 2 tablets by mouth at bedtime as needed (sleep).      . divalproex (DEPAKOTE ER) 250 MG 24 hr tablet Take 250 mg by mouth daily.      . DUAC gel Apply 1 application topically as needed (rash).       . fluconazole (DIFLUCAN) 150 MG tablet Take 150 mg by mouth every other day. Only as needed for yeast      . fluticasone (CUTIVATE) 0.05 % cream Apply 1 application topically as needed (rash).       . furosemide (LASIX) 20 MG tablet Take 20 mg by mouth daily. Takes 40 mg in the am & 20 mg at noon      . furosemide (LASIX) 40 MG tablet Take 40 mg by mouth daily. Takes 40 mg in the am & 20 mg at noon      . ketoconazole (NIZORAL) 2 % cream Apply 1  application topically daily as needed (rash).       . LORazepam (ATIVAN) 1 MG tablet 1 by mouth twice a day when necessary  60 tablet  3  . losartan (COZAAR) 100 MG tablet One twice daily  200 tablet  3  . potassium chloride SA (K-DUR,KLOR-CON) 20 MEQ tablet Take 20 mEq by mouth daily.      . simvastatin (ZOCOR) 20 MG tablet Take 20 mg by mouth every evening.      . traMADol (ULTRAM) 50 MG tablet TAKE ONE TABLET BY MOUTH EVERY 8 HOURS AS NEEDED FOR PAIN  90 tablet  1  . venlafaxine XR (EFFEXOR-XR) 37.5 MG 24 hr capsule Take 1 capsule (37.5 mg total) by mouth daily with breakfast.  30 capsule  1   No current facility-administered medications for this encounter.    Physical Findings: The patient is in no acute distress. Patient is alert and oriented.  height is 5\' 6"  (1.676 m) and weight is 294 lb 11.2 oz (133.675 kg). Her oral temperature is 98.1 F (36.7 C).  Her blood pressure is 172/80 and her pulse is 61. Marland Kitchen  No palpable supraclavicular or axillary adenopathy. The lungs are clear. The heart has regular rhythm and rate. Examination of the right breast reveals no mass or nipple discharge. Examination of left breast reveals the patient's skin to be well-healed. There is some mild hyperpigmentation changes and mild edema in the breast. No dominant masses appreciated breast nipple discharge or bleeding.  Lab Findings: Lab Results  Component Value Date   WBC 6.0 04/06/2014   HGB 14.0 04/06/2014   HCT 42.0 04/06/2014   MCV 85.1 04/06/2014   PLT 156 04/06/2014     Radiographic Findings: No results found.  Impression:  The patient is recovering from the effects of radiation.  No evidence of recurrence on clinical exam today  Plan:  When necessary followup in radiation oncology. She will continue close followup in medical oncology with adjuvant hormonal therapy.  ____________________________________ Blair Promise, MD

## 2014-06-28 ENCOUNTER — Telehealth: Payer: Self-pay | Admitting: *Deleted

## 2014-06-28 DIAGNOSIS — C50412 Malignant neoplasm of upper-outer quadrant of left female breast: Secondary | ICD-10-CM

## 2014-06-28 MED ORDER — VENLAFAXINE HCL ER 37.5 MG PO CP24: ORAL_CAPSULE | ORAL | Status: DC

## 2014-06-28 NOTE — Telephone Encounter (Signed)
Pt left message stating she was prescribed a medication for hot flashes " not working good and am wondering if I can increase the medication "  Return call number given as [phone].  This RN reviewed chart and noted pt was prescribed effexer XR 37.5 mg.  This RN returned call to pt and informed her above medication can be increased to 2 tabs a day.  New prescription of above with increased dispense amounts will be sent to pharmacy-   Pt is to call in 2 weeks with update of benefit for additional prescriptions.  Sabirin verbalized understanding.

## 2014-07-04 ENCOUNTER — Telehealth: Payer: Self-pay | Admitting: Oncology

## 2014-07-04 NOTE — Telephone Encounter (Signed)
per outlook email from [facility] 06/30/14 moved pt appt from his schedule to HF. appt time not able to remain the same as the time does not fall within the guidelines of HF template. lmonvm for pt re change w/new time for 9/2. Schedule mailed.

## 2014-07-12 ENCOUNTER — Telehealth: Payer: Self-pay | Admitting: *Deleted

## 2014-07-12 NOTE — Telephone Encounter (Signed)
Spoke with patient.  She reports that increase in dose for "hot flash" medication is helping, but she still has some in the afternoon and early evening.  She takes her medication first thing in the morning at 7am.  Discussed with Charlestine Massed NP:  Try taking dose at lunch time instead of early am. Patient will try this and call us early next week and let us know if this change was helpful.

## 2014-07-19 ENCOUNTER — Telehealth: Payer: Self-pay | Admitting: *Deleted

## 2014-07-19 ENCOUNTER — Other Ambulatory Visit: Payer: Self-pay | Admitting: Oncology

## 2014-07-19 ENCOUNTER — Other Ambulatory Visit: Payer: Self-pay | Admitting: *Deleted

## 2014-07-19 DIAGNOSIS — I10 Essential (primary) hypertension: Secondary | ICD-10-CM

## 2014-07-19 DIAGNOSIS — C50412 Malignant neoplasm of upper-outer quadrant of left female breast: Secondary | ICD-10-CM

## 2014-07-19 DIAGNOSIS — D0510 Intraductal carcinoma in situ of unspecified breast: Secondary | ICD-10-CM

## 2014-07-19 MED ORDER — CLONIDINE HCL 0.1 MG/24HR TD PTWK: 0.1000 mg | MEDICATED_PATCH | TRANSDERMAL | Status: DC

## 2014-07-19 NOTE — Telephone Encounter (Signed)
Per Dr. Jana Hakim, continue Effexor 75 mg/ day. Start Catapres TTS patches every week for headaches. Dose is 0.1 mg/hr. This prescription was e-scribed and message was left on patient's cell phone. Instructed to call this office if she had any questions.

## 2014-07-19 NOTE — Telephone Encounter (Signed)
Received vm call from pt stating that she has been on effexor & it has been increased to 2 tabs/day & she is still having bad hot flashes & H/A.  Note to Dr Jana Hakim.

## 2014-07-28 ENCOUNTER — Other Ambulatory Visit: Payer: Self-pay | Admitting: Family Medicine

## 2014-08-02 ENCOUNTER — Encounter: Payer: Self-pay | Admitting: Nurse Practitioner

## 2014-08-02 ENCOUNTER — Telehealth: Payer: Self-pay | Admitting: Nurse Practitioner

## 2014-08-02 ENCOUNTER — Ambulatory Visit (HOSPITAL_BASED_OUTPATIENT_CLINIC_OR_DEPARTMENT_OTHER): Payer: BC Managed Care – PPO | Admitting: Nurse Practitioner

## 2014-08-02 VITALS — BP 188/90 | HR 62 | Temp 98.3°F | Resp 24 | Ht 66.0 in | Wt 301.1 lb

## 2014-08-02 DIAGNOSIS — K5900 Constipation, unspecified: Secondary | ICD-10-CM | POA: Insufficient documentation

## 2014-08-02 DIAGNOSIS — N951 Menopausal and female climacteric states: Secondary | ICD-10-CM

## 2014-08-02 DIAGNOSIS — Z170 Estrogen receptor positive status [ER+]: Secondary | ICD-10-CM

## 2014-08-02 DIAGNOSIS — D0590 Unspecified type of carcinoma in situ of unspecified breast: Secondary | ICD-10-CM

## 2014-08-02 DIAGNOSIS — R232 Flushing: Secondary | ICD-10-CM | POA: Insufficient documentation

## 2014-08-02 DIAGNOSIS — C50412 Malignant neoplasm of upper-outer quadrant of left female breast: Secondary | ICD-10-CM

## 2014-08-02 DIAGNOSIS — M8580 Other specified disorders of bone density and structure, unspecified site: Secondary | ICD-10-CM

## 2014-08-02 NOTE — Progress Notes (Signed)
Tamara [name]  [phone] [phone]     ID: Tamara [name] OB: 10-18-1951  MR#: [id number]  S[facility]#:[id number]  PCP: Joycelyn Man, MD GYN:   SU: Excell Seltzer OTHER MD: Gery Pray  CHIEF COMPLAINT: left breast cancer CURRENT TREATMENT: anastrozole 1mg  daily  BREAST CANCER HISTORY: Tamara [name] had routine screening mammography at the breast Center 10/03/2013 showing suspicious calcifications in the left breast. Additional views 10/25/2013 confirmed a 2.6 area of pleomorphic calcification in the subareolar left breast. There was no associated mass. Needle core biopsy could not be performed for body habitus reasons. Excisional biopsy of this area 01/24/2014 showed ([id number]) ductal carcinoma in situ, high-grade, estrogen receptor 91% positive with strong staining intensity, progesterone receptor negative. There were multiple margins that were "very close" (less than half a millimeter).  Accordingly on 02/02/2014 the patient underwent further left breast excision. Margins remained close, but now are clear. The patient had an uneventful postoperative course and t proceeded to adjuvant radiation, which will be completed late May 2015.  Her subsequent history is as detailed below.  INTERVAL HISTORY: Tamara [name] returns to clinic today for follow up of her breast cancer. She is very anxious every time she come into office, she states. She has been on anastrozole since May of this year. She is tolerating this well with occasional hot flashes. This is managed well with 37.5mg  venlafaxine BID. She denies vaginal changes. She is unable to exercise because of bilateral knee problems for which she is prescribed tramadol. The interval history is significant for cellulitis to her left leg which is now resolved.  REVIEW OF SYSTEMS: Tamara [name] denies fevers, chills, nausea, or vomiting. She has chronic constipation for which she takes stool softeners routine. She denies chest  pain, shortness of breath or cough. She has occasional headaches. A detailed review of systems is otherwise negative.   PAST MEDICAL HISTORY: Past Medical History  Diagnosis Date  . Obese   . Cellulitis and abscess of leg   . Wears glasses   . Hearing loss   . Hyperlipidemia     takes Zocor nightly  . Hypertension     takes Coreg and Losartan daily  . Asthma     rare;only when around alot of dust-Ventolin inhaler as needed  . History of bronchitis 1966  . Sleep apnea, obstructive     uses CPAP  . Arthritis     knees   . Joint pain   . Joint swelling   . Peripheral edema     takes Furosemide daily as needed and Potassium daily  . Depression     takes Paxil daily  . Complication of anesthesia     was told 01/06/14 that airway was small  . Restless legs syndrome     takes depakote  . Breast cancer     left  . Radiation 03/08/14-04/26/14    50.4 gray to left breast. Lumpectomy cavity boosted to 64.4 gray    PAST SURGICAL HISTORY: Past Surgical History  Procedure Laterality Date  . Tubal ligation    . Inner ear surgery Bilateral     for hearing loss  . Dilatation & curettage/hysteroscopy with trueclear N/A 01/06/2014    Procedure: DILATATION & CURETTAGE/HYSTEROSCOPY WITH TRUCLEAR;  Surgeon: Shon Millet II, MD;  Location: [facility];  Service: Gynecology;  Laterality: N/A;  . Joint replacement Right     Knee  . Colonoscopy    . Breast lumpectomy with needle localization Left 01/24/2014  Procedure: BREAST LUMPECTOMY WITH NEEDLE LOCALIZATION;  Surgeon: Edward Jolly, MD;  Location: [facility];  Service: General;  Laterality: Left;  . Re-excision of breast lumpectomy Left 02/02/2014    Procedure: RE-EXCISION OF LEFT BREAST LUMPECTOMY;  Surgeon: Edward Jolly, MD;  Location: [facility];  Service: General;  Laterality: Left;    FAMILY HISTORY Family History  Problem Relation Age of Onset  . Dementia Mother   . Asthma Other   . Hypertension Other   . Thyroid disease Other     . Heart attack Other   . Esophageal cancer Father     also had stomach cancer  . Throat cancer Paternal Grandfather    the patient's father died in his late 78s from throat and stomach cancer. The patient's mother died in her early 70s with congestive heart failure and dementia. The patient had 2 brothers, no sisters. There is no history of breast or ovarian cancer in the family  GYNECOLOGIC HISTORY:   Menarche age 65, first live birth age 38, she is Tamara [name] Tamara [name]. She went through menopause more than 15 years ago. She did not take hormone replacement. 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 She is divorced and lives by herself, with no pets. Son Tamara [name] works as an Clinical biochemist for [employer]. Son Tamara [name] works as a Printmaker for the same company. Daughter Tamara [name] is an Scientist, physiological for this could feel that the patient has 1 grandchild and one on the way with 4 stepgrandchildren    ADVANCED DIRECTIVES:  in place. The patient has named her son Tamara [name] as her healthcare power of attorney. He can be reached at [phone]   HEALTH MAINTENANCE: History  Substance Use Topics  . Smoking status: Never Smoker   . Smokeless tobacco: Not on file  . Alcohol Use: No     Colonoscopy: 2008/[place]   PAP: 2014   [facility]   Lipid panel:  No Known Allergies  Current Outpatient Prescriptions  Medication Sig Dispense Refill  . anastrozole (ARIMIDEX) 1 MG tablet Take 1 tablet (1 mg total) by mouth daily.  30 tablet  3  . aspirin 81 MG tablet Take 81 mg by mouth daily.        . carvedilol (COREG) 25 MG tablet 2 in the morning one at bedtime  300 tablet  3  . cloNIDine (CATAPRES - DOSED IN MG/24 HR) 0.1 mg/24hr patch Place 1 patch (0.1 mg total) onto the skin once a week. Use for headaches.  4 patch  4  . diphenhydramine-acetaminophen (TYLENOL PM) 25-500 MG TABS  Take 2 tablets by mouth at bedtime as needed (sleep).      . divalproex (DEPAKOTE ER) 250 MG 24 hr tablet Take 250 mg by mouth daily.      . DUAC gel Apply 1 application topically as needed (rash).       . fluticasone (CUTIVATE) 0.05 % cream Apply 1 application topically as needed (rash).       . furosemide (LASIX) 20 MG tablet Take 20 mg by mouth daily. Takes 40 mg in the am & 20 mg at noon      . furosemide (LASIX) 40 MG tablet Take 40 mg by mouth daily. Takes 40 mg in the am & 20 mg at noon      . ketoconazole (NIZORAL) 2 % cream Apply 1 application topically  daily as needed (rash).       . LORazepam (ATIVAN) 1 MG tablet TAKE ONE TABLET BY MOUTH TWICE DAILY      . losartan (COZAAR) 100 MG tablet One twice daily  200 tablet  3  . potassium chloride SA (K-DUR,KLOR-CON) 20 MEQ tablet Take 20 mEq by mouth daily.      . simvastatin (ZOCOR) 20 MG tablet Take 20 mg by mouth every evening.      . traMADol (ULTRAM) 50 MG tablet TAKE ONE TABLET BY MOUTH EVERY 8 HOURS AS NEEDED  90 tablet  0  . venlafaxine XR (EFFEXOR-XR) 37.5 MG 24 hr capsule Pt may increase to 2 tablets a day. If beneficial new script for 75mg  will be called in  60 capsule  0  . albuterol (PROVENTIL HFA;VENTOLIN HFA) 108 (90 BASE) MCG/ACT inhaler Inhale 2 puffs into the lungs every 6 (six) hours as needed for wheezing or shortness of breath.      . fluconazole (DIFLUCAN) 150 MG tablet Take 150 mg by mouth every other day. Only as needed for yeast       No current facility-administered medications for this visit.    OBJECTIVE: MIDDLE-AGED WHITE WOMAN IN NO ACUTE DISTRESS  Filed Vitals:   08/02/14 1410  BP: 206/93  Pulse: 62  Temp: 98.3 F (36.8 C)  Resp: 32     Body mass index is 48.62 kg/(m^2).    ECOG FS:1 - Symptomatic but completely ambulatory  Skin: warm, dry  HEENT: sclerae anicteric, conjunctivae pink, oropharynx clear. No thrush or mucositis.  Lymph Nodes: No cervical or supraclavicular lymphadenopathy  Lungs: clear  to auscultation bilaterally, no rales, wheezes, or rhonci  Heart: regular rate and rhythm  Abdomen: round, soft, non tender, positive bowel sounds  Musculoskeletal: No focal spinal tenderness, no peripheral edema  Neuro: non focal, well oriented, positive affect  Breasts: left breast status post lumpectomy and radiation. No evidence of disease recurrence. Left axilla benign. Right breast unremarkable.   LAB RESULTS: I No results found for this basename: SPEP,  UPEP,   kappa and lambda light chains    Lab Results  Component Value Date   WBC 6.0 04/06/2014   NEUTROABS 4.2 04/06/2014   HGB 14.0 04/06/2014   HCT 42.0 04/06/2014   MCV 85.1 04/06/2014   PLT 156 04/06/2014      Chemistry      Component Value Date/Time   NA 145 04/06/2014 1602   NA 139 02/02/2014 0742   K 4.4 04/06/2014 1602   K 4.4 02/02/2014 0742   CL 99 02/02/2014 0742   CO2 23 04/06/2014 1602   CO2 22 02/02/2014 0742   BUN 16.4 04/06/2014 1602   BUN 23 02/02/2014 0742   CREATININE 0.9 04/06/2014 1602   CREATININE 0.77 02/02/2014 0742   CREATININE 0.78 08/02/2011 0834      Component Value Date/Time   CALCIUM 9.5 04/06/2014 1602   CALCIUM 9.9 02/02/2014 0742   ALKPHOS 61 04/06/2014 1602   ALKPHOS 54 09/21/2013 0853   AST 28 04/06/2014 1602   AST 22 09/21/2013 0853   ALT 29 04/06/2014 1602   ALT 22 09/21/2013 0853   BILITOT 0.43 04/06/2014 1602   BILITOT 0.5 09/21/2013 0853       No results found for this basename: LABCA2    No components found with this basename: LABCA125    No results found for this basename: INR,  in the last 168 hours  Urinalysis    Component  Value Date/Time   COLORURINE yellow 03/19/2010 0815   APPEARANCEUR Clear 03/19/2010 0815   LABSPEC 1.015 03/19/2010 0815   PHURINE 7.0 03/19/2010 0815   GLUCOSEU NEGATIVE 11/14/2009 1155   HGBUR negative 03/19/2010 0815   HGBUR NEGATIVE 11/14/2009 1155   BILIRUBINUR n 09/21/2013 1108   BILIRUBINUR negative 03/19/2010 0815   KETONESUR NEGATIVE 11/14/2009 1155   PROTEINUR n  09/21/2013 1108   PROTEINUR NEGATIVE 11/14/2009 1155   UROBILINOGEN 0.2 09/21/2013 1108   UROBILINOGEN 0.2 03/19/2010 0815   NITRITE n 09/21/2013 1108   NITRITE negative 03/19/2010 0815   LEUKOCYTESUR Negative 09/21/2013 1108    STUDIES: Most recent bone density scan on 08/07/2013 was normal  ASSESSMENT: 63 y.o. status post left lumpectomy 01/24/2014 for ductal carcinoma in situ, high-grade, estrogen receptor 91% positive, progesterone receptor negative, with positive margins  (1) status post further left breast excision for margin clearance 02/02/2014, achieving close but negative margins.  (2) undergoing radiation therapy to the left breast to be completed 04/26/2014  PLAN: Kila is doing well, having now been on the anastrozole for 4 months. The lab work was reviewed in detail with her and was stable. We will continue the anastrozole for a total of 5 years along with the venlafaxine for her hot flashes. Her next mammogram is due in November of this year. Tamara [name] is also due for a repeat bone density scan now so this has been ordered for some time this month.   I encouraged Tamara [name] to stay hydrated and to increase her fresh fruit and vegetable intake to help her constipation. I also advised her to add miralax daily to her routine to assist the stool softeners with a goal of one soft formed bowel movement at least every 3 days.   Tamara [name] will return in March for labs and an office visit. She understands the goal for treatment in her case is cure. She has been encouraged to call with any issues that might arise before her next visit here.   Tamara Duster, NP   08/02/2014 3:31 PM

## 2014-08-02 NOTE — Telephone Encounter (Signed)
per pof to sch pt appt-sch pt DEXA-gave pt time/date/location-gave pt copy of sch

## 2014-08-23 ENCOUNTER — Ambulatory Visit (INDEPENDENT_AMBULATORY_CARE_PROVIDER_SITE_OTHER): Payer: BC Managed Care – PPO | Admitting: General Surgery

## 2014-08-26 ENCOUNTER — Other Ambulatory Visit: Payer: Self-pay | Admitting: Family Medicine

## 2014-08-29 ENCOUNTER — Other Ambulatory Visit: Payer: Self-pay | Admitting: Oncology

## 2014-08-29 DIAGNOSIS — C50412 Malignant neoplasm of upper-outer quadrant of left female breast: Secondary | ICD-10-CM

## 2014-08-30 ENCOUNTER — Other Ambulatory Visit: Payer: Self-pay | Admitting: *Deleted

## 2014-08-30 ENCOUNTER — Ambulatory Visit (HOSPITAL_COMMUNITY): Payer: BC Managed Care – PPO

## 2014-08-30 DIAGNOSIS — C50412 Malignant neoplasm of upper-outer quadrant of left female breast: Secondary | ICD-10-CM

## 2014-08-30 MED ORDER — VENLAFAXINE HCL ER 37.5 MG PO CP24: ORAL_CAPSULE | ORAL | Status: DC

## 2014-09-11 ENCOUNTER — Ambulatory Visit (INDEPENDENT_AMBULATORY_CARE_PROVIDER_SITE_OTHER): Payer: BC Managed Care – PPO | Admitting: Neurology

## 2014-09-11 ENCOUNTER — Encounter: Payer: Self-pay | Admitting: Neurology

## 2014-09-11 VITALS — BP 199/91 | HR 73 | Temp 98.4°F | Ht 64.0 in | Wt 306.0 lb

## 2014-09-11 DIAGNOSIS — G4733 Obstructive sleep apnea (adult) (pediatric): Secondary | ICD-10-CM

## 2014-09-11 DIAGNOSIS — Z9989 Dependence on other enabling machines and devices: Principal | ICD-10-CM

## 2014-09-11 DIAGNOSIS — R600 Localized edema: Secondary | ICD-10-CM

## 2014-09-11 NOTE — Patient Instructions (Signed)
Please continue using your CPAP regularly. While your insurance requires that you use CPAP at least 4 hours each night on 70% of the nights, I recommend, that you not skip any nights and use it throughout the night if you can. Getting used to CPAP and staying with the treatment long term does take time and patience and discipline. Untreated obstructive sleep apnea when it is moderate to severe can have an adverse impact on cardiovascular health and raise her risk for heart disease, arrhythmias, hypertension, congestive heart failure, stroke and diabetes. Untreated obstructive sleep apnea causes sleep disruption, nonrestorative sleep, and sleep deprivation. This can have an impact on your day to day functioning and cause daytime sleepiness and impairment of cognitive function, memory loss, mood disturbance, and problems focussing. Using CPAP regularly can improve these symptoms.  We will order you a new CPAP machine.   Keep up the good work! I will see you back in 6 months for sleep apnea check up, and if you continue to do well on CPAP I will see you once a year thereafter.

## 2014-09-11 NOTE — Progress Notes (Signed)
Subjective:    Patient ID: Tamara [name] is a 63 y.o. female.  HPI    Interim history:   Tamara [name] is a very pleasant 63 year old right-handed lady with an underlying medical history of hypertension, hyperlipidemia, asthma, allergic rhinitis, morbid obesity, peripheral edema, depression, restless leg syndrome, recent diagnosis of left-sided breast cancer, and osteoarthritis who presents for followup consultation of her severe OSA treated with CPAP. I last saw her on 10/04/2013, at which time she indicated full compliance with CPAP and had complaints of severe mouth dryness. She indicated not taking her Lasix as prescribed because she was not able to take frequent bathroom breaks at work. She had, however, changed her departments at work and had a new supervisor who she felt would be more understanding. She has right leg pain in her knees and had multiple surgeries to her right knee.  Today, I reviewed her compliance data from 05/16/2014 through 06/14/2014 which is a total of 30 days during which time she used her machine every night. Percent used days greater than 4 hours was 100%, indicating superb compliance. Average usage of 7 hours and 55 minutes, residual AHI low at 1 per hour, pressure at 20 cm. Today, she reports that her cancer medication causes her hot flashes and she had an exisional Bx on in 2/15 and she completed XRT in 5/15 and started Arimidex. She is faithful with her CPAP. She would like to get an updated CPAP machine.   I first met her on 03/16/2013, at which time she reported a prior diagnosis of OSA and a recent problem with her CPAP machine which broke. She was on a high pressure of 20 cm using a small full face mask. At the time, her BP was very high and she indicated, that she had been without her hypertension medication for about a week. She was quite tearful and indicated that she had trouble affording her medications. She has no acute symptoms associated with the blood  pressure which was 210/91 initially and upon manual recheck 180/112. We got in touch with her primary care physician's office and they indicated that they would call the patient.  I asked her to come back for a sleep study and she had a split-night sleep study on 04/01/2013. I went over her test results with her in detail today. Sleep efficiency at baseline was reduced at 68.6% with a latency to sleep of 53 minutes and wake after sleep onset of 4.5 minutes with severe sleep fragmentation noted. She had a increased percentage of stage I sleep, absence of slow-wave and REM sleep. She had loud snoring. Her baseline AHI was 66.5 per hour. Her oxygen saturation at baseline was 93%, her nadir was 84%. She was then titrated on CPAP, using the nasal mask. He was titrated from 6-9 cm of water pressure with reduction of her AHI to 0 events per hour at that pressure.  I reviewed compliance data from her machine, as we could not get it to download from the SD card. She had an older machine, but was eligible for a new machine in 2/15. She had been using the machine every day for the past 30 days. Percent used days greater than 4 hours was 100%, her average usage of 7 hours and 54 minutes, residual AHI is 0.9 per hour, pressure is set at 20 cm.   Her Past Medical History Is Significant For: Past Medical History  Diagnosis Date  . Obese   . Cellulitis and abscess of  leg   . Wears glasses   . Hearing loss   . Hyperlipidemia     takes Zocor nightly  . Hypertension     takes Coreg and Losartan daily  . Asthma     rare;only when around alot of dust-Ventolin inhaler as needed  . History of bronchitis 1966  . Sleep apnea, obstructive     uses CPAP  . Arthritis     knees   . Joint pain   . Joint swelling   . Peripheral edema     takes Furosemide daily as needed and Potassium daily  . Depression     takes Paxil daily  . Complication of anesthesia     was told 01/06/14 that airway was small  . Restless legs  syndrome     takes depakote  . Breast cancer     left  . Radiation 03/08/14-04/26/14    50.4 gray to left breast. Lumpectomy cavity boosted to 64.4 gray    Her Past Surgical History Is Significant For: Past Surgical History  Procedure Laterality Date  . Tubal ligation    . Inner ear surgery Bilateral     for hearing loss  . Dilatation & curettage/hysteroscopy with trueclear N/A 01/06/2014    Procedure: DILATATION & CURETTAGE/HYSTEROSCOPY WITH TRUCLEAR;  Surgeon: Shon Millet II, MD;  Location: [facility];  Service: Gynecology;  Laterality: N/A;  . Joint replacement Right     Knee  . Colonoscopy    . Breast lumpectomy with needle localization Left 01/24/2014    Procedure: BREAST LUMPECTOMY WITH NEEDLE LOCALIZATION;  Surgeon: Edward Jolly, MD;  Location: [facility];  Service: General;  Laterality: Left;  . Re-excision of breast lumpectomy Left 02/02/2014    Procedure: RE-EXCISION OF LEFT BREAST LUMPECTOMY;  Surgeon: Edward Jolly, MD;  Location: [facility];  Service: General;  Laterality: Left;  . Hysteroplasty  01/2014    Her Family History Is Significant For: Family History  Problem Relation Age of Onset  . Dementia Mother   . Asthma Other   . Hypertension Other   . Thyroid disease Other   . Heart attack Other   . Esophageal cancer Father     also had stomach cancer  . Throat cancer Paternal Grandfather     Her Social History Is Significant For: History   Social History  . Marital Status: Divorced    Spouse Name: N/A    Number of Children: 3  . Years of Education: N/A   Occupational History  . Deputy [employer]    Social History Main Topics  . Smoking status: Never Smoker   . Smokeless tobacco: None  . Alcohol Use: No  . Drug Use: No  . Sexual Activity: None   Other Topics Concern  . None   Social History Narrative  . None    Her Allergies Are:  No Known Allergies:   Her Current Medications Are:  Outpatient Encounter Prescriptions as of 09/11/2014  Medication  Sig  . albuterol (PROVENTIL HFA;VENTOLIN HFA) 108 (90 BASE) MCG/ACT inhaler Inhale 2 puffs into the lungs every 6 (six) hours as needed for wheezing or shortness of breath.  . anastrozole (ARIMIDEX) 1 MG tablet TAKE ONE TABLET BY MOUTH ONCE DAILY  . carvedilol (COREG) 25 MG tablet 2 in the morning one at bedtime  . cloNIDine (CATAPRES - DOSED IN MG/24 HR) 0.1 mg/24hr patch Place 1 patch (0.1 mg total) onto the skin once a week. Use for headaches.  . diphenhydramine-acetaminophen (TYLENOL  PM) 25-500 MG TABS Take 2 tablets by mouth at bedtime as needed (sleep).  . divalproex (DEPAKOTE ER) 250 MG 24 hr tablet Take 250 mg by mouth daily.  . DUAC gel Apply 1 application topically as needed (rash).   . fluconazole (DIFLUCAN) 150 MG tablet Take 150 mg by mouth every other day. Only as needed for yeast  . fluticasone (CUTIVATE) 0.05 % cream Apply 1 application topically as needed (rash).   . furosemide (LASIX) 20 MG tablet Take 20 mg by mouth daily. Takes 40 mg in the am & 20 mg at noon  . furosemide (LASIX) 40 MG tablet Take 40 mg by mouth daily. Takes 40 mg in the am & 20 mg at noon  . ketoconazole (NIZORAL) 2 % cream Apply 1 application topically daily as needed (rash).   . LORazepam (ATIVAN) 1 MG tablet TAKE ONE TABLET BY MOUTH TWICE DAILY  . losartan (COZAAR) 100 MG tablet One twice daily  . potassium chloride SA (K-DUR,KLOR-CON) 20 MEQ tablet Take 20 mEq by mouth daily.  . simvastatin (ZOCOR) 20 MG tablet Take 20 mg by mouth every evening.  . traMADol (ULTRAM) 50 MG tablet TAKE ONE TABLET BY MOUTH EVERY 8 HOURS AS NEEDED  . venlafaxine XR (EFFEXOR-XR) 37.5 MG 24 hr capsule Pt may increase to 2 tablets a day. If beneficial new script for 31m will be called in  . aspirin 81 MG tablet Take 81 mg by mouth daily.    : Review of Systems:  Out of a complete 14 point review of systems, all are reviewed and negative with the exception of these symptoms as listed below:   Review of Systems   Constitutional: Positive for diaphoresis and fatigue.  HENT: Positive for hearing loss.        Tinnitus   Respiratory: Positive for wheezing.   Cardiovascular: Positive for leg swelling.  Gastrointestinal: Positive for constipation.  Endocrine:       Excessive thirst  Musculoskeletal:       Joint pain, joint swelling, back pain, aching muscles, walking difficulty  Skin: Positive for rash.  Neurological:       RL, frequent waking, headache  Psychiatric/Behavioral:       Depression    Objective:  Neurologic Exam  Physical Exam Physical Examination:   Filed Vitals:   09/11/14 1427  BP: 199/91  Pulse: 73  Temp: 98.4 F (36.9 C)   General Examination: The patient is a very pleasant female in no acute distress. She is morbidly obese. She is in good spirits today. Blood pressure upon recheck is mildly improved. She has always had high blood pressure at the doctor's office. She denies any headache, blurry vision, slurring of speech, chest pain, shortness of breath.  HEENT: Normocephalic, atraumatic, pupils are equal, round and reactive to light and accommodation. Extraocular tracking is good without nystagmus noted. Fundoscopy is normal. Normal smooth pursuit is noted. Hearing is grossly intact. Face is symmetric with normal facial animation and normal facial sensation. Speech is clear with no dysarthria noted. There is no hypophonia. There is no lip, neck or jaw tremor. Neck is supple with full range of motion. There are no carotid bruits on auscultation. Oropharynx exam reveals normal findings. Moderated airway crowding is noted d/t narrow airway entry and larger uvula, redundant soft palate. Mallampati is class III. Neck size is large. Tongue protrudes centrally and palate elevates symmetrically. Tonsils are 1+.   Chest: is clear to auscultation without wheezing, rhonchi or crackles noted.  Heart: sounds are regular and normal without murmurs, rubs or gallops noted.   Abdomen: is  soft, non-tender and obese, protruberant with normal bowel sounds appreciated on auscultation.  Extremities: There is 1-2+ pitting edema in the distal lower extremities bilaterally, L more than R. She had cellulitis in the L leg years ago.   Skin: is warm and dry with no trophic changes noted.  Musculoskeletal: exam reveals mild joint swelling in her knees and mild erythema in her distal L leg.   Neurologically:  Mental status: The patient is awake, alert and oriented in all 4 spheres. Her memory, attention, language and knowledge are appropriate. There is no aphasia, agnosia, apraxia or anomia. Speech is clear with normal prosody and enunciation. Thought process is linear. Mood is slightly tearful and affect is normal.  Cranial nerves are as described above under HEENT exam. In addition, shoulder shrug is normal with equal shoulder height noted. Motor exam: Normal bulk, strength and tone is noted. There is no drift, tremor or rebound. Romberg is negative. Reflexes are trace in the UEs and absent in the LEs. Toes are downgoing bilaterally. Fine motor skills are intact with normal finger taps, normal hand movements, normal rapid alternating patting, normal foot taps and normal foot agility.  Cerebellar testing shows no dysmetria or intention tremor on finger to nose testing. There is no truncal or gait ataxia.  Sensory exam is intact to light touch, pinprick, vibration, temperature sense and proprioception in the upper and lower extremities.  Gait, station and balance: slow and slightly insecure gait, mostly d/t knee pain and body habitus. No veering to one side is noted. No leaning to one side is noted. Posture is age-appropriate and stance is wide based. No problems turning are noted. She turns en bloc. Tandem walk is possible with difficulty, she cannot do toe and heel stance.               Assessment and Plan:   In summary, SANARI OFFNER is a very pleasant 63 year old female with an underlying  history of morbid obesity, hypertension, leg swelling with history of cellulitis and recent diagnosis of left upper outer quadrant breast cancer for which she has been treated and is in close followup, who presents for followup consultation of her severe obstructive sleep apnea, on treatment with CPAP at a pressure of 20 cwp with full compliance. Today I again prescribed a new machine as she is eligible for a new machine. She never actually changed the pressure based on her latest sleep test results. Down the Road we can place her on a AutoPap trial to try to see if we can lower her pressure. We talked about her compliance data results as well today. She is commended on her full compliance and encouraged to continue using her CPAP. She should be able to get an updated machine at this time. I will see her back in 6 months, sooner if the need arises. We also talked about trying to maintaining a healthy lifestyle in general. I encouraged the patient to eat healthy, exercise daily and keep well hydrated, to keep a scheduled bedtime and wake time routine, to not skip any meals and eat healthy snacks in between meals and to have protein with every meal. I stressed the importance of regular exercise.   I answered all her questions today and the patient was in agreement with the above outlined plan.

## 2014-09-15 ENCOUNTER — Ambulatory Visit (INDEPENDENT_AMBULATORY_CARE_PROVIDER_SITE_OTHER): Payer: BC Managed Care – PPO | Admitting: General Surgery

## 2014-09-21 ENCOUNTER — Other Ambulatory Visit (INDEPENDENT_AMBULATORY_CARE_PROVIDER_SITE_OTHER): Payer: BC Managed Care – PPO

## 2014-09-21 DIAGNOSIS — E785 Hyperlipidemia, unspecified: Secondary | ICD-10-CM

## 2014-09-21 DIAGNOSIS — Z0000 Encounter for general adult medical examination without abnormal findings: Secondary | ICD-10-CM

## 2014-09-21 LAB — BASIC METABOLIC PANEL
BUN: 30 mg/dL — ABNORMAL HIGH (ref 6–23)
CALCIUM: 9.2 mg/dL (ref 8.4–10.5)
CO2: 23 mEq/L (ref 19–32)
CREATININE: 1.2 mg/dL (ref 0.4–1.2)
Chloride: 105 mEq/L (ref 96–112)
GFR: 49.91 mL/min — ABNORMAL LOW (ref >60.00)
Glucose, Bld: 117 mg/dL — ABNORMAL HIGH (ref 70–99)
Potassium: 4.5 mEq/L (ref 3.5–5.1)
Sodium: 143 mEq/L (ref 135–145)

## 2014-09-21 LAB — POCT URINALYSIS DIPSTICK
Bilirubin, UA: NEGATIVE
Blood, UA: NEGATIVE
Glucose, UA: NEGATIVE
KETONES UA: NEGATIVE
Leukocytes, UA: NEGATIVE
Nitrite, UA: NEGATIVE
PROTEIN UA: NEGATIVE
SPEC GRAV UA: 1.01
Urobilinogen, UA: 0.2
pH, UA: 6

## 2014-09-21 LAB — CBC WITH DIFFERENTIAL/PLATELET
BASOS PCT: 0.5 % (ref 0.0–3.0)
Basophils Absolute: 0 10*3/uL (ref 0.0–0.1)
EOS PCT: 3.2 % (ref 0.0–5.0)
Eosinophils Absolute: 0.2 10*3/uL (ref 0.0–0.7)
HCT: 39.9 % (ref 36.0–46.0)
Hemoglobin: 13.2 g/dL (ref 12.0–15.0)
Lymphocytes Relative: 24.5 % (ref 12.0–46.0)
Lymphs Abs: 1.2 10*3/uL (ref 0.7–4.0)
MCHC: 33.1 g/dL (ref 30.0–36.0)
MCV: 87.8 fl (ref 78.0–100.0)
Monocytes Absolute: 0.5 10*3/uL (ref 0.1–1.0)
Monocytes Relative: 9.9 % (ref 3.0–12.0)
NEUTROS PCT: 61.9 % (ref 43.0–77.0)
Neutro Abs: 3.1 10*3/uL (ref 1.4–7.7)
Platelets: 188 10*3/uL (ref 150.0–400.0)
RBC: 4.55 Mil/uL (ref 3.87–5.11)
RDW: 13.6 % (ref 11.5–15.5)
WBC: 5 10*3/uL (ref 4.0–10.5)

## 2014-09-21 LAB — HEPATIC FUNCTION PANEL
ALK PHOS: 58 U/L (ref 39–117)
ALT: 16 U/L (ref 0–35)
AST: 16 U/L (ref 0–37)
Albumin: 3.3 g/dL — ABNORMAL LOW (ref 3.5–5.2)
BILIRUBIN TOTAL: 0.6 mg/dL (ref 0.2–1.2)
Bilirubin, Direct: 0.1 mg/dL (ref 0.0–0.3)
TOTAL PROTEIN: 7.4 g/dL (ref 6.0–8.3)

## 2014-09-21 LAB — LIPID PANEL
CHOL/HDL RATIO: 4
Cholesterol: 176 mg/dL (ref 0–200)
HDL: 43.5 mg/dL (ref >39.00)
NonHDL: 132.5
TRIGLYCERIDES: 207 mg/dL — AB (ref 0.0–149.0)
VLDL: 41.4 mg/dL — ABNORMAL HIGH (ref 0.0–40.0)

## 2014-09-21 LAB — TSH: TSH: 2.27 u[IU]/mL (ref 0.35–4.50)

## 2014-09-21 LAB — LDL CHOLESTEROL, DIRECT: Direct LDL: 100.3 mg/dL

## 2014-09-26 ENCOUNTER — Other Ambulatory Visit: Payer: Self-pay | Admitting: *Deleted

## 2014-09-26 DIAGNOSIS — C50412 Malignant neoplasm of upper-outer quadrant of left female breast: Secondary | ICD-10-CM

## 2014-09-26 MED ORDER — VENLAFAXINE HCL ER 75 MG PO CP24: ORAL_CAPSULE | ORAL | Status: DC

## 2014-09-26 MED ORDER — ANASTROZOLE 1 MG PO TABS: 1.0000 mg | ORAL_TABLET | Freq: Every day | ORAL | Status: DC

## 2014-09-28 ENCOUNTER — Ambulatory Visit (INDEPENDENT_AMBULATORY_CARE_PROVIDER_SITE_OTHER): Payer: BC Managed Care – PPO | Admitting: Family Medicine

## 2014-09-28 ENCOUNTER — Encounter: Payer: Self-pay | Admitting: Family Medicine

## 2014-09-28 VITALS — BP 120/84 | Temp 98.7°F | Ht 64.0 in | Wt 304.0 lb

## 2014-09-28 DIAGNOSIS — C50412 Malignant neoplasm of upper-outer quadrant of left female breast: Secondary | ICD-10-CM

## 2014-09-28 DIAGNOSIS — I10 Essential (primary) hypertension: Secondary | ICD-10-CM

## 2014-09-28 DIAGNOSIS — F329 Major depressive disorder, single episode, unspecified: Secondary | ICD-10-CM | POA: Diagnosis not present

## 2014-09-28 DIAGNOSIS — M19071 Primary osteoarthritis, right ankle and foot: Secondary | ICD-10-CM

## 2014-09-28 DIAGNOSIS — Z23 Encounter for immunization: Secondary | ICD-10-CM | POA: Diagnosis not present

## 2014-09-28 DIAGNOSIS — Z0000 Encounter for general adult medical examination without abnormal findings: Secondary | ICD-10-CM | POA: Insufficient documentation

## 2014-09-28 DIAGNOSIS — E785 Hyperlipidemia, unspecified: Secondary | ICD-10-CM

## 2014-09-28 DIAGNOSIS — F32A Depression, unspecified: Secondary | ICD-10-CM

## 2014-09-28 DIAGNOSIS — G4730 Sleep apnea, unspecified: Secondary | ICD-10-CM

## 2014-09-28 MED ORDER — LOSARTAN POTASSIUM 100 MG PO TABS: ORAL_TABLET | ORAL | Status: DC

## 2014-09-28 MED ORDER — TRAMADOL HCL 50 MG PO TABS: ORAL_TABLET | ORAL | Status: DC

## 2014-09-28 MED ORDER — SIMVASTATIN 20 MG PO TABS
20.0000 mg | ORAL_TABLET | Freq: Every evening | ORAL | Status: DC
Start: 2014-09-28 — End: 2015-10-22

## 2014-09-28 MED ORDER — CARVEDILOL 25 MG PO TABS: ORAL_TABLET | ORAL | Status: DC

## 2014-09-28 MED ORDER — ALBUTEROL SULFATE HFA 108 (90 BASE) MCG/ACT IN AERS
2.0000 | INHALATION_SPRAY | Freq: Four times a day (QID) | RESPIRATORY_TRACT | Status: DC | PRN
Start: 2014-09-28 — End: 2016-11-18

## 2014-09-28 MED ORDER — FUROSEMIDE 40 MG PO TABS: 40.0000 mg | ORAL_TABLET | Freq: Every day | ORAL | Status: DC

## 2014-09-28 MED ORDER — POTASSIUM CHLORIDE CRYS ER 20 MEQ PO TBCR: 20.0000 meq | EXTENDED_RELEASE_TABLET | Freq: Every day | ORAL | Status: DC

## 2014-09-28 NOTE — Progress Notes (Signed)
Subjective:    Patient ID: Tamara [name], female    DOB: 06/12/1951, 63 y.o.   MRN: [id number]  HPI Tamara [name] is a 63 year old single female nonsmoker who comes in today for general physical examination because of a history of hypertension, obesity,,,,,,,,,,,, weight still 304 pounds,,,,,,,, hyperlipidemia, chronic knee pain status post right total knee replacement and depression  She is also followed in pulmonary because of sleep apnea. She tells me she does her CPAP nightly and takes 1 mg of Ativan at bedtime but still can't sleep. Referred back to pulmonary  She sees her oncologist on a regular basis. She had a lumpectomy and postop radiation for breast cancer as noted above. She is on arm index clonidine patches for hot flushes and defecates for migraines  She is due to retire in April [place] Her children convinced her to sign over the house to them and now they tell her that I'll have room for her. She is very upset. Advised her to discuss this with an attorney  She still has a lot of knee pain would like a second opinion about her knees. She had a right total knee replacement many years ago by Dr. Percell Miller  Vaccinations updated by Apolonio Schneiders   Review of Systems  Constitutional: Negative.   HENT: Negative.   Eyes: Negative.   Respiratory: Negative.   Cardiovascular: Negative.   Gastrointestinal: Negative.   Endocrine: Negative.   Genitourinary: Negative.   Musculoskeletal: Negative.   Skin: Negative.   Allergic/Immunologic: Negative.   Neurological: Negative.   Hematological: Negative.   Psychiatric/Behavioral: Negative.        Objective:   Physical Exam  Nursing note and vitals reviewed. Constitutional: She is oriented to person, place, and time. She appears well-developed and well-nourished.  HENT:  Head: Normocephalic and atraumatic.  Right Ear: External ear normal.  Left Ear: External ear normal.  Nose: Nose normal.  Mouth/Throat:  Oropharynx is clear and moist.  Eyes: EOM are normal. Pupils are equal, round, and reactive to light.  Neck: Normal range of motion. Neck supple. No JVD present. No tracheal deviation present. No thyromegaly present.  Cardiovascular: Normal rate, regular rhythm, normal heart sounds and intact distal pulses.  Exam reveals no gallop and no friction rub.   No murmur heard. Pulmonary/Chest: Effort normal and breath sounds normal. No stridor. No respiratory distress. She has no wheezes. She has no rales. She exhibits no tenderness.  Abdominal: Soft. Bowel sounds are normal. She exhibits no distension and no mass. There is no tenderness. There is no rebound and no guarding.  Genitourinary:  Bilateral breast exam right breast normal left has a scar from previous lumpectomy also tattoo marks from previous radiation templates  Musculoskeletal: Normal range of motion.  Persistent morbid obesity scar right knee from previous knee replacement pain chronic left knee would like a second opinion from somebody other than Dr. Percell Miller  Lymphadenopathy:    She has no cervical adenopathy.  Neurological: She is alert and oriented to person, place, and time. She has normal reflexes. No cranial nerve deficit. She exhibits normal muscle tone. Coordination normal.  Skin: Skin is warm and dry. No rash noted. No erythema. No pallor.  Psychiatric: She has a normal mood and affect. Her behavior is normal. Judgment and thought content normal.          Assessment & Plan:  Morbid obesity........... again discussed diet exercise and weight loss  Hypertension at goal continue current  therapy  Hyperlipidemia: Continue current therapy degenerative joint disease with severe pain in her right and left knee continue tramadol ice packs at bedtime refer to Dr. Joni Fears for second opinion  Hyperlipidemia continue simvastatin 20 mg daily  History of depression continue Effexor  Sleep dysfunction continue Ativan  follow-up with the pulmonary specialist because of the persistent insomnia

## 2014-09-28 NOTE — Progress Notes (Signed)
Pre visit review using our clinic review tool, if applicable. No additional management support is needed unless otherwise documented below in the visit note. 

## 2014-09-28 NOTE — Patient Instructions (Signed)
Continue your current medications  Follow-up in one year sooner if any problems  I would consult with Dr. Joni Fears to get a second opinion on your knee pain

## 2014-09-29 ENCOUNTER — Telehealth: Payer: Self-pay | Admitting: Family Medicine

## 2014-09-29 ENCOUNTER — Telehealth: Payer: Self-pay | Admitting: Neurology

## 2014-09-29 NOTE — Telephone Encounter (Signed)
Patient requesting Rx for sleep Aid, please forward to [place].  Patient stated Blood Pressure was good at PCP check up.  Please call and advise, may leave detailed message on voicemail

## 2014-09-29 NOTE — Telephone Encounter (Signed)
Would not recommend sleep aid via prescription due to medication list. Recommend melatonin 3-5 mg 1-2 hours before bedtime.  Called and talked to patient and she agreed to try it. She had tried Tylenol PM She had not yet heard back from [facility] about getting her new machine. They said they had not received the order. The order is in the chart. Please fax order to [place]

## 2014-09-29 NOTE — Telephone Encounter (Signed)
emmi emailed °

## 2014-10-02 DIAGNOSIS — Z23 Encounter for immunization: Secondary | ICD-10-CM

## 2014-10-03 ENCOUNTER — Telehealth: Payer: Self-pay | Admitting: *Deleted

## 2014-10-03 DIAGNOSIS — I10 Essential (primary) hypertension: Secondary | ICD-10-CM

## 2014-10-03 MED ORDER — FUROSEMIDE 40 MG PO TABS: ORAL_TABLET | ORAL | Status: DC

## 2014-10-03 NOTE — Telephone Encounter (Signed)
Neurology decline a prescription for a sleep aid because of the medication she is currently taking.  They did suggest melatonin which the patient tried over the weekend.  However it did not help.  Please advise?

## 2014-10-04 NOTE — Telephone Encounter (Signed)
Left message on machine for patient that a rx for a sleep aid will have to be prescribed by neurology,

## 2014-10-05 NOTE — Telephone Encounter (Signed)
DME order for patient

## 2014-10-18 ENCOUNTER — Other Ambulatory Visit: Payer: Self-pay | Admitting: *Deleted

## 2014-10-18 DIAGNOSIS — I10 Essential (primary) hypertension: Secondary | ICD-10-CM

## 2014-10-18 MED ORDER — FUROSEMIDE 40 MG PO TABS: ORAL_TABLET | ORAL | Status: DC

## 2014-10-29 ENCOUNTER — Other Ambulatory Visit: Payer: Self-pay | Admitting: Family Medicine

## 2014-10-30 ENCOUNTER — Ambulatory Visit (HOSPITAL_COMMUNITY)
Admission: RE | Admit: 2014-10-30 | Discharge: 2014-10-30 | Disposition: A | Discharge status: Home / Self Care | Payer: BC Managed Care – PPO | Source: Ambulatory Visit | Attending: Nurse Practitioner | Admitting: Nurse Practitioner

## 2014-10-30 DIAGNOSIS — M8580 Other specified disorders of bone density and structure, unspecified site: Secondary | ICD-10-CM | POA: Diagnosis present

## 2014-11-11 NOTE — Telephone Encounter (Signed)
none

## 2014-12-20 ENCOUNTER — Encounter: Payer: Self-pay | Admitting: Nurse Practitioner

## 2014-12-21 ENCOUNTER — Other Ambulatory Visit: Payer: Self-pay | Admitting: Nurse Practitioner

## 2014-12-21 ENCOUNTER — Telehealth: Payer: Self-pay | Admitting: *Deleted

## 2014-12-21 DIAGNOSIS — M25552 Pain in left hip: Secondary | ICD-10-CM

## 2014-12-21 DIAGNOSIS — M25511 Pain in right shoulder: Secondary | ICD-10-CM

## 2014-12-21 NOTE — Telephone Encounter (Signed)
Printed and to APP for review.

## 2014-12-21 NOTE — Telephone Encounter (Signed)
See orders and phone notes for 12-21-2014.  Patient will receive xrays and be followed up next week.

## 2014-12-21 NOTE — Telephone Encounter (Signed)
Patient left MyChart messages about rt. Arm and left hip pain and bone scan results.  Denies injury.  Admits to repetitive lifting of large files at work in [employer].  Susanne Borders notified.  Orders received for xrays and follow up.  Selena Lesser NP will see patient for F/U and wants stat xrays morning of 12-25-2014 and see patient after xrays.    Called patient and she needs late afternoon appointments.  Will get xrays Monday at 3:00 pm and can f/u Tuesday afternoon.  Will generate P.O.F with this information.

## 2014-12-22 ENCOUNTER — Telehealth: Payer: Self-pay | Admitting: Oncology

## 2014-12-22 NOTE — Telephone Encounter (Signed)
lvm for pt regarding to Jan appt.Marland KitchenMarland Kitchen

## 2014-12-25 ENCOUNTER — Ambulatory Visit (HOSPITAL_COMMUNITY)
Admission: RE | Admit: 2014-12-25 | Discharge: 2014-12-25 | Disposition: A | Discharge status: Home / Self Care | Payer: BC Managed Care – PPO | Source: Ambulatory Visit | Attending: Nurse Practitioner | Admitting: Nurse Practitioner

## 2014-12-25 DIAGNOSIS — M25511 Pain in right shoulder: Secondary | ICD-10-CM | POA: Diagnosis not present

## 2014-12-25 DIAGNOSIS — M25552 Pain in left hip: Secondary | ICD-10-CM | POA: Insufficient documentation

## 2014-12-25 DIAGNOSIS — C50412 Malignant neoplasm of upper-outer quadrant of left female breast: Secondary | ICD-10-CM | POA: Diagnosis not present

## 2014-12-26 ENCOUNTER — Ambulatory Visit (HOSPITAL_BASED_OUTPATIENT_CLINIC_OR_DEPARTMENT_OTHER): Payer: BC Managed Care – PPO | Admitting: Nurse Practitioner

## 2014-12-26 ENCOUNTER — Ambulatory Visit (HOSPITAL_BASED_OUTPATIENT_CLINIC_OR_DEPARTMENT_OTHER): Payer: BC Managed Care – PPO

## 2014-12-26 VITALS — BP 204/94 | HR 63 | Temp 98.5°F | Resp 18 | Ht 64.0 in | Wt 291.4 lb

## 2014-12-26 DIAGNOSIS — C50412 Malignant neoplasm of upper-outer quadrant of left female breast: Secondary | ICD-10-CM

## 2014-12-26 DIAGNOSIS — R232 Flushing: Secondary | ICD-10-CM

## 2014-12-26 DIAGNOSIS — I10 Essential (primary) hypertension: Secondary | ICD-10-CM

## 2014-12-26 DIAGNOSIS — M8580 Other specified disorders of bone density and structure, unspecified site: Secondary | ICD-10-CM

## 2014-12-26 DIAGNOSIS — M25511 Pain in right shoulder: Secondary | ICD-10-CM

## 2014-12-26 DIAGNOSIS — N951 Menopausal and female climacteric states: Secondary | ICD-10-CM

## 2014-12-27 LAB — VITAMIN D 25 HYDROXY (VIT D DEFICIENCY, FRACTURES): Vit D, 25-Hydroxy: 21 ng/mL — ABNORMAL LOW (ref 30–100)

## 2014-12-28 ENCOUNTER — Other Ambulatory Visit: Payer: Self-pay | Admitting: Nurse Practitioner

## 2014-12-28 ENCOUNTER — Telehealth: Payer: Self-pay | Admitting: Nurse Practitioner

## 2014-12-28 ENCOUNTER — Encounter: Payer: Self-pay | Admitting: Nurse Practitioner

## 2014-12-28 ENCOUNTER — Telehealth: Payer: Self-pay | Admitting: *Deleted

## 2014-12-28 DIAGNOSIS — M25519 Pain in unspecified shoulder: Secondary | ICD-10-CM | POA: Insufficient documentation

## 2014-12-28 DIAGNOSIS — M8580 Other specified disorders of bone density and structure, unspecified site: Secondary | ICD-10-CM | POA: Insufficient documentation

## 2014-12-28 NOTE — Assessment & Plan Note (Signed)
Patient is complaining of chronic pain 1 month to her right shoulder, her left hip, and her bilateral knees.  Patient denies any known injury or trauma to any of these locations.  Briefly reviewed all bone scan results from November scan; as well as previous scans which revealed no evidence of metastatic disease to the bone.  On exam-patient was observed with full range of motion.  Chronic bone pain may be secondary to degenerative disease, due to obesity, or lack of activity.  Will obtain a orthopedic referral for patient for further evaluation and management.

## 2014-12-28 NOTE — Progress Notes (Signed)
SYMPTOM MANAGEMENT CLINIC   HPI: Tamara [name] 64 y.o. female diagnosed with breast cancer.  Patient is status post lumpectomy and radiation treatments.  Patient is currently undergoing anastrozole therapy.  Patient called the cancer Center today requesting urgent care visit.  She is complaining of one month history of chronic right shoulder, left hip, and bilateral knee pain.  She denies any known injury or trauma to these areas.  She continues to complain of some occasional; but stable hot flashes secondary to anastrozole therapy.  She denies any other new symptoms whatsoever.  She denies any recent fevers or chills.  Also, patient obtained a bone scan in late November 2015; and would like to go over these results.   HPI  ROS  Past Medical History  Diagnosis Date  . Obese   . Cellulitis and abscess of leg   . Wears glasses   . Hearing loss   . Hyperlipidemia     takes Zocor nightly  . Hypertension     takes Coreg and Losartan daily  . Asthma     rare;only when around alot of dust-Ventolin inhaler as needed  . History of bronchitis 1966  . Sleep apnea, obstructive     uses CPAP  . Arthritis     knees   . Joint pain   . Joint swelling   . Peripheral edema     takes Furosemide daily as needed and Potassium daily  . Depression     takes Paxil daily  . Complication of anesthesia     was told 01/06/14 that airway was small  . Restless legs syndrome     takes depakote  . Breast cancer     left  . Radiation 03/08/14-04/26/14    50.4 gray to left breast. Lumpectomy cavity boosted to 64.4 gray    Past Surgical History  Procedure Laterality Date  . Tubal ligation    . Inner ear surgery Bilateral     for hearing loss  . Dilatation & curettage/hysteroscopy with trueclear N/A 01/06/2014    Procedure: DILATATION & CURETTAGE/HYSTEROSCOPY WITH TRUCLEAR;  Surgeon: Shon Millet II, MD;  Location: [facility];  Service: Gynecology;  Laterality: N/A;  . Joint replacement Right    Knee  . Colonoscopy    . Breast lumpectomy with needle localization Left 01/24/2014    Procedure: BREAST LUMPECTOMY WITH NEEDLE LOCALIZATION;  Surgeon: Edward Jolly, MD;  Location: [facility];  Service: General;  Laterality: Left;  . Re-excision of breast lumpectomy Left 02/02/2014    Procedure: RE-EXCISION OF LEFT BREAST LUMPECTOMY;  Surgeon: Edward Jolly, MD;  Location: [facility];  Service: General;  Laterality: Left;  . Hysteroplasty  01/2014    has CANDIDIASIS, SKIN; PURE HYPERCHOLESTEROLEMIA; Hyperlipidemia, acquired; Morbid obesity; Depression (emotion); Essential hypertension; Sleep apnea; DCIS (ductal carcinoma in situ) of breast; Breast cancer of upper-outer quadrant of left female breast; Hot flashes; Constipation; Routine general medical examination at a health care facility; Shoulder pain; and Osteopenia on her problem list.    has No Known Allergies.    Medication List       This list is accurate as of: 12/26/14 11:59 PM.  Always use your most recent med list.               albuterol 108 (90 BASE) MCG/ACT inhaler  Commonly known as:  PROVENTIL HFA;VENTOLIN HFA  Inhale 2 puffs into the lungs every 6 (six) hours as needed for wheezing or shortness of breath.  anastrozole 1 MG tablet  Commonly known as:  ARIMIDEX  Take 1 tablet (1 mg total) by mouth daily.     aspirin 81 MG tablet  Take 81 mg by mouth daily.     carvedilol 25 MG tablet  Commonly known as:  COREG  2 in the morning one at bedtime     cloNIDine 0.1 mg/24hr patch  Commonly known as:  CATAPRES - Dosed in mg/24 hr  Place 1 patch (0.1 mg total) onto the skin once a week. Use for headaches.     diphenhydramine-acetaminophen 25-500 MG Tabs  Commonly known as:  TYLENOL PM  Take 2 tablets by mouth at bedtime as needed (sleep).     divalproex 250 MG 24 hr tablet  Commonly known as:  DEPAKOTE ER  Take 250 mg by mouth daily.     divalproex 250 MG 24 hr tablet  Commonly known as:  DEPAKOTE ER  TAKE  ONE TABLET BY MOUTH ONCE DAILY     DUAC gel  Generic drug:  Clindamycin-Benzoyl Per (Refr)  Apply 1 application topically as needed (rash).     fluconazole 150 MG tablet  Commonly known as:  DIFLUCAN  Take 150 mg by mouth every other day. Only as needed for yeast     fluticasone 0.05 % cream  Commonly known as:  CUTIVATE  Apply 1 application topically as needed (rash).     furosemide 40 MG tablet  Commonly known as:  LASIX  Take one and half tabs daily     ketoconazole 2 % cream  Commonly known as:  NIZORAL  Apply 1 application topically daily as needed (rash).     LORazepam 1 MG tablet  Commonly known as:  ATIVAN  TAKE ONE TABLET BY MOUTH TWICE DAILY     losartan 100 MG tablet  Commonly known as:  COZAAR  One twice daily     Melatonin 3 MG Tabs  Take 3-5 mg by mouth at bedtime.     potassium chloride SA 20 MEQ tablet  Commonly known as:  K-DUR,KLOR-CON  Take 1 tablet (20 mEq total) by mouth daily.     simvastatin 20 MG tablet  Commonly known as:  ZOCOR  Take 1 tablet (20 mg total) by mouth every evening.     traMADol 50 MG tablet  Commonly known as:  ULTRAM  1 by mouth twice a day when necessary     venlafaxine XR 75 MG 24 hr capsule  Commonly known as:  EFFEXOR-XR  Pt may increase to 2 tablets a day. If beneficial new script for 75mg  will be called in         PHYSICAL EXAMINATION  Blood pressure 204/94, pulse 63, temperature 98.5 F (36.9 C), temperature source Oral, resp. rate 18, height 5\' 4"  (1.626 m), weight 291 lb 6.4 oz (132.178 kg).  Physical Exam  Constitutional: She is oriented to person, place, and time and well-developed, well-nourished, and in no distress.  HENT:  Head: Normocephalic and atraumatic.  Mouth/Throat: Oropharynx is clear and moist.  Eyes: Conjunctivae and EOM are normal. Pupils are equal, round, and reactive to light. Right eye exhibits no discharge. Left eye exhibits no discharge. No scleral icterus.  Neck: Normal range of  motion. Neck supple. No JVD present. No tracheal deviation present. No thyromegaly present.  Cardiovascular: Normal rate, regular rhythm, normal heart sounds and intact distal pulses.   Pulmonary/Chest: Effort normal and breath sounds normal. No respiratory distress. She has no wheezes. She has  no rales. She exhibits no tenderness.  Abdominal: Soft. Bowel sounds are normal. She exhibits no distension and no mass. There is no tenderness. There is no rebound and no guarding.  Musculoskeletal: Normal range of motion. She exhibits no edema or tenderness.  Lymphadenopathy:    She has no cervical adenopathy.  Neurological: She is alert and oriented to person, place, and time. Gait normal.  Skin: Skin is warm and dry. No rash noted. No erythema.  Psychiatric: Affect normal.  Nursing note and vitals reviewed.   LABORATORY DATA:. Appointment on 12/26/2014  Component Date Value Ref Range Status  . Vit D, 25-Hydroxy 12/26/2014 21* 30 - 100 ng/mL Final   Comment: ** Please note change in reference range(s). **Vitamin D Status           25-OH Vitamin D       Deficiency                <20 ng/mL       Insufficiency         20 - 29 ng/mL       Optimal             > or = 30 ng/mL For 25-OH Vitamin D testing on  patients on D2-supplementation andpatients for whom quantitation of D2 and D3 fractions is required, the[employer] 25-OH VIT D, (D2,D3), LC/MS/MS is recommended: order [id number] (patients > 2 yrs).      RADIOGRAPHIC STUDIES: Dg Shoulder Right  12/25/2014   CLINICAL DATA:  Initial encounter for 2 week history of persistent right shoulder pain.  EXAM: RIGHT SHOULDER - 2+ VIEW  COMPARISON:  None.  FINDINGS: Three views study shows no fracture. No evidence for shoulder separation or dislocation. There is mild degenerative change at the acromioclavicular joint. No worrisome lytic or sclerotic osseous abnormality.  IMPRESSION: Mild degenerative changes at the [facility] joint.  Otherwise unremarkable.    Electronically Signed   By: Misty Stanley M.D.   On: 12/25/2014 16:01   Dg Hip Unilat With Pelvis 2-3 Views Left  12/25/2014   CLINICAL DATA:  LEFT hip pain for 2 weeks. History of breast cancer.  EXAM: DG HIP W/ PELVIS 2-3V*L*  COMPARISON:  None.  FINDINGS: Lower lumbar spondylosis. No fracture or aggressive osseous lesions. Hip joint spaces appear preserved and symmetric. Tiny subchondral cysts are present in the lateral acetabulum bilaterally compatible with mild osteoarthritis. Sacral arcades appear within normal limits. Panniculus projects over the hips bilaterally.  IMPRESSION: Mild bilateral hip osteoarthritis.  No acute abnormality.   Electronically Signed   By: Dereck Ligas M.D.   On: 12/25/2014 16:03    ASSESSMENT/PLAN:    Breast cancer of upper-outer quadrant of left female breast Patient is status post left breast lumpectomy and radiation treatments.  She is currently undergoing anastrozole on a daily basis.  Her primary complaint with the anastrozole treatment is occasional hot flashes; which she states is stable at present.  Patient last obtained a mammogram on 10/25/2014.  Patient has plans to return to the [facility] for labs and a follow-up visit on 02/12/2015.   Essential hypertension Blood pressure elevated to 204/94 while at the cancer Center today.  Confirmed the patient does take all of her blood pressure medications as previously directed.  Patient states she is always very anxious when she comes to the [facility].  Advised patient to check her blood pressure when she arrives back home; and let us know if her blood pressure remains elevated.  Hot flashes Patient does continue to complain some chronic; but stable hot flashes most likely secondary to anastrozole.  Patient takes venlafaxine to manage hot flashes.   Osteopenia Patient obtained a bone scan on 10/30/2014 which did refill chronic osteopenia.  Vitamin D level was obtained today at 21.  Patient states  she has not been taking any daily vitamins or vitamin D supplements in the past.  Advised patient to start taking vitamin D 2000 units per day.  We'll need to recheck a vitamin D level in approximately 3 months.    Shoulder pain Patient is complaining of chronic pain 1 month to her right shoulder, her left hip, and her bilateral knees.  Patient denies any known injury or trauma to any of these locations.  Briefly reviewed all bone scan results from November scan; as well as previous scans which revealed no evidence of metastatic disease to the bone.  On exam-patient was observed with full range of motion.  Chronic bone pain may be secondary to degenerative disease, due to obesity, or lack of activity.  Will obtain a orthopedic referral for patient for further evaluation and management.   Patient stated understanding of all instructions; and was in agreement with this plan of care. The patient knows to call the clinic with any problems, questions or concerns.   Review/collaboration with Dr. Jana Hakim regarding all aspects of patient's visit today.   Total time spent with patient was 40 minutes;  with greater than 75 percent of that time spent in face to face counseling regarding her symptoms, and coordination of care and follow up.  Disclaimer: This note was dictated with voice recognition software. Similar sounding words can inadvertently be transcribed and may not be corrected upon review.   Drue Second, NP 12/28/2014

## 2014-12-28 NOTE — Assessment & Plan Note (Signed)
Blood pressure elevated to 204/94 while at the cancer Center today.  Confirmed the patient does take all of her blood pressure medications as previously directed.  Patient states she is always very anxious when she comes to the [facility].  Advised patient to check her blood pressure when she arrives back home; and let us know if her blood pressure remains elevated.

## 2014-12-28 NOTE — Telephone Encounter (Signed)
Per Tamara Lesser, NP, I left a voice mail message for patient telling her that her vitamin D level is 21. [place] wants her to start taking Vitamin D3 2,000 units po daily. Instructed patient to call [facility] if she had any further questions.

## 2014-12-28 NOTE — Assessment & Plan Note (Signed)
Patient is status post left breast lumpectomy and radiation treatments.  She is currently undergoing anastrozole on a daily basis.  Her primary complaint with the anastrozole treatment is occasional hot flashes; which she states is stable at present.  Patient last obtained a mammogram on 10/25/2014.  Patient has plans to return to the [facility] for labs and a follow-up visit on 02/12/2015.

## 2014-12-28 NOTE — Telephone Encounter (Signed)
Per pof states to return 02/14. Per Office notes is to return to 03/14.

## 2014-12-28 NOTE — Assessment & Plan Note (Signed)
Patient does continue to complain some chronic; but stable hot flashes most likely secondary to anastrozole.  Patient takes venlafaxine to manage hot flashes.

## 2014-12-28 NOTE — Assessment & Plan Note (Signed)
Patient obtained a bone scan on 10/30/2014 which did refill chronic osteopenia.  Vitamin D level was obtained today at 21.  Patient states she has not been taking any daily vitamins or vitamin D supplements in the past.  Advised patient to start taking vitamin D 2000 units per day.  We'll need to recheck a vitamin D level in approximately 3 months.

## 2015-01-05 ENCOUNTER — Encounter: Payer: Self-pay | Admitting: Nurse Practitioner

## 2015-01-05 NOTE — Telephone Encounter (Signed)
Pt informed to increase tramadol to tid- q 8 h & then qid-q 6 h if necessary for pain & to call us on Monday & let us know if this helped or not.  Pt voiced understanding.

## 2015-01-10 ENCOUNTER — Telehealth: Payer: Self-pay | Admitting: Oncology

## 2015-01-10 NOTE — Telephone Encounter (Signed)
Faxed pt medical records to Dr. Veverly Fells [phone]

## 2015-01-28 ENCOUNTER — Other Ambulatory Visit: Payer: Self-pay | Admitting: Family Medicine

## 2015-01-29 ENCOUNTER — Telehealth: Payer: Self-pay | Admitting: Family Medicine

## 2015-01-29 ENCOUNTER — Encounter: Payer: Self-pay | Admitting: Family Medicine

## 2015-01-29 NOTE — Telephone Encounter (Signed)
rx faxed

## 2015-01-29 NOTE — Telephone Encounter (Signed)
Pt request refill of the following: LORazepam (ATIVAN) 1 MG tablet    Phamacy: [facility]

## 2015-02-12 ENCOUNTER — Telehealth: Payer: Self-pay | Admitting: Oncology

## 2015-02-12 ENCOUNTER — Ambulatory Visit (HOSPITAL_BASED_OUTPATIENT_CLINIC_OR_DEPARTMENT_OTHER): Payer: BC Managed Care – PPO | Admitting: Oncology

## 2015-02-12 ENCOUNTER — Other Ambulatory Visit (HOSPITAL_BASED_OUTPATIENT_CLINIC_OR_DEPARTMENT_OTHER): Payer: BC Managed Care – PPO

## 2015-02-12 VITALS — BP 189/83 | HR 57 | Temp 98.4°F | Resp 18 | Ht 64.0 in | Wt 291.1 lb

## 2015-02-12 DIAGNOSIS — I10 Essential (primary) hypertension: Secondary | ICD-10-CM

## 2015-02-12 DIAGNOSIS — G4730 Sleep apnea, unspecified: Secondary | ICD-10-CM

## 2015-02-12 DIAGNOSIS — D0512 Intraductal carcinoma in situ of left breast: Secondary | ICD-10-CM

## 2015-02-12 DIAGNOSIS — M8580 Other specified disorders of bone density and structure, unspecified site: Secondary | ICD-10-CM

## 2015-02-12 DIAGNOSIS — N951 Menopausal and female climacteric states: Secondary | ICD-10-CM

## 2015-02-12 DIAGNOSIS — C50412 Malignant neoplasm of upper-outer quadrant of left female breast: Secondary | ICD-10-CM

## 2015-02-12 DIAGNOSIS — D0510 Intraductal carcinoma in situ of unspecified breast: Secondary | ICD-10-CM

## 2015-02-12 LAB — CBC WITH DIFFERENTIAL/PLATELET
BASO%: 0.4 % (ref 0.0–2.0)
Basophils Absolute: 0 10*3/uL (ref 0.0–0.1)
EOS%: 2.5 % (ref 0.0–7.0)
Eosinophils Absolute: 0.2 10*3/uL (ref 0.0–0.5)
HEMATOCRIT: 39.6 % (ref 34.8–46.6)
HGB: 13.4 g/dL (ref 11.6–15.9)
LYMPH%: 26.2 % (ref 14.0–49.7)
MCH: 28.9 pg (ref 25.1–34.0)
MCHC: 33.8 g/dL (ref 31.5–36.0)
MCV: 85.3 fL (ref 79.5–101.0)
MONO#: 0.7 10*3/uL (ref 0.1–0.9)
MONO%: 9.1 % (ref 0.0–14.0)
NEUT#: 4.4 10*3/uL (ref 1.5–6.5)
NEUT%: 61.8 % (ref 38.4–76.8)
PLATELETS: 189 10*3/uL (ref 145–400)
RBC: 4.64 10*6/uL (ref 3.70–5.45)
RDW: 13.1 % (ref 11.2–14.5)
WBC: 7.1 10*3/uL (ref 3.9–10.3)
lymph#: 1.9 10*3/uL (ref 0.9–3.3)

## 2015-02-12 LAB — COMPREHENSIVE METABOLIC PANEL (CC13)
ALBUMIN: 3.5 g/dL (ref 3.5–5.0)
ALK PHOS: 66 U/L (ref 40–150)
ALT: 28 U/L (ref 0–55)
AST: 16 U/L (ref 5–34)
Anion Gap: 9 mEq/L (ref 3–11)
BILIRUBIN TOTAL: 0.35 mg/dL (ref 0.20–1.20)
BUN: 22.1 mg/dL (ref 7.0–26.0)
CO2: 25 meq/L (ref 22–29)
Calcium: 9.3 mg/dL (ref 8.4–10.4)
Chloride: 106 mEq/L (ref 98–109)
Creatinine: 0.8 mg/dL (ref 0.6–1.1)
EGFR: 81 mL/min/{1.73_m2} — AB (ref >90)
Glucose: 88 mg/dl (ref 70–140)
POTASSIUM: 4.7 meq/L (ref 3.5–5.1)
SODIUM: 140 meq/L (ref 136–145)
TOTAL PROTEIN: 6.8 g/dL (ref 6.4–8.3)

## 2015-02-12 MED ORDER — FLUCONAZOLE 150 MG PO TABS: 150.0000 mg | ORAL_TABLET | ORAL | Status: DC

## 2015-02-12 MED ORDER — ANASTROZOLE 1 MG PO TABS: 1.0000 mg | ORAL_TABLET | Freq: Every day | ORAL | Status: DC

## 2015-02-12 MED ORDER — VENLAFAXINE HCL ER 150 MG PO CP24: 150.0000 mg | ORAL_CAPSULE | Freq: Every day | ORAL | Status: DC

## 2015-02-12 MED ORDER — CLONIDINE HCL 0.1 MG/24HR TD PTWK
0.1000 mg | MEDICATED_PATCH | TRANSDERMAL | Status: DC
Start: 2015-02-12 — End: 2015-08-30

## 2015-02-12 MED ORDER — FLUTICASONE PROPIONATE 0.05 % EX CREA: 1.0000 "application " | TOPICAL_CREAM | CUTANEOUS | Status: DC | PRN

## 2015-02-12 MED ORDER — CLINDAMYCIN PHOS-BENZOYL PEROX 1.2-5 % EX GEL: 1.0000 "application " | CUTANEOUS | Status: DC | PRN

## 2015-02-12 MED ORDER — KETOCONAZOLE 2 % EX CREA: 1.0000 "application " | TOPICAL_CREAM | Freq: Every day | CUTANEOUS | Status: DC | PRN

## 2015-02-12 NOTE — Telephone Encounter (Signed)
Gave pt avs report and appts for march 2017

## 2015-02-12 NOTE — Progress Notes (Signed)
[name]  Telephone:([phone] Fax:([phone]) [phone]     ID: Tamara [name] OB: 14-Nov-1951  MR#: [id number]  VEZ#:[id number]  PCP: Tamara Man, MD GYN:   SU: Tamara [name] OTHER MD: Tamara [name]  CHIEF COMPLAINT: left breast ductal carcinoma in situ CURRENT TREATMENT: anastrozole   BREAST CANCER HISTORY: Tamara [name] had routine screening mammography at the [facility] Center 10/03/2013 showing suspicious calcifications in the left breast. Additional views 10/25/2013 confirmed a 2.6 area of pleomorphic calcification in the subareolar left breast. There was no associated mass. Needle core biopsy could not be performed for body habitus reasons. Excisional biopsy of this area 01/24/2014 showed ([id number]) ductal carcinoma in situ, high-grade, estrogen receptor 91% positive with strong staining intensity, progesterone receptor negative. There were multiple margins that were "very close" (less than half a millimeter).  Accordingly on 02/02/2014 the patient underwent further left breast excision. Margins remained close, but now are clear. The patient had an uneventful postoperative course and t proceeded to adjuvant radiation, which will be completed late May 2015.  Her subsequent history is as detailed below.  INTERVAL HISTORY: Tamara [name] returns today for follow-up of her noninvasive breast cancer. The interval history is generally unremarkable. She continues on anastrozole daily. She tolerates that well. She did have a bone density November which shows osteopenia.  REVIEW OF SYSTEMS: Tamara [name] went through a "viral illness" about a week ago, with arthralgias, mild fever, and diarrhea. Those symptoms have generally resolved. She does have significant arthritis problems and sought orthopedic help but she tells me there was very little they could do. She continues to have problems with night sweats. She does have her clonidine patch on and it helps as does the venlafaxine. She describes  herself as tired. She hurts in her hip and shoulder and these areas were imaged in January when she complained and basically what she has and those places is significant arthritis. She has dermatophytosis problems and has run out of her antifungal's. She has difficulty walking. She gets occasional headaches. She feels anxious and depressed. A detailed review of systems today was otherwise stable.  PAST MEDICAL HISTORY: Past Medical History  Diagnosis Date  . Obese   . Cellulitis and abscess of leg   . Wears glasses   . Hearing loss   . Hyperlipidemia     takes Zocor nightly  . Hypertension     takes Coreg and Losartan daily  . Asthma     rare;only when around alot of dust-Ventolin inhaler as needed  . History of bronchitis 1966  . Sleep apnea, obstructive     uses CPAP  . Arthritis     knees   . Joint pain   . Joint swelling   . Peripheral edema     takes Furosemide daily as needed and Potassium daily  . Depression     takes Paxil daily  . Complication of anesthesia     was told 01/06/14 that airway was small  . Restless legs syndrome     takes depakote  . Breast cancer     left  . Radiation 03/08/14-04/26/14    50.4 gray to left breast. Lumpectomy cavity boosted to 64.4 gray    PAST SURGICAL HISTORY: Past Surgical History  Procedure Laterality Date  . Tubal ligation    . Inner ear surgery Bilateral     for hearing loss  . Dilatation & curettage/hysteroscopy with trueclear N/A 01/06/2014    Procedure: DILATATION & CURETTAGE/HYSTEROSCOPY WITH TRUCLEAR;  Surgeon: Tamara [name]  Tamara Potter II, MD;  Location: [facility];  Service: Gynecology;  Laterality: N/A;  . Joint replacement Right     Knee  . Colonoscopy    . Breast lumpectomy with needle localization Left 01/24/2014    Procedure: BREAST LUMPECTOMY WITH NEEDLE LOCALIZATION;  Surgeon: Tamara Jolly, MD;  Location: [place];  Service: General;  Laterality: Left;  . Re-excision of breast lumpectomy Left 02/02/2014    Procedure:  RE-EXCISION OF LEFT BREAST LUMPECTOMY;  Surgeon: Tamara Jolly, MD;  Location: [facility];  Service: General;  Laterality: Left;  . Hysteroplasty  01/2014    FAMILY HISTORY Family History  Problem Relation Age of Onset  . Dementia Mother   . Asthma Other   . Hypertension Other   . Thyroid disease Other   . Heart attack Other   . Esophageal cancer Father     also had stomach cancer  . Throat cancer Paternal Grandfather    the patient's father died in his late 52s from throat and stomach cancer. The patient's mother died in her early 64s with congestive heart failure and dementia. The patient had 2 brothers, no sisters. There is no history of breast or ovarian cancer in the family  GYNECOLOGIC HISTORY:   Menarche age 46, first live birth age 67, she is [place] P3. She went through menopause more than 15 years ago. She did not take hormone replacement. 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 She is divorced and lives by herself, with no pets. Son Tamara [name] works as an Clinical biochemist for [employer]. Son Tamara [name] works as a Printmaker for the same company. Daughter Tamara [name] is an Scientist, physiological for this could feel. Care in his 4 grandchildren "bilobed", to "by blood", and one on the way.   ADVANCED DIRECTIVES:  in place. The patient has named her son Tamara [name] as her healthcare power of attorney. He can be reached at [phone]   HEALTH MAINTENANCE: History  Substance Use Topics  . Smoking status: Never Smoker   . Smokeless tobacco: Not on file  . Alcohol Use: No     Colonoscopy: 2008/[facility]   PAP: 2014   [facility]   Lipid panel:  No Known Allergies  Current Outpatient Prescriptions  Medication Sig Dispense Refill  . albuterol (PROVENTIL HFA;VENTOLIN HFA) 108 (90 BASE) MCG/ACT inhaler Inhale 2 puffs into the lungs every 6  (six) hours as needed for wheezing or shortness of breath. 2 Inhaler 3  . anastrozole (ARIMIDEX) 1 MG tablet Take 1 tablet (1 mg total) by mouth daily. 90 tablet 3  . aspirin 81 MG tablet Take 81 mg by mouth daily.      . carvedilol (COREG) 25 MG tablet 2 in the morning one at bedtime 300 tablet 3  . cloNIDine (CATAPRES - DOSED IN MG/24 HR) 0.1 mg/24hr patch Place 1 patch (0.1 mg total) onto the skin once a week. Use for headaches. 4 patch 4  . diphenhydramine-acetaminophen (TYLENOL PM) 25-500 MG TABS Take 2 tablets by mouth at bedtime as needed (sleep).    . divalproex (DEPAKOTE ER) 250 MG 24 hr tablet Take 250 mg by mouth daily.    . divalproex (DEPAKOTE ER) 250 MG 24 hr tablet TAKE ONE TABLET BY MOUTH ONCE DAILY 100 tablet 3  . DUAC gel Apply 1 application topically as needed (rash).     Marland Kitchen  fluconazole (DIFLUCAN) 150 MG tablet Take 150 mg by mouth every other day. Only as needed for yeast    . fluticasone (CUTIVATE) 0.05 % cream Apply 1 application topically as needed (rash).     . furosemide (LASIX) 40 MG tablet Take one and half tabs daily 150 tablet 3  . ketoconazole (NIZORAL) 2 % cream Apply 1 application topically daily as needed (rash).     . LORazepam (ATIVAN) 1 MG tablet TAKE ONE TABLET BY MOUTH TWICE DAILY AS NEEDED 60 tablet 5  . losartan (COZAAR) 100 MG tablet One twice daily 200 tablet 3  . Melatonin 3 MG TABS Take 3-5 mg by mouth at bedtime.    . potassium chloride SA (K-DUR,KLOR-CON) 20 MEQ tablet Take 1 tablet (20 mEq total) by mouth daily. 100 tablet 3  . simvastatin (ZOCOR) 20 MG tablet Take 1 tablet (20 mg total) by mouth every evening. 100 tablet 3  . traMADol (ULTRAM) 50 MG tablet 1 by mouth twice a day when necessary 150 tablet 4  . venlafaxine XR (EFFEXOR-XR) 75 MG 24 hr capsule Pt may increase to 2 tablets a day. If beneficial new script for 75mg  will be called in 90 capsule 3   No current facility-administered medications for this visit.    OBJECTIVE: Middle-aged  white woman who appears stated age 64 Vitals:   02/12/15 1348  BP: 189/83  Pulse: 57  Temp: 98.4 F (36.9 C)  Resp: 18     Body mass index is 49.94 kg/(m^2).    ECOG FS:1 - Symptomatic but completely ambulatory  Sclerae unicteric, pupils equal and reactive Oropharynx clear and moist-- no thrush or other lesions No cervical or supraclavicular adenopathy Lungs no rales or rhonchi Heart regular rate and rhythm Abd soft, obese, nontender, positive bowel sounds MSK scoliosis but no focal spinal tenderness Neuro: nonfocal, well oriented, appropriate affect Breasts: The right breast is unremarkable. The left breast is status post lumpectomy and radiation. There is no evidence of local recurrence. The left axilla is benign.   LAB RESULTS: I No results found for: SPEP  Lab Results  Component Value Date   WBC 7.1 02/12/2015   NEUTROABS 4.4 02/12/2015   HGB 13.4 02/12/2015   HCT 39.6 02/12/2015   MCV 85.3 02/12/2015   PLT 189 02/12/2015      Chemistry      Component Value Date/Time   NA 143 09/21/2014 0819   NA 145 04/06/2014 1602   K 4.5 09/21/2014 0819   K 4.4 04/06/2014 1602   CL 105 09/21/2014 0819   CO2 23 09/21/2014 0819   CO2 23 04/06/2014 1602   BUN 30* 09/21/2014 0819   BUN 16.4 04/06/2014 1602   CREATININE 1.2 09/21/2014 0819   CREATININE 0.9 04/06/2014 1602   CREATININE 0.78 08/02/2011 0834      Component Value Date/Time   CALCIUM 9.2 09/21/2014 0819   CALCIUM 9.5 04/06/2014 1602   ALKPHOS 58 09/21/2014 0819   ALKPHOS 61 04/06/2014 1602   AST 16 09/21/2014 0819   AST 28 04/06/2014 1602   ALT 16 09/21/2014 0819   ALT 29 04/06/2014 1602   BILITOT 0.6 09/21/2014 0819   BILITOT 0.43 04/06/2014 1602       No results found for: LABCA2  No components found for: LABCA125  No results for input(s): INR in the last 168 hours.  Urinalysis    Component Value Date/Time   COLORURINE yellow 03/19/2010 0815   APPEARANCEUR Clear 03/19/2010 0815   LABSPEC  1.015 03/19/2010 0815   PHURINE 7.0 03/19/2010 0815   GLUCOSEU NEGATIVE 11/14/2009 1155   HGBUR negative 03/19/2010 0815   HGBUR NEGATIVE 11/14/2009 1155   BILIRUBINUR n 09/21/2014 1024   BILIRUBINUR negative 03/19/2010 0815   KETONESUR NEGATIVE 11/14/2009 1155   PROTEINUR n 09/21/2014 1024   PROTEINUR NEGATIVE 11/14/2009 1155   UROBILINOGEN 0.2 09/21/2014 1024   UROBILINOGEN 0.2 03/19/2010 0815   NITRITE n 09/21/2014 1024   NITRITE negative 03/19/2010 0815   LEUKOCYTESUR Negative 09/21/2014 1024    STUDIES: Bone density scan [facility] 10/30/2014 showed osteopenia with a T score of -2.0  ASSESSMENT: 64 y.o. status post left lumpectomy 01/24/2014 for ductal carcinoma in situ, high-grade, estrogen receptor 91% positive, progesterone receptor negative, with positive margins  (1) status post further left breast excision for margin clearance 02/02/2014, achieving close but negative margins.  (2) undergoing radiation therapy to the left breast completed 04/26/2014  (3) anastrozole started May 2015  (a) bone density scan November 2015 showed a T score of -2.0  PLAN: Tamara [name] is running out of medicines and requested refills on several of her supportive agents, which I was glad to provide for her. She also thought that perhaps if we increase the venlafaxine 250 mg daily that would work a little bit better as far as her hot flashes is concerned. Sincerely may also help her depressive symptoms although these do not appear marked area  She has lost some bone density. Most of the bone density loss with anastrozole occurs during the first year. I think what would really help her is to start an exercise program and I gave her a [employer] pamphlet and I urged her to sign up. I think this would allow her to get stronger in a gradual way without injuring herself. Since she is planning to retire in April she will have time to do this.  Otherwise we are starting yearly visits at this point and  will continue those until she completes 5 years on anastrozole.  She has a good understanding of this plan. She agrees with it. She knows the goal of treatment in her case is cure. She will call with any problems that may develop before her next visit. Chauncey Cruel, MD   02/12/2015 1:57 PM

## 2015-02-13 LAB — VITAMIN D 25 HYDROXY (VIT D DEFICIENCY, FRACTURES): VIT D 25 HYDROXY: 24 ng/mL — AB (ref 30–100)

## 2015-02-14 ENCOUNTER — Other Ambulatory Visit: Payer: Self-pay | Admitting: Nurse Practitioner

## 2015-02-14 DIAGNOSIS — C50412 Malignant neoplasm of upper-outer quadrant of left female breast: Secondary | ICD-10-CM

## 2015-02-14 DIAGNOSIS — M8580 Other specified disorders of bone density and structure, unspecified site: Secondary | ICD-10-CM

## 2015-02-14 DIAGNOSIS — C50912 Malignant neoplasm of unspecified site of left female breast: Secondary | ICD-10-CM

## 2015-02-15 ENCOUNTER — Telehealth: Payer: Self-pay

## 2015-02-15 ENCOUNTER — Telehealth: Payer: Self-pay | Admitting: Nurse Practitioner

## 2015-02-15 ENCOUNTER — Telehealth: Payer: Self-pay | Admitting: *Deleted

## 2015-02-15 NOTE — Telephone Encounter (Signed)
per pof to sch pt appt-cld & left pt a message and left time & date of appt

## 2015-02-15 NOTE — Telephone Encounter (Signed)
This RN left message on identified VM for pt to return call to discuss MD recommendation for vitamin D supplement.  Noted continued low level for vitamin D per recent lab.  MD would like pt to initiate vitamin D 2000 units daily.

## 2015-02-15 NOTE — Telephone Encounter (Signed)
Spoke to patient about deceased Vit D level.  Verified that patient is taking 2000 units of Vit D daily, told patient to continue dosage.  Verbalized understanding.

## 2015-02-16 ENCOUNTER — Encounter: Payer: Self-pay | Admitting: Oncology

## 2015-02-21 ENCOUNTER — Telehealth: Payer: Self-pay | Admitting: Neurology

## 2015-02-21 NOTE — Telephone Encounter (Signed)
Called and left VM message to call back to confirm new appointment at 8:45 on 03/13/15.

## 2015-02-23 ENCOUNTER — Telehealth: Payer: Self-pay | Admitting: *Deleted

## 2015-02-23 NOTE — Telephone Encounter (Signed)
-----   Message from Laurie Panda, NP sent at 02/14/2015  6:35 PM EDT ----- Please call patient to confirm she is taking 2000 units daily of vitamin D supplement. If she has, ask her to increase the dose to 5000 units. Will recheck in 3 months.

## 2015-02-23 NOTE — Telephone Encounter (Signed)
Called pt to inform her of lab results concerning Vitamin D level (24L). Pt said she is currently taking 2000 IU's daily. I told pt to increase dose to 5000 IU's daily per NP. I also told pt that we will recheck her levels in 3 mos. Pt verbalized understanding. No further concerns. Message to be forwarded to [facility].

## 2015-03-13 ENCOUNTER — Ambulatory Visit: Payer: Self-pay | Admitting: Neurology

## 2015-03-15 ENCOUNTER — Other Ambulatory Visit: Payer: Self-pay

## 2015-03-15 ENCOUNTER — Other Ambulatory Visit: Payer: Self-pay | Admitting: General Surgery

## 2015-03-15 DIAGNOSIS — D0512 Intraductal carcinoma in situ of left breast: Secondary | ICD-10-CM | POA: Diagnosis not present

## 2015-03-15 DIAGNOSIS — C50912 Malignant neoplasm of unspecified site of left female breast: Secondary | ICD-10-CM

## 2015-03-15 NOTE — Addendum Note (Signed)
Addended by: Excell Seltzer T on: 03/15/2015 11:45 AM   Modules accepted: Orders

## 2015-03-27 ENCOUNTER — Other Ambulatory Visit: Payer: Self-pay | Admitting: Family Medicine

## 2015-03-29 ENCOUNTER — Other Ambulatory Visit (INDEPENDENT_AMBULATORY_CARE_PROVIDER_SITE_OTHER): Payer: Self-pay

## 2015-03-29 DIAGNOSIS — Z01818 Encounter for other preprocedural examination: Secondary | ICD-10-CM

## 2015-04-03 ENCOUNTER — Telehealth: Payer: Self-pay

## 2015-04-03 NOTE — Telephone Encounter (Signed)
Drug utilization review rcvd from [place] [facility] 04/03/15.  Reviewed by Dr. Jana Hakim.  Sent to scan.

## 2015-04-27 ENCOUNTER — Ambulatory Visit (INDEPENDENT_AMBULATORY_CARE_PROVIDER_SITE_OTHER): Payer: Medicare Other | Admitting: Licensed Clinical Social Worker

## 2015-05-02 ENCOUNTER — Ambulatory Visit (INDEPENDENT_AMBULATORY_CARE_PROVIDER_SITE_OTHER): Payer: Medicare Other | Admitting: Licensed Clinical Social Worker

## 2015-05-18 ENCOUNTER — Other Ambulatory Visit: Payer: Self-pay

## 2015-05-18 ENCOUNTER — Ambulatory Visit (INDEPENDENT_AMBULATORY_CARE_PROVIDER_SITE_OTHER): Payer: Medicare Other | Admitting: Licensed Clinical Social Worker

## 2015-05-18 ENCOUNTER — Ambulatory Visit (HOSPITAL_COMMUNITY)
Admission: RE | Admit: 2015-05-18 | Discharge: 2015-05-18 | Disposition: A | Discharge status: Home / Self Care | Payer: Medicare Other | Source: Ambulatory Visit | Attending: General Surgery | Admitting: General Surgery

## 2015-05-18 DIAGNOSIS — I10 Essential (primary) hypertension: Secondary | ICD-10-CM | POA: Diagnosis not present

## 2015-05-18 DIAGNOSIS — E780 Pure hypercholesterolemia: Secondary | ICD-10-CM | POA: Diagnosis not present

## 2015-05-18 DIAGNOSIS — C50412 Malignant neoplasm of upper-outer quadrant of left female breast: Secondary | ICD-10-CM | POA: Diagnosis not present

## 2015-05-18 DIAGNOSIS — F329 Major depressive disorder, single episode, unspecified: Secondary | ICD-10-CM | POA: Diagnosis not present

## 2015-05-18 DIAGNOSIS — I517 Cardiomegaly: Secondary | ICD-10-CM | POA: Diagnosis not present

## 2015-05-18 DIAGNOSIS — G4730 Sleep apnea, unspecified: Secondary | ICD-10-CM | POA: Insufficient documentation

## 2015-05-18 DIAGNOSIS — E669 Obesity, unspecified: Secondary | ICD-10-CM | POA: Diagnosis not present

## 2015-05-18 DIAGNOSIS — E785 Hyperlipidemia, unspecified: Secondary | ICD-10-CM | POA: Insufficient documentation

## 2015-05-18 DIAGNOSIS — Z01818 Encounter for other preprocedural examination: Secondary | ICD-10-CM | POA: Diagnosis not present

## 2015-05-21 ENCOUNTER — Other Ambulatory Visit (HOSPITAL_COMMUNITY): Payer: BC Managed Care – PPO

## 2015-05-21 ENCOUNTER — Ambulatory Visit: Payer: BC Managed Care – PPO | Admitting: Dietician

## 2015-05-28 ENCOUNTER — Other Ambulatory Visit: Payer: Self-pay

## 2015-05-31 ENCOUNTER — Other Ambulatory Visit (HOSPITAL_BASED_OUTPATIENT_CLINIC_OR_DEPARTMENT_OTHER): Payer: Medicare Other

## 2015-05-31 DIAGNOSIS — C50412 Malignant neoplasm of upper-outer quadrant of left female breast: Secondary | ICD-10-CM

## 2015-05-31 DIAGNOSIS — M8580 Other specified disorders of bone density and structure, unspecified site: Secondary | ICD-10-CM | POA: Diagnosis not present

## 2015-05-31 DIAGNOSIS — M899 Disorder of bone, unspecified: Secondary | ICD-10-CM | POA: Diagnosis not present

## 2015-05-31 DIAGNOSIS — D0512 Intraductal carcinoma in situ of left breast: Secondary | ICD-10-CM | POA: Diagnosis not present

## 2015-06-01 LAB — VITAMIN D 25 HYDROXY (VIT D DEFICIENCY, FRACTURES): Vit D, 25-Hydroxy: 29 ng/mL — ABNORMAL LOW (ref 30–100)

## 2015-06-20 ENCOUNTER — Other Ambulatory Visit: Payer: Self-pay | Admitting: Oncology

## 2015-06-22 ENCOUNTER — Encounter: Payer: Self-pay | Admitting: Oncology

## 2015-06-29 ENCOUNTER — Telehealth: Payer: Self-pay | Admitting: Oncology

## 2015-06-29 ENCOUNTER — Telehealth: Payer: Self-pay | Admitting: *Deleted

## 2015-06-29 NOTE — Telephone Encounter (Signed)
Pt called to this RN to state ongoing hot flashes " that are getting worse ".  Tamara [name] is inquiring " I am wondering if I can increase the medication Dr Jana Hakim gave me for the hot flashes "  Per further inquiry- Tamara [name] states the hot flashes are during the day.  She verified current effexor dose at 75mg  bid ( total daily dose of 150mg  ) . Clonidine patch (TTS ) at 0.1mg .  Noted pt is also on other BP medications.  Per discussion this RN informed pt above request will be given to MD upon return to the office for his recommendations.  Tamara [name] verbalized understanding.  Return call for pt given as [phone].

## 2015-06-29 NOTE — Telephone Encounter (Signed)
Confirmed appointments change in March.

## 2015-07-01 ENCOUNTER — Other Ambulatory Visit: Payer: Self-pay | Admitting: Oncology

## 2015-07-02 ENCOUNTER — Other Ambulatory Visit: Payer: Self-pay | Admitting: Family Medicine

## 2015-07-02 ENCOUNTER — Other Ambulatory Visit: Payer: Self-pay | Admitting: *Deleted

## 2015-07-02 DIAGNOSIS — C50412 Malignant neoplasm of upper-outer quadrant of left female breast: Secondary | ICD-10-CM

## 2015-07-02 DIAGNOSIS — R232 Flushing: Secondary | ICD-10-CM

## 2015-07-02 MED ORDER — GABAPENTIN 300 MG PO CAPS: 300.0000 mg | ORAL_CAPSULE | Freq: Every day | ORAL | Status: DC

## 2015-07-02 NOTE — Telephone Encounter (Signed)
This RN spoke with patient and informed her that I was e-scribing a prescription for Neurontin to take for hot flashes. Per Dr. Jana Hakim, he would like for her to call the office in two weeks to report if she is doing any better. Patient verbalized understanding.

## 2015-07-03 ENCOUNTER — Other Ambulatory Visit: Payer: Self-pay | Admitting: Family Medicine

## 2015-07-09 ENCOUNTER — Other Ambulatory Visit: Payer: Self-pay | Admitting: General Surgery

## 2015-07-09 DIAGNOSIS — Z9889 Other specified postprocedural states: Secondary | ICD-10-CM

## 2015-07-12 ENCOUNTER — Encounter: Payer: Self-pay | Admitting: *Deleted

## 2015-07-13 ENCOUNTER — Ambulatory Visit
Admission: RE | Admit: 2015-07-13 | Discharge: 2015-07-13 | Disposition: A | Discharge status: Home / Self Care | Payer: Medicare Other | Source: Ambulatory Visit | Attending: General Surgery | Admitting: General Surgery

## 2015-07-13 ENCOUNTER — Encounter: Payer: Medicare Other | Attending: General Surgery | Admitting: Dietician

## 2015-07-13 ENCOUNTER — Encounter: Payer: Self-pay | Admitting: Dietician

## 2015-07-13 DIAGNOSIS — R921 Mammographic calcification found on diagnostic imaging of breast: Secondary | ICD-10-CM | POA: Diagnosis not present

## 2015-07-13 DIAGNOSIS — Z6841 Body Mass Index (BMI) 40.0 and over, adult: Secondary | ICD-10-CM | POA: Diagnosis not present

## 2015-07-13 DIAGNOSIS — Z9889 Other specified postprocedural states: Secondary | ICD-10-CM

## 2015-07-13 DIAGNOSIS — Z853 Personal history of malignant neoplasm of breast: Secondary | ICD-10-CM | POA: Diagnosis not present

## 2015-07-13 DIAGNOSIS — Z713 Dietary counseling and surveillance: Secondary | ICD-10-CM | POA: Diagnosis not present

## 2015-07-13 NOTE — Progress Notes (Signed)
  Pre-Op Assessment Visit:  Pre-Operative RYGB or sleeve gastrectomy Surgery  Medical Nutrition Therapy:  Appt start time: 0835   End time:  0915  Patient was seen on 07/13/15 for Pre-Operative Nutrition Assessment. Assessment and letter of approval faxed to [facility]ariatric [facility] Program coordinator on 07/13/2015.   Preferred Learning Style:   No preference indicated   Learning Readiness:   Ready  Handouts given during visit include:  Pre-Op Goals Bariatric Surgery Protein Shakes   During the appointment today the following Pre-Op Goals were reviewed with the patient: Maintain or lose weight as instructed by your surgeon Make healthy food choices Begin to limit portion sizes Limited concentrated sugars and fried foods Keep fat/sugar in the single digits per serving on   food labels Practice CHEWING your food  (aim for 30 chews per bite or until applesauce consistency) Practice not drinking 15 minutes before, during, and 30 minutes after each meal/snack Avoid all carbonated beverages  Avoid/limit caffeinated beverages  Avoid all sugar-sweetened beverages Consume 3 meals per day; eat every 3-5 hours Make a list of non-food related activities Aim for 64-100 ounces of FLUID daily  Aim for at least 60-80 grams of PROTEIN daily Look for a liquid protein source that contain ?15 g protein and ?5 g carbohydrate  (ex: shakes, drinks, shots)  Demonstrated degree of understanding via:  Teach Back  Teaching Method Utilized:  Visual Auditory Hands on  Barriers to learning/adherence to lifestyle change: none  Patient to call the [facility] to enroll in Pre-Op and Post-Op Nutrition Education when surgery date is scheduled.

## 2015-07-13 NOTE — Patient Instructions (Signed)

## 2015-07-28 ENCOUNTER — Other Ambulatory Visit: Payer: Self-pay | Admitting: Oncology

## 2015-07-28 ENCOUNTER — Other Ambulatory Visit: Payer: Self-pay | Admitting: Family Medicine

## 2015-07-30 MED ORDER — TRAMADOL HCL 50 MG PO TABS: 50.0000 mg | ORAL_TABLET | Freq: Three times a day (TID) | ORAL | Status: DC | PRN

## 2015-07-30 NOTE — Addendum Note (Signed)
Addended by: Westley Hummer B on: 07/30/2015 01:34 PM   Modules accepted: Orders

## 2015-08-17 ENCOUNTER — Encounter: Payer: Medicare Other | Attending: General Surgery | Admitting: Dietician

## 2015-08-17 ENCOUNTER — Encounter: Payer: Self-pay | Admitting: Dietician

## 2015-08-17 DIAGNOSIS — Z6841 Body Mass Index (BMI) 40.0 and over, adult: Secondary | ICD-10-CM | POA: Diagnosis not present

## 2015-08-17 DIAGNOSIS — Z713 Dietary counseling and surveillance: Secondary | ICD-10-CM | POA: Diagnosis not present

## 2015-08-17 NOTE — Progress Notes (Signed)
  Supervised Weight Loss:  Appt start time: 920 end time:  0935.  SWL visit 1:  Primary concerns today: Athalene returns for her 1st SWL appointment in preparation for bariatric surgery having gained 1 pound in the last month. She has a new grandbaby and has been busy.  Another copy of the pre op goals was provided and patient chose 2 goals to begin working on in the next month.  Weight: 318.3 lbs BMI: 51.5  Goals: -Work on limiting drinks with added sugar (drink water or water with sugar free flavoring) -Avoid buying trigger foods like ice cream and chicken pot pies (focus on lean meats and vegetables)  -Try Lean Cuisine, Smart Ones, and veggie steamer bags   MEDICATIONS: see list  Recent physical activity: did not assess today  Estimated energy needs: 1400-1600 calories  Progress Towards Goal(s):  In progress.   Nutritional Diagnosis:  [place]-3.3 Overweight/obesity related to past poor dietary habits and physical inactivity as evidenced by patient in SWL for pending bariatric surgery following dietary guidelines for continued weight loss.     Intervention:  Nutrition counseling provided.  Handouts given during visit include:  Pre op goals  Monitoring/Evaluation:  Dietary intake, exercise, and body weight in 4 week(s).

## 2015-08-17 NOTE — Patient Instructions (Signed)
Goals: -Work on limiting drinks with added sugar (drink water or water with sugar free flavoring)  -Avoid buying trigger foods like ice cream and chicken pot pies (focus on lean meats and vegetables)  -Try Lean Cuisine, Smart Ones, and veggie steamer bags  -Start thinking about not drinking while eating

## 2015-08-30 ENCOUNTER — Other Ambulatory Visit: Payer: Self-pay | Admitting: Oncology

## 2015-09-01 ENCOUNTER — Encounter: Payer: Self-pay | Admitting: Nurse Practitioner

## 2015-09-04 ENCOUNTER — Other Ambulatory Visit: Payer: Self-pay | Admitting: Oncology

## 2015-09-11 ENCOUNTER — Other Ambulatory Visit: Payer: Self-pay | Admitting: Oncology

## 2015-09-14 ENCOUNTER — Encounter: Payer: Medicare Other | Attending: General Surgery | Admitting: Dietician

## 2015-09-14 ENCOUNTER — Encounter: Payer: Self-pay | Admitting: Dietician

## 2015-09-14 DIAGNOSIS — D0512 Intraductal carcinoma in situ of left breast: Secondary | ICD-10-CM | POA: Diagnosis not present

## 2015-09-14 DIAGNOSIS — Z713 Dietary counseling and surveillance: Secondary | ICD-10-CM | POA: Insufficient documentation

## 2015-09-14 DIAGNOSIS — Z6841 Body Mass Index (BMI) 40.0 and over, adult: Secondary | ICD-10-CM | POA: Insufficient documentation

## 2015-09-14 NOTE — Progress Notes (Signed)
  Supervised Weight Loss:  Appt start time: 835 end time:  850  SWL visit 2:  Primary concerns today: Dierdre returns for her 2nd SWL appointment in preparation for bariatric surgery having lost 2.5 pounds in the last month. She has cut out sweet tea, sodas, ice cream, and fried foods. Has not yet tried lean frozen meals. However, she has been eating grilled fish and other lean meats.   Weight: 315.6 lbs BMI: 51  Goals: -Work on limiting drinks with added sugar (drink water or water with sugar free flavoring) -Avoid buying trigger foods like ice cream and chicken pot pies (focus on lean meats and vegetables)  -Try Lean Cuisine, Smart Ones, and veggie steamer bags   MEDICATIONS: see list  Recent physical activity: did not assess today  Estimated energy needs: 1400-1600 calories  Progress Towards Goal(s):  In progress.   Nutritional Diagnosis:  [place]-3.3 Overweight/obesity related to past poor dietary habits and physical inactivity as evidenced by patient in SWL for pending bariatric surgery following dietary guidelines for continued weight loss.     Intervention:  Nutrition counseling provided.   Monitoring/Evaluation:  Dietary intake, exercise, and body weight in 4 week(s).

## 2015-10-04 DIAGNOSIS — M7541 Impingement syndrome of right shoulder: Secondary | ICD-10-CM | POA: Diagnosis not present

## 2015-10-12 ENCOUNTER — Ambulatory Visit: Payer: Self-pay | Admitting: Dietician

## 2015-10-17 ENCOUNTER — Other Ambulatory Visit: Payer: Self-pay | Admitting: Oncology

## 2015-10-22 ENCOUNTER — Ambulatory Visit (INDEPENDENT_AMBULATORY_CARE_PROVIDER_SITE_OTHER): Payer: Medicare Other | Admitting: Family Medicine

## 2015-10-22 VITALS — BP 120/84 | Temp 97.4°F | Ht 63.5 in | Wt 309.0 lb

## 2015-10-22 DIAGNOSIS — F329 Major depressive disorder, single episode, unspecified: Secondary | ICD-10-CM | POA: Diagnosis not present

## 2015-10-22 DIAGNOSIS — F32A Depression, unspecified: Secondary | ICD-10-CM

## 2015-10-22 DIAGNOSIS — G4730 Sleep apnea, unspecified: Secondary | ICD-10-CM

## 2015-10-22 DIAGNOSIS — E785 Hyperlipidemia, unspecified: Secondary | ICD-10-CM

## 2015-10-22 DIAGNOSIS — D0510 Intraductal carcinoma in situ of unspecified breast: Secondary | ICD-10-CM

## 2015-10-22 DIAGNOSIS — I10 Essential (primary) hypertension: Secondary | ICD-10-CM | POA: Diagnosis not present

## 2015-10-22 DIAGNOSIS — Z23 Encounter for immunization: Secondary | ICD-10-CM | POA: Diagnosis not present

## 2015-10-22 MED ORDER — SIMVASTATIN 20 MG PO TABS: 20.0000 mg | ORAL_TABLET | Freq: Every evening | ORAL | Status: DC

## 2015-10-22 MED ORDER — LORAZEPAM 1 MG PO TABS: 1.0000 mg | ORAL_TABLET | Freq: Two times a day (BID) | ORAL | Status: DC | PRN

## 2015-10-22 MED ORDER — CARVEDILOL 25 MG PO TABS: ORAL_TABLET | ORAL | Status: DC

## 2015-10-22 MED ORDER — HYDROCODONE-HOMATROPINE 5-1.5 MG/5ML PO SYRP: ORAL_SOLUTION | ORAL | Status: DC

## 2015-10-22 MED ORDER — FUROSEMIDE 40 MG PO TABS: ORAL_TABLET | ORAL | Status: DC

## 2015-10-22 MED ORDER — TRAMADOL HCL 50 MG PO TABS: 50.0000 mg | ORAL_TABLET | Freq: Three times a day (TID) | ORAL | Status: DC | PRN

## 2015-10-22 MED ORDER — DIVALPROEX SODIUM ER 250 MG PO TB24: 250.0000 mg | ORAL_TABLET | Freq: Every day | ORAL | Status: DC

## 2015-10-22 MED ORDER — LOSARTAN POTASSIUM 100 MG PO TABS: ORAL_TABLET | ORAL | Status: DC

## 2015-10-22 MED ORDER — POTASSIUM CHLORIDE CRYS ER 20 MEQ PO TBCR: 20.0000 meq | EXTENDED_RELEASE_TABLET | Freq: Every day | ORAL | Status: DC

## 2015-10-22 NOTE — Progress Notes (Signed)
   Subjective:    Patient ID: Tamara [name], female    DOB: 09-27-1951, 64 y.o.   MRN: [id number]  HPI Tamara [name] is a 64 year old single female nonsmoker who comes in today for general physical examination because of a history of asthma, breast cancer, hypertension, anxiety, depression, hyperlipidemia, chronic pain  She's due to have gastric bypass surgery in either February or March of this coming year by Dr. Excell Seltzer  Vaccinations reviewed she was given Pneumovax 13  She gets routine eye care, dental care, BSE monthly, annual mammography, colonoscopy 2008 normal  Weight is now 309 pounds. 316 in November so she is trying to lose some weight.  She has her uterus and ovaries intact..... Pelvic and Pap last year normal therefore not repeated    Review of Systems  Constitutional: Negative.   HENT: Negative.   Eyes: Negative.   Respiratory: Negative.   Cardiovascular: Negative.   Gastrointestinal: Negative.   Endocrine: Negative.   Genitourinary: Negative.   Musculoskeletal: Negative.   Skin: Negative.   Allergic/Immunologic: Negative.   Neurological: Negative.   Hematological: Negative.   Psychiatric/Behavioral: Negative.        Objective:   Physical Exam  Constitutional: She appears well-developed and well-nourished.  HENT:  Head: Normocephalic and atraumatic.  Right Ear: External ear normal.  Left Ear: External ear normal.  Nose: Nose normal.  Mouth/Throat: Oropharynx is clear and moist.  Eyes: EOM are normal. Pupils are equal, round, and reactive to light.  Neck: Normal range of motion. Neck supple. No JVD present. No tracheal deviation present. No thyromegaly present.  Cardiovascular: Normal rate, regular rhythm, normal heart sounds and intact distal pulses.  Exam reveals no gallop and no friction rub.   No murmur heard. Pulmonary/Chest: Effort normal and breath sounds normal. No stridor. No respiratory distress. She has no wheezes. She has no rales. She exhibits no  tenderness.  Abdominal: Soft. Bowel sounds are normal. She exhibits no distension and no mass. There is no tenderness. There is no rebound and no guarding.  Genitourinary:  Bilateral breast exam normal  Musculoskeletal: Normal range of motion.  Lymphadenopathy:    She has no cervical adenopathy.  Neurological: She is alert. She has normal reflexes. No cranial nerve deficit. She exhibits normal muscle tone. Coordination normal.  Skin: Skin is warm and dry. No rash noted. No erythema. No pallor.  Psychiatric: She has a normal mood and affect. Her behavior is normal. Judgment and thought content normal.  Nursing note and vitals reviewed.         Assessment & Plan:Morbid obesity............Marland Kitchen morbid obesity  Morbid obesity........... continue follow-up with diet and exercise gastric bypass via Dr. Excell Seltzer this coming winter  .... Hypertension at goal........ continue current therapy  Hyperlipidemia........ continue current therapy  History of depression........ continue current therapy  Habits history of sleep apnea........ followed by pulmonary  History of breast cancer in remission........ followed by oncology

## 2015-10-22 NOTE — Progress Notes (Signed)
Pre visit review using our clinic review tool, if applicable. No additional management support is needed unless otherwise documented below in the visit note. 

## 2015-10-22 NOTE — Patient Instructions (Signed)
Continue current medications  Continue diet and exercise program. You've lost 7 pounds since October  Follow-up in one year's for your annual physical examination......... Tamara [name] or [place]

## 2015-11-09 ENCOUNTER — Encounter: Payer: Medicare Other | Attending: General Surgery | Admitting: Dietician

## 2015-11-09 ENCOUNTER — Encounter: Payer: Self-pay | Admitting: Dietician

## 2015-11-09 ENCOUNTER — Other Ambulatory Visit: Payer: Self-pay | Admitting: Oncology

## 2015-11-09 DIAGNOSIS — Z713 Dietary counseling and surveillance: Secondary | ICD-10-CM | POA: Diagnosis not present

## 2015-11-09 DIAGNOSIS — C50412 Malignant neoplasm of upper-outer quadrant of left female breast: Secondary | ICD-10-CM

## 2015-11-09 DIAGNOSIS — R232 Flushing: Secondary | ICD-10-CM

## 2015-11-09 DIAGNOSIS — F329 Major depressive disorder, single episode, unspecified: Secondary | ICD-10-CM

## 2015-11-09 DIAGNOSIS — Z6841 Body Mass Index (BMI) 40.0 and over, adult: Secondary | ICD-10-CM | POA: Diagnosis not present

## 2015-11-09 DIAGNOSIS — F32A Depression, unspecified: Secondary | ICD-10-CM

## 2015-11-09 NOTE — Patient Instructions (Signed)
Goals: -Avoid buying trigger foods like ice cream and chicken pot pies (focus on lean meats and vegetables)  -Try Lean Cuisine, Smart Ones, and veggie steamer bags -Start thinking about not drinking while eating   -Try some approved protein shakes

## 2015-11-09 NOTE — Progress Notes (Signed)
  Supervised Weight Loss:  Appt start time: 835 end time:  850  SWL visit 4:  Primary concerns today: Tamara [name] returns for her 4th SWL appointment in preparation for bariatric surgery having lost 6 pounds in the last month. Has continued to avoid sodas and fried foods. She has noticed that she has developed an aversion to fried foods after avoiding them for a while. She is still actively working on chewing thoroughly and eating slowly. Also working on not drinking while eating. She reports she is feeling excited about surgery.   Samples provided and patient instructed on proper use: Premier protein shake (chocolate - qty 2) Lot#: [id number] Exp: 08/2016   Weight: 309.6 lbs BMI: 50.1  Goals: -Avoid buying trigger foods like ice cream and chicken pot pies (focus on lean meats and vegetables)  -Try Lean Cuisine, Smart Ones, and veggie steamer bags -Start thinking about not drinking while eating  -Try some approved protein shakes   MEDICATIONS: see list  Recent physical activity: did not assess today  Estimated energy needs: 1400-1600 calories  Progress Towards Goal(s):  In progress.   Nutritional Diagnosis:  [place]-3.3 Overweight/obesity related to past poor dietary habits and physical inactivity as evidenced by patient in SWL for pending bariatric surgery following dietary guidelines for continued weight loss.     Intervention:  Nutrition counseling provided.   Monitoring/Evaluation:  Dietary intake, exercise, and body weight in 4 week(s).

## 2015-11-12 ENCOUNTER — Other Ambulatory Visit: Payer: Self-pay

## 2015-11-12 DIAGNOSIS — F32A Depression, unspecified: Secondary | ICD-10-CM

## 2015-11-12 DIAGNOSIS — R232 Flushing: Secondary | ICD-10-CM

## 2015-11-12 DIAGNOSIS — F329 Major depressive disorder, single episode, unspecified: Secondary | ICD-10-CM

## 2015-11-12 DIAGNOSIS — C50412 Malignant neoplasm of upper-outer quadrant of left female breast: Secondary | ICD-10-CM

## 2015-11-12 MED ORDER — VENLAFAXINE HCL ER 150 MG PO CP24: 150.0000 mg | ORAL_CAPSULE | Freq: Every day | ORAL | Status: DC

## 2015-11-28 ENCOUNTER — Other Ambulatory Visit: Payer: Self-pay | Admitting: Oncology

## 2015-11-29 ENCOUNTER — Other Ambulatory Visit: Payer: Self-pay | Admitting: *Deleted

## 2015-11-29 MED ORDER — VENLAFAXINE HCL ER 150 MG PO CP24: 150.0000 mg | ORAL_CAPSULE | Freq: Every day | ORAL | Status: DC

## 2015-11-29 NOTE — Telephone Encounter (Signed)
refill for effexor 37.5mg  received per surescripts.  Noted last prescription was for 150mg  extended release tablet.  Called and verified with pt current dose.  Pt states she is taking 2 tablets a day- this RN asked her to obtain bottle so mg dosage could be verified.  Per pt bottle states 150 mg.  Pt had was previously dispensed 75 mg tablets for total daily dose of 150 mg a day.  Last refill obtained was on 11/12/2015 with new mg of 150 tablets.  Per review with pharmacist here at this clinic this RN reviewed with pt need to decrease her dose to only 1 tablet a day.  Clarified with her dose per this office is 150 mg which now is supplied in 1 tablet.  Nkenge verbalized understanding of above dose.  This RN called pt's pharmacy, spoke with Alese Phd  and gave new instructions for next refill to state take 1 tablet a day with dispense amount of 30 tablets.

## 2015-12-07 ENCOUNTER — Encounter: Payer: Self-pay | Admitting: Skilled Nursing Facility1

## 2015-12-07 ENCOUNTER — Encounter: Payer: Medicare Other | Attending: General Surgery | Admitting: Skilled Nursing Facility1

## 2015-12-07 DIAGNOSIS — Z713 Dietary counseling and surveillance: Secondary | ICD-10-CM | POA: Diagnosis not present

## 2015-12-07 DIAGNOSIS — Z6841 Body Mass Index (BMI) 40.0 and over, adult: Secondary | ICD-10-CM | POA: Diagnosis not present

## 2015-12-07 NOTE — Progress Notes (Signed)
  Supervised Weight Loss:  Appt start time: 830 end time:  845  SWL visit 5:  Primary concerns today: Tamara [name] returns for her 5th SWL appointment in preparation for bariatric surgery having maintained wt since her last visit. Has continued to avoid sodas and fried foods. Pt states her friend insisted they go to [facility] where most of the foods are fried; she had a couple of bites of something and was disgusted with some GI issues later in the evening.  She is still actively working on chewing thoroughly and eating slowly. Also working on not drinking while eating. She reports she is feeling excited about surgery. Pt states the Holidays went well. Pt states she had a bad cold and was on a cough medicine which made her "loopy" during Thanksgiving. Pt states she is Watching what she eats so no ice cream, no soda. Pt states she Liked the protein shake. Pt states she does not eat chips anymore. Pt states she Had breast cancer 2 years ago and the perscription for that dx was wrong and she was taking too much but that issue has been resolved. Pt states she has trouble grocery shopping because of knee pain and at the store she wanted pot pie but just said no. Pt states she has tried lean cuisine and the protein shake and liked them.  Weight: 310 pounds BMI: 53.21  Goals: -Try to not drink while eating  -Find another protein shake as plan B in case after surgery her tastes have changed -Try some of the arm chair workouts (arm chair workout options were given)   MEDICATIONS: see list  Recent physical activity:  Hard to grocery shop because knees will not take it due to arthritis and previous surgeries but she does state she has been running around with her 15 month grandchild.   Estimated energy needs: 1400-1600 calories  Progress Towards Goal(s):  In progress.   Nutritional Diagnosis:  [place]-3.3 Overweight/obesity related to past poor dietary habits and physical inactivity as evidenced by patient in SWL  for pending bariatric surgery following dietary guidelines for continued weight loss.     Intervention:  Nutrition counseling provided.   Monitoring/Evaluation:  Dietary intake, exercise, and body weight in 4 week(s).

## 2015-12-19 ENCOUNTER — Other Ambulatory Visit: Payer: Self-pay | Admitting: General Surgery

## 2015-12-19 DIAGNOSIS — Z9889 Other specified postprocedural states: Secondary | ICD-10-CM

## 2016-01-04 ENCOUNTER — Encounter: Payer: Medicare Other | Attending: General Surgery | Admitting: Skilled Nursing Facility1

## 2016-01-04 ENCOUNTER — Encounter: Payer: Self-pay | Admitting: Skilled Nursing Facility1

## 2016-01-04 DIAGNOSIS — Z713 Dietary counseling and surveillance: Secondary | ICD-10-CM | POA: Insufficient documentation

## 2016-01-04 DIAGNOSIS — Z6841 Body Mass Index (BMI) 40.0 and over, adult: Secondary | ICD-10-CM | POA: Diagnosis not present

## 2016-01-04 NOTE — Progress Notes (Signed)
  Supervised Weight Loss:  Appt start time: 830 end time:  845  SWL visit 5:  Primary concerns today: Ciria returns for her 6th SWL appointment in preparation for bariatric surgery having gained 5 pounds since her last visit. Pt arrived moving very slowly due to a limp/knee pain, pt also had labored breathing. Pt states she Has continued to avoid sodas and fried foods. Pt had some questions about protein powders/shakes which were promptly addressed. Pt reports being ready for surgery. Pt states she has completed her insurance required number of supervised weight loss visits. Dietitian will communicate this completion with [facility].  Weight: 315 pounds BMI: 54.2  Goals: -Try to not drink while eating  -Find another protein shake as plan B in case after surgery her tastes have changed -Try some of the arm chair workouts (arm chair workout options were given)   MEDICATIONS: see list  Recent physical activity:  Hard to grocery shop because knees will not take it due to arthritis and previous surgeries but she does state she has been running around with her 15 month grandchild.   Estimated energy needs: 1400-1600 calories  Progress Towards Goal(s):  In progress.   Nutritional Diagnosis:  [place]-3.3 Overweight/obesity related to past poor dietary habits and physical inactivity as evidenced by patient in SWL for pending bariatric surgery following dietary guidelines for continued weight loss.     Intervention:  Nutrition counseling provided.   Monitoring/Evaluation:  Dietary intake, exercise, and body weight .

## 2016-01-14 DIAGNOSIS — Z713 Dietary counseling and surveillance: Secondary | ICD-10-CM | POA: Diagnosis not present

## 2016-01-14 DIAGNOSIS — Z6841 Body Mass Index (BMI) 40.0 and over, adult: Secondary | ICD-10-CM | POA: Diagnosis not present

## 2016-01-15 NOTE — Progress Notes (Signed)
  Pre-Operative Nutrition Class:  Appt start time: 2500   End time:  1830.  Patient was seen on 01/14/16 for Pre-Operative Bariatric Surgery Education at [facility].   Surgery date: 01/25/2016 Surgery type: sleeve gastrectomy Start weight at [facility]: 317 lbs on 07/13/2015 Weight today: 317.8 lbs  TANITA  BODY COMP RESULTS  01/14/16   BMI (kg/m^2) N/A   Fat Mass (lbs)    Fat Free Mass (lbs)    Total Body Water (lbs)    Samples given per MNT protocol. Patient educated on appropriate usage: Celebrate Multivitamin (black cherry - qty 1) Lot #: [id number] Exp: 07/2017  Celebrate Calcium Citrate chew (chocolate - qty 1) Lot #: [id number] Exp: 01/2017  Renee Pain (vanilla - qty 1) Lot #: [id number] Exp: 08/2016  PB2 (qty 1) Lot #: [id number] Exp: 08/2017  Premier Protein shake (chocolate - qty 1) Lot #: [id number] Exp: 08/2016  The following the learning objectives were met by the patient during this course:  Identify Pre-Op Dietary Goals and will begin 2 weeks pre-operatively  Identify appropriate sources of fluids and proteins   State protein recommendations and appropriate sources pre and post-operatively  Identify Post-Operative Dietary Goals and will follow for 2 weeks post-operatively  Identify appropriate multivitamin and calcium sources  Describe the need for physical activity post-operatively and will follow MD recommendations  State when to call healthcare provider regarding medication questions or post-operative complications  Handouts given during class include:  Pre-Op Bariatric Surgery Diet Handout  Protein Shake Handout  Post-Op Bariatric Surgery Nutrition Handout  [facility] Program Information Flyer  Support Group Information Flyer  [facility] Bariatric Supplements Price List  Follow-Up Plan: Patient will follow-up at [facility] 2 weeks post operatively for diet advancement per MD.

## 2016-01-16 ENCOUNTER — Other Ambulatory Visit: Payer: Self-pay | Admitting: General Surgery

## 2016-01-16 ENCOUNTER — Telehealth: Payer: Self-pay | Admitting: Family Medicine

## 2016-01-16 NOTE — Telephone Encounter (Addendum)
Pt is having sleeve (bariatric) surgery on Feb 27.  Pt states she was to let dr Sherren Mocha know.

## 2016-01-17 NOTE — Patient Instructions (Addendum)
Tamara [name]  01/17/2016   Your procedure is scheduled on: Monday 01-28-16  Report to [facility]  Entrance take [facility]  elevators to 3rd floor to  [place] at 1130  AM.  Call this number if you have problems the morning of surgery [phone]   Remember: ONLY 1 PERSON MAY GO WITH YOU TO SHORT STAY TO GET  READY MORNING OF [place].  Do not eat food  :After Midnight, clear liquids midnight until 730 am day of surgery, nothing by mouth after 730 am day of surgery.     Take these medicines the morning of surgery with A SIP OF WATER: ALBUTEROL INHALER IF NEEDED AND BRING INHALER, CARVEDILOL (COREG), VENLAFAXINE XR [facility] ER)   Bring CPAP mask and tubing with you day of surgery.                               You may not have any metal on your body including hair pins and              piercings  Do not wear jewelry, make-up, lotions, powders or perfumes, deodorant             Do not wear nail polish.  Do not shave  48 hours prior to surgery.              Men may shave face and neck.   Do not bring valuables to the hospital. [place].  Contacts, dentures or bridgework may not be worn into surgery.  Leave suitcase in the car. After surgery it may be brought to your room.     Special Instructions: N/A              Please read over the following fact sheets you were given: _____________________________________________________________________                CLEAR LIQUID DIET   Foods Allowed                                                                     Foods Excluded  Coffee and tea, regular and decaf                             liquids that you cannot  Plain Jell-O in any flavor                                             see through such as: Fruit ices (not with fruit pulp)                                     milk, soups, orange juice  Iced Popsicles  All solid food Carbonated beverages, regular and diet                                    Cranberry, grape and apple juices Sports drinks like Gatorade Lightly seasoned clear broth or consume(fat free) Sugar, honey syrup  Sample Menu Breakfast                                Lunch                                     Supper Cranberry juice                    Beef broth                            Chicken broth Jell-O                                     Grape juice                           Apple juice Coffee or tea                        Jell-O                                      Popsicle                                                Coffee or tea                        Coffee or tea  _____________________________________________________________________  [facility] - Preparing for Surgery Before surgery, you can play an important role.  Because skin is not sterile, your skin needs to be as free of germs as possible.  You can reduce the number of germs on your skin by washing with CHG (chlorahexidine gluconate) soap before surgery.  CHG is an antiseptic cleaner which kills germs and bonds with the skin to continue killing germs even after washing. Please DO NOT use if you have an allergy to CHG or antibacterial soaps.  If your skin becomes reddened/irritated stop using the CHG and inform your nurse when you arrive at Short Stay. Do not shave (including legs and underarms) for at least 48 hours prior to the first CHG shower.  You may shave your face/neck. Please follow these instructions carefully:  1.  Shower with CHG Soap the night before surgery and the  morning of Surgery.  2.  If you choose to wash your hair, wash your hair first as usual with your  normal  shampoo.  3.  After you shampoo, rinse your hair and body thoroughly to remove the  shampoo.  4.  Use CHG as you would any other liquid soap.  You can apply chg directly  to the skin and wash                        Gently with a scrungie or clean washcloth.  5.  Apply the CHG Soap to your body ONLY FROM THE NECK DOWN.   Do not use on face/ open                           Wound or open sores. Avoid contact with eyes, ears mouth and genitals (private parts).                       Wash face,  Genitals (private parts) with your normal soap.             6.  Wash thoroughly, paying special attention to the area where your surgery  will be performed.  7.  Thoroughly rinse your body with warm water from the neck down.  8.  DO NOT shower/wash with your normal soap after using and rinsing off  the CHG Soap.                9.  Pat yourself dry with a clean towel.            10.  Wear clean pajamas.            11.  Place clean sheets on your bed the night of your first shower and do not  sleep with pets. Day of Surgery : Do not apply any lotions/deodorants the morning of surgery.  Please wear clean clothes to the hospital/surgery center.  FAILURE TO FOLLOW THESE INSTRUCTIONS MAY RESULT IN THE CANCELLATION OF YOUR SURGERY PATIENT SIGNATURE_________________________________  NURSE SIGNATURE__________________________________  ________________________________________________________________________

## 2016-01-18 ENCOUNTER — Ambulatory Visit
Admission: RE | Admit: 2016-01-18 | Discharge: 2016-01-18 | Disposition: A | Discharge status: Home / Self Care | Payer: Medicare Other | Source: Ambulatory Visit | Attending: General Surgery | Admitting: General Surgery

## 2016-01-18 ENCOUNTER — Other Ambulatory Visit: Payer: Self-pay | Admitting: General Surgery

## 2016-01-18 ENCOUNTER — Encounter (HOSPITAL_COMMUNITY)
Admission: RE | Admit: 2016-01-18 | Discharge: 2016-01-18 | Disposition: A | Discharge status: Home / Self Care | Payer: Medicare Other | Source: Ambulatory Visit | Attending: General Surgery | Admitting: General Surgery

## 2016-01-18 ENCOUNTER — Encounter (HOSPITAL_COMMUNITY): Payer: Self-pay

## 2016-01-18 DIAGNOSIS — Z01812 Encounter for preprocedural laboratory examination: Secondary | ICD-10-CM | POA: Insufficient documentation

## 2016-01-18 DIAGNOSIS — Z9889 Other specified postprocedural states: Secondary | ICD-10-CM

## 2016-01-18 DIAGNOSIS — Z6841 Body Mass Index (BMI) 40.0 and over, adult: Secondary | ICD-10-CM | POA: Diagnosis not present

## 2016-01-18 DIAGNOSIS — R921 Mammographic calcification found on diagnostic imaging of breast: Secondary | ICD-10-CM | POA: Diagnosis not present

## 2016-01-18 HISTORY — DX: Bursitis of right shoulder: M75.51

## 2016-01-18 LAB — COMPREHENSIVE METABOLIC PANEL
ALK PHOS: 53 U/L (ref 38–126)
ALT: 32 U/L (ref 14–54)
ANION GAP: 11 (ref 5–15)
AST: 37 U/L (ref 15–41)
Albumin: 4 g/dL (ref 3.5–5.0)
BUN: 37 mg/dL — ABNORMAL HIGH (ref 6–20)
CALCIUM: 9.2 mg/dL (ref 8.9–10.3)
CO2: 26 mmol/L (ref 22–32)
Chloride: 101 mmol/L (ref 101–111)
Creatinine, Ser: 0.85 mg/dL (ref 0.44–1.00)
GFR calc non Af Amer: >60 mL/min (ref >60)
Glucose, Bld: 142 mg/dL — ABNORMAL HIGH (ref 65–99)
Potassium: 4.3 mmol/L (ref 3.5–5.1)
SODIUM: 138 mmol/L (ref 135–145)
TOTAL PROTEIN: 7.1 g/dL (ref 6.5–8.1)
Total Bilirubin: 0.6 mg/dL (ref 0.3–1.2)

## 2016-01-18 LAB — CBC WITH DIFFERENTIAL/PLATELET
BASOS ABS: 0 10*3/uL (ref 0.0–0.1)
BASOS PCT: 0 %
Eosinophils Absolute: 0.1 10*3/uL (ref 0.0–0.7)
Eosinophils Relative: 1 %
HEMATOCRIT: 42 % (ref 36.0–46.0)
Hemoglobin: 13.6 g/dL (ref 12.0–15.0)
LYMPHS PCT: 21 %
Lymphs Abs: 1.3 10*3/uL (ref 0.7–4.0)
MCH: 28.5 pg (ref 26.0–34.0)
MCHC: 32.4 g/dL (ref 30.0–36.0)
MCV: 88.1 fL (ref 78.0–100.0)
Monocytes Absolute: 0.6 10*3/uL (ref 0.1–1.0)
Monocytes Relative: 9 %
NEUTROS ABS: 4.3 10*3/uL (ref 1.7–7.7)
NEUTROS PCT: 69 %
Platelets: 207 10*3/uL (ref 150–400)
RBC: 4.77 MIL/uL (ref 3.87–5.11)
RDW: 13.5 % (ref 11.5–15.5)
WBC: 6.3 10*3/uL (ref 4.0–10.5)

## 2016-01-18 NOTE — Pre-Procedure Instructions (Addendum)
05-18-15 CXR, epic 05-18-15 EKG, 05-18-15  Bari bed requested for pt

## 2016-01-25 NOTE — Telephone Encounter (Signed)
Okay to work in

## 2016-01-25 NOTE — Telephone Encounter (Signed)
Pt states she is to see Dr Sherren Mocha 2 weeks after her surgery 2/27 for follow up. No appt. OK to work in?

## 2016-01-27 MED ORDER — DEXTROSE 5 % IV SOLN
3.0000 g | INTRAVENOUS | Status: AC
  Administered 2016-01-28: 3 g via INTRAVENOUS
  Filled 2016-01-27 (×2): qty 3000

## 2016-01-27 NOTE — H&P (Signed)
History of Present Illness Tamara [name] T. Bawi Lakins MD; 01/16/2016 3:17 PM) Patient words: reck.  The patient is a 65 year old female who presents with obesity. Patient returns for her preoperative visit prior to planned laparoscopic sleeve gastrectomy. She has battled obesity for many years with multiple attempts at nonsurgical weight loss which have been unsuccessful. She has completed her 6 month preoperative medical weight loss plan. She has multiple comorbidities including dyslipidemia, sleep apnea for which she uses a CPAP, hypertension and chronic knee pain. She is hoping to become more mobile and avoid worsening medical problems as she ages. We had previously completed her psych and nutrition evaluations which were unremarkable. Upper GI series was negative. Preoperative vitamin levels and lab work were unremarkable except for her dyslipidemia. She generally has been feeling okay with no new illnesses or infections or GI complaints currently.   Problem List/Past Medical Edward Jolly, MD; 01/16/2016 3:17 PM) DCIS (DUCTAL CARCINOMA IN SITU), LEFT (D05.12) MORBID OBESITY, UNSPECIFIED OBESITY TYPE (E66.01)  Other Problems Edward Jolly, MD; 01/16/2016 3:17 PM) Anxiety Disorder Arthritis Asthma Back Pain Breast Cancer Depression High blood pressure Hypercholesterolemia Lump In Breast Migraine Headache Sleep Apnea  Past Surgical History Edward Jolly, MD; 01/16/2016 3:17 PM) Breast Mass; Local Excision Left. Breast Biopsy Left. Oral Surgery Hysterectomy (not due to cancer) - Partial Colon Polyp Removal - Colonoscopy Knee Surgery Right.  Diagnostic Studies History Edward Jolly, MD; 01/16/2016 3:17 PM) Pap Smear 1-5 years ago Colonoscopy 1-5 years ago Mammogram within last year  Allergies Marjean Donna, [place]; 01/16/2016 2:59 PM) No Known Drug Allergies10/16/2015  Medication History Edward Jolly, MD; 01/16/2016 3:17  PM) Gabapentin (300MG  Capsule, Oral) Active. Venlafaxine HCl ER (150MG  Capsule ER 24HR, Oral) Active. Vitamin D (50000U Tablet, Oral daily) Active. LORazepam (1MG  Tablet, Oral daily) Active. Anastrozole (1MG  Tablet, Oral daily) Active. Carvedilol (25MG  Tablet, Oral daily) Active. CloNIDine HCl (0.1MG /24HR Patch Weekly, Transdermal daily) Active. Fluticasone Propionate (0.05% Cream, External as needed) Active. Ketoconazole (2% Cream, External as needed) Active. Klor-Con M20 [facility] Tablet ER, Oral daily) Active. Losartan Potassium (100MG  Tablet, Oral daily) Active. Simvastatin (20MG  Tablet, Oral daily) Active. Venlafaxine HCl ER (37.5MG  Capsule ER 24HR, Oral daily) Active. Ventolin HFA (108 (90 Base)MCG/ACT Aerosol Soln, Inhalation as needed) Active. Fluconazole (150MG  Tablet, Oral) Active. Divalproex Sodium ER (250MG  Tablet ER 24HR, Oral as needed) Active. TraMADol HCl (50MG  Tablet, Oral as needed) Active. Medications Reconciled OxyCODONE HCl (5MG /5ML Solution, 5-10 Milliliter Oral every four hours, as needed, Taken starting 01/16/2016) Active. Pantoprazole Sodium (40MG  Tablet DR, 1 (one) Tablet Oral daily, Taken starting 01/16/2016) Active. Zofran ODT (4MG  Tablet Disperse, 1 (one) Tablet Disperse Oral every six hours, as needed, Taken starting 01/16/2016) Active.  Social History Edward Jolly, MD; 01/16/2016 3:17 PM) Tobacco use Never smoker. No alcohol use No drug use Caffeine use Carbonated beverages, Coffee, Tea.  Family History Edward Jolly, MD; 01/16/2016 3:17 PM) Hypertension Brother, Family Members In General, Father, Mother. Anesthetic complications Family Members In General. Alcohol Abuse Family Members In General. Heart disease in female family member before age 14 [place], Father, Mother. Cancer Father. Depression Brother, Family Members In General, Father, Mother. Arthritis Family Members In General,  Mother. Thyroid problems Mother. Melanoma Family Members In General. Respiratory Condition Family Members In General, Father. Breast Cancer Family Members In General. Heart disease in female family member before age 72  Pregnancy / Birth History Edward Jolly, MD; 01/16/2016 3:17 PM) Age of menopause 9-60 Age at menarche  11 years. Maternal age 31-25 Gravida 3 Contraceptive History Oral contraceptives. Para 3  Vitals (Sonya Bynum CMA; 01/16/2016 2:58 PM) 01/16/2016 2:58 PM Weight: 316.4 lb Height: 66in Body Surface Area: 2.43 m Body Mass Index: 51.07 kg/m  Pulse: 79 (Regular)  BP: 136/76 (Sitting, Left Arm, Standard)       Physical Exam Tamara [name] T. Tiffani Kadow MD; 01/16/2016 3:19 PM) The physical exam findings are as follows: Note:General: Alert, morbidly obese Caucasian female, in no distress Skin: Warm and dry. Some chronic monilial rash beneath both breasts. HEENT: No palpable masses or thyromegaly. Sclera nonicteric. Pupils equal round and reactive. Lymph nodes: No cervical, supraclavicular nodes palpable Breasts: No palpable masses in either breast. Well-healed lumpectomy incision left. No palpable axillary adenopathy. Lungs: Breath sounds clear and equal. No wheezing or increased work of breathing. Cardiovascular: Regular rate and rhythm without murmer. No JVD or edema. Abdomen: Obese. Nondistended. Soft and nontender. No masses palpable. No organomegaly. No palpable hernias. Extremities: No edema or joint swelling or deformity. No chronic venous stasis changes. Neurologic: Alert and fully oriented. Gait normal. No focal weakness. Psychiatric: Normal mood and affect. Thought content appropriate with normal judgement and insight    Assessment & Plan Tamara [name] T. Kenlei Safi MD; 01/16/2016 3:21 PM) OBESITY, MORBID, BMI 50 OR HIGHER (E66.01) Impression: Marked morbid obesity with BMI 51, unresponsive to medical management, and multiple comorbidities  including obstructive sleep apnea, dyslipidemia, hypertension and degenerative joint disease. I believe she has significant potential benefit from surgical weight loss. She has successfully completed a workup for sleeve gastrectomy as above. We again reviewed the procedure in detail and reviewed the consent form and all her questions were answered. She is given prescriptions for pain and nausea medication and Protonix. I encouraged her to remain active and stay closely to her preoperative diet. Current Plans Schedule for Surgery  Laparoscopic sleeve gastrectomy

## 2016-01-28 ENCOUNTER — Inpatient Hospital Stay (HOSPITAL_COMMUNITY)
Admission: RE | Admit: 2016-01-28 | Discharge: 2016-01-30 | DRG: 621 | Disposition: A | Discharge status: Home / Self Care | Payer: Medicare Other | Source: Ambulatory Visit | Attending: General Surgery | Admitting: General Surgery

## 2016-01-28 ENCOUNTER — Inpatient Hospital Stay (HOSPITAL_COMMUNITY): Payer: Medicare Other | Admitting: Certified Registered Nurse Anesthetist

## 2016-01-28 ENCOUNTER — Encounter (HOSPITAL_COMMUNITY)
Admission: RE | Disposition: A | Discharge status: Home / Self Care | Payer: Self-pay | Source: Ambulatory Visit | Attending: General Surgery

## 2016-01-28 ENCOUNTER — Encounter (HOSPITAL_COMMUNITY): Payer: Self-pay | Admitting: *Deleted

## 2016-01-28 DIAGNOSIS — K2950 Unspecified chronic gastritis without bleeding: Secondary | ICD-10-CM | POA: Diagnosis not present

## 2016-01-28 DIAGNOSIS — Z8249 Family history of ischemic heart disease and other diseases of the circulatory system: Secondary | ICD-10-CM | POA: Diagnosis not present

## 2016-01-28 DIAGNOSIS — F419 Anxiety disorder, unspecified: Secondary | ICD-10-CM | POA: Diagnosis present

## 2016-01-28 DIAGNOSIS — Z79899 Other long term (current) drug therapy: Secondary | ICD-10-CM

## 2016-01-28 DIAGNOSIS — E785 Hyperlipidemia, unspecified: Secondary | ICD-10-CM | POA: Diagnosis present

## 2016-01-28 DIAGNOSIS — Z01812 Encounter for preprocedural laboratory examination: Secondary | ICD-10-CM

## 2016-01-28 DIAGNOSIS — I10 Essential (primary) hypertension: Secondary | ICD-10-CM | POA: Diagnosis present

## 2016-01-28 DIAGNOSIS — Z6841 Body Mass Index (BMI) 40.0 and over, adult: Secondary | ICD-10-CM

## 2016-01-28 DIAGNOSIS — F329 Major depressive disorder, single episode, unspecified: Secondary | ICD-10-CM | POA: Diagnosis present

## 2016-01-28 DIAGNOSIS — E669 Obesity, unspecified: Secondary | ICD-10-CM

## 2016-01-28 DIAGNOSIS — G8929 Other chronic pain: Secondary | ICD-10-CM | POA: Diagnosis present

## 2016-01-28 DIAGNOSIS — G4733 Obstructive sleep apnea (adult) (pediatric): Secondary | ICD-10-CM | POA: Diagnosis not present

## 2016-01-28 DIAGNOSIS — E66812 Obesity, class 2: Secondary | ICD-10-CM

## 2016-01-28 DIAGNOSIS — E7800 Pure hypercholesterolemia, unspecified: Secondary | ICD-10-CM | POA: Diagnosis present

## 2016-01-28 DIAGNOSIS — Z6836 Body mass index (BMI) 36.0-36.9, adult: Secondary | ICD-10-CM

## 2016-01-28 HISTORY — PX: LAPAROSCOPIC GASTRIC SLEEVE RESECTION: SHX5895

## 2016-01-28 LAB — HEMOGLOBIN AND HEMATOCRIT, BLOOD
HEMATOCRIT: 43.8 % (ref 36.0–46.0)
HEMOGLOBIN: 14.7 g/dL (ref 12.0–15.0)

## 2016-01-28 SURGERY — GASTRECTOMY, SLEEVE, LAPAROSCOPIC
Anesthesia: General | Site: Abdomen

## 2016-01-28 MED ORDER — SODIUM CHLORIDE 0.9 % IJ SOLN
INTRAMUSCULAR | Status: AC
  Filled 2016-01-28: qty 50

## 2016-01-28 MED ORDER — FENTANYL CITRATE (PF) 250 MCG/5ML IJ SOLN
INTRAMUSCULAR | Status: DC | PRN
  Administered 2016-01-28 (×5): 50 ug via INTRAVENOUS

## 2016-01-28 MED ORDER — BUPIVACAINE-EPINEPHRINE 0.25% -1:200000 IJ SOLN
INTRAMUSCULAR | Status: DC | PRN
  Administered 2016-01-28: 30 mL

## 2016-01-28 MED ORDER — DEXAMETHASONE SODIUM PHOSPHATE 10 MG/ML IJ SOLN
INTRAMUSCULAR | Status: AC
  Filled 2016-01-28: qty 1

## 2016-01-28 MED ORDER — ATROPINE SULFATE 0.4 MG/ML IJ SOLN
INTRAMUSCULAR | Status: AC
  Filled 2016-01-28: qty 1

## 2016-01-28 MED ORDER — 0.9 % SODIUM CHLORIDE (POUR BTL) OPTIME
TOPICAL | Status: DC | PRN
  Administered 2016-01-28: 1000 mL

## 2016-01-28 MED ORDER — GLYCOPYRROLATE 0.2 MG/ML IJ SOLN
INTRAMUSCULAR | Status: DC | PRN
  Administered 2016-01-28: 0.2 mg via INTRAVENOUS

## 2016-01-28 MED ORDER — MIDAZOLAM HCL 5 MG/5ML IJ SOLN
INTRAMUSCULAR | Status: DC | PRN
  Administered 2016-01-28: 2 mg via INTRAVENOUS

## 2016-01-28 MED ORDER — ACETAMINOPHEN 10 MG/ML IV SOLN
1000.0000 mg | Freq: Four times a day (QID) | INTRAVENOUS | Status: AC
  Administered 2016-01-28 – 2016-01-29 (×4): 1000 mg via INTRAVENOUS
  Filled 2016-01-28 (×5): qty 100

## 2016-01-28 MED ORDER — SODIUM CHLORIDE 0.9 % IJ SOLN
INTRAMUSCULAR | Status: AC
  Filled 2016-01-28: qty 10

## 2016-01-28 MED ORDER — BUPIVACAINE LIPOSOME 1.3 % IJ SUSP
20.0000 mL | Freq: Once | INTRAMUSCULAR | Status: AC
  Administered 2016-01-28: 20 mL
  Filled 2016-01-28: qty 20

## 2016-01-28 MED ORDER — CHLORHEXIDINE GLUCONATE CLOTH 2 % EX PADS: 6.0000 | MEDICATED_PAD | Freq: Once | CUTANEOUS | Status: DC

## 2016-01-28 MED ORDER — PANTOPRAZOLE SODIUM 40 MG IV SOLR
40.0000 mg | Freq: Every day | INTRAVENOUS | Status: DC
  Administered 2016-01-28 – 2016-01-29 (×2): 40 mg via INTRAVENOUS
  Filled 2016-01-28 (×3): qty 40

## 2016-01-28 MED ORDER — HYDROMORPHONE HCL 1 MG/ML IJ SOLN
INTRAMUSCULAR | Status: AC
  Filled 2016-01-28: qty 1

## 2016-01-28 MED ORDER — PROPOFOL 10 MG/ML IV BOLUS
INTRAVENOUS | Status: DC | PRN
  Administered 2016-01-28: 200 mg via INTRAVENOUS

## 2016-01-28 MED ORDER — BUPIVACAINE-EPINEPHRINE (PF) 0.25% -1:200000 IJ SOLN
INTRAMUSCULAR | Status: AC
  Filled 2016-01-28: qty 30

## 2016-01-28 MED ORDER — HYDROMORPHONE HCL 1 MG/ML IJ SOLN
0.2500 mg | INTRAMUSCULAR | Status: DC | PRN
  Administered 2016-01-28 (×4): 0.5 mg via INTRAVENOUS

## 2016-01-28 MED ORDER — MORPHINE SULFATE (PF) 2 MG/ML IV SOLN
2.0000 mg | INTRAVENOUS | Status: DC | PRN
  Administered 2016-01-28 (×2): 2 mg via INTRAVENOUS
  Administered 2016-01-29 (×4): 4 mg via INTRAVENOUS
  Filled 2016-01-28: qty 1
  Filled 2016-01-28: qty 2
  Filled 2016-01-28: qty 1
  Filled 2016-01-28 (×3): qty 2

## 2016-01-28 MED ORDER — METOPROLOL TARTRATE 1 MG/ML IV SOLN
5.0000 mg | Freq: Four times a day (QID) | INTRAVENOUS | Status: DC
  Administered 2016-01-30 (×2): 5 mg via INTRAVENOUS
  Filled 2016-01-28 (×11): qty 5

## 2016-01-28 MED ORDER — SUGAMMADEX SODIUM 200 MG/2ML IV SOLN
INTRAVENOUS | Status: DC | PRN
  Administered 2016-01-28: 300 mg via INTRAVENOUS

## 2016-01-28 MED ORDER — ACETAMINOPHEN 10 MG/ML IV SOLN
INTRAVENOUS | Status: AC
  Filled 2016-01-28: qty 100

## 2016-01-28 MED ORDER — POTASSIUM CHLORIDE IN NACL 20-0.9 MEQ/L-% IV SOLN
INTRAVENOUS | Status: AC
  Filled 2016-01-28: qty 1000

## 2016-01-28 MED ORDER — PREMIER PROTEIN SHAKE
2.0000 [oz_av] | Freq: Four times a day (QID) | ORAL | Status: DC
  Administered 2016-01-30 (×3): 2 [oz_av] via ORAL

## 2016-01-28 MED ORDER — ROCURONIUM BROMIDE 100 MG/10ML IV SOLN
INTRAVENOUS | Status: AC
  Filled 2016-01-28: qty 1

## 2016-01-28 MED ORDER — GLYCOPYRROLATE 0.2 MG/ML IJ SOLN
INTRAMUSCULAR | Status: AC
  Filled 2016-01-28: qty 1

## 2016-01-28 MED ORDER — PROPOFOL 10 MG/ML IV BOLUS
INTRAVENOUS | Status: AC
  Filled 2016-01-28: qty 20

## 2016-01-28 MED ORDER — LACTATED RINGERS IV SOLN: INTRAVENOUS | Status: DC

## 2016-01-28 MED ORDER — ACETAMINOPHEN 160 MG/5ML PO SOLN
325.0000 mg | ORAL | Status: DC | PRN
Start: 2016-01-29 — End: 2016-01-30

## 2016-01-28 MED ORDER — LIDOCAINE HCL (CARDIAC) 20 MG/ML IV SOLN
INTRAVENOUS | Status: AC
  Filled 2016-01-28: qty 5

## 2016-01-28 MED ORDER — ACETAMINOPHEN 160 MG/5ML PO SOLN: 650.0000 mg | ORAL | Status: DC | PRN

## 2016-01-28 MED ORDER — EPHEDRINE SULFATE 50 MG/ML IJ SOLN
INTRAMUSCULAR | Status: DC | PRN
  Administered 2016-01-28: 5 mg via INTRAVENOUS
  Administered 2016-01-28: 10 mg via INTRAVENOUS
  Administered 2016-01-28: 5 mg via INTRAVENOUS

## 2016-01-28 MED ORDER — ONDANSETRON HCL 4 MG/2ML IJ SOLN
4.0000 mg | INTRAMUSCULAR | Status: DC | PRN
  Administered 2016-01-28: 4 mg via INTRAVENOUS
  Filled 2016-01-28: qty 2

## 2016-01-28 MED ORDER — ONDANSETRON HCL 4 MG/2ML IJ SOLN
INTRAMUSCULAR | Status: DC | PRN
  Administered 2016-01-28: 4 mg via INTRAVENOUS

## 2016-01-28 MED ORDER — EVICEL 5 ML EX KIT
PACK | Freq: Once | CUTANEOUS | Status: AC
  Administered 2016-01-28: 5 mL
  Filled 2016-01-28: qty 1

## 2016-01-28 MED ORDER — ROCURONIUM BROMIDE 100 MG/10ML IV SOLN
INTRAVENOUS | Status: DC | PRN
  Administered 2016-01-28: 40 mg via INTRAVENOUS
  Administered 2016-01-28: 5 mg via INTRAVENOUS
  Administered 2016-01-28: 20 mg via INTRAVENOUS

## 2016-01-28 MED ORDER — MIDAZOLAM HCL 2 MG/2ML IJ SOLN
INTRAMUSCULAR | Status: AC
  Filled 2016-01-28: qty 2

## 2016-01-28 MED ORDER — PANTOPRAZOLE SODIUM 40 MG IV SOLR
40.0000 mg | INTRAVENOUS | Status: DC
  Filled 2016-01-28: qty 40

## 2016-01-28 MED ORDER — OXYCODONE HCL 5 MG/5ML PO SOLN: 5.0000 mg | ORAL | Status: DC | PRN

## 2016-01-28 MED ORDER — SUGAMMADEX SODIUM 500 MG/5ML IV SOLN
INTRAVENOUS | Status: AC
  Filled 2016-01-28: qty 5

## 2016-01-28 MED ORDER — FENTANYL CITRATE (PF) 100 MCG/2ML IJ SOLN
INTRAMUSCULAR | Status: AC
  Filled 2016-01-28: qty 2

## 2016-01-28 MED ORDER — FENTANYL CITRATE (PF) 100 MCG/2ML IJ SOLN
50.0000 ug | INTRAMUSCULAR | Status: DC | PRN
  Administered 2016-01-28: 50 ug via INTRAVENOUS

## 2016-01-28 MED ORDER — HEPARIN SODIUM (PORCINE) 5000 UNIT/ML IJ SOLN
5000.0000 [IU] | INTRAMUSCULAR | Status: AC
  Administered 2016-01-28: 5000 [IU] via SUBCUTANEOUS
  Filled 2016-01-28: qty 1

## 2016-01-28 MED ORDER — LACTATED RINGERS IR SOLN
Status: DC | PRN
  Administered 2016-01-28: 1000 mL

## 2016-01-28 MED ORDER — ATROPINE SULFATE 0.4 MG/ML IJ SOLN
INTRAMUSCULAR | Status: DC | PRN
  Administered 2016-01-28: 0.4 mg via INTRAVENOUS

## 2016-01-28 MED ORDER — LIDOCAINE HCL (CARDIAC) 20 MG/ML IV SOLN
INTRAVENOUS | Status: DC | PRN
  Administered 2016-01-28: 100 mg via INTRAVENOUS

## 2016-01-28 MED ORDER — LACTATED RINGERS IV SOLN
INTRAVENOUS | Status: DC | PRN
  Administered 2016-01-28 (×2): via INTRAVENOUS

## 2016-01-28 MED ORDER — SODIUM CHLORIDE 0.9 % IJ SOLN
INTRAMUSCULAR | Status: DC | PRN
  Administered 2016-01-28: 60 mL

## 2016-01-28 MED ORDER — ENOXAPARIN SODIUM 30 MG/0.3ML ~~LOC~~ SOLN
30.0000 mg | Freq: Two times a day (BID) | SUBCUTANEOUS | Status: DC
  Administered 2016-01-29 – 2016-01-30 (×3): 30 mg via SUBCUTANEOUS
  Filled 2016-01-28 (×5): qty 0.3

## 2016-01-28 MED ORDER — FENTANYL CITRATE (PF) 250 MCG/5ML IJ SOLN
INTRAMUSCULAR | Status: AC
  Filled 2016-01-28: qty 5

## 2016-01-28 MED ORDER — ONDANSETRON HCL 4 MG/2ML IJ SOLN
INTRAMUSCULAR | Status: AC
  Filled 2016-01-28: qty 2

## 2016-01-28 MED ORDER — POTASSIUM CHLORIDE IN NACL 20-0.9 MEQ/L-% IV SOLN
INTRAVENOUS | Status: DC
  Administered 2016-01-28: 17:00:00 via INTRAVENOUS
  Administered 2016-01-29: 1000 mL via INTRAVENOUS
  Administered 2016-01-30: via INTRAVENOUS
  Filled 2016-01-28 (×5): qty 1000

## 2016-01-28 MED ORDER — SUCCINYLCHOLINE 20MG/ML (10ML) SYRINGE FOR MEDFUSION PUMP - OPTIME
INTRAMUSCULAR | Status: DC | PRN
  Administered 2016-01-28: 45 mg via INTRAVENOUS
  Administered 2016-01-28: 100 mg via INTRAVENOUS

## 2016-01-28 MED ORDER — ACETAMINOPHEN 10 MG/ML IV SOLN
1000.0000 mg | Freq: Once | INTRAVENOUS | Status: AC
  Administered 2016-01-28: 1000 mg via INTRAVENOUS

## 2016-01-28 SURGICAL SUPPLY — 60 items
APPLICATOR COTTON TIP 6IN STRL (MISCELLANEOUS) ×12 IMPLANT
APPLIER CLIP ROT 10 11.4 M/L (STAPLE)
APPLIER CLIP ROT 13.4 12 LRG (CLIP)
APR CLP LRG 13.4X12 ROT 20 MLT (CLIP)
APR CLP MED LRG 11.4X10 (STAPLE)
BAG SPEC RTRVL LRG 6X4 10 (ENDOMECHANICALS)
BLADE SURG SZ11 CARB STEEL (BLADE) ×2 IMPLANT
CABLE HIGH FREQUENCY MONO STRZ (ELECTRODE) ×2 IMPLANT
CHLORAPREP W/TINT 26ML (MISCELLANEOUS) ×3 IMPLANT
CLIP APPLIE ROT 10 11.4 M/L (STAPLE) IMPLANT
CLIP APPLIE ROT 13.4 12 LRG (CLIP) IMPLANT
COVER SURGICAL LIGHT HANDLE (MISCELLANEOUS) IMPLANT
DEVICE PMI PUNCTURE CLOSURE (MISCELLANEOUS) ×2 IMPLANT
DEVICE SUT QUICK LOAD TK 5 (STAPLE) IMPLANT
DEVICE SUT TI-KNOT TK 5X26 (MISCELLANEOUS) IMPLANT
DEVICE SUTURE ENDOST 10MM (ENDOMECHANICALS) IMPLANT
DRAPE UTILITY XL STRL (DRAPES) ×4 IMPLANT
ELECT REM PT RETURN 9FT ADLT (ELECTROSURGICAL) ×2
ELECTRODE REM PT RTRN 9FT ADLT (ELECTROSURGICAL) ×1 IMPLANT
GAUZE SPONGE 4X4 12PLY STRL (GAUZE/BANDAGES/DRESSINGS) IMPLANT
GLOVE BIOGEL PI IND STRL 7.5 (GLOVE) ×1 IMPLANT
GLOVE BIOGEL PI INDICATOR 7.5 (GLOVE) ×1
GLOVE ECLIPSE 7.5 STRL STRAW (GLOVE) ×2 IMPLANT
GOWN STRL REUS W/TWL XL LVL3 (GOWN DISPOSABLE) ×8 IMPLANT
HOVERMATT SINGLE USE (MISCELLANEOUS) ×2 IMPLANT
KIT BASIN OR (CUSTOM PROCEDURE TRAY) ×2 IMPLANT
LIQUID BAND (GAUZE/BANDAGES/DRESSINGS) ×2 IMPLANT
MARKER SKIN DUAL TIP RULER LAB (MISCELLANEOUS) ×2 IMPLANT
NEEDLE SPNL 22GX3.5 QUINCKE BK (NEEDLE) ×2 IMPLANT
PACK UNIVERSAL I (CUSTOM PROCEDURE TRAY) ×2 IMPLANT
POUCH SPECIMEN RETRIEVAL 10MM (ENDOMECHANICALS) IMPLANT
RELOAD STAPLE 60 3.8 GOLD REG (STAPLE) ×1 IMPLANT
RELOAD STAPLER BLUE 60MM (STAPLE) ×4 IMPLANT
RELOAD STAPLER GOLD 60MM (STAPLE) ×2 IMPLANT
RELOAD STAPLER GREEN 60MM (STAPLE) ×1 IMPLANT
SCISSORS LAP 5X45 EPIX DISP (ENDOMECHANICALS) ×2 IMPLANT
SET IRRIG TUBING LAPAROSCOPIC (IRRIGATION / IRRIGATOR) ×2 IMPLANT
SHEARS HARMONIC ACE PLUS 45CM (MISCELLANEOUS) ×2 IMPLANT
SLEEVE ADV FIXATION 5X100MM (TROCAR) ×2 IMPLANT
SLEEVE GASTRECTOMY 36FR VISIGI (MISCELLANEOUS) ×2 IMPLANT
SOLUTION ANTI FOG 6CC (MISCELLANEOUS) ×2 IMPLANT
SPONGE LAP 18X18 X RAY DECT (DISPOSABLE) ×2 IMPLANT
STAPLER ECHELON LONG 60 440 (INSTRUMENTS) ×2 IMPLANT
STAPLER RELOAD BLUE 60MM (STAPLE) ×8
STAPLER RELOAD GOLD 60MM (STAPLE) ×4
STAPLER RELOAD GREEN 60MM (STAPLE) ×2
SUT DEVICE BRAIDED 0X39 (SUTURE) IMPLANT
SUT MNCRL AB 4-0 PS2 18 (SUTURE) ×2 IMPLANT
SUT VICRYL 0 TIES 12 18 (SUTURE) ×2 IMPLANT
SYR 10ML ECCENTRIC (SYRINGE) ×2 IMPLANT
SYR 20CC LL (SYRINGE) ×2 IMPLANT
TIP RIGID 35CM EVICEL (HEMOSTASIS) ×2 IMPLANT
TOWEL OR 17X26 10 PK STRL BLUE (TOWEL DISPOSABLE) ×2 IMPLANT
TOWEL OR NON WOVEN STRL DISP B (DISPOSABLE) ×2 IMPLANT
TROCAR ADV FIXATION 5X100MM (TROCAR) ×2 IMPLANT
TROCAR BLADELESS 15MM (ENDOMECHANICALS) ×2 IMPLANT
TROCAR BLADELESS OPT 5 100 (ENDOMECHANICALS) ×2 IMPLANT
TUBING CONNECTING 10 (TUBING) ×2 IMPLANT
TUBING ENDO SMARTCAP PENTAX (MISCELLANEOUS) ×2 IMPLANT
TUBING INSUF HEATED (TUBING) ×2 IMPLANT

## 2016-01-28 NOTE — Transfer of Care (Signed)
Immediate Anesthesia Transfer of Care Note  Patient: Tamara [name]  Procedure(s) Performed: Procedure(s): LAPAROSCOPIC GASTRIC SLEEVE RESECTION WITH UPPER ENDO (N/A)  Patient Location: PACU  Anesthesia Type:General  Level of Consciousness:  sedated, patient cooperative and responds to stimulation  Airway & Oxygen Therapy:Patient Spontanous Breathing and Patient connected to face mask oxgen  Post-op Assessment:  Report given to PACU RN and Post -op Vital signs reviewed and stable  Post vital signs:  Reviewed and stable  Last Vitals:  Filed Vitals:   01/28/16 1124  BP: 132/72  Pulse: 58  Temp: 36.6 C  Resp: 18    Complications: No apparent anesthesia complications

## 2016-01-28 NOTE — Progress Notes (Signed)
Discussed blood pressure 211/84 and pulse in 50's with Dr Lucia Gaskins.  Instructed not to give lopressor with pulse less than 60. But to try to get pain under better control and believes pressure will come down.

## 2016-01-28 NOTE — Anesthesia Procedure Notes (Signed)
Procedure Name: Intubation Date/Time: 01/28/2016 1:53 PM Performed by: West Pugh Pre-anesthesia Checklist: Patient identified, Emergency Drugs available, Suction available, Patient being monitored and Timeout performed Patient Re-evaluated:Patient Re-evaluated prior to inductionOxygen Delivery Method: Circle system utilized Preoxygenation: Pre-oxygenation with 100% oxygen Intubation Type: IV induction Ventilation: Mask ventilation without difficulty Laryngoscope Size: 3 and Glidescope Grade View: Grade II Tube type: Oral Tube size: 7.0 mm Number of attempts: 2 Airway Equipment and Method: Stylet Placement Confirmation: ETT inserted through vocal cords under direct vision,  positive ETCO2,  CO2 detector and breath sounds checked- equal and bilateral Secured at: 22 cm Tube secured with: Tape Dental Injury: Teeth and Oropharynx as per pre-operative assessment  Difficulty Due To: Difficulty was anticipated Comments: DL x1 attempt with MAC #3 unsuccessful placement, DL x1 with glidescope and grade 1 view, ETT place with ease. Easy mask ventilation

## 2016-01-28 NOTE — Anesthesia Preprocedure Evaluation (Addendum)
Anesthesia Evaluation  Patient identified by MRN, date of birth, ID band Patient awake    Reviewed: Allergy & Precautions, H&P , NPO status , Patient's Chart, lab work & pertinent test results  Airway Mallampati: III  TM Distance: >3 FB Neck ROM: full    Dental no notable dental hx. (+) Dental Advisory Given, Teeth Intact   Pulmonary sleep apnea ,    Pulmonary exam normal breath sounds clear to auscultation       Cardiovascular hypertension, Pt. on home beta blockers and Pt. on medications Normal cardiovascular exam Rhythm:regular Rate:Normal     Neuro/Psych negative neurological ROS  negative psych ROS   GI/Hepatic negative GI ROS, Neg liver ROS,   Endo/Other  Morbid obesity  Renal/GU negative Renal ROS  negative genitourinary   Musculoskeletal   Abdominal   Peds  Hematology negative hematology ROS (+)   Anesthesia Other Findings   Reproductive/Obstetrics negative OB ROS                            Anesthesia Physical Anesthesia Plan  ASA: III  Anesthesia Plan: General   Post-op Pain Management:    Induction: Intravenous  Airway Management Planned: Oral ETT  Additional Equipment:   Intra-op Plan:   Post-operative Plan: Extubation in OR  Informed Consent: I have reviewed the patients History and Physical, chart, labs and discussed the procedure including the risks, benefits and alternatives for the proposed anesthesia with the patient or authorized representative who has indicated his/her understanding and acceptance.   Dental Advisory Given  Plan Discussed with: CRNA and Surgeon  Anesthesia Plan Comments:         Anesthesia Quick Evaluation                                  Anesthesia Evaluation  Patient identified by MRN, date of birth, ID band Patient awake    Reviewed: Allergy & Precautions, H&P , NPO status , Patient's Chart, lab work & pertinent test  results, reviewed documented beta blocker date and time   Airway Mallampati: II TM Distance: >3 FB Neck ROM: Full    Dental  (+) Dental Advisory Given, Teeth Intact   Pulmonary asthma , sleep apnea and Continuous Positive Airway Pressure Ventilation ,          Cardiovascular hypertension, Pt. on home beta blockers and Pt. on medications     Neuro/Psych PSYCHIATRIC DISORDERS Depression    GI/Hepatic   Endo/Other  Morbid obesity  Renal/GU      Musculoskeletal  (+) Arthritis -,   Abdominal   Peds  Hematology   Anesthesia Other Findings   Reproductive/Obstetrics                         Anesthesia Physical Anesthesia Plan  ASA: III  Anesthesia Plan: General   Post-op Pain Management:    Induction: Intravenous  Airway Management Planned: LMA  Additional Equipment:   Intra-op Plan:   Post-operative Plan: Extubation in OR  Informed Consent: I have reviewed the patients History and Physical, chart, labs and discussed the procedure including the risks, benefits and alternatives for the proposed anesthesia with the patient or authorized representative who has indicated his/her understanding and acceptance.   Dental advisory given  Plan Discussed with: CRNA and Surgeon  Anesthesia Plan Comments:  Anesthesia Quick Evaluation

## 2016-01-28 NOTE — Anesthesia Postprocedure Evaluation (Signed)
Anesthesia Post Note  Patient: Tamara [name]  Procedure(s) Performed: Procedure(s) (LRB): LAPAROSCOPIC GASTRIC SLEEVE RESECTION WITH UPPER ENDO (N/A)  Patient location during evaluation: PACU Anesthesia Type: General Level of consciousness: awake and alert Pain management: pain level controlled Vital Signs Assessment: post-procedure vital signs reviewed and stable Respiratory status: spontaneous breathing, nonlabored ventilation, respiratory function stable and patient connected to nasal cannula oxygen Cardiovascular status: blood pressure returned to baseline and stable Postop Assessment: no signs of nausea or vomiting Anesthetic complications: no    Last Vitals:  Filed Vitals:   01/28/16 1645 01/28/16 1700  BP: 189/91 198/85  Pulse: 57 57  Temp:  36.5 C  Resp: 28 28    Last Pain:  Filed Vitals:   01/28/16 1705  PainSc: 9                  Fredrick Dray L

## 2016-01-28 NOTE — Interval H&P Note (Signed)
History and Physical Interval Note:  01/28/2016 1:40 PM  Tamara [name]  has presented today for surgery, with the diagnosis of MORBID OBESITY  The various methods of treatment have been discussed with the patient and family. After consideration of risks, benefits and other options for treatment, the patient has consented to  Procedure(s): LAPAROSCOPIC GASTRIC SLEEVE RESECTION WITH UPPER ENDO (N/A) as a surgical intervention .  The patient's history has been reviewed, patient examined, no change in status, stable for surgery.  I have reviewed the patient's chart and labs.  Questions were answered to the patient's satisfaction.     [name]

## 2016-01-28 NOTE — Progress Notes (Signed)
RT placed patient on CPAP. Patient setting is 5-18 cmH2O. Sterile water added to water chamber for humidification. Patient is tolerating well. RT will continue to monitor.

## 2016-01-28 NOTE — Op Note (Signed)
Preoperative Diagnosis: MORBID OBESITY  Postoprative Diagnosis: MORBID OBESITY  Procedure: Procedure(s): LAPAROSCOPIC GASTRIC SLEEVE RESECTION WITH UPPER ENDO   Surgeon: Excell Seltzer T   Assistants: Gurney Maxin  Anesthesia:  General endotracheal anesthesia  Indications: Patient is a 65 year old female with progressive morbid obesity unresponsive to multiple attempts at medical management who presents at a BMI of 51 with comorbidities of obstructive sleep apnea, hypertension, dyslipidemia and degenerative joint disease. After extensive preoperative workup and discussion detailed elsewhere we have elected to proceed with laparoscopic sleeve gastrectomy for surgical treatment of her morbid obesity.  Procedure Detail:  Patient was brought to the operating room, placed in the supine position on the operating table, and general endotracheal anesthesia induced. She had received preoperative IV antibiotics and subcutaneous heparin. PAS were in place. The abdomen was widely sterilely prepped and draped. Patient timeout was performed and correct patient and procedure verified. Access was obtained with a 5 mm Optiview trocar in the left upper quadrant without difficulty and pneumoperitoneum established. Under direct vision a 5 mm trocar was placed laterally in the right upper quadrant, a 15 mm trocar through base of the falciform ligament, 5 mm trocar above and to the left of the umbilicus for the camera port. Through a 5 mm subxiphoid site the [facility] retractor was placed in the left lobe of the liver elevated with excellent exposure to the stomach and the hiatus. Finally a 5 mm trocar was placed laterally in the left upper quadrant. Beginning at the mid greater curve vasculature was dissected adjacent to the stomach with the Harmonic scalpel and the lesser sac entered without difficulty. The dissection progressed proximally along the greater curve dividing short gastric vessels with the Harmonic  scalpel. Continuing proximally a few fairly loose adhesions to the spleen were taken down with Harmonic scalpel. The dissection was carried back to the left crus which was completely dissected up to the hiatus and the esophageal fat pad mobilized. Working back distally some fairly extensive avascular attachments to the pancreas were divided with Harmonic scalpel and sharp dissection. The greater curve was then dissected distally with Harmonic scalpel until we were at a point measured 5 cm from the pylorus. At this point we confirmed the stomach was completely free on its lesser curve vasculature. The VisiG 81 French tube was passed orally and positioned with its tip at the pylorus along the lesser curve and then placed on continuous suction. An initial firing of the 60 mm green load echelon stapler was performed at 5 cm from the pylorus and going up to but staying well away from the incisura. A 60 mm gold load was then used to continue the sleeve passed the incisura allowing if this point some extra room adjacent to the VisiG tube. Following this several firings of the 60 mm blue load stapler staying close to the VisiG tube were used working proximally to complete the sleeve with the last firing angling out just lateral to the esophageal fat pad. The sleeve was insufflated under saline irrigation evidence of leak. It was symmetrical with no twisting or scalloping. The VisiG T-tube was removed. Dr. Kieth Brightly performed upper endoscopy again showing asymmetrical sleeve with no stricturing, no bleeding and no leakage under irrigation. The sleeve was decompressed. The staple line was coated with Evicel tissue sealant. There was no bleeding. I had placed a few large clips along the staple line prior to the endoscopy. The sleeve was removed through the 15 mm trocar site after dilating this slightly. The fascial  defect here was closed with 0 Vicryl using the Endo Close. An abdominal block was then performed bilaterally  withExparel local anesthesia diluted to 80 mL. The Nathanson retractor was removed under direct vision and all CO2 evacuated and trochars removed. Skin incisions were closed with subcuticular 5-0 Monocryl and Liquiban. Sponge needle and instrument counts were correct.    Findings: As above  Estimated Blood Loss:  less than 50 mL         Drains: none  Blood Given: none          Specimens: Greater curvature of stomach        Complications:  * No complications entered in OR log *         Disposition: PACU - hemodynamically stable.         Condition: stable

## 2016-01-28 NOTE — Progress Notes (Signed)
Utilization review completed.  

## 2016-01-29 ENCOUNTER — Encounter (HOSPITAL_COMMUNITY): Payer: Self-pay | Admitting: General Surgery

## 2016-01-29 LAB — CBC WITH DIFFERENTIAL/PLATELET
BASOS ABS: 0 10*3/uL (ref 0.0–0.1)
Basophils Relative: 0 %
EOS ABS: 0 10*3/uL (ref 0.0–0.7)
Eosinophils Relative: 0 %
HCT: 42.8 % (ref 36.0–46.0)
HEMOGLOBIN: 14.4 g/dL (ref 12.0–15.0)
LYMPHS ABS: 1 10*3/uL (ref 0.7–4.0)
Lymphocytes Relative: 15 %
MCH: 29 pg (ref 26.0–34.0)
MCHC: 33.6 g/dL (ref 30.0–36.0)
MCV: 86.3 fL (ref 78.0–100.0)
Monocytes Absolute: 0.3 10*3/uL (ref 0.1–1.0)
Monocytes Relative: 5 %
NEUTROS PCT: 80 %
Neutro Abs: 5.1 10*3/uL (ref 1.7–7.7)
Platelets: 195 10*3/uL (ref 150–400)
RBC: 4.96 MIL/uL (ref 3.87–5.11)
RDW: 13.2 % (ref 11.5–15.5)
WBC: 6.4 10*3/uL (ref 4.0–10.5)

## 2016-01-29 LAB — HEMOGLOBIN AND HEMATOCRIT, BLOOD
HCT: 42.9 % (ref 36.0–46.0)
HEMOGLOBIN: 14.1 g/dL (ref 12.0–15.0)

## 2016-01-29 NOTE — Progress Notes (Signed)
Patient ID: Tamara [name], female   DOB: Nov 22, 1951, 65 y.o.   MRN: [id number] 1 Day Post-Op  Subjective: Some soreness across chest, pain last night has resolved.  No nausea.  Getting up frequently to bathroom.  Objective: Vital signs in last 24 hours: Temp:  [97.6 F (36.4 C)-98.4 F (36.9 C)] 98.4 F (36.9 C) (02/28 0512) Pulse Rate:  [57-65] 59 (02/28 0512) Resp:  [12-29] 14 (02/28 0109) BP: (132-211)/(65-91) 159/67 mmHg (02/28 0512) SpO2:  [90 %-100 %] 99 % (02/28 0512) Weight:  [139.027 kg (306 lb 8 oz)] 139.027 kg (306 lb 8 oz) (02/27 1135)    Intake/Output from previous day: 02/27 0701 - 02/28 0700 In: 2988.3 [I.V.:2938.3; IV Piggyback:50] Out: 1700 [Urine:1700] Intake/Output this shift: Total I/O In: 938.3 [I.V.:938.3] Out: 1250 [Urine:1250]  General appearance: alert, cooperative and no distress Resp: clear to auscultation bilaterally GI: normal findings: soft, non-tender Incision/Wound: Clean and dry  Lab Results:   Recent Labs  01/28/16 2021 01/29/16 0357  WBC  --  6.4  HGB 14.7 14.4  HCT 43.8 42.8  PLT  --  195   BMET No results for input(s): NA, K, CL, CO2, GLUCOSE, BUN, CREATININE, CALCIUM in the last 72 hours.   Studies/Results: No results found.  Anti-infectives: Anti-infectives    Start     Dose/Rate Route Frequency Ordered Stop   01/28/16 0600  ceFAZolin (ANCEF) 3 g in dextrose 5 % 50 mL IVPB     3 g 130 mL/hr over 30 Minutes Intravenous On call to O.R. 01/27/16 1307 01/28/16 1403      Assessment/Plan: s/p Procedure(s): LAPAROSCOPIC GASTRIC SLEEVE RESECTION WITH UPPER ENDO Doing well post op without apparent complication Start POD 1 diet   LOS: 1 day    Vertis Scheib T 01/29/2016

## 2016-01-29 NOTE — Discharge Instructions (Addendum)

## 2016-01-29 NOTE — Progress Notes (Signed)
Patient alert and oriented, Post op day 1.  Provided support and encouragement.  Encouraged pulmonary toilet, ambulation and small sips of liquids.  All questions answered.  Will continue to monitor. 

## 2016-01-29 NOTE — Plan of Care (Signed)
Problem: Food- and Nutrition-Related Knowledge Deficit (NB-1.1) Goal: Nutrition education Formal process to instruct or train a patient/client in a skill or to impart knowledge to help patients/clients voluntarily manage or modify food choices and eating behavior to maintain or improve health. Outcome: Completed/Met Date Met:  01/29/16 Nutrition Education Note  Received consult for diet education per DROP protocol.   Discussed 2 week post op diet with pt. Emphasized that liquids must be non carbonated, non caffeinated, and sugar free. Reviewed soft protein foods that can be added to diet 7-10 days post-op. Fluid goals discussed. Pt to follow up with outpatient bariatric RD for further diet progression after 2 weeks. Multivitamins and minerals also reviewed. Teach back method used, pt expressed understanding, expect good compliance.   Diet: First 2 Weeks  You will see the dietitian about two (2) weeks after your surgery. The dietitian will increase the types of foods you can eat if you are handling liquids well:  If you have severe vomiting or nausea and cannot handle clear liquids lasting longer than 1 day, call your surgeon  Protein Shake  Drink at least 2 ounces of shake 5-6 times per day  Each serving of protein shakes (usually 8 - 12 ounces) should have a minimum of:  15 grams of protein  And no more than 5 grams of carbohydrate  Goal for protein each day:  Men = 80 grams per day  Women = 60 grams per day  Protein powder may be added to fluids such as non-fat milk or Lactaid milk or Soy milk (limit to 35 grams added protein powder per serving)   Hydration  Slowly increase the amount of water and other clear liquids as tolerated (See Acceptable Fluids)  Slowly increase the amount of protein shake as tolerated  Sip fluids slowly and throughout the day  May use sugar substitutes in small amounts (no more than 6 - 8 packets per day; i.e. Splenda)   Fluid Goal  The first goal is to  drink at least 8 ounces of protein shake/drink per day (or as directed by the nutritionist); some examples of protein shakes are [employer], [employer], EAS Edge HP, and Unjury. See handout from pre-op Bariatric Education Class:  Slowly increase the amount of protein shake you drink as tolerated  You may find it easier to slowly sip shakes throughout the day  It is important to get your proteins in first  Your fluid goal is to drink 64 - 100 ounces of fluid daily  It may take a few weeks to build up to this  32 oz (or more) should be clear liquids  And  32 oz (or more) should be full liquids (see below for examples)  Liquids should not contain sugar, caffeine, or carbonation   Clear Liquids:  Water or Sugar-free flavored water (i.e. Fruit H2O, Propel)  Decaffeinated coffee or tea (sugar-free)  Crystal Lite, Wyler's Lite, Minute Maid Lite  Sugar-free Jell-O  Bouillon or broth  Sugar-free Popsicle: *Less than 20 calories each; Limit 1 per day   Full Liquids:  Protein Shakes/Drinks + 2 choices per day of other full liquids  Full liquids must be:  No More Than 12 grams of Carbs per serving  No More Than 3 grams of Fat per serving  Strained low-fat cream soup  Non-Fat milk  Fat-free Lactaid Milk  Sugar-free yogurt (Dannon Lite & Fit, Greek yogurt)     Tamara Bibles, MS, RD, LDN Pager: [phone] After Hours Pager: [phone]

## 2016-01-30 LAB — CBC WITH DIFFERENTIAL/PLATELET
BASOS PCT: 0 %
Basophils Absolute: 0 10*3/uL (ref 0.0–0.1)
Eosinophils Absolute: 0 10*3/uL (ref 0.0–0.7)
Eosinophils Relative: 0 %
HEMATOCRIT: 43.3 % (ref 36.0–46.0)
HEMOGLOBIN: 14.5 g/dL (ref 12.0–15.0)
LYMPHS ABS: 1.8 10*3/uL (ref 0.7–4.0)
Lymphocytes Relative: 17 %
MCH: 29.2 pg (ref 26.0–34.0)
MCHC: 33.5 g/dL (ref 30.0–36.0)
MCV: 87.1 fL (ref 78.0–100.0)
MONO ABS: 1.1 10*3/uL — AB (ref 0.1–1.0)
MONOS PCT: 11 %
NEUTROS ABS: 7.9 10*3/uL — AB (ref 1.7–7.7)
NEUTROS PCT: 72 %
Platelets: 199 10*3/uL (ref 150–400)
RBC: 4.97 MIL/uL (ref 3.87–5.11)
RDW: 13.5 % (ref 11.5–15.5)
WBC: 10.8 10*3/uL — ABNORMAL HIGH (ref 4.0–10.5)

## 2016-01-30 NOTE — Progress Notes (Signed)
Patient alert and oriented, pain is controlled. Patient is tolerating fluids, advanced to protein shake today, pawtient tolerated well.  Reviewed Gastric sleeve discharge instructions with patient and patient is able to articulate understanding.  Provided information on BELT program, Support Group and [facility] outpatient pharmacy. All questions answered, will continue to monitor.

## 2016-01-30 NOTE — Discharge Summary (Signed)
Patient ID: Tamara [name] [id number] 65 y.o. Oct 23, 1951  01/28/2016  Discharge date and time: 01/30/2016   Admitting Physician: Excell Seltzer T  Discharge Physician: Excell Seltzer T  Admission Diagnoses: MORBID OBESITY  Discharge Diagnoses: Morbid obesity  Operations: Procedure(s): LAPAROSCOPIC GASTRIC SLEEVE RESECTION WITH UPPER ENDO  Admission Condition: fair  Discharged Condition: fair  Indication for Admission: Patient is a 65 year old female with progressive morbid obesity unresponsive to medical management with a BMI 51 and multiple comorbidities including hypertension, obstructive sleep apnea, dyslipidemiaand DJD. Following extensive preoperative workup and discussion detailed elsewhere she is electively admitted for sleeve gastrectomy for surgical treatment of her morbid obesity.  Hospital Course: On the day of admission the patient underwent an uneventful laparoscopic sleeve gastrectomy. Her postoperative course wasunremarkable. Vital signs remained stable. She was started on ice and water the first postoperative day which she tolerated well. She had mild pain and no significant nausea. By the second postoperative day she denies any pain or nausea. Tolerating protein shakes. Ambulatory. Abdomen is soft and nontender and wounds clean. She is felt ready for discharge.   Disposition: Home  Patient Instructions:    Medication List    STOP taking these medications        fluconazole 150 MG tablet  Commonly known as:  DIFLUCAN      TAKE these medications        albuterol 108 (90 Base) MCG/ACT inhaler  Commonly known as:  PROVENTIL HFA;VENTOLIN HFA  Inhale 2 puffs into the lungs every 6 (six) hours as needed for wheezing or shortness of breath.     anastrozole 1 MG tablet  Commonly known as:  ARIMIDEX  Take 1 tablet (1 mg total) by mouth daily.     aspirin 81 MG tablet  Take 81 mg by mouth daily.  Notes to Patient:  Avoid NSAIDs for 6-8 weeks after  surgery      carvedilol 25 MG tablet  Commonly known as:  COREG  2 in the morning one at bedtime     Clindamycin-Benzoyl Per (Refr) gel  Commonly known as:  DUAC  Apply 1 application topically as needed (rash).     cloNIDine 0.1 mg/24hr patch  Commonly known as:  CATAPRES - Dosed in mg/24 hr  APPLY ONE PATCH TOPICALLY ONCE A WEEK. USE FOR HEADACHES  Notes to Patient:  Monitor Blood Pressure Daily and keep a log for primary care physician.  You may need to make changes to your medications with rapid weight loss.        diphenhydramine-acetaminophen 25-500 MG Tabs tablet  Commonly known as:  TYLENOL PM  Take 2 tablets by mouth at bedtime as needed (sleep).     divalproex 250 MG 24 hr tablet  Commonly known as:  DEPAKOTE ER  Take 1 tablet (250 mg total) by mouth daily.  Notes to Patient:  This medication cannot be crushed or cut in half     fluticasone 0.05 % cream  Commonly known as:  CUTIVATE  Apply 1 application topically as needed (rash).     furosemide 40 MG tablet  Commonly known as:  LASIX  Take one and half tabs daily     gabapentin 300 MG capsule  Commonly known as:  NEURONTIN  TAKE ONE CAPSULE BY MOUTH AT BEDTIME TAKE  FOR  HOT  FLASHES     HYDROcodone-homatropine 5-1.5 MG/5ML syrup  Commonly known as:  HYCODAN  1/2 teaspoon at bedtime when necessary for cough     ketoconazole 2 %  cream  Commonly known as:  NIZORAL  Apply 1 application topically daily as needed (rash).     LORazepam 1 MG tablet  Commonly known as:  ATIVAN  Take 1 tablet (1 mg total) by mouth 2 (two) times daily as needed.     losartan 100 MG tablet  Commonly known as:  COZAAR  TAKE ONE TABLET BY MOUTH ONCE DAILY.     losartan 100 MG tablet  Commonly known as:  COZAAR  One twice daily     Melatonin 3 MG Tabs  Take 3-5 mg by mouth at bedtime.     potassium chloride SA 20 MEQ tablet  Commonly known as:  K-DUR,KLOR-CON  Take 1 tablet (20 mEq total) by mouth daily.     simvastatin 20  MG tablet  Commonly known as:  ZOCOR  Take 1 tablet (20 mg total) by mouth every evening.     traMADol 50 MG tablet  Commonly known as:  ULTRAM  Take 1 tablet (50 mg total) by mouth every 8 (eight) hours as needed.     venlafaxine XR 150 MG 24 hr capsule  Commonly known as:  EFFEXOR-XR  Take 1 capsule (150 mg total) by mouth daily with breakfast.  Notes to Patient:  This medication cannot be crushed or cut in half        Activity: activity as tolerated Diet: bariatric protein shakes Wound Care: none needed  Follow-up:  With Dr. Excell Seltzer in 3 weeks.  Signed: Edward Jolly MD, FACS  01/30/2016, 7:43 AM

## 2016-01-30 NOTE — Progress Notes (Signed)
Patient ID: Tamara [name], female   DOB: August 21, 1951, 65 y.o.   MRN: [id number] 2 Days Post-Op  Subjective: No complaints this morning. Denies pain or nausea.Tolerated ice and water well.Ambulatory.  Objective: Vital signs in last 24 hours: Temp:  [97.5 F (36.4 C)-98.9 F (37.2 C)] 98.4 F (36.9 C) (03/01 0500) Pulse Rate:  [53-63] 56 (03/01 0500) Resp:  [15-16] 16 (03/01 0500) BP: (135-224)/(67-97) 197/71 mmHg (03/01 0500) SpO2:  [97 %-100 %] 99 % (03/01 0500)    Intake/Output from previous day: 02/28 0701 - 03/01 0700 In: 520 [P.O.:120; I.V.:400] Out: 2900 [Urine:2900] Intake/Output this shift:    General appearance: alert, cooperative and no distress GI: normal findings: soft, non-tender Incision/Wound: clean and dry  Lab Results:   Recent Labs  01/29/16 0357 01/29/16 1602 01/30/16 0402  WBC 6.4  --  10.8*  HGB 14.4 14.1 14.5  HCT 42.8 42.9 43.3  PLT 195  --  199   BMET No results for input(s): NA, K, CL, CO2, GLUCOSE, BUN, CREATININE, CALCIUM in the last 72 hours.   Studies/Results: No results found.  Anti-infectives: Anti-infectives    Start     Dose/Rate Route Frequency Ordered Stop   01/28/16 0600  ceFAZolin (ANCEF) 3 g in dextrose 5 % 50 mL IVPB     3 g 130 mL/hr over 30 Minutes Intravenous On call to O.R. 01/27/16 1307 01/28/16 1403      Assessment/Plan: s/p Procedure(s): LAPAROSCOPIC GASTRIC SLEEVE RESECTION WITH UPPER ENDO Doing well. Start protein shakes. Anticipate discharge later today.   LOS: 2 days    Lacara Dunsworth T 01/30/2016

## 2016-01-30 NOTE — Progress Notes (Signed)
Pt A&Ox4, IVF's infusing without difficulty. Pt able to ambulate in halls. Tolerates 2oz water [employer] at this time. Abdomen surgical site 6 ports, clean dry and intact. Pt with pain rating 7/10, prn pain med given (see mar). Call light and phone in reach.

## 2016-02-01 ENCOUNTER — Ambulatory Visit: Payer: Self-pay | Admitting: Dietician

## 2016-02-04 ENCOUNTER — Telehealth: Payer: Self-pay | Admitting: Family Medicine

## 2016-02-04 NOTE — Telephone Encounter (Signed)
Pt said she had sleeve surgery and is asking if Dr Sherren Mocha can change the following med to immediate release  Or liquid divalproex (DEPAKOTE ER) 250 MG 24 hr tablet , potassium chloride SA (K-DUR,KLOR-CON) 20 MEQ tablet

## 2016-02-05 MED ORDER — POTASSIUM BICARB-CITRIC ACID 20 MEQ PO TBEF: 1.0000 | EFFERVESCENT_TABLET | Freq: Every day | ORAL | Status: DC

## 2016-02-05 MED ORDER — DIVALPROEX SODIUM 250 MG PO DR TAB: 250.0000 mg | DELAYED_RELEASE_TABLET | Freq: Three times a day (TID) | ORAL | Status: DC

## 2016-02-06 ENCOUNTER — Other Ambulatory Visit: Payer: BC Managed Care – PPO

## 2016-02-08 ENCOUNTER — Telehealth (HOSPITAL_COMMUNITY): Payer: Self-pay

## 2016-02-08 NOTE — Telephone Encounter (Signed)
Attempted DC call, no answer, left message to return call   Made discharge phone call to patient per DROP protocol. Asking the following questions.    1. Do you have someone to care for you now that you are home?   2. Are you having pain now that is not relieved by your pain medication?   3. Are you able to drink the recommended daily amount of fluids (48 ounces minimum/day) and protein (60-80 grams/day) as prescribed by the dietitian or nutritional counselor?   4. Are you taking the vitamins and minerals as prescribed?   5. Do you have the "on call" number to contact your surgeon if you have a problem or question?   6. Are your incisions free of redness, swelling or drainage? (If steri strips, address that these can fall off, shower as tolerated)  7. Have your bowels moved since your surgery?  If not, are you passing gas?   8. Are you up and walking 3-4 times per day?

## 2016-02-08 NOTE — Telephone Encounter (Signed)
Made discharge phone call to patient per DROP protocol. Asking the following questions.    1. Do you have someone to care for you now that you are home?  tes 2. Are you having pain now that is not relieved by your pain medication?  no 3. Are you able to drink the recommended daily amount of fluids (48 ounces minimum/day) and protein (60-80 grams/day) as prescribed by the dietitian or nutritional counselor?  yes 4. Are you taking the vitamins and minerals as prescribed?  yes 5. Do you have the "on call" number to contact your surgeon if you have a problem or question?  yes 6. Are your incisions free of redness, swelling or drainage? (If steri strips, address that these can fall off, shower as tolerated) no 7. Have your bowels moved since your surgery?  If not, are you passing gas?  yes 8. Are you up and walking 3-4 times per day?  yes

## 2016-02-11 ENCOUNTER — Ambulatory Visit: Payer: BC Managed Care – PPO | Admitting: Oncology

## 2016-02-12 ENCOUNTER — Encounter: Payer: Medicare Other | Attending: General Surgery

## 2016-02-12 DIAGNOSIS — Z6841 Body Mass Index (BMI) 40.0 and over, adult: Secondary | ICD-10-CM | POA: Diagnosis not present

## 2016-02-12 DIAGNOSIS — Z713 Dietary counseling and surveillance: Secondary | ICD-10-CM | POA: Insufficient documentation

## 2016-02-12 NOTE — Progress Notes (Signed)
  Follow-up visit:  2 Weeks Post-Operative Sleeve gastrectomy Surgery  Medical Nutrition Therapy:  Appt start time: 320 end time:  400.  Primary concerns today: Post-operative Bariatric Surgery Nutrition Management. Jesenia returns today having lost 20 pounds since last visit. She is excited about her weight loss. Has been frustrated with the amount of supplements and medications she has to take. Has an appointment with Dr. Sherren Mocha (PCP) tomorrow. Shamiqua states she has tolerated all foods that she has tried: Forensic psychologist [place], deli cheese, scrambled egg with cheese, [place] sausage, and cottage cheese. Has not had any vomiting. Still likes taste of protein shakes and has 2 per day.   Surgery date: 01/28/2016 Surgery type: sleeve gastrectomy Start weight at [facility]: 317 lbs on 07/13/2015 Weight today: 297.2 lbs Weight change: 20.6 lbs  TANITA  BODY COMP RESULTS  01/14/16 02/12/16   BMI (kg/m^2) N/A N/A   Fat Mass (lbs)     Fat Free Mass (lbs)     Total Body Water (lbs)      Preferred Learning Style:   No preference indicated   Learning Readiness:   Ready  24-hr recall: B (9AM): Premier protein shake throughout the morning (30g) Snk (AM): water  L (PM): 1/2 Oikos Triple Zero (7-8g) Snk (PM): part of protein shake, sugar free Jello (15g)  D (PM): rest of protein shake (15g) Snk (PM):   Fluid intake: 22 oz protein shake, 24 oz water, decaf coffee, crystal light, jello (~48 oz total fluid per day) Estimated total protein intake: 60-70g/day  Medications: see list Supplementation: taking  Using straws: no Drinking while eating: no Hair loss: some shedding but "no more than normal" Carbonated beverages: none N/V/D/C: constipation, takes Miralax and patient reports this helps Dumping syndrome: no  Recent physical activity:  Walking as tolerated  Progress Towards Goal(s):  In progress.  Handouts given during visit include:  Phase 3A lean protein    Nutritional Diagnosis:  [place]-3.3  Overweight/obesity related to past poor dietary habits and physical inactivity as evidenced by patient w/ recent sleeve gastrectomy surgery following dietary guidelines for continued weight loss.     Intervention:  Nutrition counseling/diet advancement provided.  Teaching Method Utilized:  Visual Auditory Hands on  Barriers to learning/adherence to lifestyle change: none  Demonstrated degree of understanding via:  Teach Back   Monitoring/Evaluation:  Dietary intake, exercise, and body weight. Follow up in 6 weeks for 2 month post-op visit.

## 2016-02-12 NOTE — Patient Instructions (Addendum)
-  Wait 2 hours between each Calcium and your vitamin and Calcium -Keep walking as tolerated -Keep listening to your body! -Keep up the good work!   Surgery date: 01/28/2016 Surgery type: sleeve gastrectomy Start weight at [facility]: 317 lbs on 07/13/2015 Weight today: 297.2 lbs Weight change: 20.6 lbs

## 2016-02-13 ENCOUNTER — Telehealth: Payer: Self-pay | Admitting: Family Medicine

## 2016-02-13 ENCOUNTER — Ambulatory Visit (INDEPENDENT_AMBULATORY_CARE_PROVIDER_SITE_OTHER): Payer: Medicare Other | Admitting: Family Medicine

## 2016-02-13 ENCOUNTER — Encounter: Payer: Self-pay | Admitting: Family Medicine

## 2016-02-13 ENCOUNTER — Other Ambulatory Visit (HOSPITAL_BASED_OUTPATIENT_CLINIC_OR_DEPARTMENT_OTHER): Payer: Medicare Other

## 2016-02-13 DIAGNOSIS — M8580 Other specified disorders of bone density and structure, unspecified site: Secondary | ICD-10-CM

## 2016-02-13 DIAGNOSIS — D0512 Intraductal carcinoma in situ of left breast: Secondary | ICD-10-CM

## 2016-02-13 DIAGNOSIS — I10 Essential (primary) hypertension: Secondary | ICD-10-CM | POA: Diagnosis not present

## 2016-02-13 DIAGNOSIS — C50412 Malignant neoplasm of upper-outer quadrant of left female breast: Secondary | ICD-10-CM

## 2016-02-13 DIAGNOSIS — M899 Disorder of bone, unspecified: Secondary | ICD-10-CM | POA: Diagnosis not present

## 2016-02-13 LAB — BASIC METABOLIC PANEL
BUN: 30 mg/dL — AB (ref 6–23)
CALCIUM: 10 mg/dL (ref 8.4–10.5)
CO2: 32 mEq/L (ref 19–32)
CREATININE: 0.84 mg/dL (ref 0.40–1.20)
Chloride: 100 mEq/L (ref 96–112)
GFR: 72.12 mL/min (ref >60.00)
Glucose, Bld: 102 mg/dL — ABNORMAL HIGH (ref 70–99)
Potassium: 4.8 mEq/L (ref 3.5–5.1)
Sodium: 138 mEq/L (ref 135–145)

## 2016-02-13 LAB — CBC WITH DIFFERENTIAL/PLATELET
BASO%: 0.8 % (ref 0.0–2.0)
BASOS ABS: 0 10*3/uL (ref 0.0–0.1)
BASOS PCT: 0.3 % (ref 0.0–3.0)
Basophils Absolute: 0.1 10*3/uL (ref 0.0–0.1)
EOS ABS: 0.2 10*3/uL (ref 0.0–0.5)
EOS%: 2.4 % (ref 0.0–7.0)
Eosinophils Absolute: 0.2 10*3/uL (ref 0.0–0.7)
Eosinophils Relative: 2.7 % (ref 0.0–5.0)
HCT: 43.7 % (ref 34.8–46.6)
HEMATOCRIT: 41.7 % (ref 36.0–46.0)
HEMOGLOBIN: 14.2 g/dL (ref 11.6–15.9)
Hemoglobin: 13.9 g/dL (ref 12.0–15.0)
LYMPH%: 16.1 % (ref 14.0–49.7)
LYMPHS ABS: 1.3 10*3/uL (ref 0.7–4.0)
Lymphocytes Relative: 18.8 % (ref 12.0–46.0)
MCH: 28 pg (ref 25.1–34.0)
MCHC: 33.3 g/dL (ref 30.0–36.0)
MCHC: 32.5 g/dL (ref 31.5–36.0)
MCV: 85.5 fl (ref 78.0–100.0)
MCV: 86.1 fL (ref 79.5–101.0)
MONO#: 0.6 10*3/uL (ref 0.1–0.9)
MONO%: 8.6 % (ref 0.0–14.0)
MONOS PCT: 9.2 % (ref 3.0–12.0)
Monocytes Absolute: 0.6 10*3/uL (ref 0.1–1.0)
NEUT%: 72.1 % (ref 38.4–76.8)
NEUTROS ABS: 4.7 10*3/uL (ref 1.4–7.7)
NEUTROS ABS: 5.1 10*3/uL (ref 1.5–6.5)
NEUTROS PCT: 69 % (ref 43.0–77.0)
PLATELETS: 180 10*3/uL (ref 150.0–400.0)
Platelets: 176 10*3/uL (ref 145–400)
RBC: 4.88 Mil/uL (ref 3.87–5.11)
RBC: 5.07 10*6/uL (ref 3.70–5.45)
RDW: 14.4 % (ref 11.5–15.5)
RDW: 14.3 % (ref 11.2–14.5)
WBC: 6.8 10*3/uL (ref 4.0–10.5)
WBC: 7 10*3/uL (ref 3.9–10.3)
lymph#: 1.1 10*3/uL (ref 0.9–3.3)

## 2016-02-13 LAB — COMPREHENSIVE METABOLIC PANEL
ALT: 32 U/L (ref 0–55)
AST: 21 U/L (ref 5–34)
Albumin: 3.7 g/dL (ref 3.5–5.0)
Alkaline Phosphatase: 61 U/L (ref 40–150)
Anion Gap: 9 mEq/L (ref 3–11)
BILIRUBIN TOTAL: 0.44 mg/dL (ref 0.20–1.20)
BUN: 30.8 mg/dL — AB (ref 7.0–26.0)
CO2: 28 meq/L (ref 22–29)
Calcium: 9.9 mg/dL (ref 8.4–10.4)
Chloride: 103 mEq/L (ref 98–109)
Creatinine: 0.9 mg/dL (ref 0.6–1.1)
EGFR: 64 mL/min/{1.73_m2} — AB (ref >90)
GLUCOSE: 103 mg/dL (ref 70–140)
Potassium: 4.4 mEq/L (ref 3.5–5.1)
SODIUM: 140 meq/L (ref 136–145)
TOTAL PROTEIN: 7.3 g/dL (ref 6.4–8.3)

## 2016-02-13 MED ORDER — POTASSIUM BICARB-CITRIC ACID 20 MEQ PO TBEF: 1.0000 | EFFERVESCENT_TABLET | Freq: Every day | ORAL | Status: DC

## 2016-02-13 NOTE — Telephone Encounter (Signed)
Pt states her insurance company needs a prior authorization for her Potassium Bicarb-Citric Acid 20 MEQ TBEF  Pt had sleeve surgery and can not swallow her previous rx  potassium chloride SA (K-DUR,KLOR-CON) 20 MEQ tablet  The new rx was sent in by dr todd to  [facility] / [address]

## 2016-02-13 NOTE — Progress Notes (Signed)
   Subjective:    Patient ID: Tamara [name], female    DOB: 05/07/1951, 65 y.o.   MRN: [id number]  HPI Tamara [name] is a 65 year old single female nonsmoker who comes in today for follow-up of hypertension  She recently had bariatric surgery by Dr. Adonis Housekeeper on 01/28/2016. She was discharged on March 1. After discharged she was unable to swallow her potassium pill therefore she stopped taking it. She's therefore been off her 20 mEq of potassium now for 2 weeks.  She called about this problem a week ago and we called her in the effort K however she's not gotten it filled yet. Advised to get it filled now. Also will check labs today.  We discussed his time goes on and she lose more weight she'll need less blood pressure medication. We'll begin by decreasing her Lasix from 60 mg daily to 40   Review of Systems    review of systems otherwise negative Objective:   Physical Exam  Well-developed well-nourished female no acute distress vital signs stable weight down to 300 pounds she was 317 prior to surgery      Assessment & Plan:  Hypertension........... continue meds except for decrease Lasix from 60 mg a day to 40......... check potassium level today......Marland Kitchen effort K 20 mEq daily......... BP check daily...Marland KitchenMarland KitchenMarland Kitchen follow-up here in 4 weeks

## 2016-02-13 NOTE — Progress Notes (Signed)
Pre visit review using our clinic review tool, if applicable. No additional management support is needed unless otherwise documented below in the visit note. 

## 2016-02-13 NOTE — Patient Instructions (Signed)
Decrease the Lasix to 40 mg daily  Potassium supplement 20 mEq in juice or water....... one dose daily  Labs today........... ask Jacquelynn Cree to send a copy of all your lab work to Dr. Excell Seltzer  Check your blood pressure daily in the morning  Follow-up in 4 weeks........Marland Kitchen bring a record of all your blood pressure readings and the device  .Marland KitchenMarland KitchenMarland Kitchen

## 2016-02-14 LAB — VITAMIN D 25 HYDROXY (VIT D DEFICIENCY, FRACTURES): VIT D 25 HYDROXY: 43.2 ng/mL (ref 30.0–100.0)

## 2016-02-15 NOTE — Telephone Encounter (Signed)
Estill Bamberg or [place], would either of you mind helping me with this PA please? Thank you!

## 2016-02-18 ENCOUNTER — Telehealth: Payer: Self-pay | Admitting: Oncology

## 2016-02-18 ENCOUNTER — Other Ambulatory Visit: Payer: Self-pay | Admitting: Nurse Practitioner

## 2016-02-18 NOTE — Telephone Encounter (Signed)
Per 3/20 pof due to HB urgent PAL rescheduled 3/22 f/u to next available appointment that is convenient for patient. Spoke with patient re change and new f/u for 3/31 @ 8:45 am - date/time per patient.

## 2016-02-20 ENCOUNTER — Ambulatory Visit: Payer: BC Managed Care – PPO | Admitting: Nurse Practitioner

## 2016-02-25 ENCOUNTER — Other Ambulatory Visit: Payer: Self-pay | Admitting: Family Medicine

## 2016-02-29 ENCOUNTER — Encounter: Payer: Self-pay | Admitting: Nurse Practitioner

## 2016-02-29 ENCOUNTER — Telehealth: Payer: Self-pay | Admitting: Nurse Practitioner

## 2016-02-29 ENCOUNTER — Ambulatory Visit (HOSPITAL_BASED_OUTPATIENT_CLINIC_OR_DEPARTMENT_OTHER): Payer: Medicare Other | Admitting: Nurse Practitioner

## 2016-02-29 VITALS — BP 166/49 | HR 58 | Temp 97.5°F | Resp 18 | Ht 66.0 in | Wt 288.0 lb

## 2016-02-29 DIAGNOSIS — C50412 Malignant neoplasm of upper-outer quadrant of left female breast: Secondary | ICD-10-CM

## 2016-02-29 DIAGNOSIS — Z79811 Long term (current) use of aromatase inhibitors: Secondary | ICD-10-CM

## 2016-02-29 DIAGNOSIS — K5909 Other constipation: Secondary | ICD-10-CM | POA: Diagnosis not present

## 2016-02-29 DIAGNOSIS — Z86000 Personal history of in-situ neoplasm of breast: Secondary | ICD-10-CM

## 2016-02-29 DIAGNOSIS — D0512 Intraductal carcinoma in situ of left breast: Secondary | ICD-10-CM

## 2016-02-29 MED ORDER — FLUCONAZOLE 150 MG PO TABS: 150.0000 mg | ORAL_TABLET | Freq: Every day | ORAL | Status: DC

## 2016-02-29 MED ORDER — GABAPENTIN 300 MG PO CAPS: 300.0000 mg | ORAL_CAPSULE | Freq: Every day | ORAL | Status: DC

## 2016-02-29 MED ORDER — ANASTROZOLE 1 MG PO TABS: 1.0000 mg | ORAL_TABLET | Freq: Every day | ORAL | Status: DC

## 2016-02-29 MED ORDER — CLINDAMYCIN PHOS-BENZOYL PEROX 1.2-5 % EX GEL: 1.0000 "application " | CUTANEOUS | Status: DC | PRN

## 2016-02-29 NOTE — Telephone Encounter (Signed)
appt made and avs printed °

## 2016-02-29 NOTE — Progress Notes (Signed)
Tamara [name]  [phone] [phone]   ID: Tamara [name] OB: 02/11/1951  MR#: [id number]  [id number]  PCP: Joycelyn Man, MD GYN:   SU: Excell Seltzer OTHER MD: Gery Pray  CHIEF COMPLAINT: left breast ductal carcinoma in situ.  CURRENT TREATMENT: anastrozole   BREAST CANCER HISTORY: Tamara [name] had routine screening mammography at the [facility] Center 10/03/2013 showing suspicious calcifications in the left breast. Additional views 10/25/2013 confirmed a 2.6 area of pleomorphic calcification in the subareolar left breast. There was no associated mass. Needle core biopsy could not be performed for body habitus reasons. Excisional biopsy of this area 01/24/2014 showed ([id number]) ductal carcinoma in situ, high-grade, estrogen receptor 91% positive with strong staining intensity, progesterone receptor negative. There were multiple margins that were "very close" (less than half a millimeter).  Accordingly on 02/02/2014 the patient underwent further left breast excision. Margins remained close, but now are clear. The patient had an uneventful postoperative course and t proceeded to adjuvant radiation, which will be completed late May 2015.  Her subsequent history is as detailed below.  INTERVAL HISTORY: Tamara [name] returns today for follow-up of her noninvasive breast cancer. She has been on anastrozole since May 2015 and tolerates this drug well. She has hot flashes, generally at night, and these are well controlled with gabapentin. She has frequent yeast infections, but otherwise denies vaginal changes. She has several sites of joint pain, but this is no worse than the presentation at baseline. The interval history is remarkable for a laparoscopic gastric sleeve surgery for weight loss earlier this month. She is already down 12lbs. She tolerated the procedure well and is in little pain. She complains of constipation despite daily miralax and stool softener use.  She is still on a fairly liquid diet, and plans to graduate to soft foods next month.   REVIEW OF SYSTEMS: A detailed review of systems is otherwise stable, except where noted above.  PAST MEDICAL HISTORY: Past Medical History  Diagnosis Date  . Obese   . Cellulitis and abscess of leg   . Wears glasses   . Hearing loss   . Hyperlipidemia     takes Zocor nightly  . Hypertension     takes Coreg and Losartan daily  . Asthma     rare;only when around alot of dust-Ventolin inhaler as needed  . History of bronchitis 1966  . Sleep apnea, obstructive     uses CPAP  . Arthritis     knees   . Joint pain   . Joint swelling   . Peripheral edema     takes Furosemide daily as needed and Potassium daily  . Depression     takes Paxil daily  . Restless legs syndrome     takes depakote  . Radiation 03/08/14-04/26/14    50.4 gray to left breast. Lumpectomy cavity boosted to 64.4 gray  . Complication of anesthesia     was told 01/06/14 that airway was small  . Bursitis of right shoulder   . Breast cancer ([place])      left. States she did not have lymph nodes removed    PAST SURGICAL HISTORY: Past Surgical History  Procedure Laterality Date  . Tubal ligation    . Inner ear surgery Bilateral     for hearing loss  . Dilatation & curettage/hysteroscopy with trueclear N/A 01/06/2014    Procedure: DILATATION & CURETTAGE/HYSTEROSCOPY WITH TRUCLEAR;  Surgeon: Shon Millet II, MD;  Location: [facility];  Service: Gynecology;  Laterality: N/A;  . Joint replacement Right     Knee  . Colonoscopy    . Breast lumpectomy with needle localization Left 01/24/2014    Procedure: BREAST LUMPECTOMY WITH NEEDLE LOCALIZATION;  Surgeon: Edward Jolly, MD;  Location: [place];  Service: General;  Laterality: Left;  . Re-excision of breast lumpectomy Left 02/02/2014    Procedure: RE-EXCISION OF LEFT BREAST LUMPECTOMY;  Surgeon: Edward Jolly, MD;  Location: [place];  Service: General;  Laterality: Left;  .  Hysteroplasty  01/2014  . Laparoscopic gastric sleeve resection N/A 01/28/2016    Procedure: LAPAROSCOPIC GASTRIC SLEEVE RESECTION WITH UPPER ENDO;  Surgeon: Excell Seltzer, MD;  Location: [facility];  Service: General;  Laterality: N/A;    FAMILY HISTORY Family History  Problem Relation Age of Onset  . Dementia Mother   . Heart attack Mother   . Asthma Other   . Hypertension Other   . Thyroid disease Other   . Heart attack Other   . Esophageal cancer Father     also had stomach cancer  . Heart attack Father   . Throat cancer Paternal Grandfather    the patient's father died in his late 52s from throat and stomach cancer. The patient's mother died in her early 28s with congestive heart failure and dementia. The patient had 2 brothers, no sisters. There is no history of breast or ovarian cancer in the family  GYNECOLOGIC HISTORY:   Menarche age 1, first live birth age 58, she is Tamara [name] P3. She went through menopause more than 15 years ago. She did not take hormone replacement. 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 She is divorced and lives by herself, with no pets. Son Tamara [name] works as an Clinical biochemist for [employer]. Son Tamara [name] works as a Printmaker for the same company. Daughter Tamara [name] is an Scientist, physiological for this could feel. Care in his 4 grandchildren "bilobed", to "by blood", and one on the way.   ADVANCED DIRECTIVES:  in place. The patient has named her son Tamara [name] as her healthcare power of attorney. He can be reached at [phone]   HEALTH MAINTENANCE: Social History  Substance Use Topics  . Smoking status: Never Smoker   . Smokeless tobacco: Never Used  . Alcohol Use: No     Colonoscopy: 2008/[place]   PAP: 2014   [facility]   Lipid panel:  No Known Allergies  Current Outpatient Prescriptions    Medication Sig Dispense Refill  . albuterol (PROVENTIL HFA;VENTOLIN HFA) 108 (90 BASE) MCG/ACT inhaler Inhale 2 puffs into the lungs every 6 (six) hours as needed for wheezing or shortness of breath. 2 Inhaler 3  . anastrozole (ARIMIDEX) 1 MG tablet Take 1 tablet (1 mg total) by mouth daily. 90 tablet 3  . aspirin 81 MG tablet Take 81 mg by mouth daily.      . Calcium Carbonate-Vitamin D (CALCIUM-VITAMIN D) 500-200 MG-UNIT tablet Take 1 tablet by mouth daily.    . carvedilol (COREG) 25 MG tablet 2 in the morning one at bedtime (Patient taking differently: Take 25-50 mg by mouth 2 (two) times daily with a meal. 2 in the morning one at bedtime) 300 tablet 3  . Clindamycin-Benzoyl Per, Refr, (DUAC) gel Apply 1 application topically as needed (rash). 45 g 4  . cloNIDine (CATAPRES - DOSED IN MG/24  HR) 0.1 mg/24hr patch APPLY ONE PATCH TOPICALLY ONCE A WEEK. USE FOR HEADACHES 4 patch 3  . Cyanocobalamin (B-12 PO) Take by mouth. 1/2 teaspoon    . diphenhydramine-acetaminophen (TYLENOL PM) 25-500 MG TABS Take 2 tablets by mouth at bedtime as needed (sleep).    . divalproex (DEPAKOTE ER) 250 MG 24 hr tablet TAKE ONE TABLET BY MOUTH ONCE DAILY 100 tablet 3  . divalproex (DEPAKOTE) 250 MG DR tablet Take 1 tablet (250 mg total) by mouth 3 (three) times daily. 90 tablet 3  . fluconazole (DIFLUCAN) 150 MG tablet Take 1 tablet (150 mg total) by mouth daily. Take 1 tablet for 3 days then stop. If symptoms recur, repeat 3 days. 30 tablet 0  . fluticasone (CUTIVATE) 0.05 % cream Apply 1 application topically as needed (rash). 30 g 1  . furosemide (LASIX) 40 MG tablet Take one and half tabs daily (Patient taking differently: Take 60 mg by mouth daily. Take one and half tabs daily) 150 tablet 3  . gabapentin (NEURONTIN) 300 MG capsule Take 1 capsule (300 mg total) by mouth at bedtime. 90 capsule 3  . ketoconazole (NIZORAL) 2 % cream Apply 1 application topically daily as needed (rash). 15 g 4  . LORazepam (ATIVAN) 1  MG tablet Take 1 tablet (1 mg total) by mouth 2 (two) times daily as needed. (Patient taking differently: Take 1 mg by mouth 2 (two) times daily as needed for anxiety. ) 60 tablet 5  . losartan (COZAAR) 100 MG tablet TAKE ONE TABLET BY MOUTH ONCE DAILY. 100 tablet 3  . losartan (COZAAR) 100 MG tablet One twice daily (Patient taking differently: Take 100 mg by mouth 2 (two) times daily. ) 200 tablet 3  . Melatonin 3 MG TABS Take 3-5 mg by mouth at bedtime.    . ondansetron (ZOFRAN-ODT) 4 MG disintegrating tablet Take 1 tablet by mouth every 6 (six) hours as needed.    Marland Kitchen OVER THE COUNTER MEDICATION flintstone with iron    . oxyCODONE (ROXICODONE) 5 MG/5ML solution Take 5 mLs by mouth.    . pantoprazole (PROTONIX) 40 MG tablet Take 40 mg by mouth daily.    . Pediatric Multiple Vitamins (MULTIVITAMIN) LIQD Take 5 mLs by mouth daily.    . Potassium Bicarb-Citric Acid 20 MEQ TBEF Take 1 tablet (20 mEq total) by mouth daily. 90 each 0  . potassium chloride SA (K-DUR,KLOR-CON) 20 MEQ tablet Take 1 tablet (20 mEq total) by mouth daily. 100 tablet 3  . simvastatin (ZOCOR) 20 MG tablet Take 1 tablet (20 mg total) by mouth every evening. 100 tablet 3  . traMADol (ULTRAM) 50 MG tablet Take 1 tablet (50 mg total) by mouth every 8 (eight) hours as needed. (Patient taking differently: Take 50 mg by mouth 2 (two) times daily. ) 90 tablet 3  . venlafaxine XR (EFFEXOR-XR) 150 MG 24 hr capsule Take 1 capsule (150 mg total) by mouth daily with breakfast. 30 capsule 3   No current facility-administered medications for this visit.    OBJECTIVE: Middle-aged white woman who appears stated age 65 Vitals:   02/29/16 0853  BP: 166/49  Pulse: 58  Temp: 97.5 F (36.4 C)  Resp: 18     Body mass index is 46.51 kg/(m^2).    ECOG FS:1 - Symptomatic but completely ambulatory  Skin: warm, dry  HEENT: sclerae anicteric, conjunctivae pink, oropharynx clear. No thrush or mucositis.  Lymph Nodes: No cervical or  supraclavicular lymphadenopathy  Lungs: clear to auscultation  bilaterally, no rales, wheezes, or rhonci  Heart: regular rate and rhythm  Abdomen: obese, soft, non tender, positive bowel sounds, a few laparoscopic sites from recent surgery clean, dry, and intact  Musculoskeletal: No focal spinal tenderness, no peripheral edema  Neuro: non focal, well oriented, positive affect  Breasts: left breast status post lumpectomy and radiation. No evidence of recurrent disease. Left axilla benign. Right breast unremarkable.  LAB RESULTS: I No results found for: SPEP  Lab Results  Component Value Date   WBC 7.0 02/13/2016   NEUTROABS 5.1 02/13/2016   HGB 14.2 02/13/2016   HCT 43.7 02/13/2016   MCV 86.1 02/13/2016   PLT 176 02/13/2016      Chemistry      Component Value Date/Time   NA 140 02/13/2016 1411   NA 138 02/13/2016 1136   K 4.4 02/13/2016 1411   K 4.8 02/13/2016 1136   CL 100 02/13/2016 1136   CO2 28 02/13/2016 1411   CO2 32 02/13/2016 1136   BUN 30.8* 02/13/2016 1411   BUN 30* 02/13/2016 1136   CREATININE 0.9 02/13/2016 1411   CREATININE 0.84 02/13/2016 1136   CREATININE 0.78 08/02/2011 0834      Component Value Date/Time   CALCIUM 9.9 02/13/2016 1411   CALCIUM 10.0 02/13/2016 1136   ALKPHOS 61 02/13/2016 1411   ALKPHOS 53 01/18/2016 1205   AST 21 02/13/2016 1411   AST 37 01/18/2016 1205   ALT 32 02/13/2016 1411   ALT 32 01/18/2016 1205   BILITOT 0.44 02/13/2016 1411   BILITOT 0.6 01/18/2016 1205       No results found for: LABCA2  No components found for: LABCA125  No results for input(s): INR in the last 168 hours.  Urinalysis    Component Value Date/Time   COLORURINE yellow 03/19/2010 0815   APPEARANCEUR Clear 03/19/2010 0815   LABSPEC 1.015 03/19/2010 0815   PHURINE 7.0 03/19/2010 0815   GLUCOSEU NEGATIVE 11/14/2009 1155   HGBUR negative 03/19/2010 0815   HGBUR NEGATIVE 11/14/2009 1155   BILIRUBINUR n 09/21/2014 1024   BILIRUBINUR negative  03/19/2010 0815   KETONESUR NEGATIVE 11/14/2009 1155   PROTEINUR n 09/21/2014 1024   PROTEINUR NEGATIVE 11/14/2009 1155   UROBILINOGEN 0.2 09/21/2014 1024   UROBILINOGEN 0.2 03/19/2010 0815   NITRITE n 09/21/2014 1024   NITRITE negative 03/19/2010 0815   LEUKOCYTESUR Negative 09/21/2014 1024    STUDIES: No results found.   EXAM: DIGITAL DIAGNOSTIC LEFT MAMMOGRAM WITH 3D TOMOSYNTHESIS AND CAD  COMPARISON: Previous exam(s).  ACR Breast Density Category b: There are scattered areas of fibroglandular density.  FINDINGS: No suspicious masses or calcifications are seen in the left breast. Postsurgical changes are present in the retroareolar left breast related to prior lumpectomy. Spot compression magnification views of the lumpectomy site in the left breast was performed demonstrating few round punctate calcifications, some of which have lucent centers. There are no mammographic findings of malignancy in the left breast.  Mammographic images were processed with CAD.  IMPRESSION: Benign-appearing left breast calcifications. No mammographic evidence of malignancy in the left breast.  RECOMMENDATION: Bilateral diagnostic mammography August 2017.  I have discussed the findings and recommendations with the patient. Results were also provided in writing at the conclusion of the visit. If applicable, a reminder letter will be sent to the patient regarding the next appointment.  BI-RADS CATEGORY 2: Benign.   Electronically Signed  By: Everlean Alstrom M.D.  On: 01/18/2016 09:18   ASSESSMENT: 65 y.o. status post left lumpectomy  01/24/2014 for ductal carcinoma in situ, high-grade, estrogen receptor 91% positive, progesterone receptor negative, with positive margins  (1) status post further left breast excision for margin clearance 02/02/2014, achieving close but negative margins.  (2) undergoing radiation therapy to the left breast completed 04/26/2014  (3)  anastrozole started May 2015  (a) bone density scan November 2015 showed a T score of -2.0  PLAN: Tamara [name] is doing well as far as her breast cancer is concerned. She is now 2 years out from her definitive surgery with no evidence of recurrent disease. The labs were reviewed in detail and were stable. She it tolerating the anastrozole well and will continue this drug for 5 years of antiestrogen therapy.   I have advised her to increase her stool softener to 2 tablets BID and her miralax to BID as well to hopefully relieve her constipation.   I refilled a few of her supportive medicines, as we have been doing annually.   Tamara [name] will return in 1 year for labs and a follow up visit. She understands and agrees with this plan. She knows the goal of treatment in her case is cure. She has been encouraged to call with any issues that might arise before her next visit here.    Tamara Panda, NP   02/29/2016 9:12 AM

## 2016-03-13 NOTE — Telephone Encounter (Signed)
Per [place] no PA needed for this medication.

## 2016-03-14 ENCOUNTER — Other Ambulatory Visit: Payer: Self-pay | Admitting: *Deleted

## 2016-03-14 MED ORDER — CLINDAMYCIN PHOS-BENZOYL PEROX 1.2-5 % EX GEL: 1.0000 "application " | CUTANEOUS | Status: DC | PRN

## 2016-03-18 ENCOUNTER — Ambulatory Visit (INDEPENDENT_AMBULATORY_CARE_PROVIDER_SITE_OTHER): Payer: Medicare Other | Admitting: Family Medicine

## 2016-03-18 ENCOUNTER — Encounter: Payer: Self-pay | Admitting: Family Medicine

## 2016-03-18 VITALS — BP 140/94 | Temp 98.0°F | Wt 285.0 lb

## 2016-03-18 DIAGNOSIS — I10 Essential (primary) hypertension: Secondary | ICD-10-CM | POA: Diagnosis not present

## 2016-03-18 MED ORDER — AMLODIPINE BESYLATE 2.5 MG PO TABS: 2.5000 mg | ORAL_TABLET | Freq: Every day | ORAL | Status: DC

## 2016-03-18 NOTE — Progress Notes (Signed)
   Subjective:    Patient ID: Tamara [name], female    DOB: 09/23/1951, 65 y.o.   MRN: [id number]  HPI Tamara [name] is a 65 year old single female nonsmoker who comes in today for follow-up of hypertension  Her current medicines are Coreg 50 mg in the morning and 25 mg at bedtime, Lasix 60 mg daily, Cozaar 100 mg twice a day, potassium 20 mEq supplement 1 daily  BP has dropped from the 123XX123 systolic down to XX123456. Diastolic is dropped from 123XX123 down to mid 90s.  Her weight is steady at 285. She's had gastric bypass surgery.   Review of Systems review of systems negative    Objective:   Physical Exam   well-developed well-nourished female no acute distress vital signs stable she's afebrile BP right arm sitting position 140/90       Assessment & Plan:  Hypertension approaching goal almost......... add Norvasc 2.5 mg daily. Follow-up in 4-6 weeks with one of our new folks to get established for long-term care and to follow-up on her blood pressure

## 2016-03-18 NOTE — Patient Instructions (Signed)
Norvasc 2.5 mg........ one tablet daily in the morning  Continue your other blood pressure medications  Check your blood pressure daily in the morning as your currently doing  Follow-up in 6-8 weeks with one of our new folks and to get established for long-term care

## 2016-03-18 NOTE — Progress Notes (Signed)
Pre visit review using our clinic review tool, if applicable. No additional management support is needed unless otherwise documented below in the visit note. 

## 2016-03-20 ENCOUNTER — Telehealth: Payer: Self-pay

## 2016-03-20 NOTE — Telephone Encounter (Signed)
Prior auth form rcvd from [employer] for Duac gel.  Placed in managed care box.

## 2016-03-24 ENCOUNTER — Encounter: Payer: Self-pay | Admitting: Oncology

## 2016-03-24 NOTE — Progress Notes (Signed)
Sent to [facility] prior auth for duac gel

## 2016-03-26 ENCOUNTER — Encounter: Payer: Self-pay | Admitting: Oncology

## 2016-03-26 NOTE — Progress Notes (Signed)
I let Val know I called [facility] and they need to know what the gel is going to be used for. Matthew Saras gave case# [id number] to call back or fill out fax and get back to them for prior auth.

## 2016-03-26 NOTE — Progress Notes (Signed)
Per Jan [facility] ref#[id number]-patient doesn't have prescp drug coverage. I let Val know. Tried to get duac gel approved.

## 2016-03-27 ENCOUNTER — Encounter: Payer: Self-pay | Admitting: Oncology

## 2016-03-27 NOTE — Progress Notes (Signed)
I left form from [facility] for val-- duac gel was denied. Need others tried and failed.

## 2016-03-28 ENCOUNTER — Other Ambulatory Visit: Payer: Self-pay | Admitting: *Deleted

## 2016-03-28 MED ORDER — CLINDAMYCIN PHOSPHATE 1 % EX GEL: Freq: Two times a day (BID) | CUTANEOUS | Status: DC

## 2016-04-01 ENCOUNTER — Other Ambulatory Visit: Payer: Self-pay | Admitting: *Deleted

## 2016-04-01 ENCOUNTER — Other Ambulatory Visit: Payer: Self-pay | Admitting: Oncology

## 2016-04-04 ENCOUNTER — Encounter: Payer: Self-pay | Admitting: Dietician

## 2016-04-04 ENCOUNTER — Encounter: Payer: Medicare Other | Attending: General Surgery | Admitting: Dietician

## 2016-04-04 DIAGNOSIS — Z713 Dietary counseling and surveillance: Secondary | ICD-10-CM | POA: Insufficient documentation

## 2016-04-04 DIAGNOSIS — Z6841 Body Mass Index (BMI) 40.0 and over, adult: Secondary | ICD-10-CM | POA: Diagnosis not present

## 2016-04-04 NOTE — Patient Instructions (Addendum)
-  Work on Freight forwarder out protein shakes as you are able to eat more food -Keep walking as tolerated -Keep listening to your body and keep up the good work! -Try a cup of coffee in the morning to stimulate a bowel movement   Surgery date: 01/28/2016 Surgery type: sleeve gastrectomy Start weight at [facility]: 317 lbs on 07/13/2015 Weight today: 278 lbs Weight change: 19 lbs Total weight lost: 39 lbs

## 2016-04-04 NOTE — Progress Notes (Signed)
  Follow-up visit:  2.5 months Post-Operative Sleeve gastrectomy Surgery  Medical Nutrition Therapy:  Appt start time: [id number] end time:  900  Primary concerns today: Post-operative Bariatric Surgery Nutrition Management. Tamara [name] returns today having lost another 19 pounds since last visit. She reports still using protein shakes to supplement protein needs (3-4 per day). We discussed phasing out protein shakes as she is able to tolerate more food. Sensitive to spices on meats. Tolerating broccoli and other vegetables. "If I don't feel like eating I'll drink a protein shake." Able to tolerate about 1.5 oz chicken at a time. Also eating eggs, cottage cheese, and tuna.    Non scale victories: getting off the couch easily, putting socks on, trimming toenails  Surgery date: 01/28/2016 Surgery type: sleeve gastrectomy Start weight at [facility]: 317 lbs on 07/13/2015 Weight today: 278 lbs Weight change: 19 lbs Total weight lost: 39 lbs  Goal: below 200 lbs   Preferred Learning Style:   No preference indicated   Learning Readiness:   Ready  24-hr recall: B (9AM): Premier protein shake throughout the morning (30g) Snk (AM): yogurt   L (PM): 1/2 Oikos Triple Zero (15g) Snk (PM):  D (PM): 1.5 oz chicken and broccoli (10g) Snk (PM):   Fluid intake: 33-44 oz protein shake, 24 oz water, decaf coffee, crystal light, sugar free jello (~48 oz total fluid per day) Estimated total protein intake: 100+ grams per day  Medications: see list, reduced Lasix and has stopped Potassium Supplementation: taking in liquid form   Using straws: no Drinking while eating: no Hair loss: yes Carbonated beverages: none N/V/D/C: constipation, takes Miralax and Benefiber Dumping syndrome: no  Recent physical activity:  Walking as tolerated  Progress Towards Goal(s):  In progress.  Handouts given during visit include:  Phase 3A lean protein    Nutritional Diagnosis:  [place]-3.3 Overweight/obesity related to past  poor dietary habits and physical inactivity as evidenced by patient w/ recent sleeve gastrectomy surgery following dietary guidelines for continued weight loss.     Intervention:  Nutrition counseling/diet advancement provided.  Teaching Method Utilized:  Visual Auditory Hands on  Barriers to learning/adherence to lifestyle change: none  Demonstrated degree of understanding via:  Teach Back   Monitoring/Evaluation:  Dietary intake, exercise, and body weight. Follow up in 6 weeks for 2 month post-op visit.

## 2016-04-23 ENCOUNTER — Other Ambulatory Visit: Payer: Self-pay | Admitting: Family Medicine

## 2016-04-29 ENCOUNTER — Other Ambulatory Visit: Payer: Self-pay | Admitting: Family Medicine

## 2016-05-02 DIAGNOSIS — Z9884 Bariatric surgery status: Secondary | ICD-10-CM | POA: Diagnosis not present

## 2016-05-06 DIAGNOSIS — E7800 Pure hypercholesterolemia, unspecified: Secondary | ICD-10-CM | POA: Diagnosis not present

## 2016-05-06 DIAGNOSIS — I10 Essential (primary) hypertension: Secondary | ICD-10-CM | POA: Diagnosis not present

## 2016-05-06 DIAGNOSIS — Z9884 Bariatric surgery status: Secondary | ICD-10-CM | POA: Diagnosis not present

## 2016-05-08 ENCOUNTER — Encounter: Payer: Self-pay | Admitting: Family Medicine

## 2016-05-16 ENCOUNTER — Encounter: Payer: Self-pay | Admitting: Family Medicine

## 2016-05-16 ENCOUNTER — Ambulatory Visit (INDEPENDENT_AMBULATORY_CARE_PROVIDER_SITE_OTHER): Payer: Medicare Other | Admitting: Family Medicine

## 2016-05-16 VITALS — BP 128/78 | HR 56 | Temp 98.5°F | Resp 12 | Ht 66.0 in | Wt 269.4 lb

## 2016-05-16 DIAGNOSIS — R600 Localized edema: Secondary | ICD-10-CM

## 2016-05-16 DIAGNOSIS — R001 Bradycardia, unspecified: Secondary | ICD-10-CM

## 2016-05-16 DIAGNOSIS — K5900 Constipation, unspecified: Secondary | ICD-10-CM

## 2016-05-16 DIAGNOSIS — I10 Essential (primary) hypertension: Secondary | ICD-10-CM | POA: Diagnosis not present

## 2016-05-16 MED ORDER — LINACLOTIDE 145 MCG PO CAPS: 145.0000 ug | ORAL_CAPSULE | Freq: Every day | ORAL | Status: DC

## 2016-05-16 NOTE — Progress Notes (Signed)
HPI:   [name] is a 65 y.o. female, who is here today to establish care with me.  Former PCP: Dr Sherren Mocha.  Last preventive routine visit: Not sure. She followed with Dr Sherren Mocha in 03/2016.  She has Hx of breast cancer, follows with oncologists q 6 months. S/P bariatric procedure, gastric sleeve. She is following with surgeon q 3 months, next appt in 08/2016.  Reviewing medications, some she is not sure why she is taking.  Hx of HLD,HTN, depression,OSA on CPaP, menopausal symptoms (hot flashes), and osteopenia among some.    Concerns today: Constipation.  Constipation: She has had it for "a while", worse after bariatric procedure. She has tried  OTC but helps little, having bowel movement about  once weekly. She has occasional rectal bleeding associated with straining. + Nausea and cramp abdominal/back pain sometimes, all resolved if a good bowel movement. Colonoscopy in 2008.  She denies numbness, tingling, or burning sensation. She takes Tramadol 50 mg bid for arthralgias, knee pain.   Depression/anxiety: Currently she is on Effexor XR 150 mg daily and Lorazepam. The latter one she is supposed to take as needed but instead she takes it around the clock, states that it does not "do anything." Her oncologist started Effexor, denies depression symptoms. Prior medications include: Not sure. Hospitalizations due to psychiatric disorders: Denies. Hx of bipolar disorder: Denies. She takes Depakote but not sure about indication. Insomnia/sleep disorder Denies. Suicidal thoughts/ideation Denies.  Mother and maternal aunt Hx of depression. Lives alone.  Hypertension: Currently she is on Carvedilol 25 mg 3 tabs daily, Cozaar 100 mg taking it once daily, and Amlodipine 2.5 mg daily. She is taking medications daily and no side effects reported.  She has not noted unusual headache, visual changes, exertional chest pain, dyspnea,  focal weakness, or edema. She takes  Furosemide 20 mg daily for LE edema, which has improved greatly after bariatric procedure and wt loss.   Lab Results  Component Value Date   CREATININE 0.9 02/13/2016   BUN 30.8* 02/13/2016   NA 140 02/13/2016   K 4.4 02/13/2016   CL 100 02/13/2016   CO2 28 02/13/2016    BP reading at home: 120-140's/? Noted mild bradycardia and occasional extra beats on auscultation, denies palpitations or dizziness.  HLD:  Lab Results  Component Value Date   CHOL 176 09/21/2014   HDL 43.50 09/21/2014   LDLCALC 153* 08/02/2011   LDLDIRECT 100.3 09/21/2014   TRIG 207.0* 09/21/2014   CHOLHDL 4 09/21/2014   She is on Zocor 20 mg daily. No side effects reported. Following a low fat diet. She denies any Hx of CVD.    Review of Systems  Constitutional: Positive for fatigue. Negative for fever, activity change, appetite change and unexpected weight change.  HENT: Negative for mouth sores, nosebleeds, postnasal drip and trouble swallowing.   Eyes: Negative for redness and visual disturbance.  Respiratory: Negative for cough, shortness of breath and wheezing.   Cardiovascular: Negative for chest pain, palpitations and leg swelling.  Gastrointestinal: Positive for constipation and blood in stool. Negative for nausea, vomiting and abdominal pain.  Endocrine: Negative for cold intolerance and heat intolerance.  Genitourinary: Negative for dysuria, hematuria, decreased urine volume and difficulty urinating.  Musculoskeletal: Positive for back pain and arthralgias.       Chronic althralgias: knee mainly) and back pain.  Skin: Negative for color change, rash and wound.  Neurological: Negative for dizziness, seizures, syncope, weakness, numbness and headaches.  Psychiatric/Behavioral: Negative for suicidal ideas, hallucinations and sleep disturbance. The patient is nervous/anxious (controlled.).       Current Outpatient Prescriptions on File Prior to Visit  Medication Sig Dispense Refill  .  albuterol (PROVENTIL HFA;VENTOLIN HFA) 108 (90 BASE) MCG/ACT inhaler Inhale 2 puffs into the lungs every 6 (six) hours as needed for wheezing or shortness of breath. 2 Inhaler 3  . amLODipine (NORVASC) 2.5 MG tablet Take 1 tablet (2.5 mg total) by mouth daily. 90 tablet 3  . anastrozole (ARIMIDEX) 1 MG tablet Take 1 tablet (1 mg total) by mouth daily. 90 tablet 3  . aspirin 81 MG tablet Take 81 mg by mouth daily.      . Calcium Carbonate-Vitamin D (CALCIUM-VITAMIN D) 500-200 MG-UNIT tablet Take 1 tablet by mouth daily.    . clindamycin (CLINDAGEL) 1 % gel Apply topically 2 (two) times daily. 30 g 0  . cloNIDine (CATAPRES - DOSED IN MG/24 HR) 0.1 mg/24hr patch APPLY ONE PATCH TOPICALLY ONCE A WEEK USE FOR HEADACHES 4 patch 0  . Cyanocobalamin (B-12 PO) Take by mouth. 1/2 teaspoon    . diphenhydramine-acetaminophen (TYLENOL PM) 25-500 MG TABS Take 2 tablets by mouth at bedtime as needed (sleep).    . divalproex (DEPAKOTE ER) 250 MG 24 hr tablet TAKE ONE TABLET BY MOUTH ONCE DAILY 100 tablet 3  . fluticasone (CUTIVATE) 0.05 % cream Apply 1 application topically as needed (rash). 30 g 1  . gabapentin (NEURONTIN) 300 MG capsule Take 1 capsule (300 mg total) by mouth at bedtime. 90 capsule 3  . ketoconazole (NIZORAL) 2 % cream Apply 1 application topically daily as needed (rash). 15 g 4  . LORazepam (ATIVAN) 1 MG tablet TAKE ONE TABLET BY MOUTH TWICE DAILY AS NEEDED 60 tablet 0  . Melatonin 3 MG TABS Take 3-5 mg by mouth at bedtime.    Marland Kitchen OVER THE COUNTER MEDICATION flintstone with iron    . pantoprazole (PROTONIX) 40 MG tablet Take 40 mg by mouth daily.    . Pediatric Multiple Vitamins (MULTIVITAMIN) LIQD Take 5 mLs by mouth daily.    . traMADol (ULTRAM) 50 MG tablet Take 1 tablet (50 mg total) by mouth every 8 (eight) hours as needed. (Patient taking differently: Take 50 mg by mouth 2 (two) times daily. ) 90 tablet 3  . venlafaxine XR (EFFEXOR-XR) 150 MG 24 hr capsule Take 1 capsule (150 mg total) by  mouth daily with breakfast. 30 capsule 3   No current facility-administered medications on file prior to visit.     Past Medical History  Diagnosis Date  . Obese   . Cellulitis and abscess of leg   . Wears glasses   . Hearing loss   . Hyperlipidemia     takes Zocor nightly  . Hypertension     takes Coreg and Losartan daily  . Asthma     rare;only when around alot of dust-Ventolin inhaler as needed  . History of bronchitis 1966  . Sleep apnea, obstructive     uses CPAP  . Arthritis     knees   . Joint pain   . Joint swelling   . Peripheral edema     takes Furosemide daily as needed and Potassium daily  . Depression     takes Paxil daily  . Restless legs syndrome     takes depakote  . Radiation 03/08/14-04/26/14    50.4 gray to left breast. Lumpectomy cavity boosted to 64.4 gray  . Complication of anesthesia  was told 01/06/14 that airway was small  . Bursitis of right shoulder   . Breast cancer ([place])      left. States she did not have lymph nodes removed   No Known Allergies  Family History  Problem Relation Age of Onset  . Dementia Mother   . Heart attack Mother   . Asthma Other   . Hypertension Other   . Thyroid disease Other   . Heart attack Other   . Esophageal cancer Father     also had stomach cancer  . Heart attack Father   . Throat cancer Paternal Grandfather     Social History   Social History  . Marital Status: Divorced    Spouse Name: N/A  . Number of Children: 3  . Years of Education: N/A   Occupational History  . [employer]    Social History Main Topics  . Smoking status: Never Smoker   . Smokeless tobacco: Never Used  . Alcohol Use: No  . Drug Use: No  . Sexual Activity: Not Asked   Other Topics Concern  . None   Social History Narrative    Filed Vitals:   05/16/16 0952  BP: 128/78  Pulse: 56  Temp: 98.5 F (36.9 C)  Resp: 12    Body mass index is 43.5 kg/(m^2).  SpO2 Readings from Last 3 Encounters:  05/16/16  96%  02/29/16 96%  01/30/16 97%     Physical Exam  Constitutional: She is oriented to person, place, and time. She appears well-developed. No distress.  HENT:  Head: Atraumatic.  Mouth/Throat: Oropharynx is clear and moist and mucous membranes are normal.  Eyes: Conjunctivae and EOM are normal.  Neck: No tracheal deviation present. No thyromegaly present.  Cardiovascular: Regular rhythm.   Occasional extrasystoles are present. Bradycardia present.   No murmur heard. Pulses:      Dorsalis pedis pulses are 2+ on the right side, and 2+ on the left side.  Respiratory: Effort normal and breath sounds normal. No respiratory distress.  GI: Soft. Bowel sounds are normal. She exhibits no mass. There is no hepatomegaly. There is no tenderness.  Musculoskeletal: She exhibits no edema or tenderness.  Knee crepitus bilateral, moderated. Limitation ROM , flexion, L>R. No significant deformity appreciated. No tenderness upon palpation of paraspinal muscles.     Lymphadenopathy:    She has no cervical adenopathy.  Neurological: She is alert and oriented to person, place, and time. She has normal strength. Coordination normal.  Stable gait with no assistance.  Skin: Skin is warm. No erythema.  Psychiatric:  Mildly depressed mood noted. Poor groomed, good eye contact.      ASSESSMENT AND PLAN:     Jevonne was seen today for transfer from [facility].  Diagnoses and all orders for this visit:  Constipation, unspecified constipation type  He seems to be chronic back could be aggravated by some of her medications. Continue adequate fiber and fluid intake. Linzess was recommended today, some side effects discussed, dose could be increased according to clinical improvement. Follow-up in 6-8 weeks.  -     linaclotide (LINZESS) 145 MCG CAPS capsule; Take 1 capsule (145 mcg total) by mouth daily before breakfast.  Essential hypertension, benign  Adequately controlled. Recommend continuing  monitoring blood pressure at home as well as pulse. Carvedilol was decreased from 75 mg daily to 50 mg daily (25 mg bid). No changes on Cozaar or amlodipine. Follow-up in 6-8 weeks, before if needed.  -  EKG 12-Lead  Bilateral lower extremity edema  Improved. Decrease Furosemide from daily to daily as needed.  Bradycardia, sinus  EKG today: Sinus bradycardia, LAD, borderline criteria for LVH, possible inferior infarct age undetermined, ? IVCD, unspecific T wave abnormalities. 05/18/2015 bradycardia now present, rest no major changes. 08/2014 Bradycardia present.  BB dose decreased. Instructed to monitor HR at home. Clearly instructed about warning signs, she voices understanding.      -She was instructed to hold on Simvastatin, planning on FLP next OV and will consider another statin. -Caution with Effexor and Tramadol, risk of interaction. Tramadol can also aggravate depression and constipation. - Instructed to take Lorazepam as needed bid.         Betty G. Martinique, MD  [facility]. [place] office.

## 2016-05-16 NOTE — Patient Instructions (Addendum)
   A few things to remember from today's visit:   1. Constipation, unspecified constipation type   Adequate fiber intake + fluids. Some of your medications can aggravate problem.   - linaclotide (LINZESS) 145 MCG CAPS capsule; Take 1 capsule (145 mcg total) by mouth daily before breakfast.  Dispense: 30 capsule; Refill: 1  2. Essential hypertension, benign  - EKG 12-Lead - furosemide (LASIX) 40 MG tablet; Take 0.5 tablets (20 mg total) by mouth daily as needed for edema.  Dispense: 90 tablet; Refill: 0 - losartan (COZAAR) 100 MG tablet; Take 1 tablet (100 mg total) by mouth daily.  Dispense: 90 tablet; Refill: 1 Carvedilol decreased from 3 tabs daily to 2 tabs daily.   3. Bilateral lower extremity edema   DECREASED from daily to as needed.  - furosemide (LASIX) 40 MG tablet; Take 0.5 tablets (20 mg total) by mouth daily as needed for edema.  Dispense: 90 tablet; Refill: 0      Some of your medications have a hist risk for interaction: Tramadol, Effexor and Norvasc with Simvastatin. Stop Simvastatin for now and we will re-check cholesterol next office visit.  6-8weeks f/u. Monitor blood pressure at home. Goal < 150/90. Monitor pulse as well.

## 2016-05-16 NOTE — Progress Notes (Signed)
Pre visit review using our clinic review tool, if applicable. No additional management support is needed unless otherwise documented below in the visit note. 

## 2016-05-19 ENCOUNTER — Other Ambulatory Visit: Payer: Self-pay | Admitting: Oncology

## 2016-05-19 ENCOUNTER — Other Ambulatory Visit: Payer: Self-pay | Admitting: Family Medicine

## 2016-05-20 ENCOUNTER — Other Ambulatory Visit: Payer: Self-pay | Admitting: *Deleted

## 2016-05-20 MED ORDER — TRAMADOL HCL 50 MG PO TABS: 50.0000 mg | ORAL_TABLET | Freq: Two times a day (BID) | ORAL | Status: DC

## 2016-05-20 NOTE — Telephone Encounter (Signed)
Tramadol called into pharmacy

## 2016-05-20 NOTE — Addendum Note (Signed)
Addended by: Kateri Mc E on: 05/20/2016 04:41 PM   Modules accepted: Orders

## 2016-05-23 ENCOUNTER — Encounter: Payer: Self-pay | Admitting: Family Medicine

## 2016-06-01 ENCOUNTER — Other Ambulatory Visit: Payer: Self-pay | Admitting: Family Medicine

## 2016-06-01 DIAGNOSIS — K5909 Other constipation: Secondary | ICD-10-CM

## 2016-06-01 MED ORDER — LINACLOTIDE 290 MCG PO CAPS: 290.0000 ug | ORAL_CAPSULE | Freq: Every day | ORAL | Status: DC

## 2016-06-01 NOTE — Progress Notes (Signed)
Linzess helping with constipation, Rx sent.

## 2016-06-12 ENCOUNTER — Other Ambulatory Visit: Payer: Self-pay | Admitting: Family Medicine

## 2016-06-13 ENCOUNTER — Telehealth: Payer: Self-pay | Admitting: Emergency Medicine

## 2016-06-13 NOTE — Telephone Encounter (Signed)
Pt requesting Lorazepam 1 MG. #60 0 refills.   Last filled 04/24/16.  last seen 05/16/16.   Okay to refill?

## 2016-06-16 NOTE — Telephone Encounter (Signed)
[  According to [place] controlled substance webb site, she has a refill left on Rx given on 04/24/2016.] Please call pharmacy to verify.  If does not Lorazepam 1 mg can be called in to continue 1/2-1 tab bid as needed, # 60/0.  Thanks, BJ

## 2016-06-17 MED ORDER — LORAZEPAM 1 MG PO TABS: ORAL_TABLET | ORAL | Status: DC

## 2016-06-17 NOTE — Addendum Note (Signed)
Addended by: Kateri Mc E on: 06/17/2016 09:26 AM   Modules accepted: Orders

## 2016-06-17 NOTE — Telephone Encounter (Signed)
Called pharmacy & they have no refills on file for the Lorazepam. Rx called in.

## 2016-06-18 ENCOUNTER — Other Ambulatory Visit: Payer: Self-pay | Admitting: General Surgery

## 2016-06-18 DIAGNOSIS — Z853 Personal history of malignant neoplasm of breast: Secondary | ICD-10-CM

## 2016-07-04 ENCOUNTER — Encounter: Payer: Medicare Other | Attending: General Surgery | Admitting: Dietician

## 2016-07-04 DIAGNOSIS — Z6841 Body Mass Index (BMI) 40.0 and over, adult: Secondary | ICD-10-CM | POA: Insufficient documentation

## 2016-07-04 DIAGNOSIS — Z713 Dietary counseling and surveillance: Secondary | ICD-10-CM | POA: Insufficient documentation

## 2016-07-04 NOTE — Progress Notes (Signed)
  Follow-up visit:  5.5 months Post-Operative Sleeve gastrectomy Surgery  Medical Nutrition Therapy:  Appt start time: [id number] end time:  950  Primary concerns today: Post-operative Bariatric Surgery Nutrition Management. Tamara [name] returns today having lost another 24 pounds since last visit. She reports she is still using protein shakes to supplement protein needs but may drink less if she is able to eat more that day. Started taking Linzess and notices that she is having to use the bathroom up to 3x a day. Plans to discuss this with her PCP. Has a hard time tolerating salads.     Samples provided and patient instructed on proper use: Celebrate calcium chew (caramel - qty 4) Lot#: [id number] Exp: 01/2017  Celebrate iron chew (raspberry - qty 4) Lot#: [id number]  Exp: 01/2018  Celebrate multivitamin chew (very berry - qty 4) Lot#: [id number] Exp: 01/2017   Non scale victories: getting off the couch easily, putting socks on, trimming toenails    Surgery date: 01/28/2016 Surgery type: sleeve gastrectomy [facility] weight at [facility]: 317 lbs on 07/13/2015 Weight today: 253.9 lbs Weight change: 24 lbs Total weight lost: 63 lbs  Goal: below 200 lbs   Preferred Learning Style:   No preference indicated   Learning Readiness:   Ready  24-hr recall:  *Usually drinks 3 Premier protein shakes spaced out throughout the day.  B (9AM): scrambled egg  Snk (AM):  L (PM): broth or cream of chicken soup Snk (PM):  D (PM): Fit and Active or [place] yogurt Snk (PM):   Fluid intake: 33-44 oz protein shake, 24 oz water, decaf coffee, crystal light, sugar free jello (~48 oz total fluid per day) Estimated total protein intake: 100+ grams per day  Medications: see list, no longer on Lasix, Potassium, and statin Supplementation: taking in liquid form , recently ran out  Using straws: no Drinking while eating: no Hair loss: yes Carbonated beverages: none N/V/D/C: constipation resolved with  Linzess Dumping syndrome: no  Recent physical activity:  Walking as tolerated  Progress Towards Goal(s):  In progress.  Handouts given during visit include:  none   Nutritional Diagnosis:  [place]-3.3 Overweight/obesity related to past poor dietary habits and physical inactivity as evidenced by patient w/ recent sleeve gastrectomy surgery following dietary guidelines for continued weight loss.     Intervention:  Nutrition counseling/diet advancement provided.  Teaching Method Utilized:  Visual Auditory Hands on  Barriers to learning/adherence to lifestyle change: none  Demonstrated degree of understanding via:  Teach Back   Monitoring/Evaluation:  Dietary intake, exercise, and body weight. Follow up in 6 months for 12 month post-op visit.

## 2016-07-04 NOTE — Patient Instructions (Addendum)
-  Work on Freight forwarder out protein shakes as you are able to eat more food -Keep walking as tolerated -Keep listening to your body and keep up the good work! -Have just a few bites of fruit on the days you walk  Celebrate brand can be found at the [employer]  Pink=multivitamin Orange=iron *Take multivitamin and iron together  Green=calcium  Take 3 calcium a day (2 hours apart and 2 hours apart from vitamin and iron)    Surgery date: 01/28/2016 Surgery type: sleeve gastrectomy Start weight at [facility]: 317 lbs on 07/13/2015 Weight today: 253.9 lbs Weight change: 24 lbs Total weight lost: 63 lbs  Goal: below 200 lbs

## 2016-07-07 ENCOUNTER — Other Ambulatory Visit: Payer: Self-pay | Admitting: Oncology

## 2016-07-11 ENCOUNTER — Ambulatory Visit (INDEPENDENT_AMBULATORY_CARE_PROVIDER_SITE_OTHER): Payer: Medicare Other | Admitting: Family Medicine

## 2016-07-11 ENCOUNTER — Encounter: Payer: Self-pay | Admitting: Family Medicine

## 2016-07-11 VITALS — BP 135/82 | HR 69 | Resp 12 | Ht 66.0 in | Wt 253.4 lb

## 2016-07-11 DIAGNOSIS — F418 Other specified anxiety disorders: Secondary | ICD-10-CM | POA: Diagnosis not present

## 2016-07-11 DIAGNOSIS — F419 Anxiety disorder, unspecified: Secondary | ICD-10-CM

## 2016-07-11 DIAGNOSIS — K5900 Constipation, unspecified: Secondary | ICD-10-CM

## 2016-07-11 DIAGNOSIS — G4700 Insomnia, unspecified: Secondary | ICD-10-CM

## 2016-07-11 DIAGNOSIS — F329 Major depressive disorder, single episode, unspecified: Secondary | ICD-10-CM | POA: Insufficient documentation

## 2016-07-11 DIAGNOSIS — I10 Essential (primary) hypertension: Secondary | ICD-10-CM | POA: Diagnosis not present

## 2016-07-11 DIAGNOSIS — M159 Polyosteoarthritis, unspecified: Secondary | ICD-10-CM

## 2016-07-11 MED ORDER — MELATONIN ER 5 MG PO TBCR: 1.0000 | EXTENDED_RELEASE_TABLET | Freq: Every day | ORAL | 3 refills | Status: DC

## 2016-07-11 MED ORDER — LINACLOTIDE 290 MCG PO CAPS: 290.0000 ug | ORAL_CAPSULE | Freq: Every day | ORAL | 3 refills | Status: DC

## 2016-07-11 MED ORDER — BUPROPION HCL 75 MG PO TABS: 75.0000 mg | ORAL_TABLET | Freq: Two times a day (BID) | ORAL | 1 refills | Status: DC

## 2016-07-11 MED ORDER — AMLODIPINE BESYLATE 5 MG PO TABS
5.0000 mg | ORAL_TABLET | Freq: Every day | ORAL | 2 refills | Status: DC
Start: 2016-07-11 — End: 2016-07-21

## 2016-07-11 NOTE — Progress Notes (Signed)
HPI:   Tamara [name] is a 65 y.o. female, who is here today to follow on HTN, constipation, and some of ehr chronic medical problems.  She has history of chronic pain, after her last visit, 05/16/2016, she called back requesting a refill for Tramadol.   She takes Tramadol 50 mg bid for arthralgias, knee pain mainly. She also has intermittent back pain and right shoulder pain, mild. She states that her knee pain has been doing better, she attributes it to weight loss, so she has not taken Tramadol as often as she did.  Hx of anxiety and depression. Today she is reporting difficulty sleeping, at least 2 times per week she cannot sleep at all. She has history of OSA, according to patient it was recommended OTC melatonin but doesn't seem to help. She is also on Gabapentin 300 mg at night, she is not sure about indication, started by her oncologist. She has Hx of RLS (per problem list).  Currently she is on Effexor XR 150 mg daily and Lorazepam. She doesn't feel like her medication is helping, she cannot specify symptoms, denies suicidal thoughts.  Her oncologist started Effexor 150 mg, which she has taken for 2 years. She was on Paxil before but discontinued because caused wt gain. Denies Hx of bipolar disorder, no prior psychiatrists evaluation.  Hypertension:   Last office visit, 05/16/2016, Carvedilol was decreased to 25 mg twice daily because bradycardia. She is checking BP at home, she brought some readings: most DBP's 130-140's, a few 150-160's. DBP 60-70's, a few in the 80's. HR most in the mid 50's, a few mid 40's (4 out of 25 readings) and a couple 60's. She is also on Amlodipine 2.5 mg daily and Cozaar 100 mg daily. She is also on Catapres patch, according to pt, it was prescribed by oncologists for hot flashes and headache.  She has not noted unusual headache, visual changes, exertional chest pain, dyspnea,  focal weakness, or edema. Denies dizziness of  lightheadedness.  Lab Results  Component Value Date   CREATININE 0.9 02/13/2016   BUN 30.8 (H) 02/13/2016   NA 140 02/13/2016   K 4.4 02/13/2016   CL 100 02/13/2016   CO2 28 02/13/2016    Also last office visit Linzess was started, initially she took 145 g but didn't help at all, so increased to 290 g. She is reporting greatly improvement of constipation, she has daily bowel movements, sometimes loose, denies fecal incontinence or abdominal pain. She has not seen blood in the stool.   Review of Systems  Constitutional: Positive for fatigue. Negative for activity change, appetite change, fever and unexpected weight change.  HENT: Negative for mouth sores, nosebleeds and trouble swallowing.   Eyes: Negative for redness and visual disturbance.  Respiratory: Negative for cough, shortness of breath and wheezing.   Cardiovascular: Negative for chest pain, palpitations and leg swelling.  Gastrointestinal: Negative for abdominal pain, constipation, nausea and vomiting.  Genitourinary: Negative for decreased urine volume, difficulty urinating, dysuria and hematuria.  Musculoskeletal: Positive for arthralgias and back pain.  Neurological: Negative for dizziness, seizures, syncope, weakness, light-headedness and headaches.  Psychiatric/Behavioral: Positive for sleep disturbance. Negative for confusion, hallucinations and suicidal ideas. The patient is nervous/anxious.       Current Outpatient Prescriptions on File Prior to Visit  Medication Sig Dispense Refill  . albuterol (PROVENTIL HFA;VENTOLIN HFA) 108 (90 BASE) MCG/ACT inhaler Inhale 2 puffs into the lungs every 6 (six) hours as needed for wheezing  or shortness of breath. 2 Inhaler 3  . anastrozole (ARIMIDEX) 1 MG tablet Take 1 tablet (1 mg total) by mouth daily. 90 tablet 3  . aspirin 81 MG tablet Take 81 mg by mouth daily.      . Calcium Carbonate-Vitamin D (CALCIUM-VITAMIN D) 500-200 MG-UNIT tablet Take 1 tablet by mouth daily.    .  carvedilol (COREG) 25 MG tablet Take 1 tablet (25 mg total) by mouth 2 (two) times daily with a meal. 180 tablet 0  . clindamycin (CLINDAGEL) 1 % gel Apply topically 2 (two) times daily. 30 g 0  . cloNIDine (CATAPRES - DOSED IN MG/24 HR) 0.1 mg/24hr patch APPLY ONE PATCH TOPICALLY ONCE A WEEK USE FOR HEADACHES 4 patch 0  . Cyanocobalamin (B-12 PO) Take by mouth. 1/2 teaspoon    . diphenhydramine-acetaminophen (TYLENOL PM) 25-500 MG TABS Take 2 tablets by mouth at bedtime as needed (sleep).    . divalproex (DEPAKOTE ER) 250 MG 24 hr tablet TAKE ONE TABLET BY MOUTH ONCE DAILY 100 tablet 3  . fluticasone (CUTIVATE) 0.05 % cream Apply 1 application topically as needed (rash). 30 g 1  . furosemide (LASIX) 40 MG tablet Take 0.5 tablets (20 mg total) by mouth daily as needed for edema. 90 tablet 0  . gabapentin (NEURONTIN) 300 MG capsule Take 1 capsule (300 mg total) by mouth at bedtime. 90 capsule 3  . ketoconazole (NIZORAL) 2 % cream Apply 1 application topically daily as needed (rash). 15 g 4  . LORazepam (ATIVAN) 1 MG tablet Take 1/2-1 tablet by mouth twice daily as needed. 60 tablet 0  . losartan (COZAAR) 100 MG tablet TAKE ONE TABLET BY MOUTH ONCE DAILY 90 tablet 0  . OVER THE COUNTER MEDICATION flintstone with iron    . pantoprazole (PROTONIX) 40 MG tablet Take 40 mg by mouth daily.    . Pediatric Multiple Vitamins (MULTIVITAMIN) LIQD Take 5 mLs by mouth daily.    . traMADol (ULTRAM) 50 MG tablet Take 1 tablet (50 mg total) by mouth 2 (two) times daily. 60 tablet 0  . venlafaxine XR (EFFEXOR-XR) 150 MG 24 hr capsule Take 1 capsule (150 mg total) by mouth daily with breakfast. 30 capsule 3   No current facility-administered medications on file prior to visit.      Past Medical History:  Diagnosis Date  . Arthritis    knees   . Asthma    rare;only when around alot of dust-Ventolin inhaler as needed  . Breast cancer ([place])     left. States she did not have lymph nodes removed  . Bursitis of  right shoulder   . Cellulitis and abscess of leg   . Complication of anesthesia    was told 01/06/14 that airway was small  . Depression    takes Paxil daily  . Hearing loss   . History of bronchitis 1966  . Hyperlipidemia    takes Zocor nightly  . Hypertension    takes Coreg and Losartan daily  . Joint pain   . Joint swelling   . Obese   . Peripheral edema    takes Furosemide daily as needed and Potassium daily  . Radiation 03/08/14-04/26/14   50.4 gray to left breast. Lumpectomy cavity boosted to 64.4 gray  . Restless legs syndrome    takes depakote  . Sleep apnea, obstructive    uses CPAP  . Wears glasses    No Known Allergies  Social History   Social History  . Marital  status: Divorced    Spouse name: N/A  . Number of children: 3  . 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 Smoking status: Never Smoker  . Smokeless tobacco: Never Used  . Alcohol use No  . Drug use: No  . Sexual activity: Not Asked   Other Topics Concern  . None   Social History Narrative  . None    Vitals:   07/11/16 1041  BP: 135/82  Pulse: 69  Resp: 12   Body mass index is 40.9 kg/m.  O2 sat at RA 96%.    Physical Exam  Nursing note and vitals reviewed. Constitutional: She is oriented to person, place, and time. She appears well-developed. No distress.  HENT:  Head: Atraumatic.  Mouth/Throat: Oropharynx is clear and moist and mucous membranes are normal.  Eyes: Conjunctivae and EOM are normal.  Cardiovascular: Normal rate and regular rhythm.   No murmur heard. Pulses:      Dorsalis pedis pulses are 2+ on the right side, and 2+ on the left side.  Respiratory: Effort normal and breath sounds normal. No respiratory distress.  GI: Bowel sounds are normal.  Musculoskeletal: She exhibits edema (trace pitting LE edema bilateral.). She exhibits no tenderness.  Shoulders adequate ROM, mild pain  right shoulder with movement. Knee crepitus bilateral, moderated. Mild limitation ROM , flexion, no pain elicited.    Neurological: She is alert and oriented to person, place, and time. She has normal strength. Coordination normal.  Stable gait with no assistance.  Skin: Skin is warm. No erythema.  Psychiatric:  Mildly depressed mood noted. Well groomed, good eye contact.      ASSESSMENT AND PLAN:     Faydra was seen today for follow-up.  Diagnoses and all orders for this visit:  Constipation, unspecified constipation type  Improved. No changes in current management, caution with dehydration. Follow up in 12 months, before if needed.  -     linaclotide (LINZESS) 290 MCG CAPS capsule; Take 1 capsule (290 mcg total) by mouth daily before breakfast.  Essential hypertension  BP here adequately controlled, she still having some elevated his BP at home, Amlodipine increased from 2.5 mg to 5 mg. For now no changes on Carvedilol or Cozaar, consider decreasing carvedilol next office visit depending of BP and heart rate. Low salt diet. Continue monitoring BP and heart rate at home. Follow-up in 2 months.  -     amLODipine (NORVASC) 5 MG tablet; Take 1 tablet (5 mg total) by mouth daily.  Insomnia, unspecified  Good sleep hygiene. A new prescription for Melatonin sent to pharmacy. Recommended increasing gabapentin dose from 300-600 mg when she has trouble falling asleep, which is usually twice per week. F/U in 2 months.  -     Melatonin ER 5 MG TBCR; Take 1 tablet by mouth at bedtime.  Anxiety and depression  Still not well controlled. ? Bipolar. For now no changes on lorazepam or Effexor. She agrees with trying Wellbutrin, low dose twice per day. Instructed about warning signs. Follow-up in 8 weeks, before if needed.  -     buPROPion (WELLBUTRIN) 75 MG tablet; Take 1 tablet (75 mg total) by mouth 2 (two) times daily.  Generalized osteoarthritis of multiple  sites  Stable overall. For now she will continue Tramadol 50 mg twice per day as needed. She understands some of the side effects and the risk of  interaction with some of her medications.         -Ms. ROZLYNN ROCKEFELLER was advised to return sooner than planned today if new concerns arise.       Spenser Cong G. Martinique, MD  [facility]. [place] office.

## 2016-07-11 NOTE — Patient Instructions (Addendum)
A few things to remember from today's visit:   Essential hypertension  Constipation, unspecified constipation type - Plan: linaclotide (LINZESS) 290 MCG CAPS capsule  Insomnia, unspecified - Plan: Melatonin ER 5 MG TBCR  Anxiety and depression - Plan: buPROPion (WELLBUTRIN) 75 MG tablet  Generalized osteoarthritis of multiple sites  Wellbutrin added today. Continue Effexor 150 mg daily.  Increase gabapentin to 2 capsules as needed for sleep. Melatonin sent.  Amlodipine increased from 2.5 mg to 5 mg. For now no changes on Coreg will be decreased depending of blood pressure. Continue monitoring heart rate.  Please be sure medication list is accurate. If a new problem present, please set up appointment sooner than planned today.

## 2016-07-14 ENCOUNTER — Telehealth: Payer: Self-pay | Admitting: Family Medicine

## 2016-07-14 ENCOUNTER — Ambulatory Visit (INDEPENDENT_AMBULATORY_CARE_PROVIDER_SITE_OTHER): Payer: Medicare Other | Admitting: Family Medicine

## 2016-07-14 ENCOUNTER — Encounter: Payer: Self-pay | Admitting: Family Medicine

## 2016-07-14 VITALS — BP 128/80 | HR 76 | Temp 98.5°F | Ht 66.0 in | Wt 256.7 lb

## 2016-07-14 DIAGNOSIS — R42 Dizziness and giddiness: Secondary | ICD-10-CM | POA: Diagnosis not present

## 2016-07-14 DIAGNOSIS — I10 Essential (primary) hypertension: Secondary | ICD-10-CM

## 2016-07-14 NOTE — Telephone Encounter (Signed)
Okay for transfer to [place]?

## 2016-07-14 NOTE — Progress Notes (Signed)
HPI:  Tamara [name] is a pleasant 65 yo here for an acute visit for dizziness. Brief episodes that occurred twice today when she suddenly stood up she noticed that she felt off balance and slightly lightheaded. She lost her balance with one of these episodes and fell, but reports that she did not suffer any injury. LOC, nor has any residual pain from this. She did bump her head on the chair as she fell but does not have a headache or pain or any symptoms from this. She recently changed several of her medications including increasing her Norvasc and her gabapentin. She also reports a long history of positional vertigo and reports on your nose and throat doctor in the past and still has symptoms occasionally with certain movements.  Denies vision changes, chest pain, shortness of breath, palpitations, speech changes, weakness, numbness, headache, fevers or malaise.  ROS: See pertinent positives and negatives per HPI.  Past Medical History:  Diagnosis Date  . Arthritis    knees   . Asthma    rare;only when around alot of dust-Ventolin inhaler as needed  . Breast cancer ([place])     left. States she did not have lymph nodes removed  . Bursitis of right shoulder   . Cellulitis and abscess of leg   . Complication of anesthesia    was told 01/06/14 that airway was small  . Depression    takes Paxil daily  . Hearing loss   . History of bronchitis 1966  . Hyperlipidemia    takes Zocor nightly  . Hypertension    takes Coreg and Losartan daily  . Joint pain   . Joint swelling   . Obese   . Peripheral edema    takes Furosemide daily as needed and Potassium daily  . Radiation 03/08/14-04/26/14   50.4 gray to left breast. Lumpectomy cavity boosted to 64.4 gray  . Restless legs syndrome    takes depakote  . Sleep apnea, obstructive    uses CPAP  . Wears glasses     Past Surgical History:  Procedure Laterality Date  . BREAST LUMPECTOMY WITH NEEDLE LOCALIZATION Left 01/24/2014   Procedure:  BREAST LUMPECTOMY WITH NEEDLE LOCALIZATION;  Surgeon: Edward Jolly, MD;  Location: [place];  Service: General;  Laterality: Left;  . COLONOSCOPY    . DILATATION & CURETTAGE/HYSTEROSCOPY WITH TRUECLEAR N/A 01/06/2014   Procedure: DILATATION & CURETTAGE/HYSTEROSCOPY WITH TRUCLEAR;  Surgeon: Shon Millet II, MD;  Location: [facility];  Service: Gynecology;  Laterality: N/A;  . HYSTEROPLASTY  01/2014  . INNER EAR SURGERY Bilateral    for hearing loss  . JOINT REPLACEMENT Right    Knee  . LAPAROSCOPIC GASTRIC SLEEVE RESECTION N/A 01/28/2016   Procedure: LAPAROSCOPIC GASTRIC SLEEVE RESECTION WITH UPPER ENDO;  Surgeon: Excell Seltzer, MD;  Location: [facility];  Service: General;  Laterality: N/A;  . RE-EXCISION OF BREAST LUMPECTOMY Left 02/02/2014   Procedure: RE-EXCISION OF LEFT BREAST LUMPECTOMY;  Surgeon: Edward Jolly, MD;  Location: [place];  Service: General;  Laterality: Left;  . TUBAL LIGATION      Family History  Problem Relation Age of Onset  . Dementia Mother   . Heart attack Mother   . Asthma Other   . Hypertension Other   . Thyroid disease Other   . Heart attack Other   . Esophageal cancer Father     also had stomach cancer  . Heart attack Father   . Throat cancer Paternal Grandfather  Social History   Social History  . Marital status: Divorced    Spouse name: N/A  . Number of children: 3  . 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 Smoking status: Never Smoker  . Smokeless tobacco: Never Used  . Alcohol use No  . Drug use: No  . Sexual activity: Not Asked   Other Topics Concern  . None   Social History Narrative  . None     Current Outpatient Prescriptions:  .  albuterol (PROVENTIL HFA;VENTOLIN HFA) 108 (90 BASE) MCG/ACT inhaler, Inhale 2 puffs into the lungs every 6 (six) hours as needed for wheezing or shortness of breath., Disp: 2 Inhaler, Rfl: 3 .  amLODipine  (NORVASC) 5 MG tablet, Take 1 tablet (5 mg total) by mouth daily., Disp: 90 tablet, Rfl: 2 .  anastrozole (ARIMIDEX) 1 MG tablet, Take 1 tablet (1 mg total) by mouth daily., Disp: 90 tablet, Rfl: 3 .  aspirin 81 MG tablet, Take 81 mg by mouth daily.  , Disp: , Rfl:  .  buPROPion (WELLBUTRIN) 75 MG tablet, Take 1 tablet (75 mg total) by mouth 2 (two) times daily., Disp: 60 tablet, Rfl: 1 .  Calcium Carbonate-Vitamin D (CALCIUM-VITAMIN D) 500-200 MG-UNIT tablet, Take 1 tablet by mouth daily., Disp: , Rfl:  .  carvedilol (COREG) 25 MG tablet, Take 1 tablet (25 mg total) by mouth 2 (two) times daily with a meal., Disp: 180 tablet, Rfl: 0 .  clindamycin (CLINDAGEL) 1 % gel, Apply topically 2 (two) times daily., Disp: 30 g, Rfl: 0 .  cloNIDine (CATAPRES - DOSED IN MG/24 HR) 0.1 mg/24hr patch, APPLY ONE PATCH TOPICALLY ONCE A WEEK USE FOR HEADACHES, Disp: 4 patch, Rfl: 0 .  Cyanocobalamin (B-12 PO), Take by mouth. 1/2 teaspoon, Disp: , Rfl:  .  diphenhydramine-acetaminophen (TYLENOL PM) 25-500 MG TABS, Take 2 tablets by mouth at bedtime as needed (sleep)., Disp: , Rfl:  .  divalproex (DEPAKOTE ER) 250 MG 24 hr tablet, TAKE ONE TABLET BY MOUTH ONCE DAILY, Disp: 100 tablet, Rfl: 3 .  fluticasone (CUTIVATE) 0.05 % cream, Apply 1 application topically as needed (rash)., Disp: 30 g, Rfl: 1 .  furosemide (LASIX) 40 MG tablet, Take 0.5 tablets (20 mg total) by mouth daily as needed for edema., Disp: 90 tablet, Rfl: 0 .  gabapentin (NEURONTIN) 300 MG capsule, Take 1 capsule (300 mg total) by mouth at bedtime., Disp: 90 capsule, Rfl: 3 .  ketoconazole (NIZORAL) 2 % cream, Apply 1 application topically daily as needed (rash)., Disp: 15 g, Rfl: 4 .  linaclotide (LINZESS) 290 MCG CAPS capsule, Take 1 capsule (290 mcg total) by mouth daily before breakfast., Disp: 90 capsule, Rfl: 3 .  LORazepam (ATIVAN) 1 MG tablet, Take 1/2-1 tablet by mouth twice daily as needed., Disp: 60 tablet, Rfl: 0 .  losartan (COZAAR) 100 MG  tablet, TAKE ONE TABLET BY MOUTH ONCE DAILY, Disp: 90 tablet, Rfl: 0 .  Melatonin ER 5 MG TBCR, Take 1 tablet by mouth at bedtime., Disp: 90 tablet, Rfl: 3 .  OVER THE COUNTER MEDICATION, flintstone with iron, Disp: , Rfl:  .  pantoprazole (PROTONIX) 40 MG tablet, Take 40 mg by mouth daily., Disp: , Rfl:  .  Pediatric Multiple Vitamins (MULTIVITAMIN) LIQD, Take 5 mLs by mouth daily., Disp: , Rfl:  .  traMADol (ULTRAM) 50 MG tablet, Take 1 tablet (50 mg total) by  mouth 2 (two) times daily., Disp: 60 tablet, Rfl: 0 .  venlafaxine XR (EFFEXOR-XR) 150 MG 24 hr capsule, Take 1 capsule (150 mg total) by mouth daily with breakfast., Disp: 30 capsule, Rfl: 3  EXAM:  Vitals:   07/14/16 1303  BP: 128/80  Pulse: 76  Temp: 98.5 F (36.9 C)  Her blood pressure is 128/80 lying down, going to 104/78 with standing with a slight but in significant increase in heart rate.  Body mass index is 41.43 kg/m.  GENERAL: vitals reviewed and listed above, alert, oriented, appears well hydrated and in no acute distress  HEENT: atraumatic,  take or signs of trauma anywhere to be found, conjunttiva clear, PERRLA, no obvious abnormalities on inspection of external nose and ears  NECK: no obvious masses on inspection  LUNGS: clear to auscultation bilaterally, no wheezes, rales or rhonchi, good air movement  CV: HRRR  MS: moves all extremities without noticeable abnormality  PSYCH/NEURO:  cranial nerves II through XII grossly intact, finger to nose normal, speech and thought processing grossly intact, pleasant and cooperative, no obvious depression or anxiety, Dix [place]pike is negative  ASSESSMENT AND PLAN:  Discussed the following assessment and plan:  Dizziness  Essential hypertension  Vertigo  -we discussed possible serious and likely etiologies, workup and treatment, treatment risks and return precautions -Several potential contributing factors, suspect related to recent medication changes and some  mild orthostasis and opted given blood pressure on the lower end today to stick with Norvasc 2.5 for now with close follow-up with her PCP 1 week - hx of reported positional vertigo, neg testing today for this -no signs of trauma or complaints from the fall -Advised caution with ambulation, walker use advised in the interim -Patient advised to return or notify a doctor immediately if symptoms worsen or persist or new concerns arise.  Patient Instructions  BEFORE YOU LEAVE: -follow up: with Dr. Neita Garnet in about 1 week  Keep the norvasc at 2.5 mg daily.  Be very careful when standing.  If continue to feel dizzy back down on the gabapentin.  Seek care immediately if severe headache, speech issues, vision problems, worsening significantly, other concerns.   Colin Benton R., DO

## 2016-07-14 NOTE — Telephone Encounter (Signed)
Okay to establish care. Recommend that she follow up with Dr. Martinique in one week as indicated by her visit today for her BP recheck if scheduled established visit will be more than one week out.

## 2016-07-14 NOTE — Progress Notes (Signed)
Pre visit review using our clinic review tool, if applicable. No additional management support is needed unless otherwise documented below in the visit note. 

## 2016-07-14 NOTE — Telephone Encounter (Signed)
Pt would like to switch to julia,because language barrier

## 2016-07-14 NOTE — Patient Instructions (Addendum)
BEFORE YOU LEAVE: -follow up: with Dr. Neita Garnet in about 1 week  Keep the norvasc at 2.5 mg daily.  Be very careful when standing.  If continue to feel dizzy back down on the gabapentin.  Seek care immediately if severe headache, speech issues, vision problems, worsening significantly, other concerns.

## 2016-07-15 NOTE — Telephone Encounter (Signed)
I am glad I could care for her yesterday and am honored by her request. However, I think they let her know yesterday that I am full. Would advise she keep scheduled appt with betty for f/u and establish with Gregary Signs per prior wishes. Thanks.

## 2016-07-15 NOTE — Telephone Encounter (Signed)
It is ok to arrange appt with Gregary Signs to establish care. Thanks, BJ

## 2016-07-15 NOTE — Telephone Encounter (Signed)
Called and left a message for pt to return my call. To update her on Julia's Request.

## 2016-07-15 NOTE — Telephone Encounter (Signed)
Called and spoke to pt. I informed her that it would be okay to establish care with Gregary Signs, after her BP follow up with Dr. Martinique. Pt stated that she wanted to establish care with Dr.Kim not Gregary Signs. Dr. Maudie Mercury was the one who seen her yesterday. I explained to pt that I would forward the message to Dr.Kims assistant an someone would get back to her. Pt verbalized understanding.

## 2016-07-15 NOTE — Telephone Encounter (Signed)
[place] pt returned your call

## 2016-07-15 NOTE — Telephone Encounter (Signed)
I called the pt and informed her of the message below and she agreed. 

## 2016-07-21 ENCOUNTER — Ambulatory Visit (INDEPENDENT_AMBULATORY_CARE_PROVIDER_SITE_OTHER): Payer: Medicare Other | Admitting: Family Medicine

## 2016-07-21 ENCOUNTER — Encounter: Payer: Self-pay | Admitting: Family Medicine

## 2016-07-21 VITALS — BP 140/90 | HR 58 | Resp 12 | Ht 66.0 in | Wt 255.2 lb

## 2016-07-21 DIAGNOSIS — R001 Bradycardia, unspecified: Secondary | ICD-10-CM | POA: Diagnosis not present

## 2016-07-21 DIAGNOSIS — R42 Dizziness and giddiness: Secondary | ICD-10-CM | POA: Diagnosis not present

## 2016-07-21 DIAGNOSIS — G4700 Insomnia, unspecified: Secondary | ICD-10-CM

## 2016-07-21 DIAGNOSIS — I10 Essential (primary) hypertension: Secondary | ICD-10-CM

## 2016-07-21 MED ORDER — CARVEDILOL 12.5 MG PO TABS: 12.5000 mg | ORAL_TABLET | Freq: Two times a day (BID) | ORAL | 3 refills | Status: DC

## 2016-07-21 NOTE — Progress Notes (Signed)
HPI:   [name] is a 65 y.o. female, who is here today to follow on recent office visit.  She was seen on 07/14/16 because worsening dizziness and fall. She was orthostatic, so Amlodipine was decreased from 5 mg to 2.5 mg. She has Hx of vertigo, which she does not think it is similar to dizziness she has had. She follows with ENT as needed.   Also she is taking medications that could cause or aggravate dizziness.  On 07/11/2016 her Gabapentin was increased from 300 mg to 600 mg total to help insomnia. She is reporting improvement on her sleep, no side effects to that she can identify.  Overall she is feeling better in regard to dizziness spells, she had "little" of this is pending this morning after not having any for the past 3-4 days. Usually it last a "few minutes", she has not identified exacerbating or alleviating factors. She denies associated headache, chest pain, dyspnea, palpitations, or mental status changes.  No new symptoms reported.   Hx of HTN. Currently she is on amlodipine 2.5 mg daily, Cozaar 100 mg daily,_take. In June 2017 Coreg was decreased from 25 mg to 12.5 mg twice per day because of bradycardia.  Not sure about Carvedilol dose.  Denies severe/frequent headache, visual changes, chest pain, dyspnea, palpitation, claudication, focal weakness, or edema. She has Hx of headache, bitemporal, no associated symptoms, improved after she started Clonidine patch. Not sure about indication for Depakote.  BPs readings: 134/70, 145/80, 138/69, 105/50. HR mid 50's to low 60s.   History of depression and anxiety, last office visit Wellbutrin low dose was added, she is tolerating well, she has not noted any change in anxiety or depression. She denies suicidal thoughts.  Concerns today: None.   Review of Systems  Constitutional: Positive for fatigue. Negative for activity change, appetite change, fever and unexpected weight change.  HENT: Negative for  mouth sores, nosebleeds and trouble swallowing.   Eyes: Negative for redness and visual disturbance.  Respiratory: Negative for cough, shortness of breath and wheezing.   Cardiovascular: Negative for chest pain, palpitations and leg swelling.  Gastrointestinal: Negative for abdominal pain, constipation, nausea and vomiting.  Genitourinary: Negative for decreased urine volume, difficulty urinating and hematuria.  Musculoskeletal: Positive for arthralgias and back pain.  Neurological: Positive for light-headedness and headaches. Negative for seizures, syncope, speech difficulty and weakness.  Psychiatric/Behavioral: Negative for confusion, hallucinations, sleep disturbance (improved) and suicidal ideas. The patient is nervous/anxious.       Current Outpatient Prescriptions on File Prior to Visit  Medication Sig Dispense Refill  . albuterol (PROVENTIL HFA;VENTOLIN HFA) 108 (90 BASE) MCG/ACT inhaler Inhale 2 puffs into the lungs every 6 (six) hours as needed for wheezing or shortness of breath. 2 Inhaler 3  . anastrozole (ARIMIDEX) 1 MG tablet Take 1 tablet (1 mg total) by mouth daily. 90 tablet 3  . aspirin 81 MG tablet Take 81 mg by mouth daily.      Marland Kitchen buPROPion (WELLBUTRIN) 75 MG tablet Take 1 tablet (75 mg total) by mouth 2 (two) times daily. 60 tablet 1  . Calcium Carbonate-Vitamin D (CALCIUM-VITAMIN D) 500-200 MG-UNIT tablet Take 1 tablet by mouth daily.    . clindamycin (CLINDAGEL) 1 % gel Apply topically 2 (two) times daily. 30 g 0  . cloNIDine (CATAPRES - DOSED IN MG/24 HR) 0.1 mg/24hr patch APPLY ONE PATCH TOPICALLY ONCE A WEEK USE FOR HEADACHES 4 patch 0  . Cyanocobalamin (B-12 PO) Take  by mouth. 1/2 teaspoon    . diphenhydramine-acetaminophen (TYLENOL PM) 25-500 MG TABS Take 2 tablets by mouth at bedtime as needed (sleep).    . divalproex (DEPAKOTE ER) 250 MG 24 hr tablet TAKE ONE TABLET BY MOUTH ONCE DAILY 100 tablet 3  . fluticasone (CUTIVATE) 0.05 % cream Apply 1 application  topically as needed (rash). 30 g 1  . furosemide (LASIX) 40 MG tablet Take 0.5 tablets (20 mg total) by mouth daily as needed for edema. 90 tablet 0  . gabapentin (NEURONTIN) 300 MG capsule Take 1 capsule (300 mg total) by mouth at bedtime. 90 capsule 3  . ketoconazole (NIZORAL) 2 % cream Apply 1 application topically daily as needed (rash). 15 g 4  . linaclotide (LINZESS) 290 MCG CAPS capsule Take 1 capsule (290 mcg total) by mouth daily before breakfast. 90 capsule 3  . LORazepam (ATIVAN) 1 MG tablet Take 1/2-1 tablet by mouth twice daily as needed. 60 tablet 0  . losartan (COZAAR) 100 MG tablet TAKE ONE TABLET BY MOUTH ONCE DAILY 90 tablet 0  . Melatonin ER 5 MG TBCR Take 1 tablet by mouth at bedtime. 90 tablet 3  . OVER THE COUNTER MEDICATION flintstone with iron    . pantoprazole (PROTONIX) 40 MG tablet Take 40 mg by mouth daily.    . Pediatric Multiple Vitamins (MULTIVITAMIN) LIQD Take 5 mLs by mouth daily.    . traMADol (ULTRAM) 50 MG tablet Take 1 tablet (50 mg total) by mouth 2 (two) times daily. 60 tablet 0  . venlafaxine XR (EFFEXOR-XR) 150 MG 24 hr capsule Take 1 capsule (150 mg total) by mouth daily with breakfast. 30 capsule 3   No current facility-administered medications on file prior to visit.      Past Medical History:  Diagnosis Date  . Arthritis    knees   . Asthma    rare;only when around alot of dust-Ventolin inhaler as needed  . Breast cancer ([place])     left. States she did not have lymph nodes removed  . Bursitis of right shoulder   . Cellulitis and abscess of leg   . Complication of anesthesia    was told 01/06/14 that airway was small  . Depression    takes Paxil daily  . Hearing loss   . History of bronchitis 1966  . Hyperlipidemia    takes Zocor nightly  . Hypertension    takes Coreg and Losartan daily  . Joint pain   . Joint swelling   . Obese   . Peripheral edema    takes Furosemide daily as needed and Potassium daily  . Radiation 03/08/14-04/26/14     50.4 gray to left breast. Lumpectomy cavity boosted to 64.4 gray  . Restless legs syndrome    takes depakote  . Sleep apnea, obstructive    uses CPAP  . Wears glasses    No Known Allergies  Social History   Social History  . Marital status: Divorced    Spouse name: N/A  . Number of children: 3  . 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 Smoking status: Never Smoker  . Smokeless tobacco: Never Used  . Alcohol use No  . Drug use: No  . Sexual activity: Not Asked   Other Topics Concern  . None   Social History Narrative  . None    Vitals:   07/21/16 0957  BP: 140/90  Pulse: (!) 58  Resp: 12   Body mass index is 41.2 kg/m.  O2 sat at RA 97%.    Physical Exam  Nursing note and vitals reviewed. Constitutional: She is oriented to person, place, and time. She appears well-developed. She does not appear ill. No distress.  HENT:  Head: Atraumatic.  Mouth/Throat: Oropharynx is clear and moist. Mucous membranes are dry.  Eyes: Conjunctivae and EOM are normal.  Neck: No thyromegaly present.  Cardiovascular: Regular rhythm.  Bradycardia present.   Murmur (? soft SEM RUSB) heard. DP pulses present bilateral  Respiratory: Effort normal and breath sounds normal. No respiratory distress.  GI: Bowel sounds are normal.  Musculoskeletal: She exhibits edema (trace pitting LE edema bilateral.). She exhibits no tenderness.  Lymphadenopathy:    She has no cervical adenopathy.  Neurological: She is alert and oriented to person, place, and time. She has normal strength. No cranial nerve deficit or sensory deficit. She displays a negative Romberg sign. Coordination normal.  Pronator drift negative.  Stable gait with assistance,cane.  Skin: Skin is warm. No erythema.  Psychiatric:  Mildly depressed mood noted. Well groomed, good eye contact.      ASSESSMENT AND PLAN:     Tamara [name]  was seen today for follow-up.  Diagnoses and all orders for this visit:  Dizziness and giddiness  Improved. Possible etiologies were discussed. For now no further recommendations and she was clearly instructed about warning signs. Fall precautions discussed as well.  Essential hypertension  Adequately controlled, goal < 150/90. No changes in Amlodipine or Cozaar. Coreg to decrease by 50% due to bradycardia, she is not sure about how much she is taking. Continue monitoring BP at home. Keep next appt.  -     carvedilol (COREG) 12.5 MG tablet; Take 1 tablet (12.5 mg total) by mouth 2 (two) times daily with a meal.  Insomnia, unspecified  Improved. Good sleep hygiene. No changes in current management.  Bradycardia, sinus  HR 52/min. I would like to decrease carvedilol, she is not sure about dose, so she will let me know and we will recommend decreasing current dose by 50%. Clearly instructed about warning signs. Continue monitoring HR at home. Keep Appointment.        Lealer Marsland G. Martinique, MD  [facility]. [place] office.

## 2016-07-21 NOTE — Patient Instructions (Signed)
A few things to remember from today's visit:   Dizziness and giddiness  Essential hypertension - Plan: carvedilol (COREG) 12.5 MG tablet  Insomnia, unspecified  Bradycardia, sinus   You're supposed to be on carvedilol 12.5 mg 2 times daily. Please review medications at home and let me know if this is correct, if you were taking 12.5 mg, we need to decrease dose to 6.25 mg. New prescription was sent to the pharmacy.   Insomnia seems to be better, no changes in gabapentin or melatonin.   No changes on Wellbutrin. If dizziness gets worse again please let me know from my chart. Continue monitoring blood pressure and pulse at home. Keep next appointment.   Please be sure medication list is accurate. If a new problem present, please set up appointment sooner than planned today.

## 2016-07-22 ENCOUNTER — Ambulatory Visit
Admission: RE | Admit: 2016-07-22 | Discharge: 2016-07-22 | Disposition: A | Discharge status: Home / Self Care | Payer: Medicare Other | Source: Ambulatory Visit | Attending: General Surgery | Admitting: General Surgery

## 2016-07-22 ENCOUNTER — Other Ambulatory Visit: Payer: Self-pay | Admitting: Family Medicine

## 2016-07-22 DIAGNOSIS — Z853 Personal history of malignant neoplasm of breast: Secondary | ICD-10-CM

## 2016-07-22 DIAGNOSIS — R928 Other abnormal and inconclusive findings on diagnostic imaging of breast: Secondary | ICD-10-CM | POA: Diagnosis not present

## 2016-07-22 NOTE — Telephone Encounter (Signed)
She is due for a refill, she also has follow up appt scheduled for October.

## 2016-07-22 NOTE — Telephone Encounter (Signed)
Please Advise  This is the patient we just discussed.

## 2016-07-24 ENCOUNTER — Other Ambulatory Visit: Payer: Self-pay | Admitting: Family Medicine

## 2016-07-24 MED ORDER — LORAZEPAM 1 MG PO TABS: ORAL_TABLET | ORAL | 2 refills | Status: DC

## 2016-08-06 DIAGNOSIS — Z124 Encounter for screening for malignant neoplasm of cervix: Secondary | ICD-10-CM | POA: Diagnosis not present

## 2016-08-06 DIAGNOSIS — Z6841 Body Mass Index (BMI) 40.0 and over, adult: Secondary | ICD-10-CM | POA: Diagnosis not present

## 2016-08-25 ENCOUNTER — Other Ambulatory Visit: Payer: Self-pay | Admitting: Family Medicine

## 2016-08-25 ENCOUNTER — Other Ambulatory Visit: Payer: Self-pay | Admitting: Oncology

## 2016-08-25 ENCOUNTER — Telehealth: Payer: Self-pay

## 2016-08-25 NOTE — Telephone Encounter (Signed)
Rx request:1) Losartan 100 mg #90, 2) Tramadol 50mg  #60, 3) Clindamycin 1% gel  #30 g Last refills: 1 & 2) 05/20/2016,3) 03/28/2016 Last Ov: 07/21/2016 Next Appt: 09/09/2016 Please advise

## 2016-08-26 NOTE — Telephone Encounter (Signed)
Clindamycin I di not prescribed, it was a single rx given elsewhere in 03/2016; not appropriate. Tramadol 50 mg to continue a tab bid as needed can be called in, # 60/1. Cozaar 100 mg to continue once daily can be sent # 90/2.  Thanks, BJ

## 2016-08-27 ENCOUNTER — Other Ambulatory Visit: Payer: Self-pay | Admitting: Family Medicine

## 2016-08-27 MED ORDER — LOSARTAN POTASSIUM 100 MG PO TABS: 100.0000 mg | ORAL_TABLET | Freq: Every day | ORAL | 2 refills | Status: DC

## 2016-08-27 MED ORDER — CLINDAMYCIN PHOSPHATE 1 % EX GEL: Freq: Two times a day (BID) | CUTANEOUS | 0 refills | Status: DC

## 2016-08-27 MED ORDER — CLONIDINE HCL 0.1 MG/24HR TD PTWK: 0.1000 mg | MEDICATED_PATCH | TRANSDERMAL | 0 refills | Status: DC

## 2016-08-27 MED ORDER — TRAMADOL HCL 50 MG PO TABS: 50.0000 mg | ORAL_TABLET | Freq: Two times a day (BID) | ORAL | 1 refills | Status: DC

## 2016-08-27 NOTE — Addendum Note (Signed)
Addended by: Kateri Mc E on: 08/27/2016 09:27 AM   Modules accepted: Orders

## 2016-08-27 NOTE — Telephone Encounter (Signed)
rxs sent

## 2016-09-09 ENCOUNTER — Ambulatory Visit (INDEPENDENT_AMBULATORY_CARE_PROVIDER_SITE_OTHER): Payer: Medicare Other | Admitting: Family Medicine

## 2016-09-09 ENCOUNTER — Encounter: Payer: Self-pay | Admitting: Family Medicine

## 2016-09-09 VITALS — BP 140/90 | HR 80 | Resp 12 | Ht 66.0 in | Wt 249.1 lb

## 2016-09-09 DIAGNOSIS — Z23 Encounter for immunization: Secondary | ICD-10-CM | POA: Diagnosis not present

## 2016-09-09 DIAGNOSIS — I10 Essential (primary) hypertension: Secondary | ICD-10-CM | POA: Diagnosis not present

## 2016-09-09 DIAGNOSIS — F419 Anxiety disorder, unspecified: Secondary | ICD-10-CM

## 2016-09-09 DIAGNOSIS — F329 Major depressive disorder, single episode, unspecified: Secondary | ICD-10-CM

## 2016-09-09 DIAGNOSIS — G4700 Insomnia, unspecified: Secondary | ICD-10-CM

## 2016-09-09 DIAGNOSIS — G2581 Restless legs syndrome: Secondary | ICD-10-CM

## 2016-09-09 DIAGNOSIS — M159 Polyosteoarthritis, unspecified: Secondary | ICD-10-CM

## 2016-09-09 DIAGNOSIS — F418 Other specified anxiety disorders: Secondary | ICD-10-CM

## 2016-09-09 MED ORDER — GABAPENTIN 300 MG PO CAPS: 600.0000 mg | ORAL_CAPSULE | Freq: Every day | ORAL | 3 refills | Status: DC

## 2016-09-09 MED ORDER — HYDROCODONE-ACETAMINOPHEN 5-325 MG PO TABS: 1.0000 | ORAL_TABLET | Freq: Two times a day (BID) | ORAL | 0 refills | Status: DC | PRN

## 2016-09-09 MED ORDER — BUPROPION HCL ER (SR) 100 MG PO TB12: 100.0000 mg | ORAL_TABLET | Freq: Two times a day (BID) | ORAL | 1 refills | Status: DC

## 2016-09-09 MED ORDER — HYDROCODONE-ACETAMINOPHEN 5-325 MG PO TABS: 1.0000 | ORAL_TABLET | Freq: Three times a day (TID) | ORAL | 0 refills | Status: DC | PRN

## 2016-09-09 MED ORDER — DIVALPROEX SODIUM ER 250 MG PO TB24: ORAL_TABLET | ORAL | 0 refills | Status: DC

## 2016-09-09 NOTE — Progress Notes (Signed)
HPI:   [name] is a 65 y.o. female, who is here today to follow on some of her chronic medical problems. I last saw her on 07/21/16 for acute follow up visit.Marland Kitchen   Hypertension: Currently she is on Carvedilol 12.5 mg bid , Cozaar 100 mg taking it once daily, and Amlodipine 2.5 mg daily.  She is also on Catapres patch, which was prescribed for treatment of hot flashes.  Reporting no side effects from medication. She has not had headache, visual changes, chest pain, dyspnea, palpitation, abdominal pain, nausea, vomiting, or worsening edema edema. Takes Furosemide as needed for LE edema, has not needed it in months.  She has some BP readings: 120-130s/70's, occasionally SBP in the low 140s. Coreg was decreased from 25 mg 3 times per day, currently she is taking 12.5 mg twice a day, bradycardia and fatigue have improved greatly, HR readings most in the 60s, a few high 50s.  She denies orthopnea or PND.   Lab Results  Component Value Date   CREATININE 0.9 02/13/2016   BUN 30.8 (H) 02/13/2016   NA 140 02/13/2016   K 4.4 02/13/2016   CL 100 02/13/2016   CO2 28 02/13/2016     Depression and anxiety: Last OV, alkaline phosphatase related to thousand 17 Wellbutrin 75 mg twice daily was added to her Effexor XL 150 mg daily. She states that she has not noted major changes, she denies side effects or worsening depression. According to patient, her worse symptom is low motivation, she prefers to stay in her house the whole day and avoids getting involved in social gatherings. She is trying to look for a part-time job, she feels like it will help with depression and anxiety. She doesn't go to church regularly. Her kids live close by. She lives alone. She is also on Ativan 1 mg 1/2-1 tab bid as needed.  Reporting anxiety as stable. She denies any history of bipolar disorder. Denies suicidal thoughts or sleep changes.  History of insomnia and RLS, she thinks she is taking  Depakote for restless legs syndrome, she has been taken it for years. A couple months ago Gabapentin was increased from 300 mg to 600 mg, she is reporting sleeping better, she feels rested the next day. She has history of OSA on CPAP.  History of chronic pain, osteoarthritis involving mainly her knees. She also has intermittently back pain and shoulder pain. She denies any major limitation of daily activities due to pain. She takes tramadol 50 mg twice daily, which she has tolerated well and seems to help with pain.OA improved some after weight loss, s/p bariatric surgery  Concerns today: She would like to know if he is safe for her to take her 44 year old granddaughter to the [place]. According to patient, she was doing so before she had a fainting spell about 2-3 months ago. She is reporting complete resolution of dizziness.     Review of Systems  Constitutional: Negative for appetite change, fatigue, fever and unexpected weight change.  HENT: Negative for mouth sores, nosebleeds and trouble swallowing.   Eyes: Negative for pain, redness and visual disturbance.  Respiratory: Negative for cough, shortness of breath and wheezing.   Cardiovascular: Negative for chest pain, palpitations and leg swelling.  Gastrointestinal: Negative for abdominal pain, nausea and vomiting.       Negative for changes in bowel habits.  Genitourinary: Negative for decreased urine volume, difficulty urinating, dysuria and hematuria.  Musculoskeletal: Positive for  arthralgias, back pain and gait problem.  Neurological: Negative for seizures, syncope, weakness, numbness and headaches.  Psychiatric/Behavioral: Negative for confusion, sleep disturbance and suicidal ideas. The patient is nervous/anxious.       Current Outpatient Prescriptions on File Prior to Visit  Medication Sig Dispense Refill  . albuterol (PROVENTIL HFA;VENTOLIN HFA) 108 (90 BASE) MCG/ACT inhaler Inhale 2 puffs into the lungs every 6 (six) hours as  needed for wheezing or shortness of breath. 2 Inhaler 3  . amLODipine (NORVASC) 2.5 MG tablet Take 2.5 mg by mouth daily.     Marland Kitchen anastrozole (ARIMIDEX) 1 MG tablet Take 1 tablet (1 mg total) by mouth daily. 90 tablet 3  . aspirin 81 MG tablet Take 81 mg by mouth daily.      . Calcium Carbonate-Vitamin D (CALCIUM-VITAMIN D) 500-200 MG-UNIT tablet Take 1 tablet by mouth daily.    . carvedilol (COREG) 12.5 MG tablet Take 1 tablet (12.5 mg total) by mouth 2 (two) times daily with a meal. 60 tablet 3  . cloNIDine (CATAPRES - DOSED IN MG/24 HR) 0.1 mg/24hr patch Place 1 patch (0.1 mg total) onto the skin once a week. APPLY ONE PATCH TOPICALLY ONCE A WEEK USE FOR HEADACHES 4 patch 0  . Cyanocobalamin (B-12 PO) Take by mouth. 1/2 teaspoon    . diphenhydramine-acetaminophen (TYLENOL PM) 25-500 MG TABS Take 2 tablets by mouth at bedtime as needed (sleep).    . fluticasone (CUTIVATE) 0.05 % cream Apply 1 application topically as needed (rash). 30 g 1  . furosemide (LASIX) 40 MG tablet Take 0.5 tablets (20 mg total) by mouth daily as needed for edema. 90 tablet 0  . ketoconazole (NIZORAL) 2 % cream Apply 1 application topically daily as needed (rash). 15 g 4  . linaclotide (LINZESS) 290 MCG CAPS capsule Take 1 capsule (290 mcg total) by mouth daily before breakfast. 90 capsule 3  . LORazepam (ATIVAN) 1 MG tablet Take 1/2-1 tablet by mouth twice daily as needed. 60 tablet 2  . losartan (COZAAR) 100 MG tablet Take 1 tablet (100 mg total) by mouth daily. 90 tablet 2  . Melatonin ER 5 MG TBCR Take 1 tablet by mouth at bedtime. 90 tablet 3  . OVER THE COUNTER MEDICATION flintstone with iron    . pantoprazole (PROTONIX) 40 MG tablet Take 40 mg by mouth daily.    . Pediatric Multiple Vitamins (MULTIVITAMIN) LIQD Take 5 mLs by mouth daily.    Marland Kitchen venlafaxine XR (EFFEXOR-XR) 150 MG 24 hr capsule Take 1 capsule (150 mg total) by mouth daily with breakfast. 30 capsule 3   No current facility-administered medications on  file prior to visit.      Past Medical History:  Diagnosis Date  . Arthritis    knees   . Asthma    rare;only when around alot of dust-Ventolin inhaler as needed  . Breast cancer ([place])     left. States she did not have lymph nodes removed  . Bursitis of right shoulder   . Cellulitis and abscess of leg   . Complication of anesthesia    was told 01/06/14 that airway was small  . Depression    takes Effexor and Wellbutrin daily  . Hearing loss   . History of bronchitis 1966  . Hyperlipidemia   . Hypertension    takes Coreg and Losartan daily  . Joint pain   . Joint swelling   . Obese   . Peripheral edema    takes Furosemide daily  as needed and Potassium daily  . Radiation 03/08/14-04/26/14   50.4 gray to left breast. Lumpectomy cavity boosted to 64.4 gray  . Restless legs syndrome    takes depakote  . Sleep apnea, obstructive    uses CPAP  . Wears glasses    No Known Allergies  Social History   Social History  . Marital status: Divorced    Spouse name: N/A  . Number of children: 3  . 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 Smoking status: Never Smoker  . Smokeless tobacco: Never Used  . Alcohol use No  . Drug use: No  . Sexual activity: No   Other Topics Concern  . None   Social History Narrative  . None    Vitals:   09/09/16 1022  BP: 140/90  Pulse: 80  Resp: 12   O2 sat at RA 98%.  Body mass index is 40.21 kg/m.    Physical Exam  Nursing note and vitals reviewed. Constitutional: She is oriented to person, place, and time. She appears well-developed. No distress.  HENT:  Head: Atraumatic.  Mouth/Throat: Oropharynx is clear and moist and mucous membranes are normal.  Eyes: Conjunctivae and EOM are normal. Pupils are equal, round, and reactive to light.  Neck: No JVD present. No tracheal deviation present. No thyroid mass and no thyromegaly present.    Cardiovascular: Normal rate and regular rhythm.   No murmur heard. Pulses:      Dorsalis pedis pulses are 2+ on the right side, and 2+ on the left side.  Varicose veins bilateral, LE  Respiratory: Effort normal and breath sounds normal. No respiratory distress.  GI: Soft. She exhibits no mass. There is no hepatomegaly. There is no tenderness.  Musculoskeletal: She exhibits edema (Trace pitting edema LE bilateral.).  No tenderness upon palpation of paraspinal muscles: Cervical, thoracic, lumbar bilateral. Knee crepitus bilateral, mild limitation of flexion. No effusion appreciated or erythema.   Neurological: She is alert and oriented to person, place, and time. She has normal strength. Coordination normal.  Slow gait, otherwise stable, no assistance today.  Skin: Skin is warm. No erythema.  Psychiatric: She exhibits a depressed mood (mild). She expresses no suicidal ideation. She expresses no suicidal plans.  Well groomed, good eye contact.      ASSESSMENT AND PLAN:     Tamara [name] was seen today for follow-up.  Diagnoses and all orders for this visit:    Generalized osteoarthritis of multiple sites  For a few months we have discussed some side effects of Tramadol and I have been concerned about the combination of this medication with her other medications she takes for depression and anxiety. Today I recommend changing tramadol for Hydrocodone, she understands side effects, she has taken it before and has been well tolerated. She was instructed to start with half tablet at the time and she can take it twice daily. Fall precautions discussed. Follow-up in 6 weeks.  -     HYDROcodone-acetaminophen (NORCO/VICODIN) 5-325 MG tablet; Take 1 tablet by mouth every 12 (twelve) hours as needed for moderate pain.  Insomnia, unspecified type  Well controlled. Good sleep hygiene. Continue gabapentin 600 mg at bedtime. Follow-up in 6-12 months.  -     gabapentin (NEURONTIN) 300 MG  capsule; Take 2 capsules (600 mg total) by mouth at bedtime.  Essential hypertension  Adequately controlled, BP readings at home most in normal  range. No changes in current management. DASH-low salt diet recommended. Eye exam recommended annually.   Anxiety and depression  Stable overall, still reporting some mild depression symptoms. Today we agreed on increasing her Wellbutrin from 75 mg twice daily to 100 mg twice daily. No changes in Effexor. No changes on Ativan. I strongly recommended getting involved with church or with volunteering a few times per week. Follow-up in 6 weeks, before if needed.  -     buPROPion (WELLBUTRIN SR) 100 MG 12 hr tablet; Take 1 tablet (100 mg total) by mouth 2 (two) times daily.  RLS (restless legs syndrome)  She agrees with decreasing Depakote to half tablet daily, we will try to wean off as tolerated. She will continue Gabapentin 600 mg at bedtime. Follow-up in 6 weeks.   -     divalproex (DEPAKOTE ER) 250 MG 24 hr tablet; 1/2 tab daily. Do not crush tablet -     gabapentin (NEURONTIN) 300 MG capsule; Take 2 capsules (600 mg total) by mouth at bedtime.  Need for immunization against influenza -     Flu vaccine HIGH DOSE PF  Need for Tdap vaccination -     Tdap vaccine greater than or equal to 7yo IM         -Ms. Tamara [name] was advised to return sooner than planned today if new concerns arise.       Jezreel Sisk G. Martinique, MD  [facility]. [place] office.

## 2016-09-09 NOTE — Patient Instructions (Addendum)
A few things to remember from today's visit:   Need for immunization against influenza - Plan: Flu vaccine HIGH DOSE PF  Need for Tdap vaccination - Plan: Tdap vaccine greater than or equal to 66yo IM  Essential hypertension  Generalized osteoarthritis of multiple sites  Anxiety and depression  Insomnia, unspecified type  Changes today: Gabapentin continue600 Mg at bedtime (2 caps 300 mg) Wellbutrin  Increase 100 Mg Twice Daily. Depakote Decreased to Half Tablet Daily, planning on weaning off. Tramadol stopped. Hydrocodone 1/2-1 tab 2 times daily.   Please be sure medication list is accurate. If a new problem present, please set up appointment sooner than planned today.

## 2016-09-09 NOTE — Progress Notes (Signed)
Pre visit review using our clinic review tool, if applicable. No additional management support is needed unless otherwise documented below in the visit note. 

## 2016-09-10 DIAGNOSIS — Z9884 Bariatric surgery status: Secondary | ICD-10-CM | POA: Diagnosis not present

## 2016-09-16 ENCOUNTER — Other Ambulatory Visit: Payer: Self-pay | Admitting: Oncology

## 2016-09-16 ENCOUNTER — Other Ambulatory Visit: Payer: Self-pay | Admitting: Family Medicine

## 2016-09-16 DIAGNOSIS — G2581 Restless legs syndrome: Secondary | ICD-10-CM

## 2016-09-16 MED ORDER — CLONIDINE HCL 0.1 MG/24HR TD PTWK: 0.1000 mg | MEDICATED_PATCH | TRANSDERMAL | 0 refills | Status: DC

## 2016-09-16 NOTE — Telephone Encounter (Signed)
I read through your office note & you are wanting to wean her off of the Depakote. How do you want it sent in?

## 2016-09-18 ENCOUNTER — Other Ambulatory Visit: Payer: Self-pay

## 2016-09-18 DIAGNOSIS — G2581 Restless legs syndrome: Secondary | ICD-10-CM

## 2016-09-18 MED ORDER — DIVALPROEX SODIUM ER 250 MG PO TB24: ORAL_TABLET | ORAL | 1 refills | Status: DC

## 2016-09-22 MED ORDER — CLONIDINE HCL 0.1 MG/24HR TD PTWK: 0.1000 mg | MEDICATED_PATCH | TRANSDERMAL | 0 refills | Status: DC

## 2016-09-22 NOTE — Addendum Note (Signed)
Addended by: Prentiss Bells on: 09/22/2016 12:44 PM   Modules accepted: Orders

## 2016-10-21 ENCOUNTER — Encounter: Payer: Self-pay | Admitting: Family Medicine

## 2016-10-21 ENCOUNTER — Ambulatory Visit (INDEPENDENT_AMBULATORY_CARE_PROVIDER_SITE_OTHER): Payer: Medicare Other | Admitting: Family Medicine

## 2016-10-21 VITALS — BP 140/80 | HR 82 | Resp 12 | Ht 66.0 in | Wt 246.4 lb

## 2016-10-21 DIAGNOSIS — F329 Major depressive disorder, single episode, unspecified: Secondary | ICD-10-CM

## 2016-10-21 DIAGNOSIS — G2581 Restless legs syndrome: Secondary | ICD-10-CM | POA: Diagnosis not present

## 2016-10-21 DIAGNOSIS — F419 Anxiety disorder, unspecified: Secondary | ICD-10-CM

## 2016-10-21 DIAGNOSIS — F418 Other specified anxiety disorders: Secondary | ICD-10-CM | POA: Diagnosis not present

## 2016-10-21 DIAGNOSIS — M159 Polyosteoarthritis, unspecified: Secondary | ICD-10-CM | POA: Diagnosis not present

## 2016-10-21 DIAGNOSIS — I10 Essential (primary) hypertension: Secondary | ICD-10-CM | POA: Diagnosis not present

## 2016-10-21 LAB — BASIC METABOLIC PANEL
BUN: 20 mg/dL (ref 6–23)
CALCIUM: 9.9 mg/dL (ref 8.4–10.5)
CO2: 29 mEq/L (ref 19–32)
Chloride: 103 mEq/L (ref 96–112)
Creatinine, Ser: 0.79 mg/dL (ref 0.40–1.20)
GFR: 77.25 mL/min (ref >60.00)
GLUCOSE: 102 mg/dL — AB (ref 70–99)
Potassium: 4.8 mEq/L (ref 3.5–5.1)
Sodium: 139 mEq/L (ref 135–145)

## 2016-10-21 NOTE — Progress Notes (Signed)
Pre visit review using our clinic review tool, if applicable. No additional management support is needed unless otherwise documented below in the visit note. 

## 2016-10-21 NOTE — Progress Notes (Signed)
HPI:   [name] is a 65 y.o. female, who is here today to follow on some of her chronic medical problems we addressed last OV,09/09/16.  She has Hx of RLS, currently she is on Depakote 250 mg, which was decreased last OV and planning on continue weaning off as tolerated. She has not noted worsening symptoms,states that RLS "is OK" She is also on Gabapentin, which she is not sure about initial indication when added. It was increassed to 600 mg at bedtime and helping with insomnia as well as RLS.  -She also has Hx of depression and anxiety, she is on Effexor XL 150 mg daily, Wellbutrin added in 07/2016 and increased last OV from SR 75 mg bid to 100 mg bid. She also takes Ativan 1 mg 1/2-1 tab bid as needed.   Reporting depression and anxiety as being "Ok", stable otherwise. She is going to the park with granddaughter, which she enjoys. She denies suicidal thoughts.  Chronic pain: OA (knee, shoulders, hips) and back pain, last OV Tramadol changed for Hydrocodone-Acetaminophen 5-325 mg, which she takes mainly at bedtime and occasionally during the day. Medication helps with pain but she feels "funny", drowsy, denies other side effect. Tramadol was discontinued because risk of interaction with Effexor.  HTN: She brings BP and HR readings today, most of BP's < 140/90, HR hight 50's and 60's. About 2-3 months ago Amlodipine 2.5 mg was added.   She is currently on Coreg 12.5 mg bid, which was decreased a few months ago due to bradycardia + she was having dizzy spells. She is on Catapres patch, mainly for hot flashes. Cozaar 100 mg daily.  Denies severe/frequent headache, visual changes, chest pain, dyspnea, palpitation, claudication, focal weakness, or edema. Tolerating meds well, denies side effects.   She has no concerns today    Review of Systems  Constitutional: Negative for appetite change, fatigue, fever and unexpected weight change.  HENT: Negative for mouth  sores, nosebleeds and trouble swallowing.   Eyes: Negative for pain, redness and visual disturbance.  Respiratory: Negative for cough, shortness of breath and wheezing.   Cardiovascular: Negative for chest pain, palpitations and leg swelling.  Gastrointestinal: Negative for abdominal pain, nausea and vomiting.       Negative for changes in bowel habits.  Genitourinary: Negative for decreased urine volume, difficulty urinating and hematuria.  Musculoskeletal: Positive for arthralgias and back pain.  Neurological: Negative for syncope, weakness and headaches.  Psychiatric/Behavioral: Negative for confusion, sleep disturbance and suicidal ideas. The patient is nervous/anxious.       Current Outpatient Prescriptions on File Prior to Visit  Medication Sig Dispense Refill  . albuterol (PROVENTIL HFA;VENTOLIN HFA) 108 (90 BASE) MCG/ACT inhaler Inhale 2 puffs into the lungs every 6 (six) hours as needed for wheezing or shortness of breath. 2 Inhaler 3  . amLODipine (NORVASC) 2.5 MG tablet Take 2.5 mg by mouth daily.     Marland Kitchen anastrozole (ARIMIDEX) 1 MG tablet Take 1 tablet (1 mg total) by mouth daily. 90 tablet 3  . aspirin 81 MG tablet Take 81 mg by mouth daily.      Marland Kitchen buPROPion (WELLBUTRIN SR) 100 MG 12 hr tablet Take 1 tablet (100 mg total) by mouth 2 (two) times daily. 60 tablet 1  . Calcium Carbonate-Vitamin D (CALCIUM-VITAMIN D) 500-200 MG-UNIT tablet Take 1 tablet by mouth daily.    . carvedilol (COREG) 12.5 MG tablet Take 1 tablet (12.5 mg total) by mouth 2 (two)  times daily with a meal. 60 tablet 3  . cloNIDine (CATAPRES - DOSED IN MG/24 HR) 0.1 mg/24hr patch Place 1 patch (0.1 mg total) onto the skin once a week. APPLY ONE PATCH TOPICALLY ONCE A WEEK USE FOR HEADACHES 4 patch 0  . Cyanocobalamin (B-12 PO) Take by mouth. 1/2 teaspoon    . diphenhydramine-acetaminophen (TYLENOL PM) 25-500 MG TABS Take 2 tablets by mouth at bedtime as needed (sleep).    . divalproex (DEPAKOTE ER) 250 MG 24 hr  tablet 1 tab q 2 days. We are trying to wean it off. 30 tablet 1  . fluticasone (CUTIVATE) 0.05 % cream Apply 1 application topically as needed (rash). 30 g 1  . furosemide (LASIX) 40 MG tablet Take 0.5 tablets (20 mg total) by mouth daily as needed for edema. 90 tablet 0  . gabapentin (NEURONTIN) 300 MG capsule Take 2 capsules (600 mg total) by mouth at bedtime. 90 capsule 3  . HYDROcodone-acetaminophen (NORCO/VICODIN) 5-325 MG tablet Take 1 tablet by mouth every 12 (twelve) hours as needed for moderate pain. 20 tablet 0  . ketoconazole (NIZORAL) 2 % cream Apply 1 application topically daily as needed (rash). 15 g 4  . linaclotide (LINZESS) 290 MCG CAPS capsule Take 1 capsule (290 mcg total) by mouth daily before breakfast. 90 capsule 3  . LORazepam (ATIVAN) 1 MG tablet Take 1/2-1 tablet by mouth twice daily as needed. 60 tablet 2  . losartan (COZAAR) 100 MG tablet Take 1 tablet (100 mg total) by mouth daily. 90 tablet 2  . Melatonin ER 5 MG TBCR Take 1 tablet by mouth at bedtime. 90 tablet 3  . OVER THE COUNTER MEDICATION flintstone with iron    . pantoprazole (PROTONIX) 40 MG tablet Take 40 mg by mouth daily.    . Pediatric Multiple Vitamins (MULTIVITAMIN) LIQD Take 5 mLs by mouth daily.    Marland Kitchen venlafaxine XR (EFFEXOR-XR) 150 MG 24 hr capsule Take 1 capsule (150 mg total) by mouth daily with breakfast. 30 capsule 3   No current facility-administered medications on file prior to visit.      Past Medical History:  Diagnosis Date  . Arthritis    knees   . Asthma    rare;only when around alot of dust-Ventolin inhaler as needed  . Breast cancer ([place])     left. States she did not have lymph nodes removed  . Bursitis of right shoulder   . Cellulitis and abscess of leg   . Complication of anesthesia    was told 01/06/14 that airway was small  . Depression    takes Effexor and Wellbutrin daily  . Hearing loss   . History of bronchitis 1966  . Hyperlipidemia   . Hypertension    takes Coreg  and Losartan daily  . Joint pain   . Joint swelling   . Obese   . Peripheral edema    takes Furosemide daily as needed and Potassium daily  . Radiation 03/08/14-04/26/14   50.4 gray to left breast. Lumpectomy cavity boosted to 64.4 gray  . Restless legs syndrome    takes depakote  . Sleep apnea, obstructive    uses CPAP  . Wears glasses    No Known Allergies  Social History   Social History  . Marital status: Divorced    Spouse name: N/A  . Number of children: 3  . 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 Smoking status: Never Smoker  . Smokeless tobacco: Never Used  . Alcohol use No  . Drug use: No  . Sexual activity: No   Other Topics Concern  . None   Social History Narrative  . None    Vitals:   10/21/16 0954  BP: 140/80  Pulse: 82  Resp: 12   Body mass index is 39.77 kg/m.    Physical Exam  Nursing note and vitals reviewed. Constitutional: She is oriented to person, place, and time. She appears well-developed. No distress.  HENT:  Head: Atraumatic.  Mouth/Throat: Oropharynx is clear and moist and mucous membranes are normal.  Eyes: Conjunctivae and EOM are normal.  Cardiovascular: Normal rate and regular rhythm.   No murmur heard. Pulses:      Dorsalis pedis pulses are 2+ on the right side, and 2+ on the left side.  Respiratory: Effort normal and breath sounds normal. No respiratory distress.  GI: Soft. There is no tenderness.  Musculoskeletal: She exhibits no edema.  No tenderness upon palpation of paraspinal muscles of thoracic or lumbar bilateral.  Neurological: She is alert and oriented to person, place, and time. She has normal strength. Coordination normal.  Slow gait, otherwise stable, no assisted.  Skin: Skin is warm. No erythema.  Psychiatric: She has a normal mood and affect. Her speech is normal. Cognition and memory are normal.  Well groomed, good  eye contact.      ASSESSMENT AND PLAN:     Kalin was seen today for follow-up.  Diagnoses and all orders for this visit:   Lab Results  Component Value Date   CREATININE 0.79 10/21/2016   BUN 20 10/21/2016   NA 139 10/21/2016   K 4.8 10/21/2016   CL 103 10/21/2016   CO2 29 10/21/2016    Generalized osteoarthritis of multiple sites  Pain seems to be well controlled with Hydrocodone-Acetaminophen, we discussed soe side effects. I recommend trying 1/2 tab at the time and bid as needed. Fall precautions. F/U in 3-4 months.  RLS (restless legs syndrome)  Well controlled, we will continue weaning Depakote off, which according to pt, she was taken for RLS. She will continue Gabapentin 600 mg at bed time.  Essential hypertension  Adequately controlled. No changes in current management. Low salt diet. Periodic eye examination. F/u in 3-4 months.  -     Basic metabolic panel  Anxiety and depression  In general I think her mood has improved based on observation during OV. For now no changes in current management. Next OV I am considering changing Wellbutrin to XL. Instructed about warning signs.         -Ms. JENNIA KIEL was advised to return sooner than planned today if new concerns arise.       Karis Rilling G. Martinique, MD  [facility]. [place] office.

## 2016-10-21 NOTE — Patient Instructions (Addendum)
A few things to remember from today's visit:   Essential hypertension - Plan: Basic metabolic panel  Generalized osteoarthritis of multiple sites  RLS (restless legs syndrome) Hydrocodone 1/2 tab am and pm. Fall precautions. Depakote continue weaning off.    Please be sure medication list is accurate. If a new problem present, please set up appointment sooner than planned today.

## 2016-11-18 ENCOUNTER — Other Ambulatory Visit: Payer: Self-pay | Admitting: Family Medicine

## 2016-11-18 ENCOUNTER — Other Ambulatory Visit: Payer: Self-pay | Admitting: Oncology

## 2016-11-18 DIAGNOSIS — M159 Polyosteoarthritis, unspecified: Secondary | ICD-10-CM

## 2016-11-18 DIAGNOSIS — I10 Essential (primary) hypertension: Secondary | ICD-10-CM

## 2016-11-18 DIAGNOSIS — G4730 Sleep apnea, unspecified: Secondary | ICD-10-CM

## 2016-11-18 NOTE — Telephone Encounter (Signed)
Okay to refill? 

## 2016-11-19 ENCOUNTER — Telehealth: Payer: Self-pay | Admitting: Family Medicine

## 2016-11-19 MED ORDER — HYDROCODONE-ACETAMINOPHEN 5-325 MG PO TABS: 1.0000 | ORAL_TABLET | Freq: Two times a day (BID) | ORAL | 0 refills | Status: DC | PRN

## 2016-11-19 MED ORDER — LORAZEPAM 1 MG PO TABS: ORAL_TABLET | ORAL | 2 refills | Status: DC

## 2016-11-19 NOTE — Telephone Encounter (Signed)
Both Rx's printed. Thanks, BJ

## 2016-11-19 NOTE — Telephone Encounter (Signed)
Lorazepam called in & mychart message sent to patient letting her know hydrocodone is ready for pick up.

## 2016-11-19 NOTE — Addendum Note (Signed)
Addended by: Kateri Mc E on: 11/19/2016 12:31 PM   Modules accepted: Orders

## 2016-11-19 NOTE — Telephone Encounter (Signed)
Pt request refill  HYDROcodone-acetaminophen (NORCO/VICODIN) 5-325 MG tablet  Pt would like to know if she could increase the LORazepam (ATIVAN) 1 MG tablet OR Be prescribed another med to help with her anxiety.

## 2016-11-19 NOTE — Telephone Encounter (Signed)
See below

## 2016-11-19 NOTE — Telephone Encounter (Signed)
Rx called in 

## 2016-12-01 HISTORY — PX: COLONOSCOPY: SHX174

## 2016-12-08 ENCOUNTER — Other Ambulatory Visit: Payer: Self-pay | Admitting: Emergency Medicine

## 2016-12-08 ENCOUNTER — Telehealth: Payer: Self-pay | Admitting: Emergency Medicine

## 2016-12-08 MED ORDER — LORAZEPAM 1 MG PO TABS: 1.0000 mg | ORAL_TABLET | Freq: Two times a day (BID) | ORAL | 5 refills | Status: DC | PRN

## 2016-12-08 NOTE — Telephone Encounter (Signed)
Prescription called in

## 2016-12-08 NOTE — Telephone Encounter (Signed)
Pt would like rx called into the pharmacy

## 2016-12-09 ENCOUNTER — Other Ambulatory Visit: Payer: Self-pay | Admitting: Family Medicine

## 2016-12-09 MED ORDER — LOSARTAN POTASSIUM 100 MG PO TABS: 100.0000 mg | ORAL_TABLET | Freq: Every day | ORAL | 2 refills | Status: DC

## 2016-12-12 ENCOUNTER — Encounter: Payer: Self-pay | Admitting: Gastroenterology

## 2016-12-26 DIAGNOSIS — D0512 Intraductal carcinoma in situ of left breast: Secondary | ICD-10-CM | POA: Diagnosis not present

## 2016-12-26 DIAGNOSIS — Z9884 Bariatric surgery status: Secondary | ICD-10-CM | POA: Diagnosis not present

## 2017-01-02 ENCOUNTER — Ambulatory Visit: Payer: Self-pay | Admitting: Dietician

## 2017-01-02 ENCOUNTER — Telehealth: Payer: Self-pay | Admitting: Family Medicine

## 2017-01-02 DIAGNOSIS — M159 Polyosteoarthritis, unspecified: Secondary | ICD-10-CM

## 2017-01-02 MED ORDER — HYDROCODONE-ACETAMINOPHEN 5-325 MG PO TABS: 1.0000 | ORAL_TABLET | Freq: Two times a day (BID) | ORAL | 0 refills | Status: DC | PRN

## 2017-01-02 NOTE — Telephone Encounter (Signed)
Called and spoke with patient, let her know that Rx is up front and ready for pick up. Patient verbalized understanding.

## 2017-01-02 NOTE — Telephone Encounter (Signed)
Rx for Hydrocodone-Acetaminophen 5-325 mg can be continue 1/2-1 tab bid as needed for pain. #40/0. Please remind her to call in advance (2-3 days) for refills. Thanks, BJ

## 2017-01-02 NOTE — Telephone Encounter (Signed)
Pt request refill  HYDROcodone-acetaminophen (NORCO/VICODIN) 5-325 MG tablet  Pt will be out this way this am and hoping to pick up if possible

## 2017-01-09 ENCOUNTER — Other Ambulatory Visit: Payer: Self-pay | Admitting: *Deleted

## 2017-01-09 MED ORDER — VENLAFAXINE HCL ER 150 MG PO CP24: 150.0000 mg | ORAL_CAPSULE | Freq: Every day | ORAL | 3 refills | Status: DC

## 2017-01-12 ENCOUNTER — Encounter: Payer: Self-pay | Admitting: Oncology

## 2017-01-13 ENCOUNTER — Other Ambulatory Visit: Payer: Self-pay

## 2017-01-13 MED ORDER — VENLAFAXINE HCL ER 150 MG PO CP24: 150.0000 mg | ORAL_CAPSULE | Freq: Every day | ORAL | 3 refills | Status: DC

## 2017-01-16 ENCOUNTER — Telehealth: Payer: Self-pay | Admitting: *Deleted

## 2017-01-16 NOTE — Telephone Encounter (Signed)
Dr. Silverio Decamp,  This pt doesn't qualify for [facility] because of hx of difficult intubation.  Is she ok for a direct to [facility] or would you like an OV first?  Thanks, Cyril Mourning

## 2017-01-16 NOTE — Telephone Encounter (Signed)
Please schedule for office visit , ok to schedule with extender if I have no opening. Thanks

## 2017-01-16 NOTE — Telephone Encounter (Signed)
John,  Could you please review this pt's chart?  Per OV note, pt states she "was told airway was small."  Would you please review her surgery notes?  I saw with her last surgery "difficulty was anticipated."  Just wanted to make sure she is ok for [place].  Thanks, [employer]

## 2017-01-16 NOTE — Telephone Encounter (Signed)
Cyril Mourning,  This pt is a difficult intubation so does not qualify for anesthetic care at [facility].  Thanks,  Osvaldo Angst

## 2017-01-16 NOTE — Telephone Encounter (Signed)
Attempted to reach pt to set up OV.  No answer and no ID on voicemail; not able to leave message

## 2017-01-21 NOTE — Telephone Encounter (Signed)
Tried pt via telephone again.  No id on answering machine.  No message left. Angela/PV

## 2017-01-23 ENCOUNTER — Ambulatory Visit (INDEPENDENT_AMBULATORY_CARE_PROVIDER_SITE_OTHER): Payer: Medicare Other | Admitting: Physician Assistant

## 2017-01-23 ENCOUNTER — Encounter: Payer: Self-pay | Admitting: Physician Assistant

## 2017-01-23 VITALS — BP 138/80 | HR 70 | Ht 65.0 in | Wt 242.0 lb

## 2017-01-23 DIAGNOSIS — Z01818 Encounter for other preprocedural examination: Secondary | ICD-10-CM

## 2017-01-23 DIAGNOSIS — Z1211 Encounter for screening for malignant neoplasm of colon: Secondary | ICD-10-CM

## 2017-01-23 DIAGNOSIS — T884XXS Failed or difficult intubation, sequela: Secondary | ICD-10-CM | POA: Diagnosis not present

## 2017-01-23 MED ORDER — NA SULFATE-K SULFATE-MG SULF 17.5-3.13-1.6 GM/177ML PO SOLN: 1.0000 | Freq: Once | ORAL | 0 refills | Status: AC

## 2017-01-23 NOTE — Patient Instructions (Signed)
You have been scheduled for a colonoscopy. Please follow written instructions given to you at your visit today.  Please pick up your prep supplies at the pharmacy within the next 1-3 days. If you use inhalers (even only as needed), please bring them with you on the day of your procedure.   

## 2017-01-23 NOTE — Progress Notes (Signed)
Chief Complaint: Screening colonoscopy  HPI:  Tamara [name] is a 66 year old Caucasian female with a past medical history of arthritis, breast cancer, complications of anesthesia, depression, hyperlipidemia, hypertension, sleep apnea and others listed below, who presents to clinic today for consultation of a screening colonoscopy.   Patient's last noted colonoscopy was done by Dr. Velora Heckler on 09/17/06 with a finding of diverticula and internal hemorrhoids. This was otherwise normal and patient was told to repeat in 10 years.   Today, the patient tells me that she was scheduled for a colonoscopy on March 9 with Dr. Silverio Decamp but was told that this would need to be done in the hospital. The patient is fairly unaware of why she is in the office or why this may need to be done in the hospital. Patient does tell me she has sleep apnea and wears a CPAP at night but denies oxygen use. She seems somewhat unaware of problems with anesthesia in the past.   Patient denies fever, chills, blood in her stool, melena, change in bowel habits, weight loss, fatigue, Nexium, nausea, vomiting or abdominal pain.  Past Medical History:  Diagnosis Date  . Arthritis    knees   . Asthma    rare;only when around alot of dust-Ventolin inhaler as needed  . Breast cancer ([place])     left. States she did not have lymph nodes removed  . Bursitis of right shoulder   . Cellulitis and abscess of leg   . Complication of anesthesia    was told 01/06/14 that airway was small  . Depression    takes Effexor and Wellbutrin daily  . Hearing loss   . History of bronchitis 1966  . Hyperlipidemia   . Hypertension    takes Coreg and Losartan daily  . Joint pain   . Joint swelling   . Obese   . Peripheral edema    takes Furosemide daily as needed and Potassium daily  . Radiation 03/08/14-04/26/14   50.4 gray to left breast. Lumpectomy cavity boosted to 64.4 gray  . Restless legs syndrome    takes depakote  . Sleep apnea,  obstructive    uses CPAP  . Wears glasses     Past Surgical History:  Procedure Laterality Date  . BREAST LUMPECTOMY WITH NEEDLE LOCALIZATION Left 01/24/2014   Procedure: BREAST LUMPECTOMY WITH NEEDLE LOCALIZATION;  Surgeon: Edward Jolly, MD;  Location: [facility];  Service: General;  Laterality: Left;  . COLONOSCOPY    . DILATATION & CURETTAGE/HYSTEROSCOPY WITH TRUECLEAR N/A 01/06/2014   Procedure: DILATATION & CURETTAGE/HYSTEROSCOPY WITH TRUCLEAR;  Surgeon: Shon Millet II, MD;  Location: [facility];  Service: Gynecology;  Laterality: N/A;  . HYSTEROPLASTY  01/2014  . INNER EAR SURGERY Bilateral    for hearing loss  . JOINT REPLACEMENT Right    Knee  . LAPAROSCOPIC GASTRIC SLEEVE RESECTION N/A 01/28/2016   Procedure: LAPAROSCOPIC GASTRIC SLEEVE RESECTION WITH UPPER ENDO;  Surgeon: Excell Seltzer, MD;  Location: [facility];  Service: General;  Laterality: N/A;  . RE-EXCISION OF BREAST LUMPECTOMY Left 02/02/2014   Procedure: RE-EXCISION OF LEFT BREAST LUMPECTOMY;  Surgeon: Edward Jolly, MD;  Location: [facility];  Service: General;  Laterality: Left;  . TUBAL LIGATION      Current Outpatient Prescriptions  Medication Sig Dispense Refill  . amLODipine (NORVASC) 2.5 MG tablet Take 2.5 mg by mouth daily.     Marland Kitchen anastrozole (ARIMIDEX) 1 MG tablet Take 1 tablet (1 mg total) by mouth daily. [place]  tablet 3  . aspirin 81 MG tablet Take 81 mg by mouth daily.      Marland Kitchen buPROPion (WELLBUTRIN SR) 100 MG 12 hr tablet Take 1 tablet (100 mg total) by mouth 2 (two) times daily. 60 tablet 1  . Calcium Carbonate-Vitamin D (CALCIUM-VITAMIN D) 500-200 MG-UNIT tablet Take 1 tablet by mouth daily.    . carvedilol (COREG) 12.5 MG tablet Take 1 tablet (12.5 mg total) by mouth 2 (two) times daily with a meal. 60 tablet 3  . cloNIDine (CATAPRES - DOSED IN MG/24 HR) 0.1 mg/24hr patch Place 1 patch (0.1 mg total) onto the skin once a week. APPLY ONE PATCH TOPICALLY ONCE A WEEK USE FOR HEADACHES 4 patch 0  . Cyanocobalamin  (B-12 PO) Take by mouth. 1/2 teaspoon    . diphenhydramine-acetaminophen (TYLENOL PM) 25-500 MG TABS Take 2 tablets by mouth at bedtime as needed (sleep).    . divalproex (DEPAKOTE ER) 250 MG 24 hr tablet 1 tab q 2 days. We are trying to wean it off. 30 tablet 1  . fluticasone (CUTIVATE) 0.05 % cream Apply 1 application topically as needed (rash). 30 g 1  . furosemide (LASIX) 40 MG tablet Take 0.5 tablets (20 mg total) by mouth daily as needed for edema. 90 tablet 0  . gabapentin (NEURONTIN) 300 MG capsule Take 2 capsules (600 mg total) by mouth at bedtime. 90 capsule 3  . HYDROcodone-acetaminophen (NORCO/VICODIN) 5-325 MG tablet Take 1 tablet by mouth every 12 (twelve) hours as needed for moderate pain. 40 tablet 0  . ketoconazole (NIZORAL) 2 % cream Apply 1 application topically daily as needed (rash). 15 g 4  . linaclotide (LINZESS) 290 MCG CAPS capsule Take 1 capsule (290 mcg total) by mouth daily before breakfast. 90 capsule 3  . LORazepam (ATIVAN) 1 MG tablet TAKE ONE TABLET BY MOUTH TWICE DAILY AS NEEDED 60 tablet 0  . losartan (COZAAR) 100 MG tablet Take 1 tablet (100 mg total) by mouth daily. 90 tablet 2  . Melatonin ER 5 MG TBCR Take 1 tablet by mouth at bedtime. 90 tablet 3  . OVER THE COUNTER MEDICATION flintstone with iron    . pantoprazole (PROTONIX) 40 MG tablet Take 40 mg by mouth daily.    . Pediatric Multiple Vitamins (MULTIVITAMIN) LIQD Take 5 mLs by mouth daily.    Marland Kitchen venlafaxine XR (EFFEXOR-XR) 150 MG 24 hr capsule Take 1 capsule (150 mg total) by mouth daily with breakfast. 30 capsule 3  . VENTOLIN HFA 108 (90 Base) MCG/ACT inhaler INHALE TWO PUFFS BY MOUTH EVERY 6 HOURS AS NEEDED FOR WHEEZING OR SHORTNESS OF BREATH 36 each 3   No current facility-administered medications for this visit.     Allergies as of 01/23/2017  . (No Known Allergies)    Family History  Problem Relation Age of Onset  . Dementia Mother   . Heart attack Mother   . Esophageal cancer Father      also had stomach cancer  . Heart attack Father   . Asthma Other   . Hypertension Other   . Thyroid disease Other   . Heart attack Other   . Throat cancer Paternal Grandfather     Social History   Social History  . Marital status: Divorced    Spouse name: N/A  . Number of children: 3  . 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 Smoking  status: Never Smoker  . Smokeless tobacco: Never Used  . Alcohol use No  . Drug use: No  . Sexual activity: No   Other Topics Concern  . Not on file   Social History Narrative  . No narrative on file    Review of Systems:    Constitutional: No weight loss, fever or chills Skin: No rash Cardiovascular: No chest pain Respiratory: No SOB  Gastrointestinal: See HPI and otherwise negative Genitourinary: No dysuria or change in urinary frequency Neurological: No headache Musculoskeletal: No new muscle or joint pain Hematologic: No bleeding Psychiatric: Positive for depression   Physical Exam:  Vital signs: BP 138/80   Pulse 70   Ht 5\' 5"  (1.651 m)   Wt 242 lb (109.8 kg)   SpO2 96%   BMI 40.27 kg/m   Constitutional:   Pleasant Obese Caucasian female appears to be in NAD, Well developed, Well nourished, alert and cooperative Respiratory: Respirations even and unlabored. Lungs clear to auscultation bilaterally.   No wheezes, crackles, or rhonchi.  Cardiovascular: Normal S1, S2. No MRG. Regular rate and rhythm. No peripheral edema, cyanosis or pallor.  Gastrointestinal:  Soft, nondistended, nontender. No rebound or guarding. Normal bowel sounds. No appreciable masses or hepatomegaly. Rectal:  Not performed.  Msk:  Symmetrical without gross deformities. Without edema, no deformity or joint abnormality.  Neurologic:  Alert and  oriented x4;  grossly normal neurologically.  Skin:   Dry and intact without significant lesions or rashes. Psychiatric:  Demonstrates good judgement and reason without abnormal affect or behaviors.  No recent labs or imaging.  Assessment: 1. Screening colonoscopy: The patient is due for a screening colonoscopy as her last one was more than 10 years ago, it was noted after time of her gastric sleeve procedure in 2017 that she had a small airway and was difficult to intubate, due to this we are recommending patient's colonoscopy be performed in the hospital for added precaution, this was discussed in full detail with the patient today and she acknowledges  Plan: 1. Canceled patient's procedure scheduled for March 9 with Dr. Silverio Decamp in the [facility] and scheduled the patient at [facility] on April 20. Did discuss risks, benefits, limitations and alternatives and patient agrees to proceed. 2. Patient to follow in clinic per recommendations from Dr. Silverio Decamp after time of procedure.  Tamara Newer, Tamara [name] [facility] 01/23/2017, 10:40 AM  Cc: Martinique, Betty G, MD

## 2017-01-26 NOTE — Progress Notes (Signed)
Reviewed and agree with documentation and assessment and plan. K. Veena Nandigam , MD   

## 2017-02-02 ENCOUNTER — Other Ambulatory Visit: Payer: Self-pay | Admitting: Family Medicine

## 2017-02-02 ENCOUNTER — Other Ambulatory Visit: Payer: Self-pay | Admitting: Oncology

## 2017-02-02 DIAGNOSIS — M159 Polyosteoarthritis, unspecified: Secondary | ICD-10-CM

## 2017-02-05 ENCOUNTER — Telehealth: Payer: Self-pay | Admitting: Family Medicine

## 2017-02-05 DIAGNOSIS — M159 Polyosteoarthritis, unspecified: Secondary | ICD-10-CM

## 2017-02-05 MED ORDER — LOSARTAN POTASSIUM 100 MG PO TABS: 100.0000 mg | ORAL_TABLET | Freq: Every day | ORAL | 1 refills | Status: DC

## 2017-02-05 NOTE — Telephone Encounter (Signed)
° ° °  Pt request refill of the following:  HYDROcodone-acetaminophen (NORCO/VICODIN) 5-325 MG tablet  losartan (COZAAR) 100 MG tablet    Phamacy:

## 2017-02-05 NOTE — Telephone Encounter (Signed)
I sent Rx for Losartan.  Please advise on Hydrocodone.  Thanks!

## 2017-02-06 ENCOUNTER — Other Ambulatory Visit: Payer: Self-pay | Admitting: Family Medicine

## 2017-02-06 ENCOUNTER — Encounter: Payer: Self-pay | Admitting: Gastroenterology

## 2017-02-06 DIAGNOSIS — G2581 Restless legs syndrome: Secondary | ICD-10-CM

## 2017-02-06 MED ORDER — HYDROCODONE-ACETAMINOPHEN 5-325 MG PO TABS: 1.0000 | ORAL_TABLET | Freq: Two times a day (BID) | ORAL | 0 refills | Status: DC | PRN

## 2017-02-06 NOTE — Telephone Encounter (Signed)
Called and talked with patient. Let her know that Rx's are ready for pick up. Rx's placed up front.

## 2017-02-06 NOTE — Telephone Encounter (Signed)
Hydrocodone-Acetaminophen 5-325 mg to continue daily as needed can be printed #40/0. A second Rx post dated ,fill date 03/10/17,same amount.   Thanks

## 2017-02-06 NOTE — Telephone Encounter (Signed)
Pt calling to check the status of Rx.

## 2017-02-06 NOTE — Telephone Encounter (Signed)
She was weaning off this medication, I think she already did.  Thanks, BJ

## 2017-02-06 NOTE — Telephone Encounter (Signed)
Okay to refill Lorazepam.

## 2017-02-06 NOTE — Telephone Encounter (Signed)
Rxs printed.  

## 2017-02-10 NOTE — Telephone Encounter (Signed)
Last Rx for Lorazepam 1 mg was filled 12/31/16 # 60. It seems like she has a refill left on Rx [according to [place] controlled substance report].  Thanks, BJ

## 2017-02-16 DIAGNOSIS — E559 Vitamin D deficiency, unspecified: Secondary | ICD-10-CM | POA: Insufficient documentation

## 2017-02-16 NOTE — Progress Notes (Signed)
HPI:   [name] is a 66 y.o. female, who is here today to follow on some chronic medical problems.   Last follow up on 10/21/17.  RLS: Depakote was weaned off, she is on Gabapentin 600 mg at bedtime. She has not had leg discomfort. Sleeping "good"  Depression and anxiety: Currently she is on Effexor XL 150 mg and Wellbutrin SR 100 mg bid. Sometimes "it can be worse", she sometimes feels depressed, a couple of times per week. Her son does not allow her to keep her granddaughter. Started going to church, going out, and this helps some.  She is also on Ativan 1 mg 1/2-1 tab bid as needed.   Chronic pain: generalized OA,mainly knees,shoulders,hips,and back. Currently she is on Hydrocodone-Acetaminophen 5-325 mg ,which she takes 1/2 tan am and 1 tab, mainly at bedtime. Pain is exacerbated by certain activities as prolonged standing and walking,no major limitations due to pain.Cold weather also exacerbate symptoms," mild"    Constipation: Linzess does not always help. She also takes stool softener as needed.  She is having bowel movements 2 times week.  Denies abdominal pain, nausea, vomiting, blood in stool or melena.   HTN: Cozaar 100 mg,Carvedilol 12.5 mg bid, Catapres 0.1 patch weekly,and Amlodipine 2.5 mg daily. BP readings: "good" Denies severe/frequent headache, visual changes, chest pain, dyspnea, palpitation, claudication, focal weakness, or edema.  She is trying to eat healthier, does not exercise consistently.  Vit D deficiency: She is currently on Vit D3 5000 U daily. Tolerating medication well.    Review of Systems  Constitutional: Positive for fatigue. Negative for appetite change and fever.  HENT: Negative for mouth sores, nosebleeds and trouble swallowing.   Eyes: Negative for pain and visual disturbance.  Respiratory: Negative for cough, shortness of breath and wheezing.   Cardiovascular: Negative for chest pain, palpitations and leg  swelling.  Gastrointestinal: Positive for constipation. Negative for abdominal pain, blood in stool, nausea and vomiting.       Negative for changes in bowel habits.  Endocrine: Negative for cold intolerance and heat intolerance.  Genitourinary: Negative for decreased urine volume and hematuria.  Musculoskeletal: Positive for arthralgias and back pain.  Skin: Negative for rash.  Neurological: Negative for syncope, weakness and headaches.  Psychiatric/Behavioral: Negative for confusion, sleep disturbance and suicidal ideas. The patient is nervous/anxious.       Current Outpatient Prescriptions on File Prior to Visit  Medication Sig Dispense Refill  . amLODipine (NORVASC) 2.5 MG tablet Take 2.5 mg by mouth daily.     Marland Kitchen anastrozole (ARIMIDEX) 1 MG tablet Take 1 tablet (1 mg total) by mouth daily. 90 tablet 3  . aspirin 81 MG tablet Take 81 mg by mouth daily.      . Calcium Carbonate-Vitamin D (CALCIUM-VITAMIN D) 500-200 MG-UNIT tablet Take 1 tablet by mouth daily.    . carvedilol (COREG) 12.5 MG tablet Take 1 tablet (12.5 mg total) by mouth 2 (two) times daily with a meal. 60 tablet 3  . cloNIDine (CATAPRES - DOSED IN MG/24 HR) 0.1 mg/24hr patch Place 1 patch (0.1 mg total) onto the skin once a week. APPLY ONE PATCH TOPICALLY ONCE A WEEK USE FOR HEADACHES 4 patch 0  . Cyanocobalamin (B-12 PO) Take by mouth. 1/2 teaspoon    . diphenhydramine-acetaminophen (TYLENOL PM) 25-500 MG TABS Take 2 tablets by mouth at bedtime as needed (sleep).    . fluticasone (CUTIVATE) 0.05 % cream Apply 1 application topically as needed (rash).  30 g 1  . furosemide (LASIX) 40 MG tablet Take 0.5 tablets (20 mg total) by mouth daily as needed for edema. 90 tablet 0  . gabapentin (NEURONTIN) 300 MG capsule Take 2 capsules (600 mg total) by mouth at bedtime. 90 capsule 3  . HYDROcodone-acetaminophen (NORCO/VICODIN) 5-325 MG tablet Take 1 tablet by mouth every 12 (twelve) hours as needed for moderate pain. 40 tablet 0    . ketoconazole (NIZORAL) 2 % cream Apply 1 application topically daily as needed (rash). 15 g 4  . linaclotide (LINZESS) 290 MCG CAPS capsule Take 1 capsule (290 mcg total) by mouth daily before breakfast. 90 capsule 3  . LORazepam (ATIVAN) 1 MG tablet TAKE ONE TABLET BY MOUTH TWICE DAILY AS NEEDED 60 tablet 0  . losartan (COZAAR) 100 MG tablet Take 1 tablet (100 mg total) by mouth daily. 90 tablet 1  . Melatonin ER 5 MG TBCR Take 1 tablet by mouth at bedtime. 90 tablet 3  . OVER THE COUNTER MEDICATION flintstone with iron    . pantoprazole (PROTONIX) 40 MG tablet Take 40 mg by mouth daily.    . Pediatric Multiple Vitamins (MULTIVITAMIN) LIQD Take 5 mLs by mouth daily.    Marland Kitchen venlafaxine XR (EFFEXOR-XR) 150 MG 24 hr capsule Take 1 capsule (150 mg total) by mouth daily with breakfast. 30 capsule 3  . VENTOLIN HFA 108 (90 Base) MCG/ACT inhaler INHALE TWO PUFFS BY MOUTH EVERY 6 HOURS AS NEEDED FOR WHEEZING OR SHORTNESS OF BREATH 36 each 3   No current facility-administered medications on file prior to visit.      Past Medical History:  Diagnosis Date  . Arthritis    knees   . Asthma    rare;only when around alot of dust-Ventolin inhaler as needed  . Breast cancer ([place])     left. States she did not have lymph nodes removed  . Bursitis of right shoulder   . Cellulitis and abscess of leg   . Complication of anesthesia    was told 01/06/14 that airway was small  . Depression    takes Effexor and Wellbutrin daily  . Hearing loss   . History of bronchitis 1966  . Hyperlipidemia   . Hypertension    takes Coreg and Losartan daily  . Joint pain   . Joint swelling   . Obese   . Peripheral edema    takes Furosemide daily as needed and Potassium daily  . Radiation 03/08/14-04/26/14   50.4 gray to left breast. Lumpectomy cavity boosted to 64.4 gray  . Restless legs syndrome    takes depakote  . Sleep apnea, obstructive    uses CPAP  . Wears glasses    No Known Allergies  Social History    Social History  . Marital status: Divorced    Spouse name: N/A  . Number of children: 3  . 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 Smoking status: Never Smoker  . Smokeless tobacco: Never Used  . Alcohol use No  . Drug use: No  . Sexual activity: No   Other Topics Concern  . None   Social History Narrative  . None    Vitals:   02/17/17 0948  BP: 140/78  Pulse: 70  Resp: 12  O2 sat at RA 96%. Body mass index is 39.63 kg/m.  Wt Readings from Last 3 Encounters:  02/17/17 238 lb 2  oz (108 kg)  01/23/17 242 lb (109.8 kg)  10/21/16 246 lb 6 oz (111.8 kg)    Physical Exam  Nursing note and vitals reviewed. Constitutional: She is oriented to person, place, and time. She appears well-developed. No distress.  HENT:  Head: Atraumatic.  Mouth/Throat: Oropharynx is clear and moist and mucous membranes are normal.  Eyes: Conjunctivae and EOM are normal.  Cardiovascular: Normal rate and regular rhythm.   No murmur heard. Pulses:      Dorsalis pedis pulses are 2+ on the right side, and 2+ on the left side.  Respiratory: Effort normal and breath sounds normal. No respiratory distress.  GI: Soft. There is no hepatomegaly. There is no tenderness.  Musculoskeletal: She exhibits no edema or tenderness.  No tenderness upon palpation of paraspinal muscles of thoracic or lumbar bilateral.  Lymphadenopathy:    She has no cervical adenopathy.  Neurological: She is alert and oriented to person, place, and time. She has normal strength. Coordination and gait normal.  Skin: Skin is warm. No erythema.  Psychiatric: She has a normal mood and affect.  Well groomed, good eye contact.    ASSESSMENT AND PLAN:   Tamara [name] was seen today for follow-up.  Diagnoses and all orders for this visit:  Lab Results  Component Value Date   CREATININE 0.84 02/17/2017   BUN 26 (H) 02/17/2017   NA 140  02/17/2017   K 4.4 02/17/2017   CL 105 02/17/2017   CO2 30 02/17/2017    Constipation, unspecified constipation type  She can continue Linzess for now. Miralax daily as needed. Adequate fiber and fluid intake. Opioid can also be aggravating problem.  Generalized osteoarthritis of multiple sites  Pain adequately controlled. No changes in current management. Fall precautions discussed. F/U in 4 months.  Essential hypertension  SBP slightly elevated,reporting adequate readings at home. No changes in current management. DASH-low diet recommended. Eye exam recommended annually. F/U in 6 months, before if needed.  -     Basic metabolic panel  RLS (restless legs syndrome)  Well controlled,continue gabapentin. F/U in 12 months,before if needed.  Anxiety and depression  Wellbutrin changed to XL 150 mg daily,no changes in Effexor or Lorazepam. Some side effects discussed. F/U in 3-4 months,before if needed.  -     buPROPion (WELLBUTRIN SR) 150 MG 12 hr tablet; Take 1 tablet (150 mg total) by mouth 2 (two) times daily.  Vitamin D deficiency  No changes in current management, will follow labs done today and will give further recommendations accordingly.  -     VITAMIN D 25 Hydroxy (Vit-D Deficiency, Fractures)  BMI 39.0-39.9,adult  Lost about 6 lb since her last OV,10/2016. We discussed benefits of wt loss as well as adverse effects of obesity. Consistency with healthy diet and physical activity recommended. Daily brisk walking,walking in place while watching TV for 15-30 min is a good option. Wellbutrin may also help. F/U in 3-4 months.    -Ms. ALEIDA CRANDELL was advised to return sooner than planned today if new concerns arise.       Lucciano Vitali G. Martinique, MD  [facility]. [place] office.

## 2017-02-17 ENCOUNTER — Encounter: Payer: Self-pay | Admitting: Family Medicine

## 2017-02-17 ENCOUNTER — Ambulatory Visit (INDEPENDENT_AMBULATORY_CARE_PROVIDER_SITE_OTHER): Payer: Medicare Other | Admitting: Family Medicine

## 2017-02-17 VITALS — BP 140/78 | HR 70 | Resp 12 | Ht 65.0 in | Wt 238.1 lb

## 2017-02-17 DIAGNOSIS — K5900 Constipation, unspecified: Secondary | ICD-10-CM | POA: Diagnosis not present

## 2017-02-17 DIAGNOSIS — M159 Polyosteoarthritis, unspecified: Secondary | ICD-10-CM | POA: Diagnosis not present

## 2017-02-17 DIAGNOSIS — F418 Other specified anxiety disorders: Secondary | ICD-10-CM | POA: Diagnosis not present

## 2017-02-17 DIAGNOSIS — E559 Vitamin D deficiency, unspecified: Secondary | ICD-10-CM

## 2017-02-17 DIAGNOSIS — F32A Depression, unspecified: Secondary | ICD-10-CM

## 2017-02-17 DIAGNOSIS — G2581 Restless legs syndrome: Secondary | ICD-10-CM

## 2017-02-17 DIAGNOSIS — I10 Essential (primary) hypertension: Secondary | ICD-10-CM | POA: Diagnosis not present

## 2017-02-17 DIAGNOSIS — Z6839 Body mass index (BMI) 39.0-39.9, adult: Secondary | ICD-10-CM | POA: Diagnosis not present

## 2017-02-17 DIAGNOSIS — F329 Major depressive disorder, single episode, unspecified: Secondary | ICD-10-CM

## 2017-02-17 DIAGNOSIS — F419 Anxiety disorder, unspecified: Secondary | ICD-10-CM

## 2017-02-17 LAB — BASIC METABOLIC PANEL
BUN: 26 mg/dL — ABNORMAL HIGH (ref 6–23)
CALCIUM: 9.9 mg/dL (ref 8.4–10.5)
CO2: 30 mEq/L (ref 19–32)
Chloride: 105 mEq/L (ref 96–112)
Creatinine, Ser: 0.84 mg/dL (ref 0.40–1.20)
GFR: 71.9 mL/min (ref >60.00)
Glucose, Bld: 118 mg/dL — ABNORMAL HIGH (ref 70–99)
Potassium: 4.4 mEq/L (ref 3.5–5.1)
SODIUM: 140 meq/L (ref 135–145)

## 2017-02-17 LAB — VITAMIN D 25 HYDROXY (VIT D DEFICIENCY, FRACTURES): VITD: 46.01 ng/mL (ref 30.00–100.00)

## 2017-02-17 MED ORDER — BUPROPION HCL ER (SR) 150 MG PO TB12: 150.0000 mg | ORAL_TABLET | Freq: Two times a day (BID) | ORAL | 3 refills | Status: DC

## 2017-02-17 NOTE — Progress Notes (Signed)
Pre visit review using our clinic review tool, if applicable. No additional management support is needed unless otherwise documented below in the visit note. 

## 2017-02-17 NOTE — Patient Instructions (Addendum)
A few things to remember from today's visit:   Generalized osteoarthritis of multiple sites  Anxiety and depression - Plan: buPROPion (WELLBUTRIN SR) 150 MG 12 hr tablet  Essential hypertension - Plan: Basic metabolic panel  RLS (restless legs syndrome)  Vitamin D deficiency - Plan: VITAMIN D 25 Hydroxy (Vit-D Deficiency, Fractures)  Wellbutrin increased. Rest unchanged.  Fall precautions.  In about 5-6 weeks please let me know through My Chart or by calling the office about tolerance and benefits of increasing Wellbutrin.  Please be sure medication list is accurate. If a new problem present, please set up appointment sooner than planned today.

## 2017-02-26 ENCOUNTER — Other Ambulatory Visit: Payer: Medicare Other

## 2017-02-26 ENCOUNTER — Ambulatory Visit (HOSPITAL_BASED_OUTPATIENT_CLINIC_OR_DEPARTMENT_OTHER): Payer: Medicare Other | Admitting: Oncology

## 2017-02-26 ENCOUNTER — Other Ambulatory Visit: Payer: Self-pay | Admitting: Adult Health

## 2017-02-26 VITALS — BP 141/64 | HR 53 | Temp 98.2°F | Resp 18 | Ht 65.0 in | Wt 235.3 lb

## 2017-02-26 DIAGNOSIS — C50412 Malignant neoplasm of upper-outer quadrant of left female breast: Secondary | ICD-10-CM

## 2017-02-26 DIAGNOSIS — Z79811 Long term (current) use of aromatase inhibitors: Secondary | ICD-10-CM | POA: Diagnosis not present

## 2017-02-26 DIAGNOSIS — Z170 Estrogen receptor positive status [ER+]: Secondary | ICD-10-CM | POA: Diagnosis not present

## 2017-02-26 DIAGNOSIS — D0512 Intraductal carcinoma in situ of left breast: Secondary | ICD-10-CM | POA: Diagnosis not present

## 2017-02-26 MED ORDER — ANASTROZOLE 1 MG PO TABS: 1.0000 mg | ORAL_TABLET | Freq: Every day | ORAL | 3 refills | Status: DC

## 2017-02-26 NOTE — Progress Notes (Signed)
[place]  Telephone:([phone] [phone]) [phone]   ID: Tamara [name] OB: Aug 08, 1951  MR#: [id number]  MGQ#:[id number]  PCP: Tamara Martinique, MD GYN:   SU: Tamara [name] OTHER MD: Tamara [name]  CHIEF COMPLAINT: left breast ductal carcinoma in situ.  CURRENT TREATMENT: anastrozole   BREAST CANCER HISTORY: Tamara [name] had routine screening mammography at the [facility] Center 10/03/2013 showing suspicious calcifications in the left breast. Additional views 10/25/2013 confirmed a 2.6 area of pleomorphic calcification in the subareolar left breast. There was no associated mass. Needle core biopsy could not be performed for body habitus reasons. Excisional biopsy of this area 01/24/2014 showed ([id number]) ductal carcinoma in situ, high-grade, estrogen receptor 91% positive with strong staining intensity, progesterone receptor negative. There were multiple margins that were "very close" (less than half a millimeter).  Accordingly on 02/02/2014 the patient underwent further left breast excision. Margins remained close, but now are clear. The patient had an uneventful postoperative course and t proceeded to adjuvant radiation, which will be completed late May 2015.  Her subsequent history is as detailed below.  INTERVAL HISTORY: Tamara [name] returns today for follow-up of her estrogen receptor positive breast cancer. She continues on anastrozole, with good tolerance.Hot flashes and vaginal dryness are not a major issue. She never developed the arthralgias or myalgias that many patients can experience on this medication. She obtains it at a good price.   REVIEW OF SYSTEMS: Since her gastric sleep surgery February 2017 she's lost more than 100 pounds. She feels better, is walking more, and she tells me she can now trim her own toenails. She is doing well on the post-bariatric surgery diet although she still tends to be constipated. She has an intertriginous rash which is being treated by her  dermatologist Dr. [place] [name], but otherwise a detailed review of systems today was stable.  PAST MEDICAL HISTORY: Past Medical History:  Diagnosis Date  . Arthritis    knees   . Asthma    rare;only when around alot of dust-Ventolin inhaler as needed  . Breast cancer ([place])     left. States she did not have lymph nodes removed  . Bursitis of right shoulder   . Cellulitis and abscess of leg   . Complication of anesthesia    was told 01/06/14 that airway was small  . Depression    takes Effexor and Wellbutrin daily  . Hearing loss   . History of bronchitis 1966  . Hyperlipidemia   . Hypertension    takes Coreg and Losartan daily  . Joint pain   . Joint swelling   . Obese   . Peripheral edema    takes Furosemide daily as needed and Potassium daily  . Radiation 03/08/14-04/26/14   50.4 gray to left breast. Lumpectomy cavity boosted to 64.4 gray  . Restless legs syndrome    takes depakote  . Sleep apnea, obstructive    uses CPAP  . Wears glasses     PAST SURGICAL HISTORY: Past Surgical History:  Procedure Laterality Date  . BREAST LUMPECTOMY WITH NEEDLE LOCALIZATION Left 01/24/2014   Procedure: BREAST LUMPECTOMY WITH NEEDLE LOCALIZATION;  Surgeon: Tamara Jolly, MD;  Location: [facility];  Service: General;  Laterality: Left;  . COLONOSCOPY    . DILATATION & CURETTAGE/HYSTEROSCOPY WITH TRUECLEAR N/A 01/06/2014   Procedure: DILATATION & CURETTAGE/HYSTEROSCOPY WITH TRUCLEAR;  Surgeon: Tamara Millet II, MD;  Location: [facility];  Service: Gynecology;  Laterality: N/A;  . HYSTEROPLASTY  01/2014  . INNER EAR SURGERY  Bilateral    for hearing loss  . JOINT REPLACEMENT Right    Knee  . LAPAROSCOPIC GASTRIC SLEEVE RESECTION N/A 01/28/2016   Procedure: LAPAROSCOPIC GASTRIC SLEEVE RESECTION WITH UPPER ENDO;  Surgeon: Tamara Seltzer, MD;  Location: [facility];  Service: General;  Laterality: N/A;  . RE-EXCISION OF BREAST LUMPECTOMY Left 02/02/2014   Procedure: RE-EXCISION OF LEFT BREAST LUMPECTOMY;   Surgeon: Tamara Jolly, MD;  Location: [facility];  Service: General;  Laterality: Left;  . TUBAL LIGATION      FAMILY HISTORY Family History  Problem Relation Age of Onset  . Dementia Mother   . Heart attack Mother   . Esophageal cancer Father     also had stomach cancer  . Heart attack Father   . Asthma Other   . Hypertension Other   . Thyroid disease Other   . Heart attack Other   . Throat cancer Paternal Grandfather    the patient's father died in his late 47s from throat and stomach cancer. The patient's mother died in her early 40s with congestive heart failure and dementia. The patient had 2 brothers, no sisters. There is no history of breast or ovarian cancer in the family  GYNECOLOGIC HISTORY:   Menarche age 53, first live birth age 5, she is [place] P3. She went through menopause more than 15 years ago. She did not take hormone replacement. 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 She is divorced and lives by herself, with no pets. Son Tamara [name] works as an Clinical biochemist for [employer]. Son Tamara [name] works as a Printmaker for the same company. Daughter Tamara [name] is an Scientist, physiological for this could feel. Care in his 4 grandchildren "bilobed", to "by blood", and one on the way.   ADVANCED DIRECTIVES:  in place. The patient has named her son Tamara [name] as her healthcare power of attorney. He can be reached at [phone]   HEALTH MAINTENANCE: Social History  Substance Use Topics  . Smoking status: Never Smoker  . Smokeless tobacco: Never Used  . Alcohol use No     Colonoscopy: 2008/[facility]   PAP: 2014   [facility]   Lipid panel:  No Known Allergies  Current Outpatient Prescriptions  Medication Sig Dispense Refill  . amLODipine (NORVASC) 2.5 MG tablet Take 2.5 mg by mouth daily.     Marland Kitchen anastrozole (ARIMIDEX) 1 MG  tablet Take 1 tablet (1 mg total) by mouth daily. 90 tablet 3  . aspirin 81 MG tablet Take 81 mg by mouth daily.      Marland Kitchen buPROPion (WELLBUTRIN SR) 150 MG 12 hr tablet Take 1 tablet (150 mg total) by mouth 2 (two) times daily. 60 tablet 3  . Calcium Carbonate-Vitamin D (CALCIUM-VITAMIN D) 500-200 MG-UNIT tablet Take 1 tablet by mouth daily.    . carvedilol (COREG) 12.5 MG tablet Take 1 tablet (12.5 mg total) by mouth 2 (two) times daily with a meal. 60 tablet 3  . cloNIDine (CATAPRES - DOSED IN MG/24 HR) 0.1 mg/24hr patch Place 1 patch (0.1 mg total) onto the skin once a week. APPLY ONE PATCH TOPICALLY ONCE A WEEK USE FOR HEADACHES 4 patch 0  . Cyanocobalamin (B-12 PO) Take by mouth. 1/2 teaspoon    . diphenhydramine-acetaminophen (TYLENOL PM) 25-500 MG TABS Take 2 tablets by mouth at bedtime as needed (sleep).    Marland Kitchen  fluticasone (CUTIVATE) 0.05 % cream Apply 1 application topically as needed (rash). 30 g 1  . furosemide (LASIX) 40 MG tablet Take 0.5 tablets (20 mg total) by mouth daily as needed for edema. 90 tablet 0  . gabapentin (NEURONTIN) 300 MG capsule Take 2 capsules (600 mg total) by mouth at bedtime. 90 capsule 3  . HYDROcodone-acetaminophen (NORCO/VICODIN) 5-325 MG tablet Take 1 tablet by mouth every 12 (twelve) hours as needed for moderate pain. 40 tablet 0  . ketoconazole (NIZORAL) 2 % cream Apply 1 application topically daily as needed (rash). 15 g 4  . linaclotide (LINZESS) 290 MCG CAPS capsule Take 1 capsule (290 mcg total) by mouth daily before breakfast. 90 capsule 3  . LORazepam (ATIVAN) 1 MG tablet TAKE ONE TABLET BY MOUTH TWICE DAILY AS NEEDED 60 tablet 0  . losartan (COZAAR) 100 MG tablet Take 1 tablet (100 mg total) by mouth daily. 90 tablet 1  . Melatonin ER 5 MG TBCR Take 1 tablet by mouth at bedtime. 90 tablet 3  . OVER THE COUNTER MEDICATION flintstone with iron    . pantoprazole (PROTONIX) 40 MG tablet Take 40 mg by mouth daily.    . Pediatric Multiple Vitamins  (MULTIVITAMIN) LIQD Take 5 mLs by mouth daily.    Marland Kitchen venlafaxine XR (EFFEXOR-XR) 150 MG 24 hr capsule Take 1 capsule (150 mg total) by mouth daily with breakfast. 30 capsule 3  . VENTOLIN HFA 108 (90 Base) MCG/ACT inhaler INHALE TWO PUFFS BY MOUTH EVERY 6 HOURS AS NEEDED FOR WHEEZING OR SHORTNESS OF BREATH 36 each 3   No current facility-administered medications for this visit.     OBJECTIVE: Middle-aged white woman who appears stated age 43:   02/26/17 0831  BP: (!) 141/64  Pulse: (!) 53  Resp: 18  Temp: 98.2 F (36.8 C)     Body mass index is 39.16 kg/m.    ECOG FS:1 - Symptomatic but completely ambulatory  Sclerae unicteric, pupils round and equal Oropharynx clear and moist No cervical or supraclavicular adenopathy Lungs no rales or rhonchi Heart regular rate and rhythm Abd soft,Obese, nontender, positive bowel sounds MSK no focal spinal tenderness, no upper extremity lymphedema Neuro: nonfocal, well oriented, appropriate affect Breasts: The right breast is benign. The left breast is status post lumpectomy and radiation with no evidence of local recurrence. Both axillae are benign.   LAB RESULTS: I No results found for: SPEP  Lab Results  Component Value Date   WBC 7.0 02/13/2016   NEUTROABS 5.1 02/13/2016   HGB 14.2 02/13/2016   HCT 43.7 02/13/2016   MCV 86.1 02/13/2016   PLT 176 02/13/2016      Chemistry      Component Value Date/Time   NA 140 02/17/2017 1029   NA 140 02/13/2016 1411   K 4.4 02/17/2017 1029   K 4.4 02/13/2016 1411   CL 105 02/17/2017 1029   CO2 30 02/17/2017 1029   CO2 28 02/13/2016 1411   BUN 26 (H) 02/17/2017 1029   BUN 30.8 (H) 02/13/2016 1411   CREATININE 0.84 02/17/2017 1029   CREATININE 0.9 02/13/2016 1411      Component Value Date/Time   CALCIUM 9.9 02/17/2017 1029   CALCIUM 9.9 02/13/2016 1411   ALKPHOS 61 02/13/2016 1411   AST 21 02/13/2016 1411   ALT 32 02/13/2016 1411   BILITOT 0.44 02/13/2016 1411       No  results found for: LABCA2  No components found for: [id number]  No results  for input(s): INR in the last 168 hours.  Urinalysis    Component Value Date/Time   COLORURINE yellow 03/19/2010 0815   APPEARANCEUR Clear 03/19/2010 0815   LABSPEC 1.015 03/19/2010 0815   PHURINE 7.0 03/19/2010 0815   GLUCOSEU NEGATIVE 11/14/2009 1155   HGBUR negative 03/19/2010 0815   BILIRUBINUR n 09/21/2014 1024   KETONESUR NEGATIVE 11/14/2009 1155   PROTEINUR n 09/21/2014 1024   PROTEINUR NEGATIVE 11/14/2009 1155   UROBILINOGEN 0.2 09/21/2014 1024   UROBILINOGEN 0.2 03/19/2010 0815   NITRITE n 09/21/2014 1024   NITRITE negative 03/19/2010 0815   LEUKOCYTESUR Negative 09/21/2014 1024    STUDIES: Mammography 07/22/2016 found the breast density to be category B. There was no evidence of malignancy.   ASSESSMENT: 66 y.o. status post left lumpectomy 01/24/2014 for ductal carcinoma in situ, high-grade, estrogen receptor 91% positive, progesterone receptor negative, with positive margins  (1) status post further left breast excision for margin clearance 02/02/2014, achieving close but negative margins.  (2) undergoing radiation therapy to the left breast completed 04/26/2014  (3) anastrozole started May 2015  (a) bone density scan November 2015 showed a T score of -2.0  (b) vitamin D level III/XX/MMXVIII was 46.01   PLAN: Audry is now 3 years out from definitive surgery for her noninvasive breast cancer with no evidence of disease recurrence. This is very favorable.  She is tolerating anastrozole well. The plan is to continue that for a total of 5 years.  I encouraged her to continue her walking program. She has a good vitamin D level. She understands we do not check labs here since her tumor was noninvasive and routine lab work is obtained from her primary care physician  She knows to call for any problems that may develop before her next visit here, which will be in one year MAGRINAT,GUSTAV C, MD    02/26/2017 8:45 AM

## 2017-03-04 ENCOUNTER — Other Ambulatory Visit: Payer: Self-pay | Admitting: Oncology

## 2017-03-06 ENCOUNTER — Other Ambulatory Visit: Payer: Self-pay | Admitting: *Deleted

## 2017-03-06 DIAGNOSIS — G4700 Insomnia, unspecified: Secondary | ICD-10-CM

## 2017-03-06 DIAGNOSIS — G2581 Restless legs syndrome: Secondary | ICD-10-CM

## 2017-03-06 MED ORDER — GABAPENTIN 300 MG PO CAPS: 600.0000 mg | ORAL_CAPSULE | Freq: Every day | ORAL | 0 refills | Status: DC

## 2017-03-10 ENCOUNTER — Other Ambulatory Visit: Payer: Self-pay

## 2017-03-10 DIAGNOSIS — G4700 Insomnia, unspecified: Secondary | ICD-10-CM

## 2017-03-10 DIAGNOSIS — G2581 Restless legs syndrome: Secondary | ICD-10-CM

## 2017-03-10 MED ORDER — GABAPENTIN 300 MG PO CAPS: 600.0000 mg | ORAL_CAPSULE | Freq: Every day | ORAL | 0 refills | Status: DC

## 2017-03-18 DIAGNOSIS — Z9884 Bariatric surgery status: Secondary | ICD-10-CM | POA: Diagnosis not present

## 2017-03-20 ENCOUNTER — Encounter (HOSPITAL_COMMUNITY): Payer: Self-pay | Admitting: *Deleted

## 2017-03-20 ENCOUNTER — Ambulatory Visit (HOSPITAL_COMMUNITY): Payer: Medicare Other | Admitting: Anesthesiology

## 2017-03-20 ENCOUNTER — Other Ambulatory Visit (HOSPITAL_BASED_OUTPATIENT_CLINIC_OR_DEPARTMENT_OTHER): Payer: Self-pay

## 2017-03-20 ENCOUNTER — Encounter (HOSPITAL_COMMUNITY)
Admission: RE | Disposition: A | Discharge status: Home / Self Care | Payer: Self-pay | Source: Ambulatory Visit | Attending: Gastroenterology

## 2017-03-20 ENCOUNTER — Ambulatory Visit (HOSPITAL_COMMUNITY)
Admission: RE | Admit: 2017-03-20 | Discharge: 2017-03-20 | Disposition: A | Discharge status: Home / Self Care | Payer: Medicare Other | Source: Ambulatory Visit | Attending: Gastroenterology | Admitting: Gastroenterology

## 2017-03-20 DIAGNOSIS — R600 Localized edema: Secondary | ICD-10-CM | POA: Diagnosis not present

## 2017-03-20 DIAGNOSIS — J45909 Unspecified asthma, uncomplicated: Secondary | ICD-10-CM | POA: Diagnosis not present

## 2017-03-20 DIAGNOSIS — Z923 Personal history of irradiation: Secondary | ICD-10-CM | POA: Diagnosis not present

## 2017-03-20 DIAGNOSIS — I10 Essential (primary) hypertension: Secondary | ICD-10-CM | POA: Diagnosis not present

## 2017-03-20 DIAGNOSIS — G2581 Restless legs syndrome: Secondary | ICD-10-CM | POA: Diagnosis not present

## 2017-03-20 DIAGNOSIS — Z1212 Encounter for screening for malignant neoplasm of rectum: Secondary | ICD-10-CM | POA: Diagnosis not present

## 2017-03-20 DIAGNOSIS — Z7982 Long term (current) use of aspirin: Secondary | ICD-10-CM | POA: Insufficient documentation

## 2017-03-20 DIAGNOSIS — D125 Benign neoplasm of sigmoid colon: Secondary | ICD-10-CM | POA: Diagnosis not present

## 2017-03-20 DIAGNOSIS — G4733 Obstructive sleep apnea (adult) (pediatric): Secondary | ICD-10-CM | POA: Insufficient documentation

## 2017-03-20 DIAGNOSIS — Z79899 Other long term (current) drug therapy: Secondary | ICD-10-CM | POA: Insufficient documentation

## 2017-03-20 DIAGNOSIS — Z1211 Encounter for screening for malignant neoplasm of colon: Secondary | ICD-10-CM | POA: Diagnosis not present

## 2017-03-20 DIAGNOSIS — Z853 Personal history of malignant neoplasm of breast: Secondary | ICD-10-CM | POA: Insufficient documentation

## 2017-03-20 DIAGNOSIS — K648 Other hemorrhoids: Secondary | ICD-10-CM | POA: Insufficient documentation

## 2017-03-20 DIAGNOSIS — K635 Polyp of colon: Secondary | ICD-10-CM

## 2017-03-20 DIAGNOSIS — K5730 Diverticulosis of large intestine without perforation or abscess without bleeding: Secondary | ICD-10-CM | POA: Diagnosis not present

## 2017-03-20 DIAGNOSIS — Z79811 Long term (current) use of aromatase inhibitors: Secondary | ICD-10-CM | POA: Diagnosis not present

## 2017-03-20 DIAGNOSIS — E669 Obesity, unspecified: Secondary | ICD-10-CM | POA: Insufficient documentation

## 2017-03-20 DIAGNOSIS — Z96651 Presence of right artificial knee joint: Secondary | ICD-10-CM | POA: Diagnosis not present

## 2017-03-20 DIAGNOSIS — E785 Hyperlipidemia, unspecified: Secondary | ICD-10-CM | POA: Diagnosis not present

## 2017-03-20 DIAGNOSIS — F329 Major depressive disorder, single episode, unspecified: Secondary | ICD-10-CM | POA: Insufficient documentation

## 2017-03-20 DIAGNOSIS — E559 Vitamin D deficiency, unspecified: Secondary | ICD-10-CM | POA: Diagnosis not present

## 2017-03-20 HISTORY — PX: COLONOSCOPY: SHX5424

## 2017-03-20 SURGERY — COLONOSCOPY
Anesthesia: Monitor Anesthesia Care

## 2017-03-20 MED ORDER — LACTATED RINGERS IV SOLN
INTRAVENOUS | Status: DC
Start: 2017-03-20 — End: 2017-03-20
  Administered 2017-03-20: 1000 mL via INTRAVENOUS

## 2017-03-20 MED ORDER — LIDOCAINE 2% (20 MG/ML) 5 ML SYRINGE
INTRAMUSCULAR | Status: AC
  Filled 2017-03-20: qty 5

## 2017-03-20 MED ORDER — LIDOCAINE 2% (20 MG/ML) 5 ML SYRINGE
INTRAMUSCULAR | Status: DC | PRN
  Administered 2017-03-20: 100 mg via INTRAVENOUS

## 2017-03-20 MED ORDER — PROPOFOL 10 MG/ML IV BOLUS
INTRAVENOUS | Status: AC
  Filled 2017-03-20: qty 40

## 2017-03-20 MED ORDER — SODIUM CHLORIDE 0.9 % IV SOLN: INTRAVENOUS | Status: DC

## 2017-03-20 MED ORDER — PROPOFOL 500 MG/50ML IV EMUL
INTRAVENOUS | Status: DC | PRN
  Administered 2017-03-20: 100 ug/kg/min via INTRAVENOUS

## 2017-03-20 MED ORDER — PROPOFOL 10 MG/ML IV BOLUS
INTRAVENOUS | Status: DC | PRN
  Administered 2017-03-20: 20 mg via INTRAVENOUS
  Administered 2017-03-20: 30 mg via INTRAVENOUS

## 2017-03-20 NOTE — Op Note (Signed)
[name] Patient Name: Tamara [name] Procedure Date: 03/20/2017 MRN: [id number] Attending MD: Mauri Pole , MD Date of Birth: Mar 01, 1951 CSN: [id number] Age: 66 Admit Type: Outpatient Procedure:                Colonoscopy Indications:              Screening for malignant neoplasm in the rectum,                            Last colonoscopy: 2007 Providers:                Mauri Pole, MD, Hilma Favors, RN, Cherylynn Ridges, Technician, [facility], CRNA Referring MD:              Medicines:                Monitored Anesthesia Care Complications:            No immediate complications. Estimated Blood Loss:     Estimated blood loss was minimal. Procedure:                Pre-Anesthesia Assessment:                           - Prior to the procedure, a History and Physical                            was performed, and patient medications and                            allergies were reviewed. The patient's tolerance of                            previous anesthesia was also reviewed. The risks                            and benefits of the procedure and the sedation                            options and risks were discussed with the patient.                            All questions were answered, and informed consent                            was obtained. Prior Anticoagulants: The patient has                            taken no previous anticoagulant or antiplatelet                            agents. ASA Grade Assessment: III - A patient with  severe systemic disease. After reviewing the risks                            and benefits, the patient was deemed in                            satisfactory condition to undergo the procedure.                           After obtaining informed consent, the colonoscope                            was passed under direct vision. Throughout the              procedure, the patient's blood pressure, pulse, and                            oxygen saturations were monitored continuously. The                            was introduced through the anus and advanced to the                            the cecum, identified by appendiceal orifice and                            ileocecal valve. The colonoscopy was performed                            without difficulty. The patient tolerated the                            procedure well. The quality of the bowel                            preparation was adequate. The ileocecal valve,                            appendiceal orifice, and rectum were photographed. Scope In: 10:05:44 AM Scope Out: 10:29:44 AM Scope Withdrawal Time: 0 hours 16 minutes 15 seconds  Total Procedure Duration: 0 hours 24 minutes 0 seconds  Findings:      The perianal and digital rectal examinations were normal.      A 11 mm polyp was found in the sigmoid colon. The polyp was       pedunculated. The polyp was removed with a hot snare. Resection and       retrieval were complete.      Scattered small-mouthed diverticula were found in the sigmoid colon and       descending colon.      Non-bleeding internal hemorrhoids were found during retroflexion. The       hemorrhoids were small. Impression:               - One 11 mm polyp in the sigmoid colon, removed  with a hot snare. Resected and retrieved.                           - Diverticulosis in the sigmoid colon and in the                            descending colon.                           - Non-bleeding internal hemorrhoids. Moderate Sedation:      N/A- Per Anesthesia Care Recommendation:           - Patient has a contact number available for                            emergencies. The signs and symptoms of potential                            delayed complications were discussed with the                            patient. Return to normal  activities tomorrow.                            Written discharge instructions were provided to the                            patient.                           - Resume previous diet.                           - Continue present medications.                           - Await pathology results.                           - Repeat colonoscopy in 3 years for surveillance                            based on pathology results.                           - Return to GI clinic PRN. Procedure Code(s):        --- Professional ---                           [phone], Colonoscopy, flexible; with removal of                            tumor(s), polyp(s), or other lesion(s) by snare                            technique Diagnosis Code(s):        --- Professional ---  D12.5, Benign neoplasm of sigmoid colon                           K64.8, Other hemorrhoids                           Z12.12, Encounter for screening for malignant                            neoplasm of rectum                           K57.30, Diverticulosis of large intestine without                            perforation or abscess without bleeding CPT copyright 2016 [employer]. All rights reserved. The codes documented in this report are preliminary and upon coder review may  be revised to meet current compliance requirements. Mauri Pole, MD 03/20/2017 10:39:00 AM This report has been signed electronically. Number of Addenda: 0

## 2017-03-20 NOTE — H&P (Signed)
[facility] History and Physical   Primary Care Physician:  Betty Martinique, MD   Reason for Procedure:   Colorectal cancer screening  Plan:    Colonoscopy with possible intervention   HPI: Tamara [name] is a 66 y.o. female with obesity, asthma, hypertension, hyperlipidemia with history of difficult intubation due to small airway in the past here for screening colonoscopy. Denies any nausea, vomiting, abdominal pain, melena or bright red blood per rectum    Past Medical History:  Diagnosis Date  . Arthritis    knees   . Asthma    rare;only when around alot of dust-Ventolin inhaler as needed  . Breast cancer ([place])     left. States she did not have lymph nodes removed  . Bursitis of right shoulder   . Cellulitis and abscess of leg   . Complication of anesthesia    was told 01/06/14 that airway was small  . Depression    takes Effexor and Wellbutrin daily  . Hearing loss   . History of bronchitis 1966  . Hyperlipidemia   . Hypertension    takes Coreg and Losartan daily  . Joint pain   . Joint swelling   . Obese   . Peripheral edema    takes Furosemide daily as needed and Potassium daily  . Radiation 03/08/14-04/26/14   50.4 gray to left breast. Lumpectomy cavity boosted to 64.4 gray  . Restless legs syndrome    takes depakote  . Sleep apnea, obstructive    uses CPAP  . Wears glasses     Past Surgical History:  Procedure Laterality Date  . BREAST LUMPECTOMY WITH NEEDLE LOCALIZATION Left 01/24/2014   Procedure: BREAST LUMPECTOMY WITH NEEDLE LOCALIZATION;  Surgeon: Edward Jolly, MD;  Location: [place];  Service: General;  Laterality: Left;  . COLONOSCOPY    . DILATATION & CURETTAGE/HYSTEROSCOPY WITH TRUECLEAR N/A 01/06/2014   Procedure: DILATATION & CURETTAGE/HYSTEROSCOPY WITH TRUCLEAR;  Surgeon: Shon Millet II, MD;  Location: [facility];  Service: Gynecology;  Laterality: N/A;  . HYSTEROPLASTY  01/2014  . INNER EAR SURGERY Bilateral    for hearing loss  .  JOINT REPLACEMENT Right    Knee  . LAPAROSCOPIC GASTRIC SLEEVE RESECTION N/A 01/28/2016   Procedure: LAPAROSCOPIC GASTRIC SLEEVE RESECTION WITH UPPER ENDO;  Surgeon: Excell Seltzer, MD;  Location: [facility];  Service: General;  Laterality: N/A;  . RE-EXCISION OF BREAST LUMPECTOMY Left 02/02/2014   Procedure: RE-EXCISION OF LEFT BREAST LUMPECTOMY;  Surgeon: Edward Jolly, MD;  Location: [place];  Service: General;  Laterality: Left;  . TUBAL LIGATION      Prior to Admission medications   Medication Sig Start Date End Date Taking? Authorizing Provider  amLODipine (NORVASC) 2.5 MG tablet Take 2.5 mg by mouth daily.    Yes Historical Provider, MD  anastrozole (ARIMIDEX) 1 MG tablet Take 1 tablet (1 mg total) by mouth daily. 02/26/17  Yes Chauncey Cruel, MD  aspirin EC 81 MG tablet Take 81 mg by mouth at bedtime.   Yes Historical Provider, MD  buPROPion (WELLBUTRIN SR) 150 MG 12 hr tablet Take 1 tablet (150 mg total) by mouth 2 (two) times daily. 02/17/17  Yes Betty G Martinique, MD  Calcium Carbonate-Vitamin D (CALCIUM-VITAMIN D) 500-200 MG-UNIT tablet Take 1 tablet by mouth daily.   Yes Historical Provider, MD  carvedilol (COREG) 12.5 MG tablet Take 1 tablet (12.5 mg total) by mouth 2 (two) times daily with a meal. 07/21/16  Yes Betty G Martinique,  MD  cloNIDine (CATAPRES - DOSED IN MG/24 HR) 0.1 mg/24hr patch Place 1 patch (0.1 mg total) onto the skin once a week. APPLY ONE PATCH TOPICALLY ONCE A WEEK USE FOR HEADACHES Patient taking differently: Place 0.1 mg onto the skin every Tuesday.  09/22/16  Yes Chauncey Cruel, MD  Cyanocobalamin (VITAMIN B-12) 1000 MCG/15ML LIQD Take 5 mLs by mouth daily.   Yes Historical Provider, MD  diphenhydramine-acetaminophen (TYLENOL PM) 25-500 MG TABS Take 2 tablets by mouth at bedtime as needed (for sleep).    Yes Historical Provider, MD  flintstones complete (FLINTSTONES) 60 MG chewable tablet Chew 1 tablet by mouth daily.   Yes Historical Provider, MD  fluticasone  (CUTIVATE) 0.05 % cream Apply 1 application topically as needed (rash). 02/12/15  Yes Chauncey Cruel, MD  furosemide (LASIX) 40 MG tablet Take 0.5 tablets (20 mg total) by mouth daily as needed for edema.   Yes Betty G Martinique, MD  gabapentin (NEURONTIN) 300 MG capsule Take 2 capsules (600 mg total) by mouth at bedtime. 03/10/17  Yes Chauncey Cruel, MD  HYDROcodone-acetaminophen (NORCO/VICODIN) 5-325 MG tablet Take 1 tablet by mouth every 12 (twelve) hours as needed for moderate pain. 02/06/17  Yes Betty G Martinique, MD  ketoconazole (NIZORAL) 2 % cream Apply 1 application topically daily as needed (rash). 02/12/15  Yes Chauncey Cruel, MD  linaclotide [facility]) 290 MCG CAPS capsule Take 1 capsule (290 mcg total) by mouth daily before breakfast. 07/11/16  Yes Betty G Martinique, MD  LORazepam (ATIVAN) 1 MG tablet TAKE ONE TABLET BY MOUTH TWICE DAILY AS NEEDED Patient taking differently: Take 1 tablet by mouth twice daily as needed for anxiety 11/19/16  Yes Dorena Cookey, MD  losartan (COZAAR) 100 MG tablet Take 1 tablet (100 mg total) by mouth daily. 02/05/17  Yes Betty G Martinique, MD  Melatonin ER 5 MG TBCR Take 1 tablet by mouth at bedtime. 07/11/16  Yes Betty G Martinique, MD  pantoprazole (PROTONIX) 40 MG tablet Take 40 mg by mouth daily.   Yes Historical Provider, MD  Pediatric Multiple Vitamins (MULTIVITAMIN) LIQD Take 5 mLs by mouth daily.   Yes Historical Provider, MD  venlafaxine XR (EFFEXOR-XR) 150 MG 24 hr capsule Take 1 capsule (150 mg total) by mouth daily with breakfast. 01/13/17  Yes Chauncey Cruel, MD  VENTOLIN HFA 108 (90 Base) MCG/ACT inhaler INHALE TWO PUFFS BY MOUTH EVERY 6 HOURS AS NEEDED FOR WHEEZING OR SHORTNESS OF BREATH 11/19/16  Yes Dorena Cookey, MD    Current Facility-Administered Medications  Medication Dose Route Frequency Provider Last Rate Last Dose  . 0.9 %  sodium chloride infusion   Intravenous Continuous Culver Feighner V, MD      . lactated ringers infusion   Intravenous  Continuous Harl Bowie V, MD 125 mL/hr at 03/20/17 0900 1,000 mL at 03/20/17 0900    Allergies as of 01/23/2017  . (No Known Allergies)    Family History  Problem Relation Age of Onset  . Dementia Mother   . Heart attack Mother   . Esophageal cancer Father     also had stomach cancer  . Heart attack Father   . Asthma Other   . Hypertension Other   . Thyroid disease Other   . Heart attack Other   . Throat cancer Paternal Grandfather     Social History   Social History  . Marital status: Divorced    Spouse name: N/A  . Number of children: 3  .  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 Smoking status: Never Smoker  . Smokeless tobacco: Never Used  . Alcohol use No  . Drug use: No  . Sexual activity: No   Other Topics Concern  . Not on file   Social History Narrative  . No narrative on file    Review of Systems:  All other review of systems negative except as mentioned in the HPI.  Physical Exam: Vital signs in last 24 hours: Temp:  [97.9 F (36.6 C)] 97.9 F (36.6 C) (04/20 0902) Pulse Rate:  [46] 46 (04/20 0902) Resp:  [18] 18 (04/20 0902) BP: (177)/(76) 177/76 (04/20 0902) SpO2:  [100 %] 100 % (04/20 0902) Weight:  [235 lb (106.6 kg)] 235 lb (106.6 kg) (04/20 0902)   General:   Alert,  Well-developed, well-nourished, pleasant and cooperative in NAD Lungs:  Clear throughout to auscultation.   Heart:  Regular rate and rhythm; no murmurs, clicks, rubs,  or gallops. Abdomen:  Soft, nontender and nondistended. Normal bowel sounds.   Neuro/Psych:  Alert and cooperative. Normal mood and affect. A and O x 3   @K .Denzil Magnuson, MD [phone] Mon-Fri 8a-5p [phone] after 5p, weekends, holidays 03/20/2017 9:36 AM@

## 2017-03-20 NOTE — Anesthesia Preprocedure Evaluation (Signed)
Anesthesia Evaluation  Patient identified by MRN, date of birth, ID band Patient awake    Reviewed: Allergy & Precautions, NPO status , Patient's Chart, lab work & pertinent test results  Airway Mallampati: II  TM Distance: >3 FB Neck ROM: Full    Dental no notable dental hx.    Pulmonary sleep apnea and Continuous Positive Airway Pressure Ventilation ,    Pulmonary exam normal breath sounds clear to auscultation       Cardiovascular hypertension, Normal cardiovascular exam Rhythm:Regular Rate:Normal     Neuro/Psych negative neurological ROS  negative psych ROS   GI/Hepatic negative GI ROS, Neg liver ROS,   Endo/Other  obesity  Renal/GU negative Renal ROS  negative genitourinary   Musculoskeletal negative musculoskeletal ROS (+)   Abdominal   Peds negative pediatric ROS (+)  Hematology negative hematology ROS (+)   Anesthesia Other Findings   Reproductive/Obstetrics negative OB ROS                             Anesthesia Physical Anesthesia Plan  ASA: III  Anesthesia Plan: MAC   Post-op Pain Management:    Induction: Intravenous  Airway Management Planned:   Additional Equipment:   Intra-op Plan:   Post-operative Plan:   Informed Consent: I have reviewed the patients History and Physical, chart, labs and discussed the procedure including the risks, benefits and alternatives for the proposed anesthesia with the patient or authorized representative who has indicated his/her understanding and acceptance.   Dental advisory given  Plan Discussed with: CRNA and Surgeon  Anesthesia Plan Comments:         Anesthesia Quick Evaluation

## 2017-03-20 NOTE — Transfer of Care (Signed)
Immediate Anesthesia Transfer of Care Note  Patient: Tamara [name]  Procedure(s) Performed: Procedure(s): COLONOSCOPY (N/A)  Patient Location: PACU  Anesthesia Type:MAC  Level of Consciousness: awake, alert  and oriented  Airway & Oxygen Therapy: Patient Spontanous Breathing and Patient connected to face mask oxygen  Post-op Assessment: Report given to RN and Post -op Vital signs reviewed and stable  Post vital signs: Reviewed and stable  Last Vitals:  Vitals:   03/20/17 0902  BP: (!) 177/76  Pulse: (!) 46  Resp: 18  Temp: 36.6 C    Last Pain:  Vitals:   03/20/17 0902  TempSrc: Oral         Complications: No apparent anesthesia complications

## 2017-03-20 NOTE — Discharge Instructions (Signed)
YOU HAD AN ENDOSCOPIC PROCEDURE TODAY: Refer to the procedure report and other information in the discharge instructions given to you for any specific questions about what was found during the examination. If this information does not answer your questions, please call [place] office at [phone] to clarify.  ° °YOU SHOULD EXPECT: Some feelings of bloating in the abdomen. Passage of more gas than usual. Walking can help get rid of the air that was put into your GI tract during the procedure and reduce the bloating. If you had a lower endoscopy (such as a colonoscopy or flexible sigmoidoscopy) you may notice spotting of blood in your stool or on the toilet paper. Some abdominal soreness may be present for a day or two, also. ° °DIET: Your first meal following the procedure should be a light meal and then it is ok to progress to your normal diet. A half-sandwich or bowl of soup is an example of a good first meal. Heavy or fried foods are harder to digest and may make you feel nauseous or bloated. Drink plenty of fluids but you should avoid alcoholic beverages for 24 hours. If you had a esophageal dilation, please see attached instructions for diet.   ° °ACTIVITY: Your care partner should take you home directly after the procedure. You should plan to take it easy, moving slowly for the rest of the day. You can resume normal activity the day after the procedure however YOU SHOULD NOT DRIVE, use power tools, machinery or perform tasks that involve climbing or major physical exertion for 24 hours (because of the sedation medicines used during the test).  ° °SYMPTOMS TO REPORT IMMEDIATELY: °A gastroenterologist can be reached at any hour. Please call [phone]  for any of the following symptoms:  °Following lower endoscopy (colonoscopy, flexible sigmoidoscopy) °Excessive amounts of blood in the stool  °Significant tenderness, worsening of abdominal pains  °Swelling of the abdomen that is new, acute  °Fever of 100° or  higher  °Following upper endoscopy (EGD, EUS, ERCP, esophageal dilation) °Vomiting of blood or coffee ground material  °New, significant abdominal pain  °New, significant chest pain or pain under the shoulder blades  °Painful or persistently difficult swallowing  °New shortness of breath  °Black, tarry-looking or red, bloody stools ° °FOLLOW UP:  °If any biopsies were taken you will be contacted by phone or by letter within the next 1-3 weeks. Call [phone]  if you have not heard about the biopsies in 3 weeks.  °Please also call with any specific questions about appointments or follow up tests. ° °

## 2017-03-20 NOTE — Anesthesia Postprocedure Evaluation (Addendum)
Anesthesia Post Note  Patient: Tamara [name]  Procedure(s) Performed: Procedure(s) (LRB): COLONOSCOPY (N/A)  Patient location during evaluation: PACU Anesthesia Type: MAC Level of consciousness: awake and alert Pain management: pain level controlled Vital Signs Assessment: post-procedure vital signs reviewed and stable Respiratory status: spontaneous breathing, nonlabored ventilation, respiratory function stable and patient connected to nasal cannula oxygen Cardiovascular status: stable and blood pressure returned to baseline Anesthetic complications: no       Last Vitals:  Vitals:   03/20/17 1055 03/20/17 1100  BP:  (!) 182/64  Pulse: (!) 48 (!) 47  Resp: 17 (!) 26  Temp:      Last Pain:  Vitals:   03/20/17 1037  TempSrc: Oral                 Peggy Loge S

## 2017-03-23 ENCOUNTER — Encounter (HOSPITAL_COMMUNITY): Payer: Self-pay | Admitting: Gastroenterology

## 2017-03-23 ENCOUNTER — Encounter: Payer: Self-pay | Admitting: Gastroenterology

## 2017-04-13 ENCOUNTER — Other Ambulatory Visit: Payer: Self-pay | Admitting: Oncology

## 2017-04-20 ENCOUNTER — Other Ambulatory Visit: Payer: Self-pay | Admitting: Oncology

## 2017-04-23 ENCOUNTER — Other Ambulatory Visit: Payer: Self-pay | Admitting: *Deleted

## 2017-04-23 MED ORDER — CLINDAMYCIN PHOSPHATE 1 % EX GEL: Freq: Two times a day (BID) | CUTANEOUS | 0 refills | Status: DC

## 2017-05-04 NOTE — Addendum Note (Signed)
Addendum  created 05/04/17 1331 by Myrtie Soman, MD   Sign clinical note

## 2017-05-15 ENCOUNTER — Other Ambulatory Visit: Payer: Self-pay | Admitting: Oncology

## 2017-05-17 ENCOUNTER — Encounter (HOSPITAL_BASED_OUTPATIENT_CLINIC_OR_DEPARTMENT_OTHER): Payer: Self-pay

## 2017-06-10 ENCOUNTER — Other Ambulatory Visit: Payer: Self-pay | Admitting: Oncology

## 2017-06-15 DIAGNOSIS — H25013 Cortical age-related cataract, bilateral: Secondary | ICD-10-CM | POA: Diagnosis not present

## 2017-06-15 DIAGNOSIS — H25043 Posterior subcapsular polar age-related cataract, bilateral: Secondary | ICD-10-CM | POA: Diagnosis not present

## 2017-06-19 ENCOUNTER — Ambulatory Visit: Payer: Self-pay | Admitting: Family Medicine

## 2017-06-21 ENCOUNTER — Ambulatory Visit (HOSPITAL_BASED_OUTPATIENT_CLINIC_OR_DEPARTMENT_OTHER): Payer: Medicare Other | Attending: General Surgery | Admitting: Internal Medicine

## 2017-06-21 VITALS — Ht 66.0 in | Wt 180.0 lb

## 2017-06-21 DIAGNOSIS — G4736 Sleep related hypoventilation in conditions classified elsewhere: Secondary | ICD-10-CM | POA: Diagnosis not present

## 2017-06-21 DIAGNOSIS — Z9989 Dependence on other enabling machines and devices: Secondary | ICD-10-CM | POA: Diagnosis not present

## 2017-06-21 DIAGNOSIS — G4761 Periodic limb movement disorder: Secondary | ICD-10-CM | POA: Insufficient documentation

## 2017-06-21 DIAGNOSIS — G4723 Circadian rhythm sleep disorder, irregular sleep wake type: Secondary | ICD-10-CM | POA: Diagnosis not present

## 2017-06-21 DIAGNOSIS — G4733 Obstructive sleep apnea (adult) (pediatric): Secondary | ICD-10-CM

## 2017-06-22 ENCOUNTER — Other Ambulatory Visit: Payer: Self-pay

## 2017-06-24 ENCOUNTER — Other Ambulatory Visit: Payer: Self-pay | Admitting: General Surgery

## 2017-06-24 DIAGNOSIS — Z9889 Other specified postprocedural states: Secondary | ICD-10-CM

## 2017-06-28 NOTE — Progress Notes (Signed)
HPI:   Tamara [name] is a 66 y.o. female, who is here today for 3-4 months follow up.   She was last seen on 02/17/17.  Since her last OV she has followed with Dr Annamaria Boots, pulmonologist. Hx of OSA, she had sleep study done. She also had her colonoscopy and followed with Dr Sarajane Jews for Hx of left breast cancer.  HTN: Currently she is on Losartan 100 mg daily, Carvedilol 12.5 mg bid, and Amlodipine 2.5 mg daily. She is also on Clonidine 0.1 patch weekly for headaches. Denies headache, visual changes, chest pain, dyspnea, palpitation, claudication, focal weakness, or worsening edema.  Eye exam last week, beginning of cataracts and rest negative. Lab Results  Component Value Date   CREATININE 0.84 02/17/2017   BUN 26 (H) 02/17/2017   NA 140 02/17/2017   K 4.4 02/17/2017   CL 105 02/17/2017   CO2 30 02/17/2017     Generalized OA/chronic pain:  Knees,hips, lower back and shoulders pain mainly. Exacerbated by certain movements, prolonged standing/walking, getting up after prolonged sitting, and cold weather. Alleviated by changing positions and rest.  Currently she is on Hydrocodone-Acetaminophen 5-325 mg 1/2 tab am and 1 tab pm. She has not taken medication lately because weather has been warm and this helps with pain. Rainy days and cold weather makes it worse.  4/10 if prolonged standing or walking. She is tolerating medication well. Side effects: None  Anxiety and depression:  Currently she is on Wellbutrin SR 150 mg bid, Effexor XR 150 mg daily, and Lorazepam 1 mg bid prn. She denies suicidal thoughts. Wellbutrin was also added to help with wt loss.  She has increased physical activity and has been consistent with her diet, has noted wt loss.  Wt loss has helped with mood. She still is not allowed to keep her granddaughter, which she will love to do but now she is dealing better with this. She is also helping with care of her cousin's father 3 times per week,  she visits with him and they both walk around his house, he has a bid yard.  Lately she has not been sleeping well, she is still taking Gabapentin at bedtime , which was helping before. She thinks this is related to her OSA, CPAP has not been adjusted based on her last sleep study.  She has had some mildly elevated BS's, last GLU 118 on 02/17/17)  Review of Systems  Constitutional: Negative for activity change, appetite change, fatigue and fever.  HENT: Negative for mouth sores, nosebleeds and trouble swallowing.   Eyes: Negative for redness and visual disturbance.  Respiratory: Negative for cough, shortness of breath and wheezing.   Cardiovascular: Negative for chest pain, palpitations and leg swelling.  Gastrointestinal: Negative for abdominal pain, nausea and vomiting.       Negative for changes in bowel habits.  Endocrine: Negative for polydipsia, polyphagia and polyuria.  Genitourinary: Negative for decreased urine volume, difficulty urinating, dysuria and hematuria.  Musculoskeletal: Positive for arthralgias and back pain. Negative for gait problem.  Skin: Negative for rash.  Neurological: Negative for syncope, weakness and headaches.  Psychiatric/Behavioral: Positive for sleep disturbance. Negative for confusion and suicidal ideas. The patient is not nervous/anxious.      Current Outpatient Prescriptions on File Prior to Visit  Medication Sig Dispense Refill  . anastrozole (ARIMIDEX) 1 MG tablet Take 1 tablet (1 mg total) by mouth daily. 90 tablet 3  . aspirin EC 81 MG tablet Take 81  mg by mouth at bedtime.    . carvedilol (COREG) 12.5 MG tablet Take 1 tablet (12.5 mg total) by mouth 2 (two) times daily with a meal. 60 tablet 3  . clindamycin (CLINDAGEL) 1 % gel Apply topically 2 (two) times daily. 30 g 0  . cloNIDine (CATAPRES - DOSED IN MG/24 HR) 0.1 mg/24hr patch Place 1 patch (0.1 mg total) onto the skin once a week. APPLY ONE PATCH TOPICALLY ONCE A WEEK USE FOR HEADACHES  (Patient taking differently: Place 0.1 mg onto the skin every Tuesday. ) 4 patch 0  . Cyanocobalamin (VITAMIN B-12) 1000 MCG/15ML LIQD Take 5 mLs by mouth daily.    . diphenhydramine-acetaminophen (TYLENOL PM) 25-500 MG TABS Take 2 tablets by mouth at bedtime as needed (for sleep).     . flintstones complete (FLINTSTONES) 60 MG chewable tablet Chew 1 tablet by mouth daily.    . fluconazole (DIFLUCAN) 150 MG tablet TAKE ONE TABLET BY MOUTH EVERY OTHER DAY ONLY  AS  NEEDED  FOR  YEAST 17 tablet 17  . fluticasone (CUTIVATE) 0.05 % cream Apply 1 application topically as needed (rash). 30 g 1  . furosemide (LASIX) 40 MG tablet Take 0.5 tablets (20 mg total) by mouth daily as needed for edema. 90 tablet 0  . HYDROcodone-acetaminophen (NORCO/VICODIN) 5-325 MG tablet Take 1 tablet by mouth every 12 (twelve) hours as needed for moderate pain. 40 tablet 0  . ketoconazole (NIZORAL) 2 % cream Apply 1 application topically daily as needed (rash). 15 g 4  . linaclotide (LINZESS) 290 MCG CAPS capsule Take 1 capsule (290 mcg total) by mouth daily before breakfast. 90 capsule 3  . LORazepam (ATIVAN) 1 MG tablet TAKE ONE TABLET BY MOUTH TWICE DAILY AS NEEDED (Patient taking differently: Take 1 tablet by mouth twice daily as needed for anxiety) 60 tablet 0  . losartan (COZAAR) 100 MG tablet Take 1 tablet (100 mg total) by mouth daily. 90 tablet 1  . Melatonin ER 5 MG TBCR Take 1 tablet by mouth at bedtime. 90 tablet 3  . pantoprazole (PROTONIX) 40 MG tablet Take 40 mg by mouth daily.    . Pediatric Multiple Vitamins (MULTIVITAMIN) LIQD Take 5 mLs by mouth daily.    Marland Kitchen venlafaxine XR (EFFEXOR-XR) 150 MG 24 hr capsule Take 1 capsule (150 mg total) by mouth daily with breakfast. 30 capsule 3  . VENTOLIN HFA 108 (90 Base) MCG/ACT inhaler INHALE TWO PUFFS BY MOUTH EVERY 6 HOURS AS NEEDED FOR WHEEZING OR SHORTNESS OF BREATH 36 each 3   No current facility-administered medications on file prior to visit.      Past  Medical History:  Diagnosis Date  . Arthritis    knees   . Asthma    rare;only when around alot of dust-Ventolin inhaler as needed  . Breast cancer ([place])     left. States she did not have lymph nodes removed  . Bursitis of right shoulder   . Cellulitis and abscess of leg   . Complication of anesthesia    was told 01/06/14 that airway was small  . Depression    takes Effexor and Wellbutrin daily  . Hearing loss   . History of bronchitis 1966  . Hyperlipidemia   . Hypertension    takes Coreg and Losartan daily  . Joint pain   . Joint swelling   . Obese   . Peripheral edema    takes Furosemide daily as needed and Potassium daily  . Radiation 03/08/14-04/26/14  50.4 gray to left breast. Lumpectomy cavity boosted to 64.4 gray  . Restless legs syndrome    takes depakote  . Sleep apnea, obstructive    uses CPAP  . Wears glasses    No Known Allergies  Social History   Social History  . Marital status: Divorced    Spouse name: N/A  . Number of children: 3  . 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 Smoking status: Never Smoker  . Smokeless tobacco: Never Used  . Alcohol use No  . Drug use: No  . Sexual activity: No   Other Topics Concern  . None   Social History Narrative  . None    Vitals:   06/29/17 1056  BP: 130/80  Pulse: 66  Resp: 12  O2 sat at RA 97% Body mass index is 36.54 kg/m.  Wt Readings from Last 3 Encounters:  06/29/17 226 lb 6 oz (102.7 kg)  06/21/17 180 lb (81.6 kg)  03/20/17 235 lb (106.6 kg)    Physical Exam  Nursing note and vitals reviewed. Constitutional: She is oriented to person, place, and time. She appears well-developed. No distress.  HENT:  Head: Atraumatic.  Mouth/Throat: Oropharynx is clear and moist and mucous membranes are normal.  Eyes: Pupils are equal, round, and reactive to light. Conjunctivae and EOM are normal.  Cardiovascular:  Normal rate and regular rhythm.   No murmur heard. Pulses:      Dorsalis pedis pulses are 2+ on the right side, and 2+ on the left side.  Respiratory: Effort normal and breath sounds normal. No respiratory distress.  GI: Soft. She exhibits no mass. There is no hepatomegaly. There is no tenderness.  Musculoskeletal: She exhibits edema (Trace pitting LE edema, bilateral.).       Thoracic back: She exhibits no tenderness and no bony tenderness.       Lumbar back: She exhibits no tenderness and no bony tenderness.  Knee crepitus bilateral,no pain elicited with ROM, mild limitation of flexion.   Lymphadenopathy:    She has no cervical adenopathy.  Neurological: She is alert and oriented to person, place, and time. She has normal strength. Coordination and gait normal.  Skin: Skin is warm. No rash noted. No erythema.  Psychiatric: She has a normal mood and affect.  Well groomed, good eye contact.    ASSESSMENT AND PLAN:   Tamara [name] was seen today for 4 months follow-up.   Diagnoses and all orders for this visit:  Generalized osteoarthritis of multiple sites  Well controlled in general. Med contract signed today. Side effects of opioid meds discussed as well as the risk when taking benzo meds. F/U in 5 months since she is not taking Hydrocodone very often.  Essential hypertension  Adequately controlled. No changes in current management. DASH diet recommended. Eye exam current. F/U in 5 months, before if needed.  -     amLODipine (NORVASC) 2.5 MG tablet; Take 1 tablet (2.5 mg total) by mouth daily.  Anxiety and depression  Improved. No changes in current management. Some side effects from medication discussed. F/U in 5 months.  -     buPROPion (WELLBUTRIN SR) 150 MG 12 hr tablet; Take 1 tablet (150 mg total) by mouth 2 (two) times daily.  Hyperglycemia  A1C in normal range. Prevention through a healthy life style recommended.  -     POCT glycosylated  hemoglobin (Hb A1C)  Class 2 obesity with serious comorbidity and body mass index (BMI) of 36.0 to 36.9 in adult, unspecified obesity type  Congratulated for wt loss since her last OV, she has lost about 12 Lb since 01/2017. We discussed benefits of wt loss as well as adverse effects of obesity. Consistency with healthy diet and physical activity recommended.     -Ms. TAGEN BRETHAUER was advised to return sooner than planned today if new concerns arise.       Betty G. Martinique, MD  [facility]. [place] office.

## 2017-06-29 ENCOUNTER — Ambulatory Visit (INDEPENDENT_AMBULATORY_CARE_PROVIDER_SITE_OTHER): Payer: Medicare Other | Admitting: Family Medicine

## 2017-06-29 ENCOUNTER — Encounter: Payer: Self-pay | Admitting: Family Medicine

## 2017-06-29 VITALS — BP 130/80 | HR 66 | Resp 12 | Ht 66.0 in | Wt 226.4 lb

## 2017-06-29 DIAGNOSIS — I10 Essential (primary) hypertension: Secondary | ICD-10-CM | POA: Diagnosis not present

## 2017-06-29 DIAGNOSIS — F419 Anxiety disorder, unspecified: Secondary | ICD-10-CM | POA: Diagnosis not present

## 2017-06-29 DIAGNOSIS — M159 Polyosteoarthritis, unspecified: Secondary | ICD-10-CM

## 2017-06-29 DIAGNOSIS — Z6836 Body mass index (BMI) 36.0-36.9, adult: Secondary | ICD-10-CM | POA: Diagnosis not present

## 2017-06-29 DIAGNOSIS — R739 Hyperglycemia, unspecified: Secondary | ICD-10-CM | POA: Diagnosis not present

## 2017-06-29 DIAGNOSIS — E669 Obesity, unspecified: Secondary | ICD-10-CM | POA: Diagnosis not present

## 2017-06-29 DIAGNOSIS — F329 Major depressive disorder, single episode, unspecified: Secondary | ICD-10-CM

## 2017-06-29 DIAGNOSIS — IMO0001 Reserved for inherently not codable concepts without codable children: Secondary | ICD-10-CM

## 2017-06-29 LAB — POCT GLYCOSYLATED HEMOGLOBIN (HGB A1C): Hemoglobin A1C: 5.4

## 2017-06-29 NOTE — Patient Instructions (Addendum)
A few things to remember from today's visit:   Generalized osteoarthritis of multiple sites  Essential hypertension  Anxiety and depression  Hyperglycemia - Plan: POCT glycosylated hemoglobin (Hb A1C)   [name], today we have followed on some of your chronic medical problems and they seem to be stable, so no changes in current management today.  Review medication list and be sure it is accurate.  -Remember a healthy diet and regular physical activity are very important for prevention as well as for well being; they also help with many chronic problems, decreasing the need of adding new medications and delaying or preventing possible complications.  I will see you back in 5 months.  Remember to arrange your follow up appt before leaving today.  Please follow sooner than planned if a new concern arises.  Please be sure medication list is accurate. If a new problem present, please set up appointment sooner than planned today.

## 2017-06-30 MED ORDER — AMLODIPINE BESYLATE 2.5 MG PO TABS: 2.5000 mg | ORAL_TABLET | Freq: Every day | ORAL | 2 refills | Status: DC

## 2017-06-30 MED ORDER — BUPROPION HCL ER (SR) 150 MG PO TB12: 150.0000 mg | ORAL_TABLET | Freq: Two times a day (BID) | ORAL | 4 refills | Status: DC

## 2017-07-04 DIAGNOSIS — G4733 Obstructive sleep apnea (adult) (pediatric): Secondary | ICD-10-CM | POA: Diagnosis not present

## 2017-07-04 NOTE — Procedures (Signed)
   Patient Name: Tamara [name], Tamara [name] Date: 06/21/2017 Gender: Female D.O.B: [birth year] Age (years): 28 Referring Provider: Lenard Simmer MD Height (inches): 66 Interpreting Physician: Baird Lyons MD, ABSM Weight (lbs): 180 RPSGT: Earney [place] BMI: 29 MRN: [id number] Neck Size: 16.00 CLINICAL INFORMATION Sleep Study Type: NPSG  Indication for sleep study: OSA  Epworth Sleepiness Score: 1  SLEEP STUDY TECHNIQUE As per the AASM Manual for the Scoring of Sleep and Associated Events v2.3 (April 2016) with a hypopnea requiring 4% desaturations.  The channels recorded and monitored were frontal, central and occipital EEG, electrooculogram (EOG), submentalis EMG (chin), nasal and oral airflow, thoracic and abdominal wall motion, anterior tibialis EMG, snore microphone, electrocardiogram, and pulse oximetry.  MEDICATIONS Medications self-administered by patient taken the night of the study : ADVIL PM, MELATONIN, GABAPENTIN, BAYER ASPIRIN  SLEEP ARCHITECTURE The study was initiated at 9:44:33 PM and ended at 4:33:36 AM.  Sleep onset time was 53.8 minutes and the sleep efficiency was 80.1%. The total sleep time was 327.8 minutes.  Stage REM latency was 60.0 minutes.  The patient spent 2.75% of the night in stage N1 sleep, 85.35% in stage N2 sleep, 0.00% in stage N3 and 11.90% in REM.  Alpha intrusion was absent.  Supine sleep was 48.82%.  RESPIRATORY PARAMETERS The overall apnea/hypopnea index (AHI) was 7.7 per hour. There were 7 total apneas, including 7 obstructive, 0 central and 0 mixed apneas. There were 35 hypopneas and 30 RERAs.  The AHI during Stage REM sleep was 9.2 per hour.  AHI while supine was 10.1 per hour.  The mean oxygen saturation was 92.97%. The minimum SpO2 during sleep was 87.00%.  Moderate snoring was noted during this study.  CARDIAC DATA The 2 lead EKG demonstrated sinus rhythm. The mean heart rate was 48.98 beats per minute. Other EKG  findings include: None.  LEG MOVEMENT DATA The total PLMS were 222 with a resulting PLMS index of 40.64. Associated arousal with leg movement index was 0.9 . IMPRESSIONS - Mild obstructive sleep apnea occurred during this study (AHI = 7.7/h). - Insufficient early events to meet protocol requirements for split CPAP titration. - No significant central sleep apnea occurred during this study (CAI = 0.0/h). - Oxygen desaturation was noted during this study (Min O2 = 87.00%,, Mean 93%). - The patient snored with Moderate snoring volume. - No cardiac abnormalities were noted during this study. - Moderate periodic limb movements of sleep occurred during the study. No significant associated arousals.  DIAGNOSIS - Obstructive Sleep Apnea (327.23 [G47.33 ICD-10]) - Nocturnal Hypoxemia (327.26 [G47.36 ICD-10] - Periodic Limb Movement Syndrome (327.51 [G47.61 ICD-10])  RECOMMENDATIONS - Very mild obstructive sleep apnea. If treated, weight loss, sleep off back, chin strap, oral appliance or CPAP might be appropriate. - Avoid alcohol, sedatives and other CNS depressants that may worsen sleep apnea and disrupt normal sleep architecture. - Sleep hygiene should be reviewed to assess factors that may improve sleep quality. - Weight management and regular exercise should be initiated or continued if appropriate.  [Electronically signed] 07/04/2017 12:09 PM  Baird Lyons MD, [place], American Board of Sleep Medicine   NPI: [id number] [place], American Board of Sleep Medicine  ELECTRONICALLY SIGNED ON:  07/04/2017, 12:10 PM [place] PH: [phone]   FX: [phone] [place]

## 2017-07-13 ENCOUNTER — Telehealth: Payer: Self-pay | Admitting: Family Medicine

## 2017-07-13 DIAGNOSIS — G4733 Obstructive sleep apnea (adult) (pediatric): Secondary | ICD-10-CM

## 2017-07-13 DIAGNOSIS — Z9989 Dependence on other enabling machines and devices: Principal | ICD-10-CM

## 2017-07-13 NOTE — Telephone Encounter (Signed)
Pt calling stating that she needs a CPAP machine but she does not have a pulmonary doctor.

## 2017-07-13 NOTE — Telephone Encounter (Signed)
Would you like to do a referral to pulmonology?

## 2017-07-14 NOTE — Telephone Encounter (Signed)
It is Ok to place referral to pulmonology. Dx OSA on CPAP.  Thanks, BJ

## 2017-07-14 NOTE — Telephone Encounter (Signed)
Referral placed, patient is aware.  

## 2017-07-23 ENCOUNTER — Other Ambulatory Visit: Payer: Self-pay | Admitting: Oncology

## 2017-07-27 ENCOUNTER — Ambulatory Visit
Admission: RE | Admit: 2017-07-27 | Discharge: 2017-07-27 | Disposition: A | Discharge status: Home / Self Care | Payer: Medicare Other | Source: Ambulatory Visit | Attending: General Surgery | Admitting: General Surgery

## 2017-07-27 DIAGNOSIS — R922 Inconclusive mammogram: Secondary | ICD-10-CM | POA: Diagnosis not present

## 2017-07-27 DIAGNOSIS — Z9889 Other specified postprocedural states: Secondary | ICD-10-CM

## 2017-07-27 HISTORY — DX: Personal history of irradiation: Z92.3

## 2017-08-18 DIAGNOSIS — N762 Acute vulvitis: Secondary | ICD-10-CM | POA: Diagnosis not present

## 2017-08-18 DIAGNOSIS — Z01419 Encounter for gynecological examination (general) (routine) without abnormal findings: Secondary | ICD-10-CM | POA: Diagnosis not present

## 2017-08-18 DIAGNOSIS — Z6838 Body mass index (BMI) 38.0-38.9, adult: Secondary | ICD-10-CM | POA: Diagnosis not present

## 2017-08-20 ENCOUNTER — Encounter: Payer: Self-pay | Admitting: Family Medicine

## 2017-08-26 ENCOUNTER — Other Ambulatory Visit: Payer: Self-pay | Admitting: Oncology

## 2017-09-14 ENCOUNTER — Ambulatory Visit (INDEPENDENT_AMBULATORY_CARE_PROVIDER_SITE_OTHER): Payer: Medicare Other | Admitting: *Deleted

## 2017-09-14 DIAGNOSIS — Z23 Encounter for immunization: Secondary | ICD-10-CM

## 2017-09-25 ENCOUNTER — Other Ambulatory Visit: Payer: Self-pay | Admitting: Family Medicine

## 2017-09-25 ENCOUNTER — Telehealth: Payer: Self-pay | Admitting: Family Medicine

## 2017-09-25 DIAGNOSIS — M159 Polyosteoarthritis, unspecified: Secondary | ICD-10-CM

## 2017-09-25 NOTE — Telephone Encounter (Signed)
Is this okay to fill? 

## 2017-09-25 NOTE — Telephone Encounter (Signed)
Patient needs a refill on the below medication.  HYDROcodone-acetaminophen (NORCO/VICODIN) 5-325 MG tablet

## 2017-09-28 MED ORDER — HYDROCODONE-ACETAMINOPHEN 5-325 MG PO TABS: 1.0000 | ORAL_TABLET | Freq: Two times a day (BID) | ORAL | 0 refills | Status: DC | PRN

## 2017-09-28 NOTE — Telephone Encounter (Signed)
Talked to patient, stated she will pick up prescription on tomorrow.

## 2017-09-28 NOTE — Telephone Encounter (Signed)
Last Rx for Hydrocodone-Acetaminophen filled on 03/10/17. It is OK to print Rx for Hydrocodone-Acetaminophen 5-325 mg to continue 1 tab bid as needed, 40 tabs for 30 days/o refills.  Thanks, BJ

## 2017-10-02 ENCOUNTER — Ambulatory Visit (INDEPENDENT_AMBULATORY_CARE_PROVIDER_SITE_OTHER): Payer: Medicare Other | Admitting: Family Medicine

## 2017-10-02 VITALS — BP 126/74 | HR 68 | Temp 98.3°F | Resp 12 | Ht 66.0 in | Wt 221.0 lb

## 2017-10-02 DIAGNOSIS — M5440 Lumbago with sciatica, unspecified side: Secondary | ICD-10-CM

## 2017-10-02 DIAGNOSIS — M159 Polyosteoarthritis, unspecified: Secondary | ICD-10-CM

## 2017-10-02 DIAGNOSIS — Z79899 Other long term (current) drug therapy: Secondary | ICD-10-CM | POA: Diagnosis not present

## 2017-10-02 DIAGNOSIS — K5900 Constipation, unspecified: Secondary | ICD-10-CM | POA: Diagnosis not present

## 2017-10-02 DIAGNOSIS — B372 Candidiasis of skin and nail: Secondary | ICD-10-CM | POA: Diagnosis not present

## 2017-10-02 MED ORDER — HYDROCODONE-ACETAMINOPHEN 5-325 MG PO TABS: 1.0000 | ORAL_TABLET | Freq: Two times a day (BID) | ORAL | 0 refills | Status: DC | PRN

## 2017-10-02 MED ORDER — PREDNISONE 20 MG PO TABS: ORAL_TABLET | ORAL | 0 refills | Status: DC

## 2017-10-02 NOTE — Patient Instructions (Addendum)
A few things to remember from today's visit:   Bilateral low back pain with sciatica, sciatica laterality unspecified, unspecified chronicity - Plan: predniSONE (DELTASONE) 20 MG tablet  Generalized osteoarthritis of multiple sites - Plan: HYDROcodone-acetaminophen (NORCO/VICODIN) 5-325 MG tablet, DISCONTINUED: HYDROcodone-acetaminophen (NORCO/VICODIN) 5-325 MG tablet, DISCONTINUED: HYDROcodone-acetaminophen (NORCO/VICODIN) 5-325 MG tablet  High risk medication use - Plan: Pain Mgmt, Profile 8 w/Conf, U  Intertriginous candidiasis    Back pain is very common in adults.The cause of back pain is rarely dangerous and the pain often gets better over time even with no pharmacologic treatment.  The cause of your back pain may not be known. Some common causes of back pain include: 1. Strain of the muscles or ligaments supporting the spine. 2. Wear and tear (degeneration) of the spinal disks. 3. Arthritis. 4. Direct injury to the back.  For many people, back pain may return. Since back pain is rarely dangerous, most people can learn to manage this condition on their own.  HOME CARE INSTRUCTIONS Watch your back pain for any changes. The following actions may help to lessen any discomfort you are feeling:  1. Remain active. It is stressful on your back to sit or stand in one place for long periods of time. Do not sit, drive, or stand in one place for more than 30 minutes at a time. Take short walks on even surfaces as soon as you are able.Try to increase the length of time you walk each day.  2. Exercise regularly as directed by your health care provider. Exercise helps your back heal faster. It also helps avoid future injury by keeping your muscles strong and flexible.  3. Do not stay in bed.Resting more than 1-2 days can delay your recovery.                                                      4. Pay attention to your body when you bend and lift. The most comfortable positions are those  that put less stress on your recovering back.  5.  Always use proper lifting techniques, including: Bending your knees. Keeping the load close to your body. Avoiding twisting.  6. Find a comfortable position to sleep. Use a firm mattress and lie on your side with your knees slightly bent. If you lie on your back, put a pillow under your knees.  7. Over the counter rubbing medications like Icy Hot or Asper cream with Lidocaine may help without significant side effects.  Acetaminophen and/or Aleve/Ibuprofen can be taken if needed and if not contraindications. Local ice and heat may be alternated to reduce pain and spasms. Also massage and even chiropractor treatment.      Muscle relaxants might or might not help, they cause drowsiness among other    side effects. They could also interact with some of medications you may be already taking (medications for depression/anxiety and some pain medications).   8. Maintain a healthy weight. Excess weight puts extra stress on your back and makes it difficult to maintain good posture.   SEEK MEDICAL CARE IF: worsening pain, associated fever, rash/edema on area, pain going to legs or buttocks, numbness/tingling, night pain, or abnormal weight loss.    SEEK IMMEDIATE MEDICAL CARE IF:  1. You develop new bowel or bladder control problems. 2. You have unusual weakness or numbness  in your arms or legs. 3. You develop nausea or vomiting. 4. You develop abdominal pain. 5. You feel faint.     Back Exercises The following exercises strengthen the muscles that help to support the back. They also help to keep the lower back flexible. Doing these exercises can help to prevent back pain or lessen existing pain. If you have back pain or discomfort, try doing these exercises 2-3 times each day or as told by your health care provider. When the pain goes away, do them once each day, but increase the number of times that you repeat the steps for each exercise (do  more repetitions). If you do not have back pain or discomfort, do these exercises once each day or as told by your health care provider.   EXERCISES Single Knee to Chest Repeat these steps 3-5 times for each leg: 5. Lie on your back on a firm bed or the floor with your legs extended. 6. Bring one knee to your chest. Your other leg should stay extended and in contact with the floor. 7. Hold your knee in place by grabbing your knee or thigh. 8. Pull on your knee until you feel a gentle stretch in your lower back. 9. Hold the stretch for 10-30 seconds. 10. Slowly release and straighten your leg.  Pelvic Tilt Repeat these steps 5-10 times: 2. Lie on your back on a firm bed or the floor with your legs extended. 3. Bend your knees so they are pointing toward the ceiling and your feet are flat on the floor. 4. Tighten your lower abdominal muscles to press your lower back against the floor. This motion will tilt your pelvis so your tailbone points up toward the ceiling instead of pointing to your feet or the floor. 5. With gentle tension and even breathing, hold this position for 5-10 seconds.  Cat-Cow Repeat these steps until your lower back becomes more flexible: 1. Get into a hands-and-knees position on a firm surface. Keep your hands under your shoulders, and keep your knees under your hips. You may place padding under your knees for comfort. 2. Let your head hang down, and point your tailbone toward the floor so your lower back becomes rounded like the back of a cat. 3. Hold this position for 5 seconds. 4. Slowly lift your head and point your tailbone up toward the ceiling so your back forms a sagging arch like the back of a cow. 5. Hold this position for 5 seconds.   Press-Ups Repeat these steps 5-10 times: 6. Lie on your abdomen (face-down) on the floor. 7. Place your palms near your head, about shoulder-width apart. 8. While you keep your back as relaxed as possible and keep your  hips on the floor, slowly straighten your arms to raise the top half of your body and lift your shoulders. Do not use your back muscles to raise your upper torso. You may adjust the placement of your hands to make yourself more comfortable. 9. Hold this position for 5 seconds while you keep your back relaxed. 10. Slowly return to lying flat on the floor.   Bridges Repeat these steps 10 times: 1. Lie on your back on a firm surface. 2. Bend your knees so they are pointing toward the ceiling and your feet are flat on the floor. 3. Tighten your buttocks muscles and lift your buttocks off of the floor until your waist is at almost the same height as your knees. You should feel the muscles  working in your buttocks and the back of your thighs. If you do not feel these muscles, slide your feet 1-2 inches farther away from your buttocks. 4. Hold this position for 3-5 seconds. 5. Slowly lower your hips to the starting position, and allow your buttocks muscles to relax completely. If this exercise is too easy, try doing it with your arms crossed over your chest.    Please be sure medication list is accurate. If a new problem present, please set up appointment sooner than planned today.

## 2017-10-02 NOTE — Progress Notes (Signed)
ACUTE VISIT   HPI:  Chief Complaint  Patient presents with  . Radiating back pain down leg    [name] is a 66 y.o. female, who is here today complaining of worsening lower back pain for the past few days and radiated to LE's for 2 days.  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 Her job involves moving and lifting heavy packages to place in bags. Also twisting and prolonged standing. Since she started working her lower back pain has been getting worse.     She denies any recent injury.  Pain is radiated to both hips and thighs, achy/sharp like, constant, sometimes "unbearable."   Denies associated LE numbness, tingling, urinary incontinence or retention, stool incontinence, or saddle anesthesia.  Exacerbated by movement,prolonged standing. Alleviated by rest. No rash or edema on affected area, fever, chills, or abnormal wt loss.     OTC medications: Motrin, "Thermal" patches and topical OTC menthol.   Pain management  Hx of chronic pain, mainly knee pain. Related to generalized OA: Shoulders,hips,knees, and back. She is on Hydrocodone-Acetaminophen 5-325 mg at bedtime mainly. She was on tramadol before but she had to take several during the day and I was also concerned about interaction with some of her other meds. She follows with ortho for right shoulder pain.  Hx of depression and anxiety, otherwise stable.  She is also on Lorazepam 1 mg bid prn, Wellbutrin SR 150 mg bid,and Effexor XR 150 mg daily.  Denies suicidal thoughts.   Hx of constipation, if she takes more than 1 tab of Hydrocodone, problems gets worse. Linzess 290 mcg daily helps.  Denies abdominal pain, nausea, vomiting,blood in stool or melena.  S/P bariatric surgery, she has also followed a healthier diet. Because wt loss she has loose skin and has had recurrent ,erytematous and pruritic rash on lower abdomen. She has followed with dermatologists,former  PCP, and gyn. She has used multiple topical and oral treatments but rash does not resolved. It seems worse during hot weather. Denies tenderness, drainage,fever,or chills.  Next appt with surgeon in 12/2017.     Review of Systems  Constitutional: Negative for activity change, appetite change, fatigue and fever.  HENT: Negative for mouth sores, sore throat and trouble swallowing.   Respiratory: Negative for cough, shortness of breath and wheezing.   Cardiovascular: Negative for leg swelling.  Gastrointestinal: Positive for constipation. Negative for abdominal pain, nausea and vomiting.       Negative for changes in bowel habits.  Endocrine: Negative for polydipsia, polyphagia and polyuria.  Genitourinary: Negative for decreased urine volume, dysuria and hematuria.  Musculoskeletal: Positive for arthralgias and back pain. Negative for gait problem.  Skin: Positive for rash. Negative for wound.  Neurological: Negative for syncope, weakness, numbness and headaches.  Hematological: Negative for adenopathy. Does not bruise/bleed easily.  Psychiatric/Behavioral: Positive for sleep disturbance. Negative for confusion and suicidal ideas. The patient is nervous/anxious.       Current Outpatient Medications on File Prior to Visit  Medication Sig Dispense Refill  . amLODipine (NORVASC) 2.5 MG tablet Take 1 tablet (2.5 mg total) by mouth daily. 90 tablet 2  . anastrozole (ARIMIDEX) 1 MG tablet Take 1 tablet (1 mg total) by mouth daily. 90 tablet 3  . aspirin EC 81 MG tablet Take 81 mg by mouth at bedtime.    Marland Kitchen buPROPion (WELLBUTRIN SR) 150 MG 12 hr tablet Take 1 tablet (150 mg total) by mouth  2 (two) times daily. 60 tablet 4  . Calcium Carbonate-Vitamin D (CALCIUM PLUS VITAMIN D PO) Take 1 tablet by mouth daily.    . carvedilol (COREG) 12.5 MG tablet Take 1 tablet (12.5 mg total) by mouth 2 (two) times daily with a meal. 60 tablet 3  . clindamycin (CLINDAGEL) 1 % gel Apply topically 2 (two)  times daily. 30 g 0  . cloNIDine (CATAPRES - DOSED IN MG/24 HR) 0.1 mg/24hr patch Place 1 patch (0.1 mg total) onto the skin once a week. APPLY ONE PATCH TOPICALLY ONCE A WEEK USE FOR HEADACHES (Patient taking differently: Place 0.1 mg onto the skin every Tuesday. ) 4 patch 0  . cloNIDine (CATAPRES - DOSED IN MG/24 HR) 0.1 mg/24hr patch APPLY 1 PATCH TOPICALLY ONCE A WEEK, USE FOR HEADACHES 4 patch 0  . Cyanocobalamin (VITAMIN B-12) 1000 MCG/15ML LIQD Take 5 mLs by mouth daily.    . diphenhydramine-acetaminophen (TYLENOL PM) 25-500 MG TABS Take 2 tablets by mouth at bedtime as needed (for sleep).     . flintstones complete (FLINTSTONES) 60 MG chewable tablet Chew 1 tablet by mouth daily.    . fluconazole (DIFLUCAN) 150 MG tablet TAKE ONE TABLET BY MOUTH EVERY OTHER DAY ONLY  AS  NEEDED  FOR  YEAST 17 tablet 17  . fluticasone (CUTIVATE) 0.05 % cream Apply 1 application topically as needed (rash). 30 g 1  . furosemide (LASIX) 40 MG tablet Take 0.5 tablets (20 mg total) by mouth daily as needed for edema. 90 tablet 0  . ketoconazole (NIZORAL) 2 % cream Apply 1 application topically daily as needed (rash). 15 g 4  . LORazepam (ATIVAN) 1 MG tablet TAKE ONE TABLET BY MOUTH TWICE DAILY AS NEEDED (Patient taking differently: Take 1 tablet by mouth twice daily as needed for anxiety) 60 tablet 0  . losartan (COZAAR) 100 MG tablet Take 1 tablet (100 mg total) by mouth daily. 90 tablet 1  . Melatonin ER 5 MG TBCR Take 1 tablet by mouth at bedtime. 90 tablet 3  . pantoprazole (PROTONIX) 40 MG tablet Take 40 mg by mouth daily.    . Pediatric Multiple Vitamins (MULTIVITAMIN) LIQD Take 5 mLs by mouth daily.    Marland Kitchen venlafaxine XR (EFFEXOR-XR) 150 MG 24 hr capsule TAKE 1 CAPSULE BY MOUTH DAILY WITH BREAKFAST 30 capsule 3  . VENTOLIN HFA 108 (90 Base) MCG/ACT inhaler INHALE TWO PUFFS BY MOUTH EVERY 6 HOURS AS NEEDED FOR WHEEZING OR SHORTNESS OF BREATH 36 each 3   No current facility-administered medications on file  prior to visit.      Past Medical History:  Diagnosis Date  . Arthritis    knees   . Asthma    rare;only when around alot of dust-Ventolin inhaler as needed  . Breast cancer ([place])     left. States she did not have lymph nodes removed  . Bursitis of right shoulder   . Cellulitis and abscess of leg   . Complication of anesthesia    was told 01/06/14 that airway was small  . Depression    takes Effexor and Wellbutrin daily  . Hearing loss   . History of bronchitis 1966  . Hyperlipidemia   . Hypertension    takes Coreg and Losartan daily  . Joint pain   . Joint swelling   . Obese   . Peripheral edema    takes Furosemide daily as needed and Potassium daily  . Personal history of radiation therapy 2015  . Radiation  03/08/14-04/26/14   50.4 gray to left breast. Lumpectomy cavity boosted to 64.4 gray  . Restless legs syndrome    takes depakote  . Sleep apnea, obstructive    uses CPAP  . Wears glasses    No Known Allergies  Social History   Socioeconomic History  . Marital status: Divorced    Spouse name: None  . Number of children: 3  . Years of education: None  . Highest education level: None  Social Needs  . Financial resource strain: None  . Food insecurity - worry: None  . Food insecurity - inability: None  . Transportation needs - medical: None  . Transportation needs - non-medical: None  Occupational History  . 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 Smoking status: Never Smoker  . Smokeless tobacco: Never Used  Substance and Sexual Activity  . Alcohol use: No  . Drug use: No  . Sexual activity: No    Birth control/protection: Post-menopausal  Other Topics Concern  . None  Social History Narrative  . None    Vitals:   10/02/17 1430  BP: 126/74  Pulse: 68  Resp: 12  Temp: 98.3 F (36.8 C)  SpO2: 97%   Body mass index is 35.67 kg/m.   Physical Exam  Nursing note and vitals  reviewed. Constitutional: She is oriented to person, place, and time. She appears well-developed. She does not appear ill. No distress.  HENT:  Head: Normocephalic and atraumatic.  Eyes: Conjunctivae are normal. Pupils are equal, round, and reactive to light.  Cardiovascular:  Pulses:      Dorsalis pedis pulses are 2+ on the right side, and 2+ on the left side.  Respiratory: Effort normal and breath sounds normal. No respiratory distress.  GI: Soft. She exhibits no mass. There is no hepatomegaly. There is no tenderness.  Musculoskeletal: She exhibits no edema.       Lumbar back: She exhibits no tenderness and no bony tenderness.  No tenderness upon palpation of paraspinal muscles. Pain elicited with movement on exam table during examination. No local edema or erythema appreciated, no suspicious lesions. Hip flexion does not elicit pain, flexion mildly limited R>L. Knee crepitus bilateral,no pain elicited.  Lymphadenopathy:    She has no cervical adenopathy.  Neurological: She is alert and oriented to person, place, and time. She has normal strength. Coordination normal.  Symmetric patellar DTR's. SLR negative bilateral. She can walk on heels and tip toes. Stable gait with no assistance.  Skin: Skin is warm. Rash noted. Rash is macular. There is erythema.  Erythematous rash in skin folds on lower abdomen. No tenderness or discharge, area is moist.   Psychiatric: She has a normal mood and affect.  Well groomed, good eye contact.     ASSESSMENT AND PLAN:   Tamara [name] was seen today for radiating back pain down leg.  Diagnoses and all orders for this visit:  Bilateral low back pain with sciatica, sciatica laterality unspecified, unspecified chronicity  Acute exacerbation on chronic problem. We discussed treatment options, she would like Prednisone taper.Side effects discussed. Avoid activities that could aggravate pain. If not back to baseline in 4-6 weeks,lumbar MRI and/or  ortho evaluation may be necessary.  Instructed about warning signs.  -     predniSONE (DELTASONE) 20 MG tablet; 3 tabs for 3 days, 2 tabs for 3 days, 1 tabs for 3 days, and 1/2 tab for 3 days. Take tables together with breakfast.  Generalized osteoarthritis of multiple sites  In general pain is well controlled on current management. Side effects of opioid meds discussed as well as risk of interaction with some of her meds (benzo). Med contract 06/29/17. Urine tox today. Rx x 2 given. F/U in 3 months.  -     HYDROcodone-acetaminophen (NORCO/VICODIN) 5-325 MG tablet; Take 1 tablet by mouth every 12 (twelve) hours as needed for moderate pain.  High risk medication use -     Pain Mgmt, Profile 8 w/Conf, U  Intertriginous candidiasis  Re-current. Keep area dry as possible. She will benefit from surgery to remove extra skin that is aggravating problem. Instructed about warning signs.  Constipation, unspecified constipation type  Stable. No changes in current management. F/U in 12 months.  -     linaclotide (LINZESS) 290 MCG CAPS capsule; Take 1 capsule (290 mcg total) daily before breakfast by mouth.   Return for Keep next appt.   -Tamara [name] was advised to seek immediate medical attention if sudden worsening symptoms.      Betty G. Martinique, MD  [facility]. [place] office.

## 2017-10-04 ENCOUNTER — Encounter: Payer: Self-pay | Admitting: Family Medicine

## 2017-10-04 MED ORDER — LINACLOTIDE 290 MCG PO CAPS: 290.0000 ug | ORAL_CAPSULE | Freq: Every day | ORAL | 2 refills | Status: DC

## 2017-10-07 LAB — PAIN MGMT, PROFILE 8 W/CONF, U
6 Acetylmorphine: NEGATIVE ng/mL (ref <10)
ALCOHOL METABOLITES: NEGATIVE ng/mL (ref <500)
ALPHAHYDROXYALPRAZOLAM: NEGATIVE ng/mL (ref <25)
ALPHAHYDROXYMIDAZOLAM: NEGATIVE ng/mL (ref <50)
AMINOCLONAZEPAM: NEGATIVE ng/mL (ref <25)
Alphahydroxytriazolam: NEGATIVE ng/mL (ref <50)
Amphetamines: NEGATIVE ng/mL (ref <500)
BUPRENORPHINE, URINE: NEGATIVE ng/mL (ref <5)
Benzodiazepines: NEGATIVE ng/mL (ref <100)
COCAINE METABOLITE: NEGATIVE ng/mL (ref <150)
Creatinine: 179.6 mg/dL
HYDROXYETHYLFLURAZEPAM: NEGATIVE ng/mL (ref <50)
LORAZEPAM: NEGATIVE ng/mL (ref <50)
MARIJUANA METABOLITE: NEGATIVE ng/mL (ref <20)
MDMA: NEGATIVE ng/mL (ref <500)
Nordiazepam: NEGATIVE ng/mL (ref <50)
Opiates: NEGATIVE ng/mL (ref <100)
Oxazepam: NEGATIVE ng/mL (ref <50)
Oxidant: NEGATIVE ug/mL (ref <200)
Oxycodone: NEGATIVE ng/mL (ref <100)
Temazepam: NEGATIVE ng/mL (ref <50)
pH: 6.85 (ref 4.5–9.0)

## 2017-10-11 DIAGNOSIS — Z809 Family history of malignant neoplasm, unspecified: Secondary | ICD-10-CM | POA: Diagnosis not present

## 2017-10-11 DIAGNOSIS — Z85038 Personal history of other malignant neoplasm of large intestine: Secondary | ICD-10-CM | POA: Diagnosis not present

## 2017-10-11 DIAGNOSIS — Z800 Family history of malignant neoplasm of digestive organs: Secondary | ICD-10-CM | POA: Diagnosis not present

## 2017-10-11 DIAGNOSIS — Z808 Family history of malignant neoplasm of other organs or systems: Secondary | ICD-10-CM | POA: Diagnosis not present

## 2017-10-11 DIAGNOSIS — R05 Cough: Secondary | ICD-10-CM | POA: Diagnosis not present

## 2017-10-11 DIAGNOSIS — K5900 Constipation, unspecified: Secondary | ICD-10-CM | POA: Diagnosis not present

## 2017-10-11 DIAGNOSIS — R070 Pain in throat: Secondary | ICD-10-CM | POA: Diagnosis not present

## 2017-10-11 DIAGNOSIS — C188 Malignant neoplasm of overlapping sites of colon: Secondary | ICD-10-CM | POA: Diagnosis not present

## 2017-10-11 DIAGNOSIS — M455 Ankylosing spondylitis of thoracolumbar region: Secondary | ICD-10-CM | POA: Diagnosis not present

## 2017-10-19 ENCOUNTER — Ambulatory Visit (INDEPENDENT_AMBULATORY_CARE_PROVIDER_SITE_OTHER): Payer: Medicare Other | Admitting: Pulmonary Disease

## 2017-10-19 ENCOUNTER — Encounter: Payer: Self-pay | Admitting: Pulmonary Disease

## 2017-10-19 VITALS — BP 120/72 | HR 47 | Ht 66.0 in | Wt 221.0 lb

## 2017-10-19 DIAGNOSIS — F5104 Psychophysiologic insomnia: Secondary | ICD-10-CM | POA: Diagnosis not present

## 2017-10-19 DIAGNOSIS — G4733 Obstructive sleep apnea (adult) (pediatric): Secondary | ICD-10-CM | POA: Diagnosis not present

## 2017-10-19 NOTE — Patient Instructions (Signed)
Prescription will be sent to DME for CPAP 9 cm, with humidity.  Continue taking 10 mg of melatonin 1 hour prior to bedtime. Okay to take lorazepam [anxiety medication] at bedtime for sleep for 2-3 weeks

## 2017-10-19 NOTE — Progress Notes (Signed)
Subjective:    Patient ID: Tamara [name], female    DOB: [birth year], 66 y.o.   MRN: [id number]  HPI  Chief Complaint  Patient presents with  . Sleep Consult    Patient states that she needs a new CPAP machine. Patient has had numerous sleep studies. Patient is currently using [facility] as her DME.     66 year old obese woman presents to establish care for management of obstructive sleep apnea.  PSG 03/2013 when she weighed 291 pounds showed severe OSA with AHI 66/hour which was corrected with CPAP of 9 cm.  She underwent gastric sleeve surgery in 2016 and has lost about 70 pounds to her current weight of 220 pounds.   She also had moderate PLM's 40/hour, however PLM arousal index was low at 0.9/hour.  She has an old CPAP machine with full facemask and would like to obtain a new machine. For the last 2 months she also reports non-refreshing sleep and inability to stay asleep.  She used to take Advil PM for many years, neurology asked her to try melatonin instead and she has taken up to 5 tablets of 10 mg melatonin without much effect.  She reports to some sadness of mood and stressors.  She is divorced and her ex-husband of 17 years has come back in her life.  Epworth sleepiness score is 1 when she denies excessive daytime somnolence. Bedtime is between 10 and 11 PM, sleep latency is variable and sometimes can be a few hours, she sleeps on the left side with 2 pillows, reports several nocturnal awakenings and is out of bed by 9 AM feeling tired with occasional dryness of mouth.  There is no history suggestive of cataplexy, sleep paralysis or parasomnias  She has chronic back pain for which she takes hydrocodone. She takes lorazepam twice daily for anxiety. She is retired and now lives alone with her cat    Past Medical History:  Diagnosis Date  . Arthritis    knees   . Asthma    rare;only when around alot of dust-Ventolin inhaler as needed  . Breast cancer ([place])     left. States  she did not have lymph nodes removed  . Bursitis of right shoulder   . Cellulitis and abscess of leg   . Complication of anesthesia    was told 01/06/14 that airway was small  . Depression    takes Effexor and Wellbutrin daily  . Hearing loss   . History of bronchitis 1966  . Hyperlipidemia   . Hypertension    takes Coreg and Losartan daily  . Joint pain   . Joint swelling   . Obese   . Peripheral edema    takes Furosemide daily as needed and Potassium daily  . Personal history of radiation therapy 2015  . Radiation 03/08/14-04/26/14   50.4 gray to left breast. Lumpectomy cavity boosted to 64.4 gray  . Restless legs syndrome    takes depakote  . Sleep apnea, obstructive    uses CPAP  . Wears glasses     Past Surgical History:  Procedure Laterality Date  . BREAST LUMPECTOMY Left 2015  . BREAST LUMPECTOMY WITH NEEDLE LOCALIZATION Left 01/24/2014   Performed by Excell Seltzer, MD at [place]    . COLONOSCOPY N/A 03/20/2017   Performed by Mauri Pole, MD at [place]  . DILATATION & CURETTAGE/HYSTEROSCOPY WITH TRUCLEAR N/A 01/06/2014   Performed by Allena Katz, MD at [facility]  .  HYSTEROPLASTY  01/2014  . INNER EAR SURGERY Bilateral    for hearing loss  . JOINT REPLACEMENT Right    Knee  . LAPAROSCOPIC GASTRIC SLEEVE RESECTION WITH UPPER ENDO N/A 01/28/2016   Performed by Excell Seltzer, MD at [facility] ORS  . RE-EXCISION OF LEFT BREAST LUMPECTOMY Left 02/02/2014   Performed by Excell Seltzer, MD at [place]  . TUBAL LIGATION      No Known Allergies  Social History   Socioeconomic History  . Marital status: Divorced    Spouse name: Not on file  . Number of children: 3  . Years of education: Not on file  . Highest education level: Not on file  Social Needs  . Financial resource strain: Not on file  . Food insecurity - worry: Not on file  . Food insecurity - inability: Not on file  . Transportation needs - medical: Not on file  .  Transportation needs - non-medical: Not on file  Occupational History  . 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 Smoking status: Never Smoker  . Smokeless tobacco: Never Used  Substance and Sexual Activity  . Alcohol use: No  . Drug use: No  . Sexual activity: No    Birth control/protection: Post-menopausal  Other Topics Concern  . Not on file  Social History Narrative  . Not on file       Family History  Problem Relation Age of Onset  . Dementia Mother   . Heart attack Mother   . Esophageal cancer Father        also had stomach cancer  . Heart attack Father   . Asthma Other   . Hypertension Other   . Thyroid disease Other   . Heart attack Other   . Throat cancer Paternal Grandfather      Review of Systems  Positive for shortness of breath with activity, [place] heartburn and indigestion, weight loss of 70 pounds, headaches, anxiety and joint stiffness  Constitutional: negative for anorexia, fevers and sweats  Eyes: negative for irritation, redness and visual disturbance  Ears, nose, mouth, throat, and face: negative for earaches, epistaxis, nasal congestion and sore throat  Respiratory: negative for cough, dyspnea on exertion, sputum and wheezing  Cardiovascular: negative for chest pain, dyspnea, lower extremity edema, orthopnea, palpitations and syncope  Gastrointestinal: negative for abdominal pain, constipation, diarrhea, melena, nausea and vomiting  Genitourinary:negative for dysuria, frequency and hematuria  Hematologic/lymphatic: negative for bleeding, easy bruising and lymphadenopathy  Musculoskeletal:negative for arthralgias, muscle weakness and stiff joints  Neurological: negative for coordination problems, gait problems, headaches and weakness  Endocrine: negative for diabetic symptoms including polydipsia, polyuria and weight loss      Objective:   Physical Exam  Gen. Pleasant, obese, in no distress, normal  affect ENT - no lesions, no post nasal drip, class 2-3 airway Neck: No JVD, no thyromegaly, no carotid bruits Lungs: no use of accessory muscles, no dullness to percussion, decreased without rales or rhonchi  Cardiovascular: Rhythm regular, heart sounds  normal, no murmurs or gallops, no peripheral edema Abdomen: soft and non-tender, no hepatosplenomegaly, BS normal. Musculoskeletal: No deformities, no cyanosis or clubbing Neuro:  alert, non focal, no tremors       Assessment & Plan:

## 2017-10-19 NOTE — Assessment & Plan Note (Addendum)
Continue taking 10 mg of melatonin 1 hour prior to bedtime. Okay to take lorazepam [anxiety medication] at bedtime for sleep for 2-3 weeks -emphasized that this is not a long-term option  May be related to other stressors discussed in the H&P

## 2017-10-19 NOTE — Assessment & Plan Note (Signed)
Prescription will be sent to DME for new CPAP 9 cm and download will be checked in 4 weeks and if necessary settings will be adjusted He remains to be seen whether she will completely come off her CPAP machine with some more weight loss  Weight loss encouraged, compliance with goal of at least 4-6 hrs every night is the expectation. Advised against medications with sedative side effects Cautioned against driving when sleepy - understanding that sleepiness will vary on a day to day basis

## 2017-12-01 NOTE — Progress Notes (Signed)
HPI:   Tamara [name] is a 67 y.o. female, who is here today for 5 months follow up.   She was last seen on 10/02/17 for acute visit.  Since her last OV she has followed with pulmonologist, Hx of OSA.  Generalized OA and chronic back pain: Shoulders,hips,knees mainly. Sometimes severe, depending on physical activity.  She is on Hydrocodone-Acetaminophen 5-325 mg bid as needed, she takes it at bedtime usually. Tolerating medication well, still helping with pain. Pain is exacerbated by prolonged standing and walking. Alleviated by rest.   She is still having anterior left hip pain, worse in the morning when she first gets up (10/10). No alleviating factors identified but gradually improves after moving around the house. Pain is not radiated and no limitation of daily activities.  Denies abdominal pain, nausea, vomiting, changes in bowel habits, or urinary symptoms.   Anxiety and depression:  On Wellbutrin SR 150 mg bid and Effexor XR 159 mg daily. She takes Lorazepam 1 mg bid as needed for anxiety.  For the past months "it has been worse", "thinks in my mind." In general she feels like problem is stable and medication is still helping.  Denies suicidal thoughts.  Hypertension:   Chronic problem. Currently on Cozaar 100 mg daily ,Amlodipjne 2.5 mg, and Coreg 12.5 mg bid. No side effects reported. Ho,e BP "sometimes", usually < 140/90.  She has not noted unusual headache, visual changes, exertional chest pain, dyspnea, focal weakness, or edema.   Lab Results  Component Value Date   CREATININE 0.84 02/17/2017   BUN 26 (H) 02/17/2017   NA 140 02/17/2017   K 4.4 02/17/2017   CL 105 02/17/2017   CO2 30 02/17/2017   She joined the team, planning on starting exercising tomorrow.  HLD: She is on non pharmacologic treatment. 08/2014 TC 176,TG 207,LDL 100.3,anf HDL 43. She follows low fat diet.  Review of Systems  Constitutional: Negative for activity  change, appetite change, fatigue and fever.  HENT: Negative for mouth sores, nosebleeds, sore throat and trouble swallowing.   Eyes: Negative for redness and visual disturbance.  Respiratory: Negative for cough, shortness of breath and wheezing.   Cardiovascular: Negative for chest pain, palpitations and leg swelling.  Gastrointestinal: Negative for abdominal pain, nausea and vomiting.       Negative for changes in bowel habits.  Endocrine: Negative for cold intolerance and heat intolerance.  Genitourinary: Negative for decreased urine volume and hematuria.  Musculoskeletal: Positive for arthralgias and back pain. Negative for gait problem.  Skin: Positive for rash (Stable,lower abdomen,Hx of intertrigo). Negative for wound.  Neurological: Negative for syncope, weakness and headaches.  Psychiatric/Behavioral: Positive for sleep disturbance. Negative for confusion. The patient is nervous/anxious.       Current Outpatient Medications on File Prior to Visit  Medication Sig Dispense Refill  . amLODipine (NORVASC) 2.5 MG tablet Take 1 tablet (2.5 mg total) by mouth daily. 90 tablet 2  . anastrozole (ARIMIDEX) 1 MG tablet Take 1 tablet (1 mg total) by mouth daily. 90 tablet 3  . aspirin EC 81 MG tablet Take 81 mg by mouth at bedtime.    Marland Kitchen buPROPion (WELLBUTRIN SR) 150 MG 12 hr tablet Take 1 tablet (150 mg total) by mouth 2 (two) times daily. 60 tablet 4  . Calcium Carbonate-Vitamin D (CALCIUM PLUS VITAMIN D PO) Take 1 tablet by mouth daily.    . carvedilol (COREG) 12.5 MG tablet Take 1 tablet (12.5 mg total) by mouth  2 (two) times daily with a meal. 60 tablet 3  . clindamycin (CLINDAGEL) 1 % gel Apply topically 2 (two) times daily. 30 g 0  . cloNIDine (CATAPRES - DOSED IN MG/24 HR) 0.1 mg/24hr patch Place 1 patch (0.1 mg total) onto the skin once a week. APPLY ONE PATCH TOPICALLY ONCE A WEEK USE FOR HEADACHES (Patient taking differently: Place 0.1 mg onto the skin every Tuesday. ) 4 patch 0  .  cloNIDine (CATAPRES - DOSED IN MG/24 HR) 0.1 mg/24hr patch APPLY 1 PATCH TOPICALLY ONCE A WEEK, USE FOR HEADACHES 4 patch 0  . Cyanocobalamin (VITAMIN B-12) 1000 MCG/15ML LIQD Take 5 mLs by mouth daily.    . diphenhydramine-acetaminophen (TYLENOL PM) 25-500 MG TABS Take 2 tablets by mouth at bedtime as needed (for sleep).     . flintstones complete (FLINTSTONES) 60 MG chewable tablet Chew 1 tablet by mouth daily.    . fluconazole (DIFLUCAN) 150 MG tablet TAKE ONE TABLET BY MOUTH EVERY OTHER DAY ONLY  AS  NEEDED  FOR  YEAST 17 tablet 17  . fluticasone (CUTIVATE) 0.05 % cream Apply 1 application topically as needed (rash). 30 g 1  . furosemide (LASIX) 40 MG tablet Take 0.5 tablets (20 mg total) by mouth daily as needed for edema. 90 tablet 0  . HYDROcodone-acetaminophen (NORCO/VICODIN) 5-325 MG tablet Take 1 tablet by mouth every 12 (twelve) hours as needed for moderate pain. 40 tablet 0  . ketoconazole (NIZORAL) 2 % cream Apply 1 application topically daily as needed (rash). 15 g 4  . linaclotide (LINZESS) 290 MCG CAPS capsule Take 1 capsule (290 mcg total) daily before breakfast by mouth. 90 capsule 2  . LORazepam (ATIVAN) 1 MG tablet TAKE ONE TABLET BY MOUTH TWICE DAILY AS NEEDED (Patient taking differently: Take 1 tablet by mouth twice daily as needed for anxiety) 60 tablet 0  . losartan (COZAAR) 100 MG tablet Take 1 tablet (100 mg total) by mouth daily. 90 tablet 1  . Melatonin ER 5 MG TBCR Take 1 tablet by mouth at bedtime. 90 tablet 3  . pantoprazole (PROTONIX) 40 MG tablet Take 40 mg by mouth daily.    . Pediatric Multiple Vitamins (MULTIVITAMIN) LIQD Take 5 mLs by mouth daily.    . predniSONE (DELTASONE) 20 MG tablet 3 tabs for 3 days, 2 tabs for 3 days, 1 tabs for 3 days, and 1/2 tab for 3 days. Take tables together with breakfast. 20 tablet 0  . venlafaxine XR (EFFEXOR-XR) 150 MG 24 hr capsule TAKE 1 CAPSULE BY MOUTH DAILY WITH BREAKFAST 30 capsule 3  . VENTOLIN HFA 108 (90 Base) MCG/ACT  inhaler INHALE TWO PUFFS BY MOUTH EVERY 6 HOURS AS NEEDED FOR WHEEZING OR SHORTNESS OF BREATH 36 each 3   No current facility-administered medications on file prior to visit.      Past Medical History:  Diagnosis Date  . Arthritis    knees   . Asthma    rare;only when around alot of dust-Ventolin inhaler as needed  . Breast cancer ([place])     left. States she did not have lymph nodes removed  . Bursitis of right shoulder   . Cellulitis and abscess of leg   . Complication of anesthesia    was told 01/06/14 that airway was small  . Depression    takes Effexor and Wellbutrin daily  . Hearing loss   . History of bronchitis 1966  . Hyperlipidemia   . Hypertension    takes Coreg and  Losartan daily  . Joint pain   . Joint swelling   . Obese   . Peripheral edema    takes Furosemide daily as needed and Potassium daily  . Personal history of radiation therapy 2015  . Radiation 03/08/14-04/26/14   50.4 gray to left breast. Lumpectomy cavity boosted to 64.4 gray  . Restless legs syndrome    takes depakote  . Sleep apnea, obstructive    uses CPAP  . Wears glasses    No Known Allergies  Social History   Socioeconomic History  . Marital status: Divorced    Spouse name: None  . Number of children: 3  . Years of education: None  . Highest education level: None  Social Needs  . Financial resource strain: None  . Food insecurity - worry: None  . Food insecurity - inability: None  . Transportation needs - medical: None  . Transportation needs - non-medical: None  Occupational History  . 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 Smoking status: Never Smoker  . Smokeless tobacco: Never Used  Substance and Sexual Activity  . Alcohol use: No  . Drug use: No  . Sexual activity: No    Birth control/protection: Post-menopausal  Other Topics Concern  . None  Social History Narrative  . None    Vitals:   12/02/17 0818  BP: 124/78    Pulse: 63  Resp: 16  Temp: 97.8 F (36.6 C)  SpO2: 98%   Body mass index is 36.68 kg/m.   Physical Exam  Nursing note and vitals reviewed. Constitutional: She is oriented to person, place, and time. She appears well-developed. No distress.  HENT:  Head: Normocephalic and atraumatic.  Mouth/Throat: Oropharynx is clear and moist. Mucous membranes are dry.  Eyes: Conjunctivae are normal. Pupils are equal, round, and reactive to light.  Cardiovascular: Normal rate and regular rhythm.  No murmur heard. Pulses:      Dorsalis pedis pulses are 2+ on the right side, and 2+ on the left side.  Respiratory: Effort normal and breath sounds normal. No respiratory distress.  GI: Soft. She exhibits no mass. There is no hepatomegaly. There is no tenderness.  Musculoskeletal: She exhibits edema (Trace pitting LE edema,bilateral). She exhibits no tenderness.       Lumbar back: She exhibits no tenderness and no bony tenderness.  Left hip no tenderness with ROM. Knee crepitus.  Lymphadenopathy:    She has no cervical adenopathy.  Neurological: She is alert and oriented to person, place, and time. She has normal strength.  Stable gait with no assistance needed.  Skin: Skin is warm. No erythema.  Psychiatric: She has a normal mood and affect.  Well groomed, good eye contact.    ASSESSMENT AND PLAN:   Tamara [name] was seen today for 5 months follow-up.   Diagnoses and all orders for this visit:  Lab Results  Component Value Date   CHOL 253 (H) 12/02/2017   HDL 50.20 12/02/2017   LDLCALC 153 (H) 08/02/2011   LDLDIRECT 175.0 12/02/2017   TRIG 203.0 (H) 12/02/2017   CHOLHDL 5 12/02/2017   Lab Results  Component Value Date   CREATININE 0.85 12/02/2017   BUN 27 (H) 12/02/2017   NA 141 12/02/2017   K 5.0 12/02/2017   CL 104 12/02/2017   CO2 29 12/02/2017    Generalized osteoarthritis of multiple sites  Overall pain is well controlled. No changes in current management,  side effects of opioid medications discussed as well as risk of interaction with benzo. Med contract 05/2017. F/U in 3-4 months.  Essential hypertension  Adequately controlled. No changes in current management. DASH -low salt diet recommended. Eye exam recommended annually. F/U in 6 months, before if needed.  -     Basic metabolic panel  Anxiety and depression   Overall stable, mild exacerbation during this time but in general she does not feel like medications need to be adjusted. Educated about disease and instructed about warning sings. F/U in 3-4 months. `  Hyperlipidemia, acquired  No changes in current management, will follow labs done today and will give further recommendations accordingly. F/U in 6-12 months.  -     Lipid panel  The 10-year ASCVD risk score Mikey Bussing DC Jr., et al., 2013) is: 9.8%   Values used to calculate the score:     Age: 64 years     Sex: Female     Is Non-Hispanic African American: No     Diabetic: No     Tobacco smoker: No     Systolic Blood Pressure: 623 mmHg     Is BP treated: Yes     HDL Cholesterol: 50.2 mg/dL     Total Cholesterol: 253 mg/dL   -Ms. ALIESE BRANNUM was advised to return sooner than planned today if new concerns arise.       Anastazja Isaac G. [place], MD  [facility]. [place] office.

## 2017-12-02 ENCOUNTER — Encounter: Payer: Self-pay | Admitting: Family Medicine

## 2017-12-02 ENCOUNTER — Ambulatory Visit (INDEPENDENT_AMBULATORY_CARE_PROVIDER_SITE_OTHER): Payer: Medicare Other | Admitting: Family Medicine

## 2017-12-02 VITALS — BP 124/78 | HR 63 | Temp 97.8°F | Resp 16 | Ht 66.0 in | Wt 227.2 lb

## 2017-12-02 DIAGNOSIS — I10 Essential (primary) hypertension: Secondary | ICD-10-CM

## 2017-12-02 DIAGNOSIS — F329 Major depressive disorder, single episode, unspecified: Secondary | ICD-10-CM

## 2017-12-02 DIAGNOSIS — E785 Hyperlipidemia, unspecified: Secondary | ICD-10-CM | POA: Diagnosis not present

## 2017-12-02 DIAGNOSIS — M159 Polyosteoarthritis, unspecified: Secondary | ICD-10-CM | POA: Diagnosis not present

## 2017-12-02 DIAGNOSIS — F419 Anxiety disorder, unspecified: Secondary | ICD-10-CM | POA: Diagnosis not present

## 2017-12-02 LAB — LDL CHOLESTEROL, DIRECT: Direct LDL: 175 mg/dL

## 2017-12-02 LAB — LIPID PANEL
CHOLESTEROL: 253 mg/dL — AB (ref 0–200)
HDL: 50.2 mg/dL (ref >39.00)
NonHDL: 202.92
TRIGLYCERIDES: 203 mg/dL — AB (ref 0.0–149.0)
Total CHOL/HDL Ratio: 5
VLDL: 40.6 mg/dL — ABNORMAL HIGH (ref 0.0–40.0)

## 2017-12-02 LAB — BASIC METABOLIC PANEL
BUN: 27 mg/dL — ABNORMAL HIGH (ref 6–23)
CALCIUM: 9.3 mg/dL (ref 8.4–10.5)
CHLORIDE: 104 meq/L (ref 96–112)
CO2: 29 mEq/L (ref 19–32)
CREATININE: 0.85 mg/dL (ref 0.40–1.20)
GFR: 70.76 mL/min (ref >60.00)
Glucose, Bld: 83 mg/dL (ref 70–99)
Potassium: 5 mEq/L (ref 3.5–5.1)
Sodium: 141 mEq/L (ref 135–145)

## 2017-12-02 NOTE — Patient Instructions (Signed)
A few things to remember from today's visit:   Generalized osteoarthritis of multiple sites  Essential hypertension  Anxiety and depression  No changes today. Please call me to 3 days in advance when you need refills on medications. Exercise will help with some of your chronic medical problems. I will see her back in 3-4 months.  Please be sure medication list is accurate. If a new problem present, please set up appointment sooner than planned today.

## 2017-12-05 ENCOUNTER — Encounter: Payer: Self-pay | Admitting: Family Medicine

## 2017-12-07 MED ORDER — ATORVASTATIN CALCIUM 20 MG PO TABS: 20.0000 mg | ORAL_TABLET | Freq: Every day | ORAL | 1 refills | Status: DC

## 2017-12-14 DIAGNOSIS — M545 Low back pain: Secondary | ICD-10-CM | POA: Diagnosis not present

## 2017-12-14 DIAGNOSIS — M5417 Radiculopathy, lumbosacral region: Secondary | ICD-10-CM | POA: Diagnosis not present

## 2017-12-14 DIAGNOSIS — M48061 Spinal stenosis, lumbar region without neurogenic claudication: Secondary | ICD-10-CM | POA: Diagnosis not present

## 2017-12-14 DIAGNOSIS — M5136 Other intervertebral disc degeneration, lumbar region: Secondary | ICD-10-CM | POA: Diagnosis not present

## 2017-12-19 DIAGNOSIS — M545 Low back pain: Secondary | ICD-10-CM | POA: Diagnosis not present

## 2017-12-25 DIAGNOSIS — M5136 Other intervertebral disc degeneration, lumbar region: Secondary | ICD-10-CM | POA: Diagnosis not present

## 2017-12-25 DIAGNOSIS — M48061 Spinal stenosis, lumbar region without neurogenic claudication: Secondary | ICD-10-CM | POA: Insufficient documentation

## 2018-01-08 ENCOUNTER — Other Ambulatory Visit: Payer: Self-pay | Admitting: Family Medicine

## 2018-01-08 ENCOUNTER — Other Ambulatory Visit: Payer: Self-pay | Admitting: Oncology

## 2018-01-08 DIAGNOSIS — M159 Polyosteoarthritis, unspecified: Secondary | ICD-10-CM

## 2018-01-08 MED ORDER — CLINDAMYCIN PHOSPHATE 1 % EX GEL: Freq: Two times a day (BID) | CUTANEOUS | 0 refills | Status: DC

## 2018-01-13 DIAGNOSIS — E279 Disorder of adrenal gland, unspecified: Secondary | ICD-10-CM | POA: Diagnosis not present

## 2018-01-13 MED ORDER — HYDROCODONE-ACETAMINOPHEN 5-325 MG PO TABS: 1.0000 | ORAL_TABLET | Freq: Two times a day (BID) | ORAL | 0 refills | Status: DC | PRN

## 2018-01-18 ENCOUNTER — Other Ambulatory Visit: Payer: Self-pay | Admitting: Family Medicine

## 2018-01-18 DIAGNOSIS — E279 Disorder of adrenal gland, unspecified: Secondary | ICD-10-CM | POA: Diagnosis not present

## 2018-01-18 DIAGNOSIS — I10 Essential (primary) hypertension: Secondary | ICD-10-CM

## 2018-01-19 ENCOUNTER — Other Ambulatory Visit: Payer: Self-pay | Admitting: Family Medicine

## 2018-01-19 DIAGNOSIS — I10 Essential (primary) hypertension: Secondary | ICD-10-CM

## 2018-01-19 MED ORDER — CARVEDILOL 12.5 MG PO TABS: 12.5000 mg | ORAL_TABLET | Freq: Two times a day (BID) | ORAL | 2 refills | Status: DC

## 2018-01-19 NOTE — Telephone Encounter (Signed)
Dr Martinique pt, please Advise

## 2018-01-21 ENCOUNTER — Other Ambulatory Visit: Payer: Self-pay | Admitting: *Deleted

## 2018-01-21 MED ORDER — FLUCONAZOLE 150 MG PO TABS: ORAL_TABLET | ORAL | 1 refills | Status: DC

## 2018-01-22 DIAGNOSIS — K7689 Other specified diseases of liver: Secondary | ICD-10-CM | POA: Diagnosis not present

## 2018-01-22 DIAGNOSIS — E279 Disorder of adrenal gland, unspecified: Secondary | ICD-10-CM | POA: Diagnosis not present

## 2018-01-25 DIAGNOSIS — E279 Disorder of adrenal gland, unspecified: Secondary | ICD-10-CM | POA: Diagnosis not present

## 2018-02-25 ENCOUNTER — Ambulatory Visit: Payer: Self-pay | Admitting: Oncology

## 2018-03-01 ENCOUNTER — Inpatient Hospital Stay: Payer: Medicare Other | Attending: Oncology | Admitting: Oncology

## 2018-03-01 ENCOUNTER — Telehealth: Payer: Self-pay

## 2018-03-01 ENCOUNTER — Telehealth: Payer: Self-pay | Admitting: Oncology

## 2018-03-01 VITALS — BP 161/93 | HR 57 | Temp 98.2°F | Resp 18 | Ht 66.0 in | Wt 225.0 lb

## 2018-03-01 DIAGNOSIS — Z79811 Long term (current) use of aromatase inhibitors: Secondary | ICD-10-CM | POA: Diagnosis not present

## 2018-03-01 DIAGNOSIS — Z170 Estrogen receptor positive status [ER+]: Secondary | ICD-10-CM | POA: Insufficient documentation

## 2018-03-01 DIAGNOSIS — F418 Other specified anxiety disorders: Secondary | ICD-10-CM

## 2018-03-01 DIAGNOSIS — Z79899 Other long term (current) drug therapy: Secondary | ICD-10-CM | POA: Insufficient documentation

## 2018-03-01 DIAGNOSIS — E785 Hyperlipidemia, unspecified: Secondary | ICD-10-CM | POA: Insufficient documentation

## 2018-03-01 DIAGNOSIS — Z9989 Dependence on other enabling machines and devices: Secondary | ICD-10-CM | POA: Diagnosis not present

## 2018-03-01 DIAGNOSIS — Z7982 Long term (current) use of aspirin: Secondary | ICD-10-CM | POA: Diagnosis not present

## 2018-03-01 DIAGNOSIS — G4733 Obstructive sleep apnea (adult) (pediatric): Secondary | ICD-10-CM | POA: Diagnosis not present

## 2018-03-01 DIAGNOSIS — D0512 Intraductal carcinoma in situ of left breast: Secondary | ICD-10-CM

## 2018-03-01 DIAGNOSIS — Z7952 Long term (current) use of systemic steroids: Secondary | ICD-10-CM | POA: Diagnosis not present

## 2018-03-01 DIAGNOSIS — D3501 Benign neoplasm of right adrenal gland: Secondary | ICD-10-CM | POA: Diagnosis not present

## 2018-03-01 DIAGNOSIS — J45909 Unspecified asthma, uncomplicated: Secondary | ICD-10-CM | POA: Diagnosis not present

## 2018-03-01 DIAGNOSIS — M1990 Unspecified osteoarthritis, unspecified site: Secondary | ICD-10-CM | POA: Diagnosis not present

## 2018-03-01 DIAGNOSIS — I10 Essential (primary) hypertension: Secondary | ICD-10-CM | POA: Insufficient documentation

## 2018-03-01 DIAGNOSIS — C50412 Malignant neoplasm of upper-outer quadrant of left female breast: Secondary | ICD-10-CM

## 2018-03-01 MED ORDER — ANASTROZOLE 1 MG PO TABS: 1.0000 mg | ORAL_TABLET | Freq: Every day | ORAL | 3 refills | Status: DC

## 2018-03-01 NOTE — Telephone Encounter (Signed)
Gave avs and calendar ° °

## 2018-03-01 NOTE — Progress Notes (Signed)
HPI:   Tamara [name] is a 67 y.o. female, who is here today for 3 months follow up on chronic pain, depression, and anxiety  She was last seen on 12/02/2017.  Since her last OV she has seen her oncologist (left breast cancer) and orthopedist for spinal stenosis. According to patient, she was evaluated recently by urologist.  She was referred by Dr. Tonita Cong because incidental findings on lumbar MRI. On 12/19/2017 she had lumbar MRI that showed large right adrenal gland mass seen measuring 3.8 x 2.3 cm. According to patient, she was reassured in regard to malignancy and no further studies or urology follow-up was recommended. She states that she discussed MRI  findings with her oncologist and according to patient, Dr Jana Hakim is waiting for records from urologist's office. She has copies of recent labs done by urologist: Epinephrine, norepinephrine, dopamine, and vanillylmandelic acid in 17-OHYW urine all in normal range.  Generalized OA and chronic back pain, currently she is on hydrocodone-acetaminophen 5-325 mg twice daily as needed. Pain is exacerbated by prolonged standing and walking, alleviated by rest. Pain is 9/10 in the morning, at night depending of how she sleeps. Morning stiffness. Pain is mainly localized in shoulders, mid/lower back, hips, and knees.  In general she is able to function around the house and work on her daily chores.    Anxiety and depression: Currently she is on Wellbutrin SR 150 mg twice daily and Effexor XR 150 mg daily. She is also on Lorazepam 1 mg twice daily as needed for anxiety.  No depressed mood or suicidal thoughts. She is not reporting side effects from these medications.   Constipation  She does not feel like Linzess is working. She is having bowel movements about every 2 days. She is taking OTC fiber supplementation. No abnormal weight loss, abdominal pain, or blood in the stool.  GERD: For the past month or so she has been  having heartburn, nausea, and acid reflux.  She has tried OTC medications. On her med list is Protonix, which has not been filled in a while. Certain food exacerbates problem as well as coffee.  She is also requesting refill for albuterol, which she uses as needed for asthma symptoms. Symptoms are exacerbated by seasonal changes and dust.  Lower extremity edema: She is currently on furosemide 40 mg half tablet daily as needed. No erythema or ulcers on lower extremities.  Review of Systems  Constitutional: Positive for fatigue. Negative for appetite change, fever and unexpected weight change.  HENT: Negative for mouth sores, sore throat, trouble swallowing and voice change.   Respiratory: Negative for cough, shortness of breath and wheezing.   Cardiovascular: Positive for leg swelling. Negative for chest pain and palpitations.  Gastrointestinal: Positive for constipation and nausea. Negative for abdominal pain and vomiting.  Genitourinary: Negative for decreased urine volume, dysuria and hematuria.  Musculoskeletal: Positive for arthralgias and back pain. Negative for joint swelling.  Skin: Negative for rash and wound.  Allergic/Immunologic: Positive for environmental allergies.  Neurological: Negative for syncope, weakness and headaches.  Psychiatric/Behavioral: Positive for sleep disturbance. Negative for confusion. The patient is nervous/anxious.     Current Outpatient Medications on File Prior to Visit  Medication Sig Dispense Refill  . amLODipine (NORVASC) 2.5 MG tablet Take 1 tablet (2.5 mg total) by mouth daily. 90 tablet 2  . anastrozole (ARIMIDEX) 1 MG tablet Take 1 tablet (1 mg total) by mouth daily. 90 tablet 3  . aspirin EC 81 MG  tablet Take 81 mg by mouth at bedtime.    Marland Kitchen atorvastatin (LIPITOR) 20 MG tablet Take 1 tablet (20 mg total) by mouth daily at 6 PM. 90 tablet 1  . Calcium Carbonate-Vitamin D (CALCIUM PLUS VITAMIN D PO) Take 1 tablet by mouth daily.    . carvedilol  (COREG) 12.5 MG tablet Take 1 tablet (12.5 mg total) by mouth 2 (two) times daily with a meal. 180 tablet 2  . clindamycin (CLINDAGEL) 1 % gel Apply topically 2 (two) times daily. 30 g 0  . Cyanocobalamin (VITAMIN B-12) 1000 MCG/15ML LIQD Take 5 mLs by mouth daily.    . diphenhydramine-acetaminophen (TYLENOL PM) 25-500 MG TABS Take 2 tablets by mouth at bedtime as needed (for sleep).     . flintstones complete (FLINTSTONES) 60 MG chewable tablet Chew 1 tablet by mouth daily.    . fluconazole (DIFLUCAN) 150 MG tablet Take one tablet by mount daily for 3 days may repeat if symptoms recur 30 tablet 1  . fluticasone (CUTIVATE) 0.05 % cream Apply 1 application topically as needed (rash). 30 g 1  . HYDROcodone-acetaminophen (NORCO/VICODIN) 5-325 MG tablet Take 1 tablet by mouth every 12 (twelve) hours as needed for moderate pain. 40 tablet 0  . ketoconazole (NIZORAL) 2 % cream Apply 1 application topically daily as needed (rash). 15 g 4  . linaclotide (LINZESS) 290 MCG CAPS capsule Take 1 capsule (290 mcg total) daily before breakfast by mouth. 90 capsule 2  . LORazepam (ATIVAN) 1 MG tablet TAKE ONE TABLET BY MOUTH TWICE DAILY AS NEEDED (Patient taking differently: Take 1 tablet by mouth twice daily as needed for anxiety) 60 tablet 0  . losartan (COZAAR) 100 MG tablet Take 1 tablet (100 mg total) by mouth daily. 90 tablet 1  . Melatonin ER 5 MG TBCR Take 1 tablet by mouth at bedtime. 90 tablet 3  . nystatin cream (MYCOSTATIN) nystatin 100,000 unit/gram topical cream    . Pediatric Multiple Vitamins (MULTIVITAMIN) LIQD Take 5 mLs by mouth daily.    Marland Kitchen venlafaxine XR (EFFEXOR-XR) 150 MG 24 hr capsule TAKE 1 CAPSULE BY MOUTH DAILY WITH BREAKFAST 30 capsule 3  . cloNIDine (CATAPRES - DOSED IN MG/24 HR) 0.1 mg/24hr patch clonidine 0.1 mg/24 hr weekly transdermal patch     No current facility-administered medications on file prior to visit.      Past Medical History:  Diagnosis Date  . Arthritis    knees    . Asthma    rare;only when around alot of dust-Ventolin inhaler as needed  . Breast cancer ([place])     left. States she did not have lymph nodes removed  . Bursitis of right shoulder   . Cellulitis and abscess of leg   . Complication of anesthesia    was told 01/06/14 that airway was small  . Depression    takes Effexor and Wellbutrin daily  . Hearing loss   . History of bronchitis 1966  . Hyperlipidemia   . Hypertension    takes Coreg and Losartan daily  . Joint pain   . Joint swelling   . Obese   . Peripheral edema    takes Furosemide daily as needed and Potassium daily  . Personal history of radiation therapy 2015  . Radiation 03/08/14-04/26/14   50.4 gray to left breast. Lumpectomy cavity boosted to 64.4 gray  . Restless legs syndrome    takes depakote  . Sleep apnea, obstructive    uses CPAP  . Wears glasses  No Known Allergies  Social History   Socioeconomic History  . Marital status: Divorced    Spouse name: Not on file  . Number of children: 3  . Years of education: Not on file  . Highest education level: Not on file  Occupational History  . 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 Financial resource strain: Not on file  . Food insecurity:    Worry: Not on file    Inability: Not on file  . Transportation needs:    Medical: Not on file    Non-medical: Not on file  Tobacco Use  . Smoking status: Never Smoker  . Smokeless tobacco: Never Used  Substance and Sexual Activity  . Alcohol use: No  . Drug use: No  . Sexual activity: Never    Birth control/protection: Post-menopausal  Lifestyle  . Physical activity:    Days per week: Not on file    Minutes per session: Not on file  . Stress: Not on file  Relationships  . Social connections:    Talks on phone: Not on file    Gets together: Not on file    Attends religious service: Not on file    Active member of club or organization: Not on file    Attends  meetings of clubs or organizations: Not on file    Relationship status: Not on file  Other Topics Concern  . Not on file  Social History Narrative  . Not on file    Vitals:   03/02/18 1108  BP: 132/80  Pulse: 65  Temp: 98.5 F (36.9 C)  SpO2: 94%   Body mass index is 36.07 kg/m.   Physical Exam  Nursing note and vitals reviewed. Constitutional: She is oriented to person, place, and time. She appears well-developed. No distress.  HENT:  Head: Normocephalic and atraumatic.  Mouth/Throat: Oropharynx is clear and moist and mucous membranes are normal.  Eyes: Conjunctivae are normal.  Cardiovascular: Normal rate and regular rhythm.  No murmur heard. Pulses:      Posterior tibial pulses are 2+ on the right side, and 2+ on the left side.  Respiratory: Effort normal and breath sounds normal. No respiratory distress.  GI: Soft. She exhibits no mass. There is no tenderness.  Musculoskeletal: She exhibits edema (1+ pitting LE edema,bilateral.).       Thoracic back: She exhibits no tenderness and no bony tenderness.       Lumbar back: She exhibits no tenderness and no bony tenderness.  Shoulders with adequate ROM, no pain elicited. Left knee crepitus with movement. Some IP joints PIP nodules. No signs of synovitis.  Neurological: She is alert and oriented to person, place, and time. She has normal strength. Coordination normal.  Skin: Skin is warm. No erythema.  Psychiatric: Her mood appears anxious.  Well groomed, good eye contact.       ASSESSMENT AND PLAN:   Ms. IYAH LAGUNA was seen today for 3 months follow-up.  No orders of the defined types were placed in this encounter.   Anxiety and depression Stable overall. No changes in current management: Wellbutrin SR 150 mg twice daily and Effexor XR 150 mg daily. She will continue Lorazepam 1 mg twice daily as needed. Follow-up in 3 months, before if needed.  Generalized osteoarthritis of multiple sites We  discussed some side effects of opiate medications, including constipation. Pain is stable. No changes in current management. Medical contract in 05/2017.  Constipation Seems to be persistent on Linzess is no longer helping. Linzess to be discontinue. For now she will continue with OTC MiraLAX daily and Dulcolax 5 mg daily as needed. Benefiber twice daily and adequate fluid intake.   Asthma, intermittent, uncomplicated Well-controlled. Continue albuterol 2 puffs every 6 hours as needed for exacerbations. Follow-up annually and as needed.  GERD (gastroesophageal reflux disease) Rx for Protonix 40 mg sent to her pharmacy. GERD precautions also important like avoiding coughing and chocolate as well as other type of foods that exacerbate symptoms. Follow-up in 3-4 months.       -Ms. SHAMYIA GRANDPRE was advised to return sooner than planned today if new concerns arise.       Mikaeel Petrow G. [place], MD  [facility]. [place] office.

## 2018-03-01 NOTE — Telephone Encounter (Signed)
Dr Jana Hakim would like dr notes and CT results from [facility] and dr Jacquly[facility]. Pt here- she completed release.  Notified both offices and received [facility] notes via fax. Awaiting fax from dr [facility] office.

## 2018-03-01 NOTE — Progress Notes (Addendum)
[place]  [phone]) [phone] Fax:(336) [phone]   ID: Tamara [name] OB: [birth year]  MR#: [id number]  FYB#:[id number]  Patient Care Team: Martinique, Betty G, MD as PCP - General (Family Medicine) Magrinat, Virgie Dad, MD as Consulting Physician (Oncology) Allyn Kenner, MD (Dermatology) Excell Seltzer, MD as Consulting Physician (General Surgery) Everlene Farrier, MD as Consulting Physician (Obstetrics and Gynecology) Nickie Retort, MD as Consulting Physician (Urology) Susa Day, MD as Consulting Physician (Orthopedic Surgery)  CHIEF COMPLAINT: left breast ductal carcinoma in situ.  CURRENT TREATMENT: anastrozole   BREAST CANCER HISTORY: From the original intake note:  Louiza had routine screening mammography at the [facility] Center 10/03/2013 showing suspicious calcifications in the left breast. Additional views 10/25/2013 confirmed a 2.6 area of pleomorphic calcification in the subareolar left breast. There was no associated mass. Needle core biopsy could not be performed for body habitus reasons. Excisional biopsy of this area 01/24/2014 showed ([id number]) ductal carcinoma in situ, high-grade, estrogen receptor 91% positive with strong staining intensity, progesterone receptor negative. There were multiple margins that were "very close" (less than half a millimeter).  Accordingly on 02/02/2014 the patient underwent further left breast excision. Margins remained close, but now are clear. The patient had an uneventful postoperative course and t proceeded to adjuvant radiation, which will be completed late May 2015.  Her subsequent history is as detailed below.  INTERVAL HISTORY: Maurie returns today for follow-up of her estrogen receptor positive breast cancer. She continues on anastrozole, with good tolerance. Her hot flashes have improved. She has some issues with vaginal dryness. She uses OTC cream to help. She was also prescribed a cream through her  gynecologist.   Since her last visit, she underwent diagnostic bilateral mammography with CAD and tomography on 07/27/2017 at [facility] showing: breast density category C. There was no evidence of malignancy.    REVIEW OF SYSTEMS: Rebbeca reports that she is well overall. She will meet with her PCP on 03/02/2018. She has been having back and left hip pain. She was following up with Dr. Erasmo Score  She completed CT, MRI, and x-ray scans and it found a right adrenal mass in addition to scoliosis. She was refered to Dr. Pilar Jarvis in urology she had lab work drawn there which is summarized below.  She was told that everything was "all right".. She is trying to drink more water, but she feels that she is not going to the bathroom enough. She denies unusual headaches, visual changes, nausea, vomiting, or dizziness. There has been no unusual cough, phlegm production, or pleurisy. This been no change in bowel or bladder habits. She denies unexplained fatigue or unexplained weight loss, bleeding, rash, or fever. A detailed review of systems was otherwise stable.   PAST MEDICAL HISTORY: Past Medical History:  Diagnosis Date  . Arthritis    knees   . Asthma    rare;only when around alot of dust-Ventolin inhaler as needed  . Breast cancer ([place])     left. States she did not have lymph nodes removed  . Bursitis of right shoulder   . Cellulitis and abscess of leg   . Complication of anesthesia    was told 01/06/14 that airway was small  . Depression    takes Effexor and Wellbutrin daily  . Hearing loss   . History of bronchitis 1966  . Hyperlipidemia   . Hypertension    takes Coreg and Losartan daily  . Joint pain   . Joint swelling   .  Obese   . Peripheral edema    takes Furosemide daily as needed and Potassium daily  . Personal history of radiation therapy 2015  . Radiation 03/08/14-04/26/14   50.4 gray to left breast. Lumpectomy cavity boosted to 64.4 gray  . Restless legs syndrome    takes  depakote  . Sleep apnea, obstructive    uses CPAP  . Wears glasses     PAST SURGICAL HISTORY: Past Surgical History:  Procedure Laterality Date  . BREAST LUMPECTOMY Left 2015  . BREAST LUMPECTOMY WITH NEEDLE LOCALIZATION Left 01/24/2014   Procedure: BREAST LUMPECTOMY WITH NEEDLE LOCALIZATION;  Surgeon: Edward Jolly, MD;  Location: [facility];  Service: General;  Laterality: Left;  . COLONOSCOPY    . COLONOSCOPY N/A 03/20/2017   Procedure: COLONOSCOPY;  Surgeon: Mauri Pole, MD;  Location: [facility];  Service: Endoscopy;  Laterality: N/A;  . DILATATION & CURETTAGE/HYSTEROSCOPY WITH TRUECLEAR N/A 01/06/2014   Procedure: DILATATION & CURETTAGE/HYSTEROSCOPY WITH TRUCLEAR;  Surgeon: Shon Millet II, MD;  Location: [facility];  Service: Gynecology;  Laterality: N/A;  . HYSTEROPLASTY  01/2014  . INNER EAR SURGERY Bilateral    for hearing loss  . JOINT REPLACEMENT Right    Knee  . LAPAROSCOPIC GASTRIC SLEEVE RESECTION N/A 01/28/2016   Procedure: LAPAROSCOPIC GASTRIC SLEEVE RESECTION WITH UPPER ENDO;  Surgeon: Excell Seltzer, MD;  Location: [facility];  Service: General;  Laterality: N/A;  . RE-EXCISION OF BREAST LUMPECTOMY Left 02/02/2014   Procedure: RE-EXCISION OF LEFT BREAST LUMPECTOMY;  Surgeon: Edward Jolly, MD;  Location: [facility];  Service: General;  Laterality: Left;  . TUBAL LIGATION      FAMILY HISTORY Family History  Problem Relation Age of Onset  . Dementia Mother   . Heart attack Mother   . Esophageal cancer Father        also had stomach cancer  . Heart attack Father   . Asthma Other   . Hypertension Other   . Thyroid disease Other   . Heart attack Other   . Throat cancer Paternal Grandfather    the patient's father died in his late 30s from throat and stomach cancer. The patient's mother died in her early 72s with congestive heart failure and dementia. The patient had 2 brothers, no sisters. There is no history of breast or ovarian cancer in the  family  GYNECOLOGIC HISTORY:   Menarche age 62, first live birth age 57, she is [place] P3. She went through menopause more than 15 years ago. She did not take hormone replacement. 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 She is divorced and lives by herself, with no pets. [name] works as an Clinical biochemist for [employer]. Son [name] works as a Printmaker for the same company. Daughter [name] is an Scientist, physiological for [employer] Shalondra has 4 grandchildren " "by love", 3 "by blood"--as of April 2019 the patient is working mornings caring for the mother and granddaughter of a neighbor who have both Alzheimer's disease (the granddaughter also has retardation).  ADVANCED DIRECTIVES:  in place. The patient has named her son [name] as her healthcare power of attorney. He can be reached at [phone]   HEALTH MAINTENANCE: Social History   Tobacco Use  . Smoking status: Never Smoker  . Smokeless tobacco: Never Used  Substance Use Topics  . Alcohol use: No  . Drug use:  No     Colonoscopy: 2008/[facility]   PAP: 2014   [facility]   Lipid panel:  No Known Allergies  Current Outpatient Medications  Medication Sig Dispense Refill  . amLODipine (NORVASC) 2.5 MG tablet Take 1 tablet (2.5 mg total) by mouth daily. 90 tablet 2  . anastrozole (ARIMIDEX) 1 MG tablet Take 1 tablet (1 mg total) by mouth daily. 90 tablet 3  . aspirin EC 81 MG tablet Take 81 mg by mouth at bedtime.    Marland Kitchen atorvastatin (LIPITOR) 20 MG tablet Take 1 tablet (20 mg total) by mouth daily at 6 PM. 90 tablet 1  . buPROPion (WELLBUTRIN SR) 150 MG 12 hr tablet Take 1 tablet (150 mg total) by mouth 2 (two) times daily. 60 tablet 4  . Calcium Carbonate-Vitamin D (CALCIUM PLUS VITAMIN D PO) Take 1 tablet by mouth daily.    . carvedilol (COREG) 12.5 MG tablet Take 1  tablet (12.5 mg total) by mouth 2 (two) times daily with a meal. 180 tablet 2  . clindamycin (CLINDAGEL) 1 % gel Apply topically 2 (two) times daily. 30 g 0  . cloNIDine (CATAPRES - DOSED IN MG/24 HR) 0.1 mg/24hr patch Place 1 patch (0.1 mg total) onto the skin once a week. APPLY ONE PATCH TOPICALLY ONCE A WEEK USE FOR HEADACHES (Patient taking differently: Place 0.1 mg onto the skin every Tuesday. ) 4 patch 0  . cloNIDine (CATAPRES - DOSED IN MG/24 HR) 0.1 mg/24hr patch APPLY 1 PATCH TOPICALLY ONCE A WEEK, USE FOR HEADACHES 4 patch 0  . Cyanocobalamin (VITAMIN B-12) 1000 MCG/15ML LIQD Take 5 mLs by mouth daily.    . diphenhydramine-acetaminophen (TYLENOL PM) 25-500 MG TABS Take 2 tablets by mouth at bedtime as needed (for sleep).     . flintstones complete (FLINTSTONES) 60 MG chewable tablet Chew 1 tablet by mouth daily.    . fluconazole (DIFLUCAN) 150 MG tablet Take one tablet by mount daily for 3 days may repeat if symptoms recur 30 tablet 1  . fluticasone (CUTIVATE) 0.05 % cream Apply 1 application topically as needed (rash). 30 g 1  . furosemide (LASIX) 40 MG tablet Take 0.5 tablets (20 mg total) by mouth daily as needed for edema. 90 tablet 0  . HYDROcodone-acetaminophen (NORCO/VICODIN) 5-325 MG tablet Take 1 tablet by mouth every 12 (twelve) hours as needed for moderate pain. 40 tablet 0  . ketoconazole (NIZORAL) 2 % cream Apply 1 application topically daily as needed (rash). 15 g 4  . linaclotide (LINZESS) 290 MCG CAPS capsule Take 1 capsule (290 mcg total) daily before breakfast by mouth. 90 capsule 2  . LORazepam (ATIVAN) 1 MG tablet TAKE ONE TABLET BY MOUTH TWICE DAILY AS NEEDED (Patient taking differently: Take 1 tablet by mouth twice daily as needed for anxiety) 60 tablet 0  . losartan (COZAAR) 100 MG tablet Take 1 tablet (100 mg total) by mouth daily. 90 tablet 1  . Melatonin ER 5 MG TBCR Take 1 tablet by mouth at bedtime. 90 tablet 3  . pantoprazole (PROTONIX) 40 MG tablet Take 40 mg  by mouth daily.    . Pediatric Multiple Vitamins (MULTIVITAMIN) LIQD Take 5 mLs by mouth daily.    . predniSONE (DELTASONE) 20 MG tablet 3 tabs for 3 days, 2 tabs for 3 days, 1 tabs for 3 days, and 1/2 tab for 3 days. Take tables together with breakfast. 20 tablet 0  . venlafaxine XR (EFFEXOR-XR) 150 MG 24 hr  capsule TAKE 1 CAPSULE BY MOUTH DAILY WITH BREAKFAST 30 capsule 3  . VENTOLIN HFA 108 (90 Base) MCG/ACT inhaler INHALE TWO PUFFS BY MOUTH EVERY 6 HOURS AS NEEDED FOR WHEEZING OR SHORTNESS OF BREATH 36 each 3   No current facility-administered medications for this visit.     OBJECTIVE: Middle-aged white woman who appears older than stated age  51:   03/01/18 1410  BP: (!) 161/93  Pulse: (!) 57  Resp: 18  Temp: 98.2 F (36.8 C)  SpO2: 100%     Body mass index is 36.32 kg/m.    ECOG FS:1 - Symptomatic but completely ambulatory  Sclerae unicteric, EOMs intact Oropharynx clear and moist No cervical or supraclavicular adenopathy Lungs no rales or rhonchi Heart regular rate and rhythm Abd soft, nontender, positive bowel sounds, no masses palpated MSK mild kyphosis and scoliosis but no focal spinal tenderness, no upper extremity lymphedema Neuro: nonfocal, well oriented, appropriate affect Breasts: The right breast is unremarkable.  On the left the patient has undergone lumpectomy and radiation.  There is no evidence of local recurrence.  Both axillae are benign.   LAB RESULTS: I No results found for: SPEP  Lab Results  Component Value Date   WBC 7.0 02/13/2016   NEUTROABS 5.1 02/13/2016   HGB 14.2 02/13/2016   HCT 43.7 02/13/2016   MCV 86.1 02/13/2016   PLT 176 02/13/2016      Chemistry      Component Value Date/Time   NA 141 12/02/2017 0902   NA 140 02/13/2016 1411   K 5.0 12/02/2017 0902   K 4.4 02/13/2016 1411   CL 104 12/02/2017 0902   CO2 29 12/02/2017 0902   CO2 28 02/13/2016 1411   BUN 27 (H) 12/02/2017 0902   BUN 30.8 (H) 02/13/2016 1411    CREATININE 0.85 12/02/2017 0902   CREATININE 0.9 02/13/2016 1411      Component Value Date/Time   CALCIUM 9.3 12/02/2017 0902   CALCIUM 9.9 02/13/2016 1411   ALKPHOS 61 02/13/2016 1411   AST 21 02/13/2016 1411   ALT 32 02/13/2016 1411   BILITOT 0.44 02/13/2016 1411       No results found for: LABCA2  No components found for: LABCA125  No results for input(s): INR in the last 168 hours.  Urinalysis    Component Value Date/Time   COLORURINE yellow 03/19/2010 0815   APPEARANCEUR Clear 03/19/2010 0815   LABSPEC 1.015 03/19/2010 0815   PHURINE 7.0 03/19/2010 0815   GLUCOSEU NEGATIVE 11/14/2009 1155   HGBUR negative 03/19/2010 0815   BILIRUBINUR n 09/21/2014 1024   KETONESUR NEGATIVE 11/14/2009 1155   PROTEINUR n 09/21/2014 1024   PROTEINUR NEGATIVE 11/14/2009 1155   UROBILINOGEN 0.2 09/21/2014 1024   UROBILINOGEN 0.2 03/19/2010 0815   NITRITE n 09/21/2014 1024   NITRITE negative 03/19/2010 0815   LEUKOCYTESUR Negative 09/21/2014 1024   Dr Pilar Jarvis obtain a screening adrenal panel on 01/18/2018 showing the 24-hour urine for epinephrine below the reportable range, the norepinephrine at 63, the dopamine at 146, the VMA at 3.9, although is being in the normal range.  Creatinine was 0.91.  STUDIES: Since her last visit, she underwent diagnostic bilateral mammography with CAD and tomography on 07/27/2017 at [facility] showing: breast density category C. There was no evidence of malignancy.   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 status post left lumpectomy 01/24/2014 for ductal  carcinoma in situ, high-grade, estrogen receptor 91% positive, progesterone receptor negative, with positive margins  (1) status post further left breast excision for margin clearance 02/02/2014, achieving close but negative margins.  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  This is very favorable.  She continues on anastrozole with good tolerance.  The plan will be to continue that for a total of 5 years  I reviewed the fact that noninvasive breast cancer generally cannot spread to other parts of the bodies of any other problems she is having right now are not going to be related to that issue.  We discussed adrenal adenomas, which are not uncommon.  Hers is approaching 4 cm.  We do not have a copy of the CT scan obtained by radiology.  She did have a workup for activity through urology and that was negative.  She had herself tested for genetics and a mutation was found.  I have asked our genetics people to comment.  Otherwise she will see me again in 1 year.  She knows to call for any problems that may develop before that visit.  Magrinat, Virgie Dad, MD  03/01/18 2:34 PM Medical Oncology and Hematology [facility] [address] [place], [place] [address] Tel. [phone]    Fax. [phone]  This document serves as a record of services personally performed by Lurline Del, MD. It was created on his behalf by Sheron Nightingale, a trained medical scribe. The creation of this record is based on the scribe's personal observations and the provider's statements to them.   I have reviewed the above documentation for accuracy and completeness, and I agree with the above.   ADDENDUM:  Review of the CT scan of the abdomen and pelvis obtained with and  without contrast by Dr. Pilar Jarvis on 01/22/2018 shows a right adrenal mass measuring 4.1 cm with density and washout readings consistent with adrenal adenoma.  There was also a 1.1 cm left adrenal adenoma.  There was some incidental renal lesions which were too small to characterize.  He also obtained a metabolic workup which was negative as detailed above.  SECOND ADDENDUM: Discussed mutation with our genetics counselor; this is associated with melanoma and also possibly kidney cancer.  However she does not recognize the testing lab.  If the testing lab is not meeting the clinical laboratory improvement amendments (CLIA) federal standards then we should not be following up with renal ultrasounds or yearly skin exams although the latter can be recommended in any case.  I will asked the patient to bring back that paper which I do not believe we copied.

## 2018-03-02 ENCOUNTER — Ambulatory Visit (INDEPENDENT_AMBULATORY_CARE_PROVIDER_SITE_OTHER): Payer: Medicare Other | Admitting: Family Medicine

## 2018-03-02 ENCOUNTER — Encounter: Payer: Self-pay | Admitting: Family Medicine

## 2018-03-02 VITALS — BP 132/80 | HR 65 | Temp 98.5°F | Resp 12 | Ht 66.0 in | Wt 223.5 lb

## 2018-03-02 DIAGNOSIS — F419 Anxiety disorder, unspecified: Secondary | ICD-10-CM

## 2018-03-02 DIAGNOSIS — R600 Localized edema: Secondary | ICD-10-CM

## 2018-03-02 DIAGNOSIS — M159 Polyosteoarthritis, unspecified: Secondary | ICD-10-CM | POA: Diagnosis not present

## 2018-03-02 DIAGNOSIS — F329 Major depressive disorder, single episode, unspecified: Secondary | ICD-10-CM | POA: Diagnosis not present

## 2018-03-02 DIAGNOSIS — K219 Gastro-esophageal reflux disease without esophagitis: Secondary | ICD-10-CM

## 2018-03-02 DIAGNOSIS — J4520 Mild intermittent asthma, uncomplicated: Secondary | ICD-10-CM | POA: Diagnosis not present

## 2018-03-02 DIAGNOSIS — K5900 Constipation, unspecified: Secondary | ICD-10-CM

## 2018-03-02 MED ORDER — PANTOPRAZOLE SODIUM 40 MG PO TBEC: 40.0000 mg | DELAYED_RELEASE_TABLET | Freq: Every day | ORAL | 1 refills | Status: DC

## 2018-03-02 MED ORDER — ALBUTEROL SULFATE HFA 108 (90 BASE) MCG/ACT IN AERS: 2.0000 | INHALATION_SPRAY | Freq: Four times a day (QID) | RESPIRATORY_TRACT | 2 refills | Status: DC | PRN

## 2018-03-02 MED ORDER — BUPROPION HCL ER (SR) 150 MG PO TB12: 150.0000 mg | ORAL_TABLET | Freq: Two times a day (BID) | ORAL | 2 refills | Status: DC

## 2018-03-02 MED ORDER — FUROSEMIDE 40 MG PO TABS: 20.0000 mg | ORAL_TABLET | Freq: Every day | ORAL | 2 refills | Status: DC | PRN

## 2018-03-02 NOTE — Patient Instructions (Addendum)
A few things to remember from today's visit:   Generalized osteoarthritis of multiple sites  Anxiety and depression - Plan: buPROPion (WELLBUTRIN SR) 150 MG 12 hr tablet  Essential hypertension, benign - Plan: furosemide (LASIX) 40 MG tablet  Bilateral lower extremity edema - Plan: furosemide (LASIX) 40 MG tablet  Sleep apnea - Plan: albuterol (VENTOLIN HFA) 108 (90 Base) MCG/ACT inhaler  No changes today.   Please be sure medication list is accurate. If a new problem present, please set up appointment sooner than planned today.

## 2018-03-02 NOTE — Assessment & Plan Note (Signed)
Stable overall. No changes in current management: Wellbutrin SR 150 mg twice daily and Effexor XR 150 mg daily. She will continue Lorazepam 1 mg twice daily as needed. Follow-up in 3 months, before if needed.

## 2018-03-02 NOTE — Assessment & Plan Note (Signed)
Rx for Protonix 40 mg sent to her pharmacy. GERD precautions also important like avoiding coughing and chocolate as well as other type of foods that exacerbate symptoms. Follow-up in 3-4 months.

## 2018-03-02 NOTE — Assessment & Plan Note (Signed)
We discussed some side effects of opiate medications, including constipation. Pain is stable. No changes in current management. Medical contract in 05/2017.

## 2018-03-02 NOTE — Assessment & Plan Note (Signed)
Well-controlled. Continue albuterol 2 puffs every 6 hours as needed for exacerbations. Follow-up annually and as needed.

## 2018-03-02 NOTE — Assessment & Plan Note (Signed)
Seems to be persistent on Linzess is no longer helping. Linzess to be discontinue. For now she will continue with OTC MiraLAX daily and Dulcolax 5 mg daily as needed. Benefiber twice daily and adequate fluid intake.

## 2018-03-03 ENCOUNTER — Other Ambulatory Visit: Payer: Self-pay | Admitting: Oncology

## 2018-03-03 NOTE — Progress Notes (Signed)
AM  Tamara [name], [name] ?  Here is also our summary of MITF if that is also helpful for you:  The MITF gene provides instructions for making a protein called microphthalmia-associated transcription factor. This protein plays a role in the development, survival, and function of certain types of cells. To carry out this role, the protein attaches to specific areas of DNA and helps control the activity of particular genes. On the basis of this action, the protein is called a transcription factor.  The MITF protein helps control the development and function of pigment-producing cells called melanocytes. Within these cells, this protein also controls production of the pigment melanin, which contributes to hair, eye, and skin color. Melanocytes are also found in the inner ear and play an important role in hearing. Additionally, the MITF protein regulates the development of specialized cells in the eye called retinal pigment epithelial cells. These cells nourish the retina, the part of the eye that detects light and color.  The MITF gene is considered a moderate risk gene for malignant melanoma. Although the exact lifetime risk for melanoma has not yet been established, it has been estimated to be approximately 2.5%. It is estimated that individuals with a MITF gene mutation have at least a 5-fold risk (over that of the general population risk) to develop melanoma and kidney cancer. This risk can be managed with appropriate screening.  There are currently no published medical management guidelines for individuals at increased cancer risk due to a MITF mutation. We expect that medical management guidelines for the MITF gene may become available over time, thus we encouraged @M @ @LNAME @ to contact us regularly for any updates.  Given the increased melanoma risk, studies suggest that it is reasonable to consider regular skin cancer screening. Of note, the article by Elmon Kirschner al. (2015) contains considerations for medical  management, including sun protection, monthly self skin exams, and dermatologic surveillance. Once under the care of a dermatologist, they can recommend the frequency of skin examinations, most commonly annually, but sometimes more frequent. The following are general medical recommendations for individuals who have mutations in the MITF gene.  Screening and Management Options: We discussed various risk management and reduction choices for individuals in high-risk categories, including increased screening, preventive surgeries and chemoprevention.  - Regarding kidney cancer risk management: We suggest at least annual imaging (as determined by your Urologist) for kidney tumors and regular clinical exams with a Urologist. We discussed that many times imaging of the kidneys can find unexpected findings, many times that are benign. Therefore, if renal screening commences patients should expect that any findings will need to be followed up upon and tracked.  - Regarding melanoma risk management: Annual dermatology examination and prompt evaluation for any abnormal skin lesions beginning now. We suggest avoidance of sunburn and tanning. Annual Ophthalmologic/retinal exams for the infrequent occurrence of retinal melanoma. Ferol Luz, M.S., [facility]

## 2018-03-08 ENCOUNTER — Other Ambulatory Visit: Payer: Self-pay | Admitting: Oncology

## 2018-03-14 ENCOUNTER — Other Ambulatory Visit: Payer: Self-pay | Admitting: Family Medicine

## 2018-03-14 ENCOUNTER — Other Ambulatory Visit: Payer: Self-pay | Admitting: Oncology

## 2018-03-14 DIAGNOSIS — M159 Polyosteoarthritis, unspecified: Secondary | ICD-10-CM

## 2018-03-14 NOTE — Progress Notes (Unsigned)
Obtain results from Dr. Bernadette Hoit office regarding lumbar MRI obtained by them.  This showed multifactorial stenosis and multilevel disc degeneration.  There was an incidental adrenal adenoma.  She was referred to urology for further evaluation.

## 2018-03-15 MED ORDER — HYDROCODONE-ACETAMINOPHEN 5-325 MG PO TABS: 1.0000 | ORAL_TABLET | Freq: Two times a day (BID) | ORAL | 0 refills | Status: DC | PRN

## 2018-04-05 ENCOUNTER — Other Ambulatory Visit: Payer: Self-pay | Admitting: Family Medicine

## 2018-04-05 DIAGNOSIS — I10 Essential (primary) hypertension: Secondary | ICD-10-CM

## 2018-04-07 ENCOUNTER — Other Ambulatory Visit: Payer: Self-pay | Admitting: Family Medicine

## 2018-04-07 DIAGNOSIS — M159 Polyosteoarthritis, unspecified: Secondary | ICD-10-CM

## 2018-04-09 MED ORDER — HYDROCODONE-ACETAMINOPHEN 5-325 MG PO TABS: 1.0000 | ORAL_TABLET | Freq: Two times a day (BID) | ORAL | 0 refills | Status: DC | PRN

## 2018-04-14 ENCOUNTER — Other Ambulatory Visit: Payer: Self-pay | Admitting: *Deleted

## 2018-04-14 ENCOUNTER — Encounter: Payer: Self-pay | Admitting: Oncology

## 2018-04-14 MED ORDER — VENLAFAXINE HCL ER 150 MG PO CP24: ORAL_CAPSULE | ORAL | 3 refills | Status: DC

## 2018-04-19 ENCOUNTER — Encounter: Payer: Self-pay | Admitting: Pulmonary Disease

## 2018-04-19 ENCOUNTER — Ambulatory Visit (INDEPENDENT_AMBULATORY_CARE_PROVIDER_SITE_OTHER): Payer: Medicare Other | Admitting: Pulmonary Disease

## 2018-04-19 VITALS — BP 142/84 | HR 58 | Ht 66.0 in | Wt 224.2 lb

## 2018-04-19 DIAGNOSIS — G4733 Obstructive sleep apnea (adult) (pediatric): Secondary | ICD-10-CM | POA: Diagnosis not present

## 2018-04-19 NOTE — Assessment & Plan Note (Signed)
Much improved and she is sleeping better

## 2018-04-19 NOTE — Patient Instructions (Signed)
Home sleep study to assess whether weight loss is helping with sleep apnea.  Trial of air fit F 30 fullface mask next time you are due for mask change

## 2018-04-19 NOTE — Progress Notes (Signed)
   Subjective:    Patient ID: Tamara [name], female    DOB: 11-26-1951, 67 y.o.   MRN: [id number]  HPI  67 year old woman for follow-up of OSA and sleep maintenance insomnia She underwent gastric sleeve surgery in 2016 and lost about 70 pounds.  She has now plateaued at around 220 pounds  She continues to use her CPAP machine religiously and had good improvement with the daytime somnolence and fatigue.  No problems with mask or pressure.  She uses a fullface management aspirin Download was reviewed which shows excellent control of events and good compliance more than 8 hours every night on 9 cm with moderate leak  No Dryness of air passages  Significant tests/ events reviewed  PSG 03/2013 - 291 lbs-  severe OSA with AHI 66/hour which was corrected with CPAP of 9 cm.  She also had moderate PLM's 40/hour, however PLM arousal index was low at 0.9/hour.   Review of Systems Patient denies significant dyspnea,cough, hemoptysis,  chest pain, palpitations, pedal edema, orthopnea, paroxysmal nocturnal dyspnea, lightheadedness, nausea, vomiting, abdominal or  leg pains      Objective:   Physical Exam   Gen. Pleasant, obese, in no distress ENT - no lesions, no post nasal drip Neck: No JVD, no thyromegaly, no carotid bruits Lungs: no use of accessory muscles, no dullness to percussion, decreased without rales or rhonchi  Cardiovascular: Rhythm regular, heart sounds  normal, no murmurs or gallops, no peripheral edema Musculoskeletal: No deformities, no cyanosis or clubbing , no tremors        Assessment & Plan:

## 2018-04-19 NOTE — Assessment & Plan Note (Signed)
Home sleep study to assess whether weight loss is helping with sleep apnea.  Trial of air fit F 30 fullface mask next time you are due for mask change  Weight loss encouraged, compliance with goal of at least 4-6 hrs every night is the expectation. Advised against medications with sedative side effects Cautioned against driving when sleepy - understanding that sleepiness will vary on a day to day basis

## 2018-04-21 DIAGNOSIS — Z9884 Bariatric surgery status: Secondary | ICD-10-CM | POA: Diagnosis not present

## 2018-04-28 ENCOUNTER — Encounter: Payer: Self-pay | Admitting: Oncology

## 2018-05-05 ENCOUNTER — Other Ambulatory Visit: Payer: Self-pay | Admitting: Oncology

## 2018-05-05 ENCOUNTER — Other Ambulatory Visit: Payer: Self-pay | Admitting: Family Medicine

## 2018-05-05 DIAGNOSIS — M159 Polyosteoarthritis, unspecified: Secondary | ICD-10-CM

## 2018-05-05 NOTE — Progress Notes (Unsigned)
We received a note from Tamara [name] regarding her right adrenal mass.  Recall she was referred to urology in April and she tells me that she did see a urologist although I cannot identify the name she gave me, and that she was called and was told everything was "all right".  She also tells me that Dr. been obtained a CT scan on her and that she was told it was "all right".  I do not have a copy of Dr. Bernadette Hoit notes but a CT scan of the abdomen and pelvis is what was suggested.  We will try to obtain those records.

## 2018-05-10 MED ORDER — HYDROCODONE-ACETAMINOPHEN 5-325 MG PO TABS: 1.0000 | ORAL_TABLET | Freq: Two times a day (BID) | ORAL | 0 refills | Status: DC | PRN

## 2018-05-17 DIAGNOSIS — G4733 Obstructive sleep apnea (adult) (pediatric): Secondary | ICD-10-CM | POA: Diagnosis not present

## 2018-05-18 ENCOUNTER — Other Ambulatory Visit: Payer: Self-pay | Admitting: *Deleted

## 2018-05-18 DIAGNOSIS — G4733 Obstructive sleep apnea (adult) (pediatric): Secondary | ICD-10-CM

## 2018-05-19 DIAGNOSIS — M549 Dorsalgia, unspecified: Secondary | ICD-10-CM | POA: Diagnosis not present

## 2018-05-19 DIAGNOSIS — Z9884 Bariatric surgery status: Secondary | ICD-10-CM | POA: Diagnosis not present

## 2018-05-19 DIAGNOSIS — L304 Erythema intertrigo: Secondary | ICD-10-CM | POA: Diagnosis not present

## 2018-05-19 DIAGNOSIS — L919 Hypertrophic disorder of the skin, unspecified: Secondary | ICD-10-CM | POA: Diagnosis not present

## 2018-05-19 DIAGNOSIS — M793 Panniculitis, unspecified: Secondary | ICD-10-CM | POA: Diagnosis not present

## 2018-05-20 ENCOUNTER — Telehealth: Payer: Self-pay | Admitting: Pulmonary Disease

## 2018-05-20 NOTE — Telephone Encounter (Signed)
Per RA, HST showed mild OSA with 13 events per hour. Events only noted when sleeping in the supine position. Events have improved. Recommends continuing cpap machine for now.

## 2018-05-26 NOTE — Telephone Encounter (Signed)
Pt is returning call. Cb is [phone]

## 2018-05-26 NOTE — Telephone Encounter (Signed)
Called spoke with patient, discussed HST results/recs as stated by RA Pt voiced her understanding Nothing further needed; will sign off

## 2018-05-26 NOTE — Telephone Encounter (Signed)
LMTCB

## 2018-05-31 ENCOUNTER — Encounter: Payer: Self-pay | Admitting: Family Medicine

## 2018-06-08 ENCOUNTER — Ambulatory Visit: Payer: Self-pay | Admitting: Family Medicine

## 2018-06-10 ENCOUNTER — Encounter (HOSPITAL_BASED_OUTPATIENT_CLINIC_OR_DEPARTMENT_OTHER): Payer: Self-pay | Admitting: *Deleted

## 2018-06-11 ENCOUNTER — Encounter: Payer: Self-pay | Admitting: Family Medicine

## 2018-06-11 ENCOUNTER — Ambulatory Visit (INDEPENDENT_AMBULATORY_CARE_PROVIDER_SITE_OTHER): Payer: Medicare Other | Admitting: Family Medicine

## 2018-06-11 ENCOUNTER — Encounter (HOSPITAL_BASED_OUTPATIENT_CLINIC_OR_DEPARTMENT_OTHER)
Admission: RE | Admit: 2018-06-11 | Discharge: 2018-06-11 | Disposition: A | Discharge status: Home / Self Care | Payer: Medicare Other | Source: Ambulatory Visit | Attending: Plastic Surgery | Admitting: Plastic Surgery

## 2018-06-11 VITALS — BP 124/82 | HR 65 | Temp 98.0°F | Resp 16 | Ht 66.0 in | Wt 227.5 lb

## 2018-06-11 DIAGNOSIS — Z01810 Encounter for preprocedural cardiovascular examination: Secondary | ICD-10-CM | POA: Diagnosis not present

## 2018-06-11 DIAGNOSIS — F419 Anxiety disorder, unspecified: Secondary | ICD-10-CM | POA: Diagnosis not present

## 2018-06-11 DIAGNOSIS — R001 Bradycardia, unspecified: Secondary | ICD-10-CM | POA: Diagnosis not present

## 2018-06-11 DIAGNOSIS — N281 Cyst of kidney, acquired: Secondary | ICD-10-CM

## 2018-06-11 DIAGNOSIS — M159 Polyosteoarthritis, unspecified: Secondary | ICD-10-CM

## 2018-06-11 DIAGNOSIS — I10 Essential (primary) hypertension: Secondary | ICD-10-CM | POA: Diagnosis not present

## 2018-06-11 DIAGNOSIS — R9431 Abnormal electrocardiogram [ECG] [EKG]: Secondary | ICD-10-CM | POA: Insufficient documentation

## 2018-06-11 DIAGNOSIS — W57XXXA Bitten or stung by nonvenomous insect and other nonvenomous arthropods, initial encounter: Secondary | ICD-10-CM

## 2018-06-11 DIAGNOSIS — Z01812 Encounter for preprocedural laboratory examination: Secondary | ICD-10-CM | POA: Insufficient documentation

## 2018-06-11 DIAGNOSIS — F329 Major depressive disorder, single episode, unspecified: Secondary | ICD-10-CM | POA: Diagnosis not present

## 2018-06-11 DIAGNOSIS — K5900 Constipation, unspecified: Secondary | ICD-10-CM | POA: Diagnosis not present

## 2018-06-11 LAB — URINALYSIS, ROUTINE W REFLEX MICROSCOPIC
Bilirubin Urine: NEGATIVE
Hgb urine dipstick: NEGATIVE
Leukocytes, UA: NEGATIVE
Nitrite: NEGATIVE
PH: 6 (ref 5.0–8.0)
RBC / HPF: NONE SEEN (ref 0–?)
Specific Gravity, Urine: 1.025 (ref 1.000–1.030)
UROBILINOGEN UA: 1 (ref 0.0–1.0)
Urine Glucose: NEGATIVE

## 2018-06-11 LAB — COMPREHENSIVE METABOLIC PANEL
ALK PHOS: 76 U/L (ref 38–126)
ALT: 17 U/L (ref 0–44)
AST: 18 U/L (ref 15–41)
Albumin: 3.4 g/dL — ABNORMAL LOW (ref 3.5–5.0)
Anion gap: 8 (ref 5–15)
BUN: 21 mg/dL (ref 8–23)
CHLORIDE: 109 mmol/L (ref 98–111)
CO2: 24 mmol/L (ref 22–32)
CREATININE: 0.79 mg/dL (ref 0.44–1.00)
Calcium: 9.1 mg/dL (ref 8.9–10.3)
GFR calc Af Amer: >60 mL/min (ref >60)
Glucose, Bld: 97 mg/dL (ref 70–99)
Potassium: 3.5 mmol/L (ref 3.5–5.1)
Sodium: 141 mmol/L (ref 135–145)
Total Bilirubin: 0.7 mg/dL (ref 0.3–1.2)
Total Protein: 6.4 g/dL — ABNORMAL LOW (ref 6.5–8.1)

## 2018-06-11 LAB — CBC WITH DIFFERENTIAL/PLATELET
ABS IMMATURE GRANULOCYTES: 0 10*3/uL (ref 0.0–0.1)
Basophils Absolute: 0 10*3/uL (ref 0.0–0.1)
Basophils Relative: 1 %
Eosinophils Absolute: 0.2 10*3/uL (ref 0.0–0.7)
Eosinophils Relative: 3 %
HEMATOCRIT: 41.5 % (ref 36.0–46.0)
HEMOGLOBIN: 13.5 g/dL (ref 12.0–15.0)
Immature Granulocytes: 0 %
LYMPHS PCT: 19 %
Lymphs Abs: 1.4 10*3/uL (ref 0.7–4.0)
MCH: 28.8 pg (ref 26.0–34.0)
MCHC: 32.5 g/dL (ref 30.0–36.0)
MCV: 88.7 fL (ref 78.0–100.0)
MONO ABS: 0.7 10*3/uL (ref 0.1–1.0)
MONOS PCT: 10 %
Neutro Abs: 5 10*3/uL (ref 1.7–7.7)
Neutrophils Relative %: 67 %
Platelets: 198 10*3/uL (ref 150–400)
RBC: 4.68 MIL/uL (ref 3.87–5.11)
RDW: 13.2 % (ref 11.5–15.5)
WBC: 7.4 10*3/uL (ref 4.0–10.5)

## 2018-06-11 MED ORDER — LUBIPROSTONE 24 MCG PO CAPS: 24.0000 ug | ORAL_CAPSULE | Freq: Two times a day (BID) | ORAL | 2 refills | Status: DC

## 2018-06-11 MED ORDER — TRIAMCINOLONE ACETONIDE 0.1 % EX CREA: 1.0000 "application " | TOPICAL_CREAM | Freq: Two times a day (BID) | CUTANEOUS | 0 refills | Status: AC

## 2018-06-11 MED ORDER — HYDROCODONE-ACETAMINOPHEN 5-325 MG PO TABS: 1.0000 | ORAL_TABLET | Freq: Four times a day (QID) | ORAL | 0 refills | Status: DC | PRN

## 2018-06-11 NOTE — Patient Instructions (Addendum)
A few things to remember from today's visit:   Generalized osteoarthritis of multiple sites - Plan: HYDROcodone-acetaminophen (NORCO/VICODIN) 5-325 MG tablet  Anxiety and depression  Constipation, unspecified constipation type - Plan: lubiprostone (AMITIZA) 24 MCG capsule  Renal cyst, left - Plan: Urinalysis, Routine w reflex microscopic  Mosquito bite, initial encounter - Plan: triamcinolone cream (KENALOG) 0.1 %   Hydrocodone to increase dose after surgery, try not to take it more than 3 times per day due to risk of worsening constipation.  Stop Linzess and try Imitiza.  Please be sure medication list is accurate. If a new problem present, please set up appointment sooner than planned today.

## 2018-06-11 NOTE — Progress Notes (Signed)
HPI:   Tamara [name] is a 67 y.o. female, who is here today for 3 months follow up.   She was last seen on 03/02/18. Chronic pain,she is on Hydrocodone-Acetaminophen 5-325 mg bid prn for pain. Lower back pain and generalized arthralgias. Pain is exacerbated by prolonged walking and standing. Alleviated by rest. Pain seems to be worse in the morning, 9/10, with some stiffness. Also shoulders, hips, and knees pain. No limitation in daily living activities. In general she is tolerating medication well.  Planning on having surgery ,panniculectomy on 06/18/18 due to recurrent lower abdominal candida infection, s/p bariatric surgery.   According to patient, recovery will take about 8 weeks.  She is supposed to sleep on a recliner for a few weeks.  Constipation: She is currently on Linzess 290 mcg daily, which is not helping with constipation. She has to take OTC MiraLAX and suppositories in order to have a bowel movement. She is having bowel movements every 2 to 3 days, hard stools.  Complaining about pruritic, erythematous skin rash on lower abdomen and lower extremities, which she attributes to mosquito bites. Lesions are not tender. No fever or chills. She has not used OTC medications.   She is also concerned about recent abdominal CT, awaiting for Tamara [name] to review it and let her know if further testing is needed.  03/08/18 Abdominal/pelvic CT:  Bilateral adrenal adenomas, R>L. Several small renal lesions left,likely benign. 2 suspected complex left renal cysts, most likely benign, recommend evaluating for hematuria. Negative for gross hematuria.  She had epinephrine, norepinephrine, dopamine, and the needed mandelic acid and 41-YSAY urine all in normal range.  Anxiety and depression: Currently she is on Wellbutrin SR 150 mg twice daily, Ativan 1 mg twice daily as needed for anxiety, and Effexor XR 150 mg daily. She denies suicidal thoughts. Medication is  still helping with depression and anxiety. No side effects reported.   Review of Systems  Constitutional: Negative for activity change, appetite change, fatigue and fever.  HENT: Negative for mouth sores, nosebleeds and trouble swallowing.   Eyes: Negative for redness and visual disturbance.  Respiratory: Negative for cough, shortness of breath and wheezing.   Cardiovascular: Negative for chest pain, palpitations and leg swelling.  Gastrointestinal: Negative for abdominal pain, nausea and vomiting.       Negative for changes in bowel habits.  Genitourinary: Negative for decreased urine volume, dysuria and hematuria.  Musculoskeletal: Positive for arthralgias and back pain.  Skin: Positive for rash. Negative for wound.  Neurological: Negative for weakness and headaches.  Psychiatric/Behavioral: Negative for confusion and suicidal ideas. The patient is nervous/anxious.       Current Outpatient Medications on File Prior to Visit  Medication Sig Dispense Refill  . albuterol (VENTOLIN HFA) 108 (90 Base) MCG/ACT inhaler Inhale 2 puffs into the lungs every 6 (six) hours as needed for wheezing or shortness of breath. 36 each 2  . amLODipine (NORVASC) 2.5 MG tablet TAKE 1 TABLET BY MOUTH DAILY 90 tablet 2  . anastrozole (ARIMIDEX) 1 MG tablet Take 1 tablet (1 mg total) by mouth daily. 90 tablet 3  . aspirin EC 81 MG tablet Take 81 mg by mouth at bedtime.    Marland Kitchen atorvastatin (LIPITOR) 20 MG tablet Take 1 tablet (20 mg total) by mouth daily at 6 PM. 90 tablet 1  . buPROPion (WELLBUTRIN SR) 150 MG 12 hr tablet Take 1 tablet (150 mg total) by mouth 2 (two) times daily. 180 tablet  2  . Calcium Carbonate-Vitamin D (CALCIUM PLUS VITAMIN D PO) Take 1 tablet by mouth daily.    . carvedilol (COREG) 12.5 MG tablet Take 1 tablet (12.5 mg total) by mouth 2 (two) times daily with a meal. 180 tablet 2  . clindamycin (CLINDAGEL) 1 % gel Apply topically 2 (two) times daily. 30 g 0  . Cyanocobalamin (VITAMIN  B-12) 1000 MCG/15ML LIQD Take 5 mLs by mouth daily.    . diphenhydramine-acetaminophen (TYLENOL PM) 25-500 MG TABS Take 2 tablets by mouth at bedtime as needed (for sleep).     . flintstones complete (FLINTSTONES) 60 MG chewable tablet Chew 1 tablet by mouth daily.    . fluconazole (DIFLUCAN) 150 MG tablet Take one tablet by mount daily for 3 days may repeat if symptoms recur 30 tablet 1  . fluticasone (CUTIVATE) 0.05 % cream Apply 1 application topically as needed (rash). 30 g 1  . furosemide (LASIX) 40 MG tablet Take 0.5 tablets (20 mg total) by mouth daily as needed for edema. 90 tablet 2  . ketoconazole (NIZORAL) 2 % cream Apply 1 application topically daily as needed (rash). 15 g 4  . LORazepam (ATIVAN) 1 MG tablet TAKE ONE TABLET BY MOUTH TWICE DAILY AS NEEDED (Patient taking differently: Take 1 tablet by mouth twice daily as needed for anxiety) 60 tablet 0  . losartan (COZAAR) 100 MG tablet TAKE 1 TABLET BY MOUTH ONCE DAILY 90 tablet 1  . Melatonin ER 5 MG TBCR Take 1 tablet by mouth at bedtime. 90 tablet 3  . nystatin cream (MYCOSTATIN) nystatin 100,000 unit/gram topical cream    . pantoprazole (PROTONIX) 40 MG tablet Take 1 tablet (40 mg total) by mouth daily. 90 tablet 1  . Pediatric Multiple Vitamins (MULTIVITAMIN) LIQD Take 5 mLs by mouth daily.    Marland Kitchen venlafaxine XR (EFFEXOR-XR) 150 MG 24 hr capsule TAKE 1 CAPSULE BY MOUTH DAILY WITH BREAKFAST 90 capsule 3  . cephALEXin (KEFLEX) 500 MG capsule Take by mouth.     . ondansetron (ZOFRAN-ODT) 4 MG disintegrating tablet DISSOLVE 1 TABLET IN MOUTH EVERY 8 HOURS AS NEEDED FOR NAUSEA FOR UP TO 7 DAYS, have not started yet, will start after skin surgery  0   No current facility-administered medications on file prior to visit.      Past Medical History:  Diagnosis Date  . Arthritis    knees   . Asthma    rare;only when around alot of dust-Ventolin inhaler as needed  . Breast cancer ([place])     left. States she did not have lymph nodes  removed  . Bursitis of right shoulder   . Cellulitis and abscess of leg   . Complication of anesthesia    was told 01/06/14 that airway was small  . Depression    takes Effexor and Wellbutrin daily  . Hearing loss   . History of bronchitis 1966  . Hyperlipidemia   . Hypertension    takes Coreg and Losartan daily  . Joint pain   . Joint swelling   . Obese   . Peripheral edema    takes Furosemide daily as needed and Potassium daily  . Personal history of radiation therapy 2015  . Radiation 03/08/14-04/26/14   50.4 gray to left breast. Lumpectomy cavity boosted to 64.4 gray  . Restless legs syndrome    takes depakote  . Sleep apnea, obstructive    uses CPAP  . Wears glasses    No Known Allergies  Social History  Socioeconomic History  . Marital status: Divorced    Spouse name: Not on file  . Number of children: 3  . Years of education: Not on file  . Highest education level: Not on file  Occupational History  . Occupation: Retail banker: [employer]  Social Needs  . Financial resource strain: Not on file  . Food insecurity:    Worry: Not on file    Inability: Not on file  . Transportation needs:    Medical: Not on file    Non-medical: Not on file  Tobacco Use  . Smoking status: Never Smoker  . Smokeless tobacco: Never Used  Substance and Sexual Activity  . Alcohol use: No  . Drug use: No  . Sexual activity: Never    Birth control/protection: Post-menopausal  Lifestyle  . Physical activity:    Days per week: Not on file    Minutes per session: Not on file  . Stress: Not on file  Relationships  . Social connections:    Talks on phone: Not on file    Gets together: Not on file    Attends religious service: Not on file    Active member of club or organization: Not on file    Attends meetings of clubs or organizations: Not on file    Relationship status: Not on file  Other Topics Concern  . Not on file  Social History Narrative   . Not on file    Vitals:   06/11/18 1101  BP: 124/82  Pulse: 65  Resp: 16  Temp: 98 F (36.7 C)  SpO2: 97%   Body mass index is 36.72 kg/m.   Physical Exam  Nursing note and vitals reviewed. Constitutional: She is oriented to person, place, and time. She appears well-developed. No distress.  HENT:  Head: Normocephalic and atraumatic.  Mouth/Throat: Oropharynx is clear and moist and mucous membranes are normal.  Eyes: Pupils are equal, round, and reactive to light. Conjunctivae are normal.  Cardiovascular: Normal rate and regular rhythm.  No murmur heard. Pulses:      Dorsalis pedis pulses are 2+ on the right side, and 2+ on the left side.  Respiratory: Effort normal and breath sounds normal. No respiratory distress.  GI: Soft. She exhibits no mass. There is no hepatomegaly. There is no tenderness.  Musculoskeletal: She exhibits no edema.       Thoracic back: She exhibits no tenderness and no bony tenderness.       Lumbar back: She exhibits no tenderness and no bony tenderness.  Knee crepitus, bilateral. No signs of synovitis.  Lymphadenopathy:    She has no cervical adenopathy.  Neurological: She is alert and oriented to person, place, and time. She has normal strength. Gait normal.  Skin: Skin is warm. Rash noted. Rash is maculopapular. No erythema.     A few erythematous papular lesions scattered on lower abdomen and LE's. Lower abdomen maculopapular erythematous rash (intertrigo).  Psychiatric: She has a normal mood and affect.  Well groomed, good eye contact.       ASSESSMENT AND PLAN:   Tamara [name] was seen today for 3 months follow-up.  Orders Placed This Encounter  Procedures  . Urinalysis, Routine w reflex microscopic     1. Generalized osteoarthritis of multiple sites  Otherwise stable, hydrocodone-acetaminophen is still helping with pain. Because she is having surgery in a few days, she will increase frequency of  hydrocodone-acetaminophen to 1 tablet 3-4  times per day as needed. Medication contract signed. We discussed side effects of opioid, including constipation. Follow-up in 3 months.  - HYDROcodone-acetaminophen (NORCO/VICODIN) 5-325 MG tablet; Take 1 tablet by mouth every 6 (six) hours as needed for moderate pain (dose changed to treat postsurgical pain.).  Dispense: 120 tablet; Refill: 0  2. Anxiety and depression  It seems to be well controlled with current regimen, no changes. Some side effects of medications discussed. She understands side effects of chronic benzodiazepine use. Follow-up in 3 to 4 months.  3. Constipation, unspecified constipation type  It is not well controlled with Linzess. She agrees with trying Amitiza 24 mcg twice daily. Continue adequate fiber and fluid intake. Follow-up in 3 months.  - lubiprostone (AMITIZA) 24 MCG capsule; Take 1 capsule (24 mcg total) by mouth 2 (two) times daily with a meal.  Dispense: 60 capsule; Refill: 2  4. Renal cyst, left  It seems to be benign. No gross hematuria. If microscopic hematuria, we will arrange renal protocol MRI without contrast.  - Urinalysis, Routine w reflex microscopic  5. Mosquito bite, initial encounter  Topical steroid twice daily for up to 14 days. Avoid scratching lesions. Follow-up as needed.  - triamcinolone cream (KENALOG) 0.1 %; Apply 1 application topically 2 (two) times daily for 14 days.  Dispense: 30 g; Refill: 0    Reyan Helle G. [place]ique, MD  [facility]. [place] office.

## 2018-06-11 NOTE — Progress Notes (Signed)
Ensure pre surgery drink given with instructions to complete by [place], surgical soap given with instructions, pt verbalized understanding. EKG reviewed by Dr. Kalman Shan, will proceed with surgery as scheduled.

## 2018-06-14 ENCOUNTER — Encounter: Payer: Self-pay | Admitting: Family Medicine

## 2018-06-18 ENCOUNTER — Encounter (HOSPITAL_BASED_OUTPATIENT_CLINIC_OR_DEPARTMENT_OTHER)
Admission: RE | Disposition: A | Discharge status: Home / Self Care | Payer: Self-pay | Source: Ambulatory Visit | Attending: Plastic Surgery

## 2018-06-18 ENCOUNTER — Ambulatory Visit (HOSPITAL_BASED_OUTPATIENT_CLINIC_OR_DEPARTMENT_OTHER): Payer: Medicare Other | Admitting: Anesthesiology

## 2018-06-18 ENCOUNTER — Encounter (HOSPITAL_BASED_OUTPATIENT_CLINIC_OR_DEPARTMENT_OTHER): Payer: Self-pay | Admitting: *Deleted

## 2018-06-18 ENCOUNTER — Ambulatory Visit (HOSPITAL_BASED_OUTPATIENT_CLINIC_OR_DEPARTMENT_OTHER)
Admission: RE | Admit: 2018-06-18 | Discharge: 2018-06-19 | Disposition: A | Discharge status: Home / Self Care | Payer: Medicare Other | Source: Ambulatory Visit | Attending: Plastic Surgery | Admitting: Plastic Surgery

## 2018-06-18 ENCOUNTER — Other Ambulatory Visit: Payer: Self-pay

## 2018-06-18 DIAGNOSIS — M793 Panniculitis, unspecified: Secondary | ICD-10-CM | POA: Diagnosis not present

## 2018-06-18 DIAGNOSIS — I10 Essential (primary) hypertension: Secondary | ICD-10-CM | POA: Insufficient documentation

## 2018-06-18 DIAGNOSIS — K219 Gastro-esophageal reflux disease without esophagitis: Secondary | ICD-10-CM | POA: Insufficient documentation

## 2018-06-18 DIAGNOSIS — Z79899 Other long term (current) drug therapy: Secondary | ICD-10-CM | POA: Insufficient documentation

## 2018-06-18 DIAGNOSIS — G4730 Sleep apnea, unspecified: Secondary | ICD-10-CM | POA: Insufficient documentation

## 2018-06-18 DIAGNOSIS — F419 Anxiety disorder, unspecified: Secondary | ICD-10-CM | POA: Diagnosis not present

## 2018-06-18 DIAGNOSIS — F329 Major depressive disorder, single episode, unspecified: Secondary | ICD-10-CM | POA: Diagnosis not present

## 2018-06-18 DIAGNOSIS — I739 Peripheral vascular disease, unspecified: Secondary | ICD-10-CM | POA: Diagnosis not present

## 2018-06-18 DIAGNOSIS — Z7982 Long term (current) use of aspirin: Secondary | ICD-10-CM | POA: Diagnosis not present

## 2018-06-18 HISTORY — PX: PANNICULECTOMY: SHX5360

## 2018-06-18 SURGERY — PANNICULECTOMY
Anesthesia: General | Site: Abdomen

## 2018-06-18 MED ORDER — FENTANYL CITRATE (PF) 100 MCG/2ML IJ SOLN
INTRAMUSCULAR | Status: AC
Start: 2018-06-18 — End: ?
  Filled 2018-06-18: qty 2

## 2018-06-18 MED ORDER — GLYCOPYRROLATE 0.2 MG/ML IJ SOLN
INTRAMUSCULAR | Status: DC | PRN
  Administered 2018-06-18 (×4): 0.1 mg via INTRAVENOUS

## 2018-06-18 MED ORDER — PROPOFOL 500 MG/50ML IV EMUL
INTRAVENOUS | Status: AC
  Filled 2018-06-18: qty 50

## 2018-06-18 MED ORDER — MIDAZOLAM HCL 5 MG/5ML IJ SOLN
INTRAMUSCULAR | Status: DC | PRN
  Administered 2018-06-18: 2 mg via INTRAVENOUS

## 2018-06-18 MED ORDER — ENOXAPARIN SODIUM 40 MG/0.4ML ~~LOC~~ SOLN
40.0000 mg | SUBCUTANEOUS | Status: DC
  Administered 2018-06-19: 40 mg via SUBCUTANEOUS
  Filled 2018-06-18: qty 0.4

## 2018-06-18 MED ORDER — ONDANSETRON HCL 4 MG/2ML IJ SOLN: 4.0000 mg | Freq: Four times a day (QID) | INTRAMUSCULAR | Status: DC | PRN

## 2018-06-18 MED ORDER — AMLODIPINE BESYLATE 2.5 MG PO TABS
2.5000 mg | ORAL_TABLET | Freq: Every day | ORAL | Status: DC
  Administered 2018-06-18: 2.5 mg via ORAL

## 2018-06-18 MED ORDER — BUPROPION HCL ER (SR) 150 MG PO TB12
150.0000 mg | ORAL_TABLET | Freq: Two times a day (BID) | ORAL | Status: DC
  Administered 2018-06-18 (×2): 150 mg via ORAL

## 2018-06-18 MED ORDER — ATORVASTATIN CALCIUM 20 MG PO TABS
20.0000 mg | ORAL_TABLET | Freq: Every day | ORAL | Status: DC
  Administered 2018-06-18: 20 mg via ORAL

## 2018-06-18 MED ORDER — LORAZEPAM 1 MG PO TABS: 1.0000 mg | ORAL_TABLET | Freq: Two times a day (BID) | ORAL | Status: DC | PRN

## 2018-06-18 MED ORDER — FENTANYL CITRATE (PF) 100 MCG/2ML IJ SOLN
50.0000 ug | INTRAMUSCULAR | Status: DC | PRN
  Administered 2018-06-18 (×2): 50 ug via INTRAVENOUS

## 2018-06-18 MED ORDER — MIDAZOLAM HCL 2 MG/2ML IJ SOLN
INTRAMUSCULAR | Status: AC
  Filled 2018-06-18: qty 2

## 2018-06-18 MED ORDER — CEFAZOLIN SODIUM-DEXTROSE 2-4 GM/100ML-% IV SOLN
2.0000 g | Freq: Three times a day (TID) | INTRAVENOUS | Status: AC
  Administered 2018-06-18 (×2): 2 g via INTRAVENOUS
  Filled 2018-06-18 (×2): qty 100

## 2018-06-18 MED ORDER — SUCCINYLCHOLINE CHLORIDE 20 MG/ML IJ SOLN
INTRAMUSCULAR | Status: DC | PRN
  Administered 2018-06-18: 50 mg via INTRAVENOUS

## 2018-06-18 MED ORDER — LUBIPROSTONE 24 MCG PO CAPS
24.0000 ug | ORAL_CAPSULE | Freq: Two times a day (BID) | ORAL | Status: DC
  Administered 2018-06-18: 24 ug via ORAL

## 2018-06-18 MED ORDER — ALBUTEROL SULFATE HFA 108 (90 BASE) MCG/ACT IN AERS: 2.0000 | INHALATION_SPRAY | Freq: Four times a day (QID) | RESPIRATORY_TRACT | Status: DC | PRN

## 2018-06-18 MED ORDER — MIDAZOLAM HCL 2 MG/2ML IJ SOLN: 1.0000 mg | INTRAMUSCULAR | Status: DC | PRN

## 2018-06-18 MED ORDER — HYDROCODONE-ACETAMINOPHEN 5-325 MG PO TABS
1.0000 | ORAL_TABLET | ORAL | Status: DC | PRN
  Administered 2018-06-18: 2 via ORAL
  Filled 2018-06-18: qty 2

## 2018-06-18 MED ORDER — GLYCOPYRROLATE PF 0.2 MG/ML IJ SOSY
PREFILLED_SYRINGE | INTRAMUSCULAR | Status: AC
  Filled 2018-06-18: qty 1

## 2018-06-18 MED ORDER — LACTATED RINGERS IV SOLN
INTRAVENOUS | Status: DC
  Administered 2018-06-18 (×3): via INTRAVENOUS

## 2018-06-18 MED ORDER — VENLAFAXINE HCL ER 150 MG PO CP24: 150.0000 mg | ORAL_CAPSULE | Freq: Every day | ORAL | Status: DC

## 2018-06-18 MED ORDER — 0.9 % SODIUM CHLORIDE (POUR BTL) OPTIME
TOPICAL | Status: DC | PRN
  Administered 2018-06-18: 500 mL

## 2018-06-18 MED ORDER — PROPOFOL 10 MG/ML IV BOLUS
INTRAVENOUS | Status: DC | PRN
  Administered 2018-06-18: 50 mg via INTRAVENOUS
  Administered 2018-06-18: 150 mg via INTRAVENOUS
  Administered 2018-06-18: 30 mg via INTRAVENOUS

## 2018-06-18 MED ORDER — DEXAMETHASONE SODIUM PHOSPHATE 10 MG/ML IJ SOLN
INTRAMUSCULAR | Status: AC
  Filled 2018-06-18: qty 1

## 2018-06-18 MED ORDER — ONDANSETRON 4 MG PO TBDP: 4.0000 mg | ORAL_TABLET | Freq: Four times a day (QID) | ORAL | Status: DC | PRN

## 2018-06-18 MED ORDER — BUPIVACAINE LIPOSOME 1.3 % IJ SUSP
INTRAMUSCULAR | Status: DC | PRN
  Administered 2018-06-18: 40 mL

## 2018-06-18 MED ORDER — FENTANYL CITRATE (PF) 100 MCG/2ML IJ SOLN
INTRAMUSCULAR | Status: AC
  Filled 2018-06-18: qty 2

## 2018-06-18 MED ORDER — CEFAZOLIN SODIUM-DEXTROSE 2-4 GM/100ML-% IV SOLN
2.0000 g | INTRAVENOUS | Status: AC
  Administered 2018-06-18: 2 g via INTRAVENOUS

## 2018-06-18 MED ORDER — CHLORHEXIDINE GLUCONATE CLOTH 2 % EX PADS: 6.0000 | MEDICATED_PAD | Freq: Once | CUTANEOUS | Status: DC

## 2018-06-18 MED ORDER — FENTANYL CITRATE (PF) 100 MCG/2ML IJ SOLN: 25.0000 ug | INTRAMUSCULAR | Status: DC | PRN

## 2018-06-18 MED ORDER — LOSARTAN POTASSIUM 50 MG PO TABS
100.0000 mg | ORAL_TABLET | Freq: Every day | ORAL | Status: DC
  Administered 2018-06-18: 100 mg via ORAL

## 2018-06-18 MED ORDER — KCL IN DEXTROSE-NACL 20-5-0.45 MEQ/L-%-% IV SOLN
INTRAVENOUS | Status: DC
  Administered 2018-06-18: 12:00:00 via INTRAVENOUS
  Filled 2018-06-18: qty 1000

## 2018-06-18 MED ORDER — DEXAMETHASONE SODIUM PHOSPHATE 4 MG/ML IJ SOLN
INTRAMUSCULAR | Status: DC | PRN
  Administered 2018-06-18: 10 mg via INTRAVENOUS

## 2018-06-18 MED ORDER — ONDANSETRON HCL 4 MG/2ML IJ SOLN
INTRAMUSCULAR | Status: DC | PRN
  Administered 2018-06-18: 4 mg via INTRAVENOUS

## 2018-06-18 MED ORDER — OXYCODONE HCL 5 MG/5ML PO SOLN: 5.0000 mg | Freq: Once | ORAL | Status: DC | PRN

## 2018-06-18 MED ORDER — PHENYLEPHRINE HCL 10 MG/ML IJ SOLN
INTRAMUSCULAR | Status: AC
  Filled 2018-06-18: qty 1

## 2018-06-18 MED ORDER — KETOROLAC TROMETHAMINE 15 MG/ML IJ SOLN
15.0000 mg | Freq: Three times a day (TID) | INTRAMUSCULAR | Status: AC
  Administered 2018-06-18 – 2018-06-19 (×3): 15 mg via INTRAVENOUS
  Filled 2018-06-18 (×3): qty 1

## 2018-06-18 MED ORDER — HEPARIN SODIUM (PORCINE) 5000 UNIT/ML IJ SOLN: 5000.0000 [IU] | INTRAMUSCULAR | Status: AC

## 2018-06-18 MED ORDER — HYDROMORPHONE HCL 1 MG/ML IJ SOLN: 0.5000 mg | INTRAMUSCULAR | Status: DC | PRN

## 2018-06-18 MED ORDER — LIDOCAINE HCL (CARDIAC) PF 100 MG/5ML IV SOSY
PREFILLED_SYRINGE | INTRAVENOUS | Status: AC
  Filled 2018-06-18: qty 5

## 2018-06-18 MED ORDER — HEPARIN SODIUM (PORCINE) 5000 UNIT/ML IJ SOLN
5000.0000 [IU] | Freq: Once | INTRAMUSCULAR | Status: AC
  Administered 2018-06-18: 5000 [IU] via SUBCUTANEOUS

## 2018-06-18 MED ORDER — FENTANYL CITRATE (PF) 100 MCG/2ML IJ SOLN
INTRAMUSCULAR | Status: DC | PRN
  Administered 2018-06-18: 25 ug via INTRAVENOUS
  Administered 2018-06-18: 50 ug via INTRAVENOUS
  Administered 2018-06-18 (×3): 25 ug via INTRAVENOUS
  Administered 2018-06-18: 50 ug via INTRAVENOUS
  Administered 2018-06-18 (×3): 25 ug via INTRAVENOUS

## 2018-06-18 MED ORDER — EPHEDRINE SULFATE 50 MG/ML IJ SOLN
INTRAMUSCULAR | Status: DC | PRN
  Administered 2018-06-18 (×2): 25 mg via INTRAVENOUS

## 2018-06-18 MED ORDER — OXYCODONE HCL 5 MG PO TABS: 5.0000 mg | ORAL_TABLET | Freq: Once | ORAL | Status: DC | PRN

## 2018-06-18 MED ORDER — LIDOCAINE HCL (CARDIAC) PF 100 MG/5ML IV SOSY
PREFILLED_SYRINGE | INTRAVENOUS | Status: DC | PRN
  Administered 2018-06-18: 30 mg via INTRAVENOUS

## 2018-06-18 MED ORDER — HEPARIN SODIUM (PORCINE) 5000 UNIT/ML IJ SOLN
INTRAMUSCULAR | Status: AC
Start: 2018-06-18 — End: ?
  Filled 2018-06-18: qty 1

## 2018-06-18 MED ORDER — CARVEDILOL 12.5 MG PO TABS
12.5000 mg | ORAL_TABLET | Freq: Two times a day (BID) | ORAL | Status: DC
  Administered 2018-06-18: 12.5 mg via ORAL

## 2018-06-18 MED ORDER — CEFAZOLIN SODIUM-DEXTROSE 2-4 GM/100ML-% IV SOLN
INTRAVENOUS | Status: AC
  Filled 2018-06-18: qty 100

## 2018-06-18 MED ORDER — ONDANSETRON HCL 4 MG/2ML IJ SOLN
INTRAMUSCULAR | Status: AC
  Filled 2018-06-18: qty 2

## 2018-06-18 MED ORDER — SCOPOLAMINE 1 MG/3DAYS TD PT72: 1.0000 | MEDICATED_PATCH | Freq: Once | TRANSDERMAL | Status: DC | PRN

## 2018-06-18 MED ORDER — PANTOPRAZOLE SODIUM 40 MG PO TBEC: 40.0000 mg | DELAYED_RELEASE_TABLET | Freq: Every day | ORAL | Status: DC

## 2018-06-18 SURGICAL SUPPLY — 61 items
ADH SKN CLS APL DERMABOND .7 (GAUZE/BANDAGES/DRESSINGS) ×1
APPLIER CLIP 9.375 MED OPEN (MISCELLANEOUS) ×9
APR CLP MED 9.3 20 MLT OPN (MISCELLANEOUS) ×3
BAG DECANTER FOR FLEXI CONT (MISCELLANEOUS) IMPLANT
BINDER ABDOMINAL 12 ML 46-62 (SOFTGOODS) ×3 IMPLANT
BINDER ABDOMINAL 12 XL 75-84 (SOFTGOODS) ×3 IMPLANT
BLADE CLIPPER SURG (BLADE) ×3 IMPLANT
BLADE SURG 10 STRL SS (BLADE) ×6 IMPLANT
BLADE SURG 11 STRL SS (BLADE) IMPLANT
BLADE SURG 15 STRL LF DISP TIS (BLADE) IMPLANT
BLADE SURG 15 STRL SS (BLADE)
CANISTER SUCTION 2500CC (MISCELLANEOUS) ×3 IMPLANT
CHLORAPREP W/TINT 26ML (MISCELLANEOUS) ×6 IMPLANT
CLIP APPLIE 9.375 MED OPEN (MISCELLANEOUS) ×3 IMPLANT
COVER BACK TABLE 60X90IN (DRAPES) ×3 IMPLANT
COVER MAYO STAND STRL (DRAPES) ×3 IMPLANT
DERMABOND ADVANCED (GAUZE/BANDAGES/DRESSINGS) ×2
DERMABOND ADVANCED .7 DNX12 (GAUZE/BANDAGES/DRESSINGS) ×1 IMPLANT
DRAIN CHANNEL 15F RND FF W/TCR (WOUND CARE) ×6 IMPLANT
DRAIN CHANNEL 19F RND (DRAIN) IMPLANT
DRAPE TOP ARMCOVERS (MISCELLANEOUS) ×3 IMPLANT
DRAPE U-SHAPE 76X120 STRL (DRAPES) ×3 IMPLANT
DRSG PAD ABDOMINAL 8X10 ST (GAUZE/BANDAGES/DRESSINGS) ×11 IMPLANT
ELECT BLADE 4.0 EZ CLEAN MEGAD (MISCELLANEOUS)
ELECT COATED BLADE 2.86 ST (ELECTRODE) IMPLANT
ELECT REM PT RETURN 9FT ADLT (ELECTROSURGICAL) ×3
ELECTRODE BLDE 4.0 EZ CLN MEGD (MISCELLANEOUS) IMPLANT
ELECTRODE REM PT RTRN 9FT ADLT (ELECTROSURGICAL) ×1 IMPLANT
EVACUATOR SILICONE 100CC (DRAIN) ×6 IMPLANT
GAUZE SPONGE 4X4 12PLY STRL (GAUZE/BANDAGES/DRESSINGS) IMPLANT
GAUZE XEROFORM 1X8 LF (GAUZE/BANDAGES/DRESSINGS) IMPLANT
GLOVE BIO SURGEON STRL SZ 6 (GLOVE) ×6 IMPLANT
GLOVE BIOGEL PI IND STRL 7.0 (GLOVE) ×3 IMPLANT
GLOVE BIOGEL PI INDICATOR 7.0 (GLOVE) ×6
GLOVE SURG SS PI 6.5 STRL IVOR (GLOVE) ×4 IMPLANT
GOWN STRL REUS W/ TWL LRG LVL3 (GOWN DISPOSABLE) ×2 IMPLANT
GOWN STRL REUS W/TWL LRG LVL3 (GOWN DISPOSABLE) ×6
NEEDLE HYPO 25X1 1.5 SAFETY (NEEDLE) ×3 IMPLANT
NS IRRIG 1000ML POUR BTL (IV SOLUTION) ×3 IMPLANT
PACK BASIN DAY SURGERY FS (CUSTOM PROCEDURE TRAY) ×3 IMPLANT
PENCIL BUTTON HOLSTER BLD 10FT (ELECTRODE) ×3 IMPLANT
PIN SAFETY STERILE (MISCELLANEOUS) ×3 IMPLANT
SHEET MEDIUM DRAPE 40X70 STRL (DRAPES) ×3 IMPLANT
SLEEVE SCD COMPRESS KNEE MED (MISCELLANEOUS) ×3 IMPLANT
SPONGE LAP 18X18 RF (DISPOSABLE) ×6 IMPLANT
STAPLER VISISTAT 35W (STAPLE) ×6 IMPLANT
SUT ETHILON 2 0 FS 18 (SUTURE) ×6 IMPLANT
SUT MNCRL AB 4-0 PS2 18 (SUTURE) ×9 IMPLANT
SUT PDS AB 0 CT 36 (SUTURE) ×12 IMPLANT
SUT PLAIN 5 0 P 3 18 (SUTURE) IMPLANT
SUT VLOC 180 0 24IN GS25 (SUTURE) ×6 IMPLANT
SYR BULB IRRIGATION 50ML (SYRINGE) ×3 IMPLANT
SYR CONTROL 10ML LL (SYRINGE) ×3 IMPLANT
TOWEL GREEN STERILE FF (TOWEL DISPOSABLE) ×9 IMPLANT
TRAY FOL W/BAG SLVR 16FR STRL (SET/KITS/TRAYS/PACK) IMPLANT
TRAY FOLEY W/BAG SLVR 14FR LF (SET/KITS/TRAYS/PACK) ×3 IMPLANT
TRAY FOLEY W/BAG SLVR 16FR LF (SET/KITS/TRAYS/PACK)
TUBE CONNECTING 20'X1/4 (TUBING) ×1
TUBE CONNECTING 20X1/4 (TUBING) ×2 IMPLANT
UNDERPAD 30X30 (UNDERPADS AND DIAPERS) ×6 IMPLANT
YANKAUER SUCT BULB TIP NO VENT (SUCTIONS) ×3 IMPLANT

## 2018-06-18 NOTE — Anesthesia Procedure Notes (Signed)
Procedure Name: Intubation Date/Time: 06/18/2018 7:21 AM Performed by: Marrianne Mood, CRNA Pre-anesthesia Checklist: Patient identified, Emergency Drugs available, Suction available, Patient being monitored and Timeout performed Patient Re-evaluated:Patient Re-evaluated prior to induction Oxygen Delivery Method: Circle system utilized Preoxygenation: Pre-oxygenation with 100% oxygen Induction Type: IV induction Ventilation: Mask ventilation without difficulty Laryngoscope Size: Glidescope, Mac and 3 Grade View: Grade II Tube type: Oral Tube size: 7.0 mm Number of attempts: 1 Airway Equipment and Method: Stylet and Oral airway Placement Confirmation: ETT inserted through vocal cords under direct vision,  positive ETCO2 and breath sounds checked- equal and bilateral Secured at: 21 cm Tube secured with: Tape Dental Injury: Teeth and Oropharynx as per pre-operative assessment

## 2018-06-18 NOTE — Progress Notes (Signed)
Patient in restroom when noticed small amount of bright red blood on floor and when patient wiped. Patient taken back to bed and assessed incisions with Shara Blazing, RN. Small amount of drainage noted on right side. Drainage also on abd pads. OR nurse Roselyn Reef applied dermabond to incision. Incision not draining at this time. Will continue to keep close watch. Dr. Para Skeans nurse notified. Katharine Look called back to inform that if patient has anymore problems to please call Dr. Iran Planas. Will continue to keep patient safe, comfortable, and follow MD orders. [name], [name]

## 2018-06-18 NOTE — H&P (Addendum)
Subjective:     Patient ID: Tamara [name] is a 67 y.o. female.  HPI Tamara [name] is a 67 yo female here for pre operative history and physical prior to a panniculectomy.  History: Referred by Dr. Excell Seltzer . Highest wt 330 lb. Underwent lap sleeve gastrectomy 2017 and stable at current weight for 4 months. Goal less than 200 lb. Patient presents with recurrent yeast infections, intertrigo beneath abdominal panniculus and over thighs for over 6 months duration. These occur over once per month and continue despite oral diflucan and three different topical medications. Reports she was induced for knee surgery but then awoken without undergoing surgery due to presence rash over thighs and knees.  PastMedicalHistory      Past Medical History:  Diagnosis Date  . Adrenal adenoma    no endocrine function per chart review, seen at [facility] 2.2019  . Anxiety   . Essential hypertension 06/18/2007   Overview:  Qualifier: Diagnosis of  By: Sherren Mocha MD, Jory Ee   Last Assessment & Plan:  Blood pressure elevated to 204/94 while at the [facility] today.  Confirmed the patient does take all of her blood pressure medications as previously directed.  Patient states she is always very anxious when she comes to the [place].  Advised patient to check her blood pressure when she arrives back home; and let us know if her blood pressure remains elevated.  Marland Kitchen GERD (gastroesophageal reflux disease)   . Hearing loss   . History of ductal carcinoma in situ (DCIS) of breast   . History of therapeutic radiation   . HLD (hyperlipidemia)   . Monoallelic mutation of MITF gene   . Obesity (BMI 35.0-39.9 without comorbidity)   . RLS (restless legs syndrome)   . Sleep apnea 02/16/2009   Overview:  PSG 05/2017 showed AHI of 8/hour with lowest desaturation of 87%.- 220 lbs    Last Assessment & Plan:  Home sleep study to assess whether weight loss is helping with sleep apnea.  Trial of air fit F  30 fullface mask next time you are due for mask change  Weight loss encouraged, compliance with goal of at least 4-6 hrs every night is the expectation. Advised against medications with sedative side effects Cautioned against driving when sleepy - understanding that sleepiness will vary on a day to day basis     PastSurgicalHistory       Past Surgical History:  Procedure Laterality Date  . BREAST LUMPECTOMY Left 2015   with reexcision for margins, DCIS  . gastric sleeve  01/28/2016  . INNER EAR SURGERY    . TOTAL KNEE ARTHROPLASTY Right   . TUBAL LIGATION       No Known Allergies   Current Outpatient Prescriptions:  .  amLODIPine (NORVASC) 2.5 MG tablet, TAKE 1 TABLET BY MOUTH DAILY, Disp: , Rfl:  .  anastrozole (ARIMIDEX) 1 mg tablet, Take 1 mg by mouth., Disp: , Rfl:  .  aspirin 81 MG EC tablet *ANTIPLATELET*, Take 81 mg by mouth., Disp: , Rfl:  .  atorvastatin (LIPITOR) 20 MG tablet, Take 20 mg by mouth., Disp: , Rfl:  .  buPROPion SR (WELLBUTRIN SR) 150 MG 12 hr tablet, Take 150 mg by mouth., Disp: , Rfl:  .  CALCIUM CARBONATE-VITAMIN D2 ORAL, Take by mouth., Disp: , Rfl:  .  carvedilol (COREG) 12.5 MG tablet, Take 12.5 mg by mouth., Disp: , Rfl:  .  clindamycin (CLEOCIN-T) 1 % gel, Apply topically 2 (  two) times daily., Disp: , Rfl:  .  cloNIDine (CATAPRES-TTS-1) 0.1 mg/24 hr, clonidine 0.1 mg/24 hr weekly transdermal patch, Disp: , Rfl:  .  cyanocobalamin, vitamin B-12, 1,000 mcg/15 mL Liqd, Take 5 mLs by mouth., Disp: , Rfl:  .  diphenhydrAMINE-acetaminophen (TYLENOL PM) 25-500 mg Tab, Take by mouth., Disp: , Rfl:  .  fluticasone propionate (CUTIVATE) 0.05 % cream, Apply 1 application topically as needed (rash)., Disp: , Rfl:  .  furosemide (LASIX) 40 MG tablet, Take 20 mg by mouth., Disp: , Rfl:  .  HYDROcodone-acetaminophen (NORCO) 5-325 mg per tablet, Take by mouth., Disp: , Rfl:  .  ketoconazole (NIZORAL) 2 % cream, Apply 1 application topically daily as needed  (rash)., Disp: , Rfl:  .  linaCLOtide (LINACLOTIDE) 290 mcg capsule, Take 290 mcg by mouth., Disp: , Rfl:  .  LORazepam (ATIVAN) 1 MG tablet, TAKE ONE TABLET BY MOUTH TWICE DAILY AS NEEDED, Disp: , Rfl:  .  losartan (COZAAR) 100 MG tablet, TAKE 1 TABLET BY MOUTH ONCE DAILY, Disp: , Rfl:  .  melatonin 5 mg TbIE, Take by mouth., Disp: , Rfl:  .  nystatin (MYCOSTATIN) 100,000 unit/gram cream, nystatin 100,000 unit/gram topical cream, Disp: , Rfl:  .  pantoprazole (PROTONIX) 40 MG tablet, Take 40 mg by mouth., Disp: , Rfl:  .  pediatric multivitamin with iron (FLINTSTONES COMPLETE) Chew chewable tablet, Take by mouth., Disp: , Rfl:  .  venlafaxine (EFFEXOR-XR) 150 MG 24 hr capsule, TAKE 1 CAPSULE BY MOUTH DAILY WITH BREAKFAST, Disp: , Rfl:  .  albuterol (VENTOLIN HFA) 90 mcg/actuation inhaler, Inhale into the lungs., Disp: , Rfl:   Social : Patient lives alone but has 3 kids and their spouses in area to help care for her. Non smoker, no ETOH or illicit drugs.  The following portions of the patient's history were reviewed and updated as appropriate: allergies, current medications, past family history, past medical history, past social history, past surgical history and problem list.  Review of Systems  HENT: Positive for dental problem and hearing loss.        Uses hearing aids Has periodontal disease and considering dentures but no current infection.   Eyes:       Wears glasses  Respiratory:       Had been using CPAP, but has been re-evaluated and may be able to stop using  Cardiovascular: Positive for leg swelling. Negative for chest pain and palpitations.  Gastrointestinal: Positive for constipation.  Genitourinary: Negative for difficulty urinating and dysuria.  Musculoskeletal: Positive for joint swelling.       Taking Norco for OA of knees on regular basis.   Skin:       Recurrent yeast rashes in past but none recently  Neurological: Negative.   Hematological: Bruises/bleeds easily.   Psychiatric/Behavioral: Negative.        Objective:   Physical Exam  Constitutional: She is oriented to person, place, and time. She appears well-developed and well-nourished. No distress.  Cardiovascular: Normal rate, regular rhythm, normal heart sounds and intact distal pulses.  Exam reveals no gallop and no friction rub.   No murmur heard. Pulmonary/Chest: Effort normal and breath sounds normal. No stridor. No respiratory distress. She has no wheezes. She has no rales.  Abdominal: Soft. Bowel sounds are normal. She exhibits no distension. There is no tenderness.  Large pannus that extends below pubic symphysis, redundant tissue extends superior to umbilicus no hernia appreciated. No current rashes or breakdown.  Assessment:     1. Panniculitis     History of gastric sleeve     Plan:     Plan panniculectomy: The risk that can be encountered with or after an panniculectomy were discussed and include the following but not limited to these: asymmetry, fluid accumulation, firmness of the tissue, skin loss, decrease or no sensation, fat necrosis, bleeding, infection, healing delay.  Deep vein thrombosis, cardiac and pulmonary complications are risks to any procedure.  There are risks of anesthesia, changes to skin sensation and injury to nerves or blood vessels.  The muscle can be temporarily or permanently injured.  You may have an allergic reaction to tape, suture, glue, blood products which can result in skin discoloration, swelling, pain, skin lesions, poor healing.  Any of these can lead to the need for revisonal surgery or stage procedures.  Weight gain and weigh loss can also effect the long term appearance. The results are not guaranteed to last a lifetime.  Future surgery may be required.   We discussed the need for drains, compression garment, possible changes in urine stream. We discussed that she may develop difficulty healing, seromas and possibly need further  surgery. Reviewed panniculectomy will only address anterior abdomen, scar from hip to hip. Reviewed area of redundancy and would plan resection umbilicus and not reconstructed.  Reviewed procedure will lift mons, may affect stream urine or change sexual intercourse. Discussed this procedure is not equivalent to abdominoplasty. The patient's questions were answered and she desires to proceed and consent was obtained.  She does receive a every 30 day prescription for Norco from her PCP and we discussed no additional prescription post op. Instructed to stop Baby ASA 1 week prior to surgery. ASA is prophylaxis only, no vascular events per patient.  Prescriptions for Keflex and Zofran ordered this visit and patient instructed these are for the post operative period .      Irene Limbo, MD [facility] [phone], pin [phone]

## 2018-06-18 NOTE — Anesthesia Postprocedure Evaluation (Signed)
Anesthesia Post Note  Patient: Tamara [name]  Procedure(s) Performed: PANNICULECTOMY (N/A Abdomen)     Patient location during evaluation: PACU Anesthesia Type: General Level of consciousness: awake and alert Pain management: pain level controlled Vital Signs Assessment: post-procedure vital signs reviewed and stable Respiratory status: spontaneous breathing, nonlabored ventilation, respiratory function stable and patient connected to nasal cannula oxygen Cardiovascular status: blood pressure returned to baseline and stable Postop Assessment: no apparent nausea or vomiting Anesthetic complications: no    Last Vitals:  Vitals:   06/18/18 1300 06/18/18 1445  BP:    Pulse:  (!) 58  Resp: 16 14  Temp:    SpO2: 100% 100%    Last Pain:  Vitals:   06/18/18 1445  TempSrc:   PainSc: 0-No pain                 Sholanda Croson S

## 2018-06-18 NOTE — OR Nursing (Signed)
6935.8 gram pannus excised. Discarded per order of Dr. Iran Planas.

## 2018-06-18 NOTE — Anesthesia Preprocedure Evaluation (Signed)
Anesthesia Evaluation  Patient identified by MRN, date of birth, ID band Patient awake    Reviewed: Allergy & Precautions, H&P , NPO status , Patient's Chart, lab work & pertinent test results  Airway Mallampati: II   Neck ROM: full    Dental   Pulmonary asthma , sleep apnea ,    breath sounds clear to auscultation       Cardiovascular hypertension, + Peripheral Vascular Disease   Rhythm:regular Rate:Normal     Neuro/Psych PSYCHIATRIC DISORDERS Anxiety Depression    GI/Hepatic GERD  ,  Endo/Other  obese  Renal/GU      Musculoskeletal  (+) Arthritis ,   Abdominal   Peds  Hematology   Anesthesia Other Findings   Reproductive/Obstetrics                             Anesthesia Physical Anesthesia Plan  ASA: II  Anesthesia Plan: General   Post-op Pain Management:    Induction: Intravenous  PONV Risk Score and Plan: 3 and Ondansetron, Dexamethasone, Midazolam and Treatment may vary due to age or medical condition  Airway Management Planned: Oral ETT  Additional Equipment:   Intra-op Plan:   Post-operative Plan: Extubation in OR  Informed Consent: I have reviewed the patients History and Physical, chart, labs and discussed the procedure including the risks, benefits and alternatives for the proposed anesthesia with the patient or authorized representative who has indicated his/her understanding and acceptance.     Plan Discussed with: CRNA, Anesthesiologist and Surgeon  Anesthesia Plan Comments:         Anesthesia Quick Evaluation

## 2018-06-18 NOTE — Discharge Instructions (Signed)

## 2018-06-18 NOTE — Op Note (Signed)
Operative Note   DATE OF OPERATION: 7.19.19  LOCATION: [facility]-observation  SURGICAL DIVISION: Plastic Surgery  PREOPERATIVE DIAGNOSES:  Panniculitis  POSTOPERATIVE DIAGNOSES:  same  PROCEDURE:  Panniculectomy  SURGEON: Irene Limbo MD MBA  ASSISTANT: none  ANESTHESIA:  General.   EBL: 75 ml  COMPLICATIONS: None immediate.   INDICATIONS FOR PROCEDURE:  The patient, Tamara [name], is a 67 y.o. female born on Aug 26, 1951, is here for panniculectomy in setting chronic panniculitis thighs and abdomen.   FINDINGS: Soft tissue resection 6936 g, including resection umbilicus  DESCRIPTION OF PROCEDURE:  The patient's operative site was marked with the patient in the preoperative area.  The patient was taken to the operating room. SCDs were placed and IV antibiotics and SQ heparin were given. Foley catheter placed. The patient's operative site was prepped and draped in a sterile fashion. A time out was performed and all information was confirmed to be correct. Low transverse incision made inferior to panniculus. Incision carried through superficial fascia down to abdominal wall. Sub- scarpas dissection completed toward costal margin. Care taken to preserve layer of fatty tissue over underlying abdominal wall fascia.Umbilicus resected and figure of eight suture 2-0 PDS used to close fascia at base of umbilical stalk. Superior extent for resection marked by palpation through elevated skin-subcutaneous tissue. Panniculus resected. Wounds irrigated and hemostasis obtained. 15 Fr drain placed percutaneously bilaterally and secured with 2-0 nylon. Exparel injected along skin margins and abdominal wall fascia. Patient tailor tacked closed. Low transverse incision closed in layers with 0-PDS in Scarpas fascia. V-lock 0 suture used to approximate dermis. 4-0 monocryl subcuticular used for skin closure.Tissue adhesive applied followed by dry dressing, abdominal binder.   The patient was  allowed to wake from anesthesia, extubated and taken to the recovery room in satisfactory condition.   SPECIMENS: none  DRAINS: 15 Fr JP in right and left subcutaneous abdomen  Irene Limbo, MD [facility] Plastic & Reconstructive Surgery [phone], pin [phone]

## 2018-06-18 NOTE — Transfer of Care (Signed)
Immediate Anesthesia Transfer of Care Note  Patient: Tamara [name]  Procedure(s) Performed: PANNICULECTOMY (N/A Abdomen)  Patient Location: PACU  Anesthesia Type:General  Level of Consciousness: awake and patient cooperative  Airway & Oxygen Therapy: Patient Spontanous Breathing and Patient connected to face mask oxygen  Post-op Assessment: Report given to RN and Post -op Vital signs reviewed and stable  Post vital signs: Reviewed and stable  Last Vitals:  Vitals Value Taken Time  BP    Temp    Pulse    Resp 23 06/18/2018 10:11 AM  SpO2    Vitals shown include unvalidated device data.  Last Pain:  Vitals:   06/18/18 0647  TempSrc: Oral  PainSc: 0-No pain         Complications: No apparent anesthesia complications

## 2018-06-19 DIAGNOSIS — F419 Anxiety disorder, unspecified: Secondary | ICD-10-CM | POA: Diagnosis not present

## 2018-06-19 DIAGNOSIS — I10 Essential (primary) hypertension: Secondary | ICD-10-CM | POA: Diagnosis not present

## 2018-06-19 DIAGNOSIS — M793 Panniculitis, unspecified: Secondary | ICD-10-CM | POA: Diagnosis not present

## 2018-06-19 DIAGNOSIS — F329 Major depressive disorder, single episode, unspecified: Secondary | ICD-10-CM | POA: Diagnosis not present

## 2018-06-19 DIAGNOSIS — I739 Peripheral vascular disease, unspecified: Secondary | ICD-10-CM | POA: Diagnosis not present

## 2018-06-19 DIAGNOSIS — G4730 Sleep apnea, unspecified: Secondary | ICD-10-CM | POA: Diagnosis not present

## 2018-06-19 NOTE — Discharge Summary (Signed)
Physician Discharge Summary  Patient ID: Tamara [name] MRN: [id number] DOB/AGE: [birth year] 67 y.o.  Admit date: 06/18/2018 Discharge date: 06/19/2018  Admission Diagnoses: Panniculitis  Discharge Diagnoses:  Active Problems:   Panniculitis   Discharged Condition: stable  Hospital Course: Post operatively patient did well ambulating, voiding without difficulty. Instructed on drain care. Patient pain controlled with PO Norco which is home medication, no additional prescription for this given.  Treatments: surgery: panniculectomy 7.19.19  Disposition: Discharge disposition: 01-Home or Self Care       Discharge Instructions    Call MD for:  redness, tenderness, or signs of infection (pain, swelling, bleeding, redness, odor or green/yellow discharge around incision site)   Complete by:  As directed    Call MD for:  temperature >100.5   Complete by:  As directed    Discharge instructions   Complete by:  As directed    Ok to remove dressings and shower am 7.20.19. Soap and water ok, pat incisions dry. No creams or ointments over incisions. Do not let drains dangle in shower, attach to lanyard or similar.Strip and record drains twice daily and bring log to clinic visit.  Abdominal binder atl other times; ok to leave open to air post shower.  No house yard work or exercise until cleared by MD. Madaline Brilliant to use ibuprofen as directed to aid with pain control.  Patient has all medications- no additional Rx   Driving Restrictions   Complete by:  As directed    No driving through follow up visit   Lifting restrictions   Complete by:  As directed    No lifting > 5 lbs for 2 weeks.   Resume previous diet   Complete by:  As directed       Follow-up Information    Irene Limbo, MD In 1 week.   Specialty:  Plastic Surgery Why:  as scheduled Contact information: [address] [place] [id number] [phone]           Signed: Irene Limbo 06/19/2018,  8:06 AM

## 2018-06-21 ENCOUNTER — Encounter (HOSPITAL_BASED_OUTPATIENT_CLINIC_OR_DEPARTMENT_OTHER): Payer: Self-pay | Admitting: Plastic Surgery

## 2018-06-24 ENCOUNTER — Other Ambulatory Visit: Payer: Self-pay | Admitting: Family Medicine

## 2018-06-28 DIAGNOSIS — M4854XA Collapsed vertebra, not elsewhere classified, thoracic region, initial encounter for fracture: Secondary | ICD-10-CM | POA: Diagnosis not present

## 2018-06-28 DIAGNOSIS — M546 Pain in thoracic spine: Secondary | ICD-10-CM | POA: Diagnosis not present

## 2018-06-28 DIAGNOSIS — M25572 Pain in left ankle and joints of left foot: Secondary | ICD-10-CM | POA: Diagnosis not present

## 2018-06-28 DIAGNOSIS — S93402A Sprain of unspecified ligament of left ankle, initial encounter: Secondary | ICD-10-CM | POA: Diagnosis not present

## 2018-07-13 DIAGNOSIS — S22000A Wedge compression fracture of unspecified thoracic vertebra, initial encounter for closed fracture: Secondary | ICD-10-CM | POA: Insufficient documentation

## 2018-07-13 DIAGNOSIS — M5136 Other intervertebral disc degeneration, lumbar region: Secondary | ICD-10-CM | POA: Diagnosis not present

## 2018-07-13 DIAGNOSIS — M4854XD Collapsed vertebra, not elsewhere classified, thoracic region, subsequent encounter for fracture with routine healing: Secondary | ICD-10-CM | POA: Diagnosis not present

## 2018-07-13 DIAGNOSIS — M519 Unspecified thoracic, thoracolumbar and lumbosacral intervertebral disc disorder: Secondary | ICD-10-CM | POA: Diagnosis not present

## 2018-07-13 DIAGNOSIS — M48061 Spinal stenosis, lumbar region without neurogenic claudication: Secondary | ICD-10-CM | POA: Diagnosis not present

## 2018-07-21 DIAGNOSIS — M4854XD Collapsed vertebra, not elsewhere classified, thoracic region, subsequent encounter for fracture with routine healing: Secondary | ICD-10-CM | POA: Diagnosis not present

## 2018-08-03 DIAGNOSIS — M48061 Spinal stenosis, lumbar region without neurogenic claudication: Secondary | ICD-10-CM | POA: Diagnosis not present

## 2018-08-03 DIAGNOSIS — M546 Pain in thoracic spine: Secondary | ICD-10-CM | POA: Diagnosis not present

## 2018-08-03 DIAGNOSIS — M5136 Other intervertebral disc degeneration, lumbar region: Secondary | ICD-10-CM | POA: Diagnosis not present

## 2018-08-03 DIAGNOSIS — M519 Unspecified thoracic, thoracolumbar and lumbosacral intervertebral disc disorder: Secondary | ICD-10-CM | POA: Diagnosis not present

## 2018-08-23 ENCOUNTER — Encounter: Payer: Self-pay | Admitting: Family Medicine

## 2018-08-24 ENCOUNTER — Other Ambulatory Visit: Payer: Self-pay | Admitting: Family Medicine

## 2018-08-24 MED ORDER — SCOPOLAMINE 1 MG/3DAYS TD PT72: 1.0000 | MEDICATED_PATCH | TRANSDERMAL | 0 refills | Status: AC

## 2018-09-02 ENCOUNTER — Encounter: Payer: Self-pay | Admitting: Family Medicine

## 2018-09-02 ENCOUNTER — Other Ambulatory Visit: Payer: Self-pay | Admitting: Family Medicine

## 2018-09-02 DIAGNOSIS — K219 Gastro-esophageal reflux disease without esophagitis: Secondary | ICD-10-CM

## 2018-09-06 ENCOUNTER — Other Ambulatory Visit: Payer: Self-pay | Admitting: General Surgery

## 2018-09-06 DIAGNOSIS — Z9889 Other specified postprocedural states: Secondary | ICD-10-CM

## 2018-09-10 ENCOUNTER — Encounter: Payer: Self-pay | Admitting: Family Medicine

## 2018-09-10 ENCOUNTER — Ambulatory Visit (INDEPENDENT_AMBULATORY_CARE_PROVIDER_SITE_OTHER): Payer: Medicare Other | Admitting: Family Medicine

## 2018-09-10 VITALS — BP 130/84 | HR 60 | Temp 98.0°F | Resp 12 | Ht 66.0 in | Wt 206.1 lb

## 2018-09-10 DIAGNOSIS — M159 Polyosteoarthritis, unspecified: Secondary | ICD-10-CM

## 2018-09-10 DIAGNOSIS — R296 Repeated falls: Secondary | ICD-10-CM | POA: Diagnosis not present

## 2018-09-10 DIAGNOSIS — F329 Major depressive disorder, single episode, unspecified: Secondary | ICD-10-CM

## 2018-09-10 DIAGNOSIS — R42 Dizziness and giddiness: Secondary | ICD-10-CM | POA: Diagnosis not present

## 2018-09-10 DIAGNOSIS — K5900 Constipation, unspecified: Secondary | ICD-10-CM | POA: Diagnosis not present

## 2018-09-10 DIAGNOSIS — Z23 Encounter for immunization: Secondary | ICD-10-CM | POA: Diagnosis not present

## 2018-09-10 DIAGNOSIS — G894 Chronic pain syndrome: Secondary | ICD-10-CM | POA: Diagnosis not present

## 2018-09-10 DIAGNOSIS — F419 Anxiety disorder, unspecified: Secondary | ICD-10-CM | POA: Diagnosis not present

## 2018-09-10 MED ORDER — BUPROPION HCL ER (SR) 150 MG PO TB12: 150.0000 mg | ORAL_TABLET | Freq: Every day | ORAL | 0 refills | Status: DC

## 2018-09-10 MED ORDER — MECLIZINE HCL 25 MG PO TABS: 25.0000 mg | ORAL_TABLET | Freq: Every evening | ORAL | 0 refills | Status: AC | PRN

## 2018-09-10 NOTE — Assessment & Plan Note (Signed)
In general the pain is adequately controlled. We discussed side effects of chronic opioid use. So far she has tolerated medication well, no changes today. Med contract was signed in 05/2017, we will plan on new med contract next visit.

## 2018-09-10 NOTE — Progress Notes (Signed)
HPI:   Tamara [name] is a 67 y.o. female, who is here today for 3 months follow up.   She was last seen on 06/11/18.  Since her last OV she has undergone panniculectomy, 06/18/2018. She recovered well.   Chronic pain: Lower back pain generalized OA. She is following with orthopedist.  According to patient, recently she had cervical and lumbar MRI, she was diagnosed with rupture disc. Cervical and lower back, shoulders, hips, and knees pain exacerbated by prolonged standing and walking. Alleviated by rest. Pain is worse when she first gets up in the morning, associated stiffness, achy-like pain 9/10. Independent ADLs except for transfer, now she is using a cane.  Currently she is on hydrocodone-acetaminophen 5-325 mg twice daily as needed. She increased frequency of the medication while she was recovering from surgery but now she is back to her baseline.   Constipation: Last visit Linzess was discontinued and Amitiza was started. She is having bowel movements daily. She is tolerating Amitiza well, she seems to be doing better with this medication, sometimes she needs to take MiraLAX. No abdominal pain, nausea, vomiting, blood in the stool, or melena.    She is complaining of being irritable for the past few days. She denies depressed mood or thoughts. Currently she is on Effexor XR 150 mg daily and Wellbutrin SR 150 mg bid. She is also on Lorazepam 1 mg bid prn. Tolerating meds well,no side effects reported.  She is also c/o episodes of dizziness,exacerbated by head movement and getting up from bed. She has had "a lot" since her last OV. Last fall 2 weeks ago, she was going up [place] balance,wnet backwards and landed on her back. She followed with ortho.  No hearing loss,tinnitus,or headache. Problem has improved. She has had intermittent symptoms for a while.  She has not tried OTC medication.  Review of Systems  Constitutional: Negative for activity  change, appetite change, fatigue and fever.  HENT: Negative for mouth sores, nosebleeds and trouble swallowing.   Eyes: Negative for redness and visual disturbance.  Respiratory: Negative for cough, shortness of breath and wheezing.   Cardiovascular: Negative for chest pain, palpitations and leg swelling.  Gastrointestinal: Negative for abdominal pain, nausea and vomiting.       Negative for changes in bowel habits.  Genitourinary: Negative for decreased urine volume, dysuria and hematuria.  Musculoskeletal: Positive for arthralgias, back pain and gait problem.  Neurological: Positive for dizziness. Negative for syncope, weakness and headaches.  Psychiatric/Behavioral: Negative for confusion and suicidal ideas. The patient is nervous/anxious.      Current Outpatient Medications on File Prior to Visit  Medication Sig Dispense Refill  . albuterol (VENTOLIN HFA) 108 (90 Base) MCG/ACT inhaler Inhale 2 puffs into the lungs every 6 (six) hours as needed for wheezing or shortness of breath. 36 each 2  . amLODipine (NORVASC) 2.5 MG tablet TAKE 1 TABLET BY MOUTH DAILY 90 tablet 2  . anastrozole (ARIMIDEX) 1 MG tablet Take 1 tablet (1 mg total) by mouth daily. 90 tablet 3  . aspirin EC 81 MG tablet Take 81 mg by mouth at bedtime.    Marland Kitchen atorvastatin (LIPITOR) 20 MG tablet TAKE 1 TABLET BY MOUTH ONCE DAILY AT 6 PM. 90 tablet 1  . Calcium Carbonate-Vitamin D (CALCIUM PLUS VITAMIN D PO) Take 1 tablet by mouth daily.    . carvedilol (COREG) 12.5 MG tablet Take 1 tablet (12.5 mg total) by mouth 2 (two) times daily with a  meal. 180 tablet 2  . cephALEXin (KEFLEX) 500 MG capsule Take by mouth.     . clindamycin (CLINDAGEL) 1 % gel Apply topically 2 (two) times daily. 30 g 0  . Cyanocobalamin (VITAMIN B-12) 1000 MCG/15ML LIQD Take 5 mLs by mouth daily.    . diphenhydramine-acetaminophen (TYLENOL PM) 25-500 MG TABS Take 2 tablets by mouth at bedtime as needed (for sleep).     . flintstones complete  (FLINTSTONES) 60 MG chewable tablet Chew 1 tablet by mouth daily.    . fluconazole (DIFLUCAN) 150 MG tablet Take one tablet by mount daily for 3 days may repeat if symptoms recur 30 tablet 1  . fluticasone (CUTIVATE) 0.05 % cream Apply 1 application topically as needed (rash). 30 g 1  . furosemide (LASIX) 40 MG tablet Take 0.5 tablets (20 mg total) by mouth daily as needed for edema. 90 tablet 2  . HYDROcodone-acetaminophen (NORCO/VICODIN) 5-325 MG tablet Take 1 tablet by mouth every 6 (six) hours as needed for moderate pain (dose changed to treat postsurgical pain.). 120 tablet 0  . ketoconazole (NIZORAL) 2 % cream Apply 1 application topically daily as needed (rash). 15 g 4  . LORazepam (ATIVAN) 1 MG tablet TAKE ONE TABLET BY MOUTH TWICE DAILY AS NEEDED (Patient taking differently: Take 1 tablet by mouth twice daily as needed for anxiety) 60 tablet 0  . losartan (COZAAR) 100 MG tablet TAKE 1 TABLET BY MOUTH ONCE DAILY 90 tablet 1  . lubiprostone (AMITIZA) 24 MCG capsule Take 1 capsule (24 mcg total) by mouth 2 (two) times daily with a meal. 60 capsule 2  . Melatonin ER 5 MG TBCR Take 1 tablet by mouth at bedtime. 90 tablet 3  . nystatin cream (MYCOSTATIN) nystatin 100,000 unit/gram topical cream    . ondansetron (ZOFRAN-ODT) 4 MG disintegrating tablet DISSOLVE 1 TABLET IN MOUTH EVERY 8 HOURS AS NEEDED FOR NAUSEA FOR UP TO 7 DAYS, have not started yet, will start after skin surgery  0  . pantoprazole (PROTONIX) 40 MG tablet TAKE 1 TABLET BY MOUTH ONCE DAILY 90 tablet 1  . venlafaxine XR (EFFEXOR-XR) 150 MG 24 hr capsule TAKE 1 CAPSULE BY MOUTH DAILY WITH BREAKFAST 90 capsule 3   No current facility-administered medications on file prior to visit.      Past Medical History:  Diagnosis Date  . Arthritis    knees   . Asthma    rare;only when around alot of dust-Ventolin inhaler as needed  . Breast cancer ([place])     left. States she did not have lymph nodes removed  . Bursitis of right  shoulder   . Cellulitis and abscess of leg   . Complication of anesthesia    was told 01/06/14 that airway was small  . Depression    takes Effexor and Wellbutrin daily  . Hearing loss   . History of bronchitis 1966  . Hyperlipidemia   . Hypertension    takes Coreg and Losartan daily  . Joint pain   . Joint swelling   . Obese   . Peripheral edema    takes Furosemide daily as needed and Potassium daily  . Personal history of radiation therapy 2015  . Radiation 03/08/14-04/26/14   50.4 gray to left breast. Lumpectomy cavity boosted to 64.4 gray  . Restless legs syndrome    takes depakote  . Sleep apnea, obstructive    uses CPAP  . Wears glasses    No Known Allergies  Social History   Socioeconomic  History  . Marital status: Divorced    Spouse name: Not on file  . Number of children: 3  . Years of education: Not on file  . Highest education level: Not on file  Occupational History  . Occupation: Retail banker: [employer]  Social Needs  . Financial resource strain: Not on file  . Food insecurity:    Worry: Not on file    Inability: Not on file  . Transportation needs:    Medical: Not on file    Non-medical: Not on file  Tobacco Use  . Smoking status: Never Smoker  . Smokeless tobacco: Never Used  Substance and Sexual Activity  . Alcohol use: No  . Drug use: No  . Sexual activity: Never    Birth control/protection: Post-menopausal  Lifestyle  . Physical activity:    Days per week: Not on file    Minutes per session: Not on file  . Stress: Not on file  Relationships  . Social connections:    Talks on phone: Not on file    Gets together: Not on file    Attends religious service: Not on file    Active member of club or organization: Not on file    Attends meetings of clubs or organizations: Not on file    Relationship status: Not on file  Other Topics Concern  . Not on file  Social History Narrative  . Not on file    Vitals:    09/10/18 1352  BP: 130/84  Pulse: 60  Resp: 12  Temp: 98 F (36.7 C)  SpO2: 96%   Body mass index is 33.27 kg/m.  Physical Exam  Nursing note and vitals reviewed. Constitutional: She is oriented to person, place, and time. She appears well-developed. No distress.  HENT:  Head: Normocephalic and atraumatic.  Right Ear: Tympanic membrane, external ear and ear canal normal. Decreased hearing is noted.  Left Ear: Tympanic membrane, external ear and ear canal normal. Decreased hearing is noted.  Mouth/Throat: Oropharynx is clear and moist and mucous membranes are normal.  + Hearing aids.  Eyes: Pupils are equal, round, and reactive to light. Conjunctivae are normal.  Cardiovascular: Normal rate and regular rhythm.  No murmur heard. DP present bilateral.  Respiratory: Effort normal and breath sounds normal. No respiratory distress.  GI: Soft. She exhibits no mass. There is no hepatomegaly. There is no tenderness.  Musculoskeletal: She exhibits no edema.       Thoracic back: She exhibits no tenderness and no bony tenderness.       Lumbar back: She exhibits no tenderness and no bony tenderness.  Lymphadenopathy:    She has no cervical adenopathy.  Neurological: She is alert and oriented to person, place, and time. She has normal strength. No cranial nerve deficit.  Antalgic, stable gait assisted with a cane.  Skin: Skin is warm. No rash noted. No erythema.  Psychiatric: Her mood appears anxious.  Well groomed, good eye contact.      ASSESSMENT AND PLAN:   Tamara [name] was seen today for 3 months follow-up.  Orders Placed This Encounter  Procedures  . Flu vaccine HIGH DOSE PF     Frequent falls Fall prevention education provided. Some of her meds can increase risk. She is not interested in PT,states that she is going to meet with a trainer and start exercising in a couple weeks.   Vertigo Hx suggest positional vertigo,not reproducible today. Instructed about  warning signs. Side effects of Meclizine discussed. Vestibular therapy could also be arranged,she prefers to hold on this for now. F/U in 3 months.  - meclizine (ANTIVERT) 25 MG tablet; Take 1 tablet (25 mg total) by mouth at bedtime as needed for up to 15 days for dizziness.  Dispense: 15 tablet; Refill: 0  Encounter for immunization - Flu vaccine HIGH DOSE PF  Anxiety and depression Depression seems to be well controlled. Because reporting irritation, recommend decreasing Wellbutrin SR from 150 mg twice daily 250 mg daily. No changes in rest of her medications. Follow-up in 3 months.  Chronic pain disorder In general the pain is adequately controlled. We discussed side effects of chronic opioid use. So far she has tolerated medication well, no changes today. Med contract was signed in 05/2017, we will plan on new med contract next visit.    Constipation Better controlled with Amitiza, so no changes. Continue MiraLAX daily as needed. Adequate fiber and fluid intake.      Jahaan Vanwagner G. [place], MD  [facility]. [place] office.

## 2018-09-10 NOTE — Patient Instructions (Addendum)
A few things to remember from today's visit:   Constipation, unspecified constipation type  Chronic pain disorder  Anxiety and depression  Frequent falls  Dizziness is a perception of movement, it is sometimes difficult to describe and can be  caused by different problems, most benign but others can be life threaten.  Vertigo is the most common cause of dizziness, usually related with inner ear and can be associated with nausea, vomiting, and unbalance sensation. It can be complicated by falls due to lose of balance; so fall precautions are very important.  Most of the time dizziness is benign, usually intermittent, last a few seconds at the time and aggravated by certain positions. It usually resolves in a few weeks without residual effect but it could be recurrent.  Sometimes blood work is ordered to evaluate for other possible causes.  Dizziness can also be caused by certain medications, dehydration, migraines, and strokes.  Medication prescribed for vertigo, Meclizine, causes drowsiness/sleepiness, so frequently I recommended taking it at bedtime.  We could arrange vestibular exercises through physical therapy if you continue having episode of dizziness. If you have another episode of syncope we may need to have you seen cardiologist and/or neurologist.   Seek immediate medical attention if: New severe headache, dobble vision, fever (100 F or more), associated numbness/tingling, focal weakness, persistent vomiting, not able to walk, or sudden worsening symptoms.   Please be sure medication list is accurate. If a new problem present, please set up appointment sooner than planned today.

## 2018-09-10 NOTE — Assessment & Plan Note (Signed)
Depression seems to be well controlled. Because reporting irritation, recommend decreasing Wellbutrin SR from 150 mg twice daily 250 mg daily. No changes in rest of her medications. Follow-up in 3 months.

## 2018-09-10 NOTE — Assessment & Plan Note (Signed)
Better controlled with Amitiza, so no changes. Continue MiraLAX daily as needed. Adequate fiber and fluid intake.

## 2018-09-12 MED ORDER — HYDROCODONE-ACETAMINOPHEN 5-325 MG PO TABS: 1.0000 | ORAL_TABLET | Freq: Two times a day (BID) | ORAL | 0 refills | Status: DC | PRN

## 2018-09-21 ENCOUNTER — Ambulatory Visit
Admission: RE | Admit: 2018-09-21 | Discharge: 2018-09-21 | Disposition: A | Discharge status: Home / Self Care | Payer: Medicare Other | Source: Ambulatory Visit | Attending: General Surgery | Admitting: General Surgery

## 2018-09-21 DIAGNOSIS — Z853 Personal history of malignant neoplasm of breast: Secondary | ICD-10-CM | POA: Diagnosis not present

## 2018-09-21 DIAGNOSIS — Z9889 Other specified postprocedural states: Secondary | ICD-10-CM

## 2018-09-21 DIAGNOSIS — R928 Other abnormal and inconclusive findings on diagnostic imaging of breast: Secondary | ICD-10-CM | POA: Diagnosis not present

## 2018-10-03 ENCOUNTER — Encounter: Payer: Self-pay | Admitting: Family Medicine

## 2018-10-04 ENCOUNTER — Other Ambulatory Visit: Payer: Self-pay | Admitting: *Deleted

## 2018-10-04 ENCOUNTER — Telehealth: Payer: Self-pay

## 2018-10-04 DIAGNOSIS — F419 Anxiety disorder, unspecified: Secondary | ICD-10-CM

## 2018-10-04 DIAGNOSIS — F329 Major depressive disorder, single episode, unspecified: Secondary | ICD-10-CM

## 2018-10-04 DIAGNOSIS — I10 Essential (primary) hypertension: Secondary | ICD-10-CM

## 2018-10-04 DIAGNOSIS — K5900 Constipation, unspecified: Secondary | ICD-10-CM

## 2018-10-04 MED ORDER — CARVEDILOL 12.5 MG PO TABS: 12.5000 mg | ORAL_TABLET | Freq: Two times a day (BID) | ORAL | 2 refills | Status: DC

## 2018-10-04 MED ORDER — BUPROPION HCL ER (SR) 150 MG PO TB12: 150.0000 mg | ORAL_TABLET | Freq: Every day | ORAL | 0 refills | Status: DC

## 2018-10-04 MED ORDER — ATORVASTATIN CALCIUM 20 MG PO TABS: ORAL_TABLET | ORAL | 1 refills | Status: DC

## 2018-10-04 MED ORDER — LUBIPROSTONE 24 MCG PO CAPS: 24.0000 ug | ORAL_CAPSULE | Freq: Two times a day (BID) | ORAL | 2 refills | Status: DC

## 2018-10-04 NOTE — Telephone Encounter (Signed)
Pt called [facility] to advise she is changing pharmacies from [facility] to [facility]. The phone number to the [facility] is [phone].

## 2018-10-04 NOTE — Telephone Encounter (Signed)
Updated pharmacy in [facility].

## 2018-10-06 ENCOUNTER — Other Ambulatory Visit: Payer: Self-pay | Admitting: Family Medicine

## 2018-10-06 DIAGNOSIS — Z780 Asymptomatic menopausal state: Secondary | ICD-10-CM

## 2018-10-06 MED ORDER — MECLIZINE HCL 25 MG PO TABS: 25.0000 mg | ORAL_TABLET | Freq: Every day | ORAL | 0 refills | Status: DC | PRN

## 2018-10-06 NOTE — Progress Notes (Signed)
DEXA 

## 2018-10-20 ENCOUNTER — Other Ambulatory Visit: Payer: Self-pay

## 2018-11-04 ENCOUNTER — Ambulatory Visit
Admission: RE | Admit: 2018-11-04 | Discharge: 2018-11-04 | Disposition: A | Discharge status: Home / Self Care | Payer: Medicare Other | Source: Ambulatory Visit | Attending: Family Medicine | Admitting: Family Medicine

## 2018-11-04 DIAGNOSIS — M8589 Other specified disorders of bone density and structure, multiple sites: Secondary | ICD-10-CM | POA: Diagnosis not present

## 2018-11-04 DIAGNOSIS — Z780 Asymptomatic menopausal state: Secondary | ICD-10-CM | POA: Diagnosis not present

## 2018-11-09 ENCOUNTER — Encounter: Payer: Self-pay | Admitting: Family Medicine

## 2018-11-16 ENCOUNTER — Other Ambulatory Visit: Payer: Self-pay | Admitting: Family Medicine

## 2018-12-02 ENCOUNTER — Other Ambulatory Visit: Payer: Self-pay | Admitting: *Deleted

## 2018-12-02 DIAGNOSIS — R600 Localized edema: Secondary | ICD-10-CM

## 2018-12-02 MED ORDER — FUROSEMIDE 40 MG PO TABS: 20.0000 mg | ORAL_TABLET | Freq: Every day | ORAL | 2 refills | Status: DC | PRN

## 2018-12-13 ENCOUNTER — Encounter: Payer: Self-pay | Admitting: Family Medicine

## 2018-12-13 ENCOUNTER — Encounter: Payer: Self-pay | Admitting: *Deleted

## 2018-12-13 ENCOUNTER — Ambulatory Visit (INDEPENDENT_AMBULATORY_CARE_PROVIDER_SITE_OTHER): Payer: Medicare Other | Admitting: Family Medicine

## 2018-12-13 ENCOUNTER — Ambulatory Visit (INDEPENDENT_AMBULATORY_CARE_PROVIDER_SITE_OTHER): Payer: Medicare Other

## 2018-12-13 VITALS — BP 140/72 | HR 56 | Temp 98.3°F | Resp 12 | Ht 66.0 in | Wt 214.0 lb

## 2018-12-13 DIAGNOSIS — F329 Major depressive disorder, single episode, unspecified: Secondary | ICD-10-CM

## 2018-12-13 DIAGNOSIS — M25562 Pain in left knee: Secondary | ICD-10-CM

## 2018-12-13 DIAGNOSIS — E559 Vitamin D deficiency, unspecified: Secondary | ICD-10-CM | POA: Diagnosis not present

## 2018-12-13 DIAGNOSIS — F419 Anxiety disorder, unspecified: Secondary | ICD-10-CM

## 2018-12-13 DIAGNOSIS — M159 Polyosteoarthritis, unspecified: Secondary | ICD-10-CM | POA: Diagnosis not present

## 2018-12-13 DIAGNOSIS — I10 Essential (primary) hypertension: Secondary | ICD-10-CM

## 2018-12-13 DIAGNOSIS — G894 Chronic pain syndrome: Secondary | ICD-10-CM

## 2018-12-13 DIAGNOSIS — M1712 Unilateral primary osteoarthritis, left knee: Secondary | ICD-10-CM | POA: Diagnosis not present

## 2018-12-13 MED ORDER — VITAMIN D 125 MCG (5000 UT) PO CAPS: 1.0000 | ORAL_CAPSULE | Freq: Every day | ORAL | 2 refills | Status: DC

## 2018-12-13 NOTE — Assessment & Plan Note (Signed)
Stable. No changes in current management. Medication contract signed for Xanax. Follow-up in 3 months.

## 2018-12-13 NOTE — Progress Notes (Signed)
HPI:   Tamara [name] is a 68 y.o. female, who is here today for 3 months follow up on pain management.   She was last seen on 09/10/2017.  History of lower back pain and generalized OA, mainly knees.  Left knee is "really painful."  Left knee feels like "locking up" for the past couple months.  It happened suddenly while she is walking. She has not noted knee edema or erythema.  Pain can be as bad as 10/10, intermittently, exacerbated by prolonged standing/walking and alleviated by rest. When taking hydrocodone pain goes down to 3-4/10.  Currently she is on hydrocodone-acetaminophen 5-325 mg twice daily as needed, 40 tablets/month. She is tolerating medication well. Constipation is well controlled with Amitiza.  She is able to function and complete chores during the day. She has follow-up with orthopedist in the past.  Since her last visit she has had 1 fall when she was trying to put the leash on her dog, she tangled and fell.  No serious injury, so she is not sick medical attention.  Anxiety and depression: Currently she is on lorazepam 1 mg twice daily as needed, Effexor ER 150 mg daily, and Wellbutrin SR 150 mg daily. She is tolerating medication well. In general she is feeling the medication is still helping with symptoms. She denies depressed mood or suicidal thoughts.     Hypertension:  Currently on Cozaar 100 mg daily. She takes furosemide 20 mg daily as needed for lower extremity edema. She is taking medications as instructed, no side effects reported.  She has not noted unusual headache, visual changes, exertional chest pain, dyspnea,  focal weakness, or worsening edema.   Lab Results  Component Value Date   CREATININE 0.79 06/11/2018   BUN 21 06/11/2018   NA 141 06/11/2018   K 3.5 06/11/2018   CL 109 06/11/2018   CO2 24 06/11/2018    She is also requesting refill for vitamin D 5000 units, which he takes daily. History of vitamin D  deficiency. Last 25 OH vitamin D in 01/2017 was 46.   Review of Systems  Constitutional: Negative for activity change, appetite change, fatigue and fever.  HENT: Negative for mouth sores, nosebleeds and trouble swallowing.   Eyes: Negative for redness and visual disturbance.  Respiratory: Negative for cough, shortness of breath and wheezing.   Cardiovascular: Negative for chest pain, palpitations and leg swelling.  Gastrointestinal: Negative for abdominal pain, nausea and vomiting.       Negative for changes in bowel habits.  Genitourinary: Negative for decreased urine volume and hematuria.  Musculoskeletal: Positive for arthralgias, back pain and gait problem.  Neurological: Negative for syncope, weakness and headaches.  Psychiatric/Behavioral: Positive for sleep disturbance. Negative for confusion. The patient is nervous/anxious.      Current Outpatient Medications on File Prior to Visit  Medication Sig Dispense Refill  . albuterol (VENTOLIN HFA) 108 (90 Base) MCG/ACT inhaler Inhale 2 puffs into the lungs every 6 (six) hours as needed for wheezing or shortness of breath. 36 each 2  . amLODipine (NORVASC) 2.5 MG tablet TAKE 1 TABLET BY MOUTH DAILY 90 tablet 2  . anastrozole (ARIMIDEX) 1 MG tablet Take 1 tablet (1 mg total) by mouth daily. 90 tablet 3  . aspirin EC 81 MG tablet Take 81 mg by mouth at bedtime.    Marland Kitchen atorvastatin (LIPITOR) 20 MG tablet TAKE 1 TABLET BY MOUTH ONCE DAILY AT 6 PM. 90 tablet 1  . buPROPion [facility]  SR) 150 MG 12 hr tablet Take 1 tablet (150 mg total) by mouth daily. 90 tablet 0  . Calcium Carbonate-Vitamin D (CALCIUM PLUS VITAMIN D PO) Take 1 tablet by mouth daily.    . carvedilol (COREG) 12.5 MG tablet Take 1 tablet (12.5 mg total) by mouth 2 (two) times daily with a meal. 180 tablet 2  . cephALEXin (KEFLEX) 500 MG capsule Take by mouth.     . clindamycin (CLINDAGEL) 1 % gel Apply topically 2 (two) times daily. 30 g 0  . Cyanocobalamin (VITAMIN B-12) 1000  MCG/15ML LIQD Take 5 mLs by mouth daily.    . diphenhydramine-acetaminophen (TYLENOL PM) 25-500 MG TABS Take 2 tablets by mouth at bedtime as needed (for sleep).     . flintstones complete (FLINTSTONES) 60 MG chewable tablet Chew 1 tablet by mouth daily.    . fluconazole (DIFLUCAN) 150 MG tablet Take one tablet by mount daily for 3 days may repeat if symptoms recur 30 tablet 1  . fluticasone (CUTIVATE) 0.05 % cream Apply 1 application topically as needed (rash). 30 g 1  . furosemide (LASIX) 40 MG tablet Take 0.5 tablets (20 mg total) by mouth daily as needed for edema. 45 tablet 2  . HYDROcodone-acetaminophen (NORCO/VICODIN) 5-325 MG tablet Take 1 tablet by mouth every 12 (twelve) hours as needed for moderate pain (dose changed to treat postsurgical pain.). 40 tablet 0  . ketoconazole (NIZORAL) 2 % cream Apply 1 application topically daily as needed (rash). 15 g 4  . LORazepam (ATIVAN) 1 MG tablet TAKE ONE TABLET BY MOUTH TWICE DAILY AS NEEDED (Patient taking differently: Take 1 tablet by mouth twice daily as needed for anxiety) 60 tablet 0  . losartan (COZAAR) 100 MG tablet TAKE 1 TABLET BY MOUTH ONCE DAILY 90 tablet 1  . lubiprostone (AMITIZA) 24 MCG capsule Take 1 capsule (24 mcg total) by mouth 2 (two) times daily with a meal. 60 capsule 2  . meclizine (ANTIVERT) 25 MG tablet TAKE 1 TABLET (25 MG TOTAL) BY MOUTH DAILY AS NEEDED FOR DIZZINESS. 30 tablet 0  . Melatonin ER 5 MG TBCR Take 1 tablet by mouth at bedtime. 90 tablet 3  . nystatin cream (MYCOSTATIN) nystatin 100,000 unit/gram topical cream    . ondansetron (ZOFRAN-ODT) 4 MG disintegrating tablet DISSOLVE 1 TABLET IN MOUTH EVERY 8 HOURS AS NEEDED FOR NAUSEA FOR UP TO 7 DAYS, have not started yet, will start after skin surgery  0  . pantoprazole (PROTONIX) 40 MG tablet TAKE 1 TABLET BY MOUTH ONCE DAILY 90 tablet 1  . venlafaxine XR (EFFEXOR-XR) 150 MG 24 hr capsule TAKE 1 CAPSULE BY MOUTH DAILY WITH BREAKFAST 90 capsule 3   No current  facility-administered medications on file prior to visit.      Past Medical History:  Diagnosis Date  . Arthritis    knees   . Asthma    rare;only when around alot of dust-Ventolin inhaler as needed  . Breast cancer ([place])     left. States she did not have lymph nodes removed  . Bursitis of right shoulder   . Cellulitis and abscess of leg   . Complication of anesthesia    was told 01/06/14 that airway was small  . Depression    takes Effexor and Wellbutrin daily  . Hearing loss   . History of bronchitis 1966  . Hyperlipidemia   . Hypertension    takes Coreg and Losartan daily  . Joint pain   . Joint swelling   .  Obese   . Peripheral edema    takes Furosemide daily as needed and Potassium daily  . Personal history of radiation therapy 2015  . Radiation 03/08/14-04/26/14   50.4 gray to left breast. Lumpectomy cavity boosted to 64.4 gray  . Restless legs syndrome    takes depakote  . Sleep apnea, obstructive    uses CPAP  . Wears glasses    No Known Allergies  Social History   Socioeconomic History  . Marital status: Divorced    Spouse name: Not on file  . Number of children: 3  . Years of education: Not on file  . Highest education level: Not on file  Occupational History  . Occupation: Retail banker: [employer]  Social Needs  . Financial resource strain: Not on file  . Food insecurity:    Worry: Not on file    Inability: Not on file  . Transportation needs:    Medical: Not on file    Non-medical: Not on file  Tobacco Use  . Smoking status: Never Smoker  . Smokeless tobacco: Never Used  Substance and Sexual Activity  . Alcohol use: No  . Drug use: No  . Sexual activity: Never    Birth control/protection: Post-menopausal  Lifestyle  . Physical activity:    Days per week: Not on file    Minutes per session: Not on file  . Stress: Not on file  Relationships  . Social connections:    Talks on phone: Not on file    Gets  together: Not on file    Attends religious service: Not on file    Active member of club or organization: Not on file    Attends meetings of clubs or organizations: Not on file    Relationship status: Not on file  Other Topics Concern  . Not on file  Social History Narrative  . Not on file    Vitals:   12/13/18 0947  BP: 140/72  Pulse: (!) 56  Resp: 12  Temp: 98.3 F (36.8 C)   Body mass index is 34.54 kg/m.   Physical Exam  Nursing note and vitals reviewed. Constitutional: She is oriented to person, place, and time. She appears well-developed. No distress.  HENT:  Head: Normocephalic and atraumatic.  Mouth/Throat: Oropharynx is clear and moist and mucous membranes are normal.  Eyes: Pupils are equal, round, and reactive to light. Conjunctivae are normal.  Cardiovascular: Regular rhythm. Bradycardia present.  No murmur heard. Pulses:      Dorsalis pedis pulses are 2+ on the right side and 2+ on the left side.  Respiratory: Effort normal and breath sounds normal. No respiratory distress.  GI: Soft. She exhibits no mass. There is no hepatomegaly. There is no abdominal tenderness.  Musculoskeletal:        General: No edema.     Lumbar back: She exhibits no tenderness and no bony tenderness.     Comments: Knee crepitus and limitation of flexion bilateral, L>R. Antalgic gait assisted with a cane.   Lymphadenopathy:    She has no cervical adenopathy.  Neurological: She is alert and oriented to person, place, and time. She has normal strength. No cranial nerve deficit. Gait abnormal.  Skin: Skin is warm. No rash noted. No erythema.  Psychiatric: She has a normal mood and affect.  Well groomed, good eye contact.      ASSESSMENT AND PLAN:   Ms. Hildagard Sobecki was seen today for 3  months follow-up.  Orders Placed This Encounter  Procedures  . DG Knee Complete 4 Views Left  . VITAMIN D 25 Hydroxy (Vit-D Deficiency, Fractures)  . Basic metabolic panel     Generalized osteoarthritis of multiple sites In general pain has been adequately controlled with hydrocodone. Low impact exercise recommended. No changes in current management.  Essential hypertension Adequately controlled. Mild bradycardia,asymptomstic. No changes in Cozaar 100 mg daily. Continue low-salt diet.  - Basic metabolic panel  Left knee pain, unspecified chronicity It has been worse for the past few days. Because she is reporting "locking" sensation, imaging ordered today to evaluate for intra-articular bodies. Further recommendation will be given accordingly. Recommend also following with her orthopedist. Fall precautions discussed.  - DG Knee Complete 4 Views Left; Future  Vitamin D deficiency No changes in current management, will follow labs done today and will give further recommendations accordingly.   Anxiety and depression Stable. No changes in current management. Medication contract signed for Xanax. Follow-up in 3 months.  Chronic pain disorder Urine tox ordered today. Medication contract signed today. Tolerating medication well. No changes in hydrocodone-acetaminophen dose.     Return in about 3 months (around 03/14/2019) for Pain manage and derpession.      Betty G. Martinique, MD  [facility]. [place] office.

## 2018-12-13 NOTE — Patient Instructions (Addendum)
A few things to remember from today's visit:   Generalized osteoarthritis of multiple sites  Chronic pain disorder  Anxiety and depression  Essential hypertension - Plan: Basic metabolic panel  Vitamin D deficiency - Plan: VITAMIN D 25 Hydroxy (Vit-D Deficiency, Fractures)  Left knee pain, unspecified chronicity  Fall prevention. Left knee range of motion exercises may help. Let me know if you decide about having physical therapy.  Please be sure medication list is accurate. If a new problem present, please set up appointment sooner than planned today.

## 2018-12-13 NOTE — Assessment & Plan Note (Signed)
No changes in current management, will follow labs done today and will give further recommendations accordingly.  

## 2018-12-13 NOTE — Assessment & Plan Note (Signed)
Urine tox ordered today. Medication contract signed today. Tolerating medication well. No changes in hydrocodone-acetaminophen dose.

## 2018-12-14 ENCOUNTER — Encounter: Payer: Self-pay | Admitting: Family Medicine

## 2018-12-20 ENCOUNTER — Other Ambulatory Visit: Payer: Self-pay | Admitting: Family Medicine

## 2018-12-20 DIAGNOSIS — I10 Essential (primary) hypertension: Secondary | ICD-10-CM

## 2018-12-21 ENCOUNTER — Other Ambulatory Visit: Payer: Self-pay | Admitting: Family Medicine

## 2018-12-22 MED ORDER — MECLIZINE HCL 25 MG PO TABS: 25.0000 mg | ORAL_TABLET | Freq: Every day | ORAL | 0 refills | Status: DC | PRN

## 2018-12-27 DIAGNOSIS — M1712 Unilateral primary osteoarthritis, left knee: Secondary | ICD-10-CM | POA: Diagnosis not present

## 2018-12-27 DIAGNOSIS — M25561 Pain in right knee: Secondary | ICD-10-CM | POA: Diagnosis not present

## 2018-12-30 ENCOUNTER — Other Ambulatory Visit: Payer: Self-pay | Admitting: Family Medicine

## 2018-12-30 DIAGNOSIS — K5900 Constipation, unspecified: Secondary | ICD-10-CM

## 2019-01-24 DIAGNOSIS — M1712 Unilateral primary osteoarthritis, left knee: Secondary | ICD-10-CM | POA: Diagnosis not present

## 2019-01-28 ENCOUNTER — Other Ambulatory Visit: Payer: Self-pay | Admitting: Family Medicine

## 2019-02-20 ENCOUNTER — Other Ambulatory Visit: Payer: Self-pay | Admitting: Family Medicine

## 2019-02-20 DIAGNOSIS — K219 Gastro-esophageal reflux disease without esophagitis: Secondary | ICD-10-CM

## 2019-02-21 ENCOUNTER — Other Ambulatory Visit: Payer: Self-pay | Admitting: Family Medicine

## 2019-02-21 DIAGNOSIS — K219 Gastro-esophageal reflux disease without esophagitis: Secondary | ICD-10-CM

## 2019-02-21 MED ORDER — PANTOPRAZOLE SODIUM 40 MG PO TBEC: 40.0000 mg | DELAYED_RELEASE_TABLET | Freq: Every day | ORAL | 1 refills | Status: DC

## 2019-02-25 ENCOUNTER — Other Ambulatory Visit: Payer: Self-pay | Admitting: Family Medicine

## 2019-02-25 ENCOUNTER — Telehealth: Payer: Self-pay | Admitting: Oncology

## 2019-02-25 DIAGNOSIS — M159 Polyosteoarthritis, unspecified: Secondary | ICD-10-CM

## 2019-02-25 NOTE — Telephone Encounter (Signed)
Scheduled April appointments per provider's request. Patient is aware of upcoming appointments.

## 2019-02-28 ENCOUNTER — Other Ambulatory Visit: Payer: Self-pay | Admitting: Family Medicine

## 2019-02-28 DIAGNOSIS — M159 Polyosteoarthritis, unspecified: Secondary | ICD-10-CM

## 2019-03-01 ENCOUNTER — Other Ambulatory Visit: Payer: Self-pay | Admitting: Family Medicine

## 2019-03-02 ENCOUNTER — Ambulatory Visit: Payer: Self-pay | Admitting: Oncology

## 2019-03-02 MED ORDER — HYDROCODONE-ACETAMINOPHEN 5-325 MG PO TABS: 1.0000 | ORAL_TABLET | Freq: Two times a day (BID) | ORAL | 0 refills | Status: DC | PRN

## 2019-03-02 NOTE — Telephone Encounter (Signed)
Last rx given on 10/13 for #40 with no ref

## 2019-03-08 DIAGNOSIS — Z6838 Body mass index (BMI) 38.0-38.9, adult: Secondary | ICD-10-CM | POA: Diagnosis not present

## 2019-03-08 DIAGNOSIS — Z01419 Encounter for gynecological examination (general) (routine) without abnormal findings: Secondary | ICD-10-CM | POA: Diagnosis not present

## 2019-03-08 DIAGNOSIS — N762 Acute vulvitis: Secondary | ICD-10-CM | POA: Diagnosis not present

## 2019-03-14 ENCOUNTER — Other Ambulatory Visit: Payer: Self-pay

## 2019-03-14 ENCOUNTER — Encounter: Payer: Self-pay | Admitting: Family Medicine

## 2019-03-14 ENCOUNTER — Ambulatory Visit (INDEPENDENT_AMBULATORY_CARE_PROVIDER_SITE_OTHER): Payer: Medicare Other | Admitting: Family Medicine

## 2019-03-14 VITALS — HR 60 | Resp 16

## 2019-03-14 DIAGNOSIS — M159 Polyosteoarthritis, unspecified: Secondary | ICD-10-CM | POA: Diagnosis not present

## 2019-03-14 DIAGNOSIS — F329 Major depressive disorder, single episode, unspecified: Secondary | ICD-10-CM | POA: Diagnosis not present

## 2019-03-14 DIAGNOSIS — G894 Chronic pain syndrome: Secondary | ICD-10-CM | POA: Diagnosis not present

## 2019-03-14 DIAGNOSIS — F419 Anxiety disorder, unspecified: Secondary | ICD-10-CM | POA: Diagnosis not present

## 2019-03-14 DIAGNOSIS — Z6836 Body mass index (BMI) 36.0-36.9, adult: Secondary | ICD-10-CM

## 2019-03-14 NOTE — Assessment & Plan Note (Addendum)
It is stable. Depression exacerbated by weight and limitations due to obesity,so I do not think increasing medication will help in this case. I think she will benefit greatly from psychotherapy. No changes in current management. Follow-up in 3 months, before if needed.

## 2019-03-14 NOTE — Assessment & Plan Note (Addendum)
Reporting 10 pounds gain weigh in the past 2 weeks. We discussed benefits of wt loss as well as adverse effects of obesity. Consistency with healthy diet and physical activity recommended. We discussed Dx and obesity behavior curve through the years. At this time I am not recommending pharmacologic treatment dur to risk of side effects and possible interaction with some of her meds.  She is interested in medical management of obesity.

## 2019-03-14 NOTE — Progress Notes (Signed)
Virtual Visit via Video Note   I connected with Tamara [name] on 03/14/19 at  9:30 AM EDT by a video enabled telemedicine application and verified that I am speaking with the correct person using two identifiers.  Location patient: home Location provider:work or home office Persons participating in the virtual visit: patient, provider  I discussed the limitations of evaluation and management by telemedicine and the availability of in person appointments. The patient expressed understanding and agreed to proceed.   HPI: Tamara [name] is being seen today for chronic disease management. Chronic pain and chronic opioid use, pain is localized mainly in right lower back and knees. Problem is exacerbated by vacuuming, mopping, washing dishes, prolonged standing, or prolonged walking. Alleviated by rest or positional changes. Currently she is on hydrocodone-acetaminophen 5-325 mg twice daily as needed. She is reporting pain has 9/10 "sometimes", she wakes up in the middle of the night with back pain.  She feels like hydrocodone is not helping as much as she would like because she is still having pain after activities that aggravated pain.  She denies abdominal pain, nausea, vomiting, changes in bowel habits, or urinary symptoms. # of tabs per month 40.  Depression and anxiety, currently she is on Effexor XR 150 mg daily and Wellbutrin SR 150 mg daily. She is reporting feeling depressed because she has not been able to maintain weight loss, she gained about 10 pounds in the past couple weeks. She is also on Ativan 1 mg twice daily as needed. Tolerated medication well, denies side effects.  Frustrated because she cannot wear the clothes she wants to. She was in a in a cruise with her family, she was all "covered up" because did not want people to noted extra skin. She denies suicidal thoughts.  S/P bariatric procedure, she follows with surgeon.  Weekly. She wonders if she can have a medication to  help with weight loss. She is not exercising regularly, aggravates knee pain. She is trying to be consistent with a healthful diet.   ROS: See pertinent positives and negatives per HPI.  Past Medical History:  Diagnosis Date  . Arthritis    knees   . Asthma    rare;only when around alot of dust-Ventolin inhaler as needed  . Breast cancer ([place])     left. States she did not have lymph nodes removed  . Bursitis of right shoulder   . Cellulitis and abscess of leg   . Complication of anesthesia    was told 01/06/14 that airway was small  . Depression    takes Effexor and Wellbutrin daily  . Hearing loss   . History of bronchitis 1966  . Hyperlipidemia   . Hypertension    takes Coreg and Losartan daily  . Joint pain   . Joint swelling   . Obese   . Peripheral edema    takes Furosemide daily as needed and Potassium daily  . Personal history of radiation therapy 2015  . Radiation 03/08/14-04/26/14   50.4 gray to left breast. Lumpectomy cavity boosted to 64.4 gray  . Restless legs syndrome    takes depakote  . Sleep apnea, obstructive    uses CPAP  . Wears glasses     Past Surgical History:  Procedure Laterality Date  . BREAST LUMPECTOMY Left 2015  . BREAST LUMPECTOMY WITH NEEDLE LOCALIZATION Left 01/24/2014   Procedure: BREAST LUMPECTOMY WITH NEEDLE LOCALIZATION;  Surgeon: Edward Jolly, MD;  Location: [facility];  Service: General;  Laterality: Left;  .  COLONOSCOPY    . COLONOSCOPY N/A 03/20/2017   Procedure: COLONOSCOPY;  Surgeon: Mauri Pole, MD;  [facility];  Service: Endoscopy;  Laterality: N/A;  . DILATATION & CURETTAGE/HYSTEROSCOPY WITH TRUECLEAR N/A 01/06/2014   Procedure: DILATATION & CURETTAGE/HYSTEROSCOPY WITH TRUCLEAR;  Surgeon: Shon Millet II, MD;  Location: [facility];  Service: Gynecology;  Laterality: N/A;  . HYSTEROPLASTY  01/2014  . INNER EAR SURGERY Bilateral    for hearing loss  . JOINT REPLACEMENT Right    Knee  . LAPAROSCOPIC GASTRIC  SLEEVE RESECTION N/A 01/28/2016   Procedure: LAPAROSCOPIC GASTRIC SLEEVE RESECTION WITH UPPER ENDO;  Surgeon: Excell Seltzer, MD;  [facility];  Service: General;  Laterality: N/A;  . PANNICULECTOMY N/A 06/18/2018   Procedure: PANNICULECTOMY;  Surgeon: Irene Limbo, MD;  Location: [facility];  Service: Plastics;  Laterality: N/A;  . RE-EXCISION OF BREAST LUMPECTOMY Left 02/02/2014   Procedure: RE-EXCISION OF LEFT BREAST LUMPECTOMY;  Surgeon: Edward Jolly, MD;  Location: [facility];  Service: General;  Laterality: Left;  . TUBAL LIGATION      Family History  Problem Relation Age of Onset  . Dementia Mother   . Heart attack Mother   . Esophageal cancer Father        also had stomach cancer  . Heart attack Father   . Asthma Other   . Hypertension Other   . Thyroid disease Other   . Heart attack Other   . Throat cancer Paternal Grandfather     Social History   Socioeconomic History  . Marital status: Divorced    Spouse name: Not on file  . Number of children: 3  . Years of education: Not on file  . Highest education level: Not on file  Occupational History  . Occupation: Retail banker: [employer]  Social Needs  . Financial resource strain: Not on file  . Food insecurity:    Worry: Not on file    Inability: Not on file  . Transportation needs:    Medical: Not on file    Non-medical: Not on file  Tobacco Use  . Smoking status: Never Smoker  . Smokeless tobacco: Never Used  Substance and Sexual Activity  . Alcohol use: No  . Drug use: No  . Sexual activity: Never    Birth control/protection: Post-menopausal  Lifestyle  . Physical activity:    Days per week: Not on file    Minutes per session: Not on file  . Stress: Not on file  Relationships  . Social connections:    Talks on phone: Not on file    Gets together: Not on file    Attends religious service: Not on file    Active member of club or organization:  Not on file    Attends meetings of clubs or organizations: Not on file    Relationship status: Not on file  . Intimate partner violence:    Fear of current or ex partner: Not on file    Emotionally abused: Not on file    Physically abused: Not on file    Forced sexual activity: Not on file  Other Topics Concern  . Not on file  Social History Narrative  . Not on file      Current Outpatient Medications:  .  albuterol (VENTOLIN HFA) 108 (90 Base) MCG/ACT inhaler, Inhale 2 puffs into the lungs every 6 (six) hours as needed for wheezing or shortness of breath.,  Disp: 36 each, Rfl: 2 .  AMITIZA 24 MCG capsule, TAKE 1 CAPSULE (24 MCG TOTAL) BY MOUTH 2 (TWO) TIMES DAILY WITH A MEAL., Disp: 60 capsule, Rfl: 1 .  amLODipine (NORVASC) 2.5 MG tablet, TAKE 1 TABLET BY MOUTH DAILY, Disp: 90 tablet, Rfl: 2 .  anastrozole (ARIMIDEX) 1 MG tablet, Take 1 tablet (1 mg total) by mouth daily., Disp: 90 tablet, Rfl: 3 .  aspirin EC 81 MG tablet, Take 81 mg by mouth at bedtime., Disp: , Rfl:  .  atorvastatin (LIPITOR) 20 MG tablet, TAKE 1 TABLET BY MOUTH ONCE DAILY AT 6 PM., Disp: 90 tablet, Rfl: 1 .  buPROPion (WELLBUTRIN SR) 150 MG 12 hr tablet, Take 1 tablet (150 mg total) by mouth daily., Disp: 90 tablet, Rfl: 0 .  Calcium Carbonate-Vitamin D (CALCIUM PLUS VITAMIN D PO), Take 1 tablet by mouth daily., Disp: , Rfl:  .  carvedilol (COREG) 12.5 MG tablet, TAKE 1 TABLET (12.5 MG TOTAL) BY MOUTH 2 (TWO) TIMES DAILY WITH A MEAL., Disp: 180 tablet, Rfl: 2 .  Cholecalciferol (VITAMIN D) 125 MCG (5000 UT) CAPS, Take 1 capsule by mouth daily., Disp: 90 capsule, Rfl: 2 .  clindamycin (CLINDAGEL) 1 % gel, Apply topically 2 (two) times daily., Disp: 30 g, Rfl: 0 .  Cyanocobalamin (VITAMIN B-12) 1000 MCG/15ML LIQD, Take 5 mLs by mouth daily., Disp: , Rfl:  .  diphenhydramine-acetaminophen (TYLENOL PM) 25-500 MG TABS, Take 2 tablets by mouth at bedtime as needed (for sleep). , Disp: , Rfl:  .  flintstones complete  (FLINTSTONES) 60 MG chewable tablet, Chew 1 tablet by mouth daily., Disp: , Rfl:  .  fluconazole (DIFLUCAN) 150 MG tablet, Take one tablet by mount daily for 3 days may repeat if symptoms recur, Disp: 30 tablet, Rfl: 1 .  fluticasone (CUTIVATE) 0.05 % cream, Apply 1 application topically as needed (rash)., Disp: 30 g, Rfl: 1 .  furosemide (LASIX) 40 MG tablet, Take 0.5 tablets (20 mg total) by mouth daily as needed for edema., Disp: 45 tablet, Rfl: 2 .  HYDROcodone-acetaminophen (NORCO/VICODIN) 5-325 MG tablet, Take 1 tablet by mouth every 12 (twelve) hours as needed for moderate pain., Disp: 40 tablet, Rfl: 0 .  ketoconazole (NIZORAL) 2 % cream, Apply 1 application topically daily as needed (rash)., Disp: 15 g, Rfl: 4 .  LORazepam (ATIVAN) 1 MG tablet, TAKE ONE TABLET BY MOUTH TWICE DAILY AS NEEDED (Patient taking differently: Take 1 tablet by mouth twice daily as needed for anxiety), Disp: 60 tablet, Rfl: 0 .  losartan (COZAAR) 100 MG tablet, TAKE 1 TABLET BY MOUTH ONCE DAILY, Disp: 90 tablet, Rfl: 1 .  meclizine (ANTIVERT) 25 MG tablet, TAKE 1 TABLET (25 MG TOTAL) BY MOUTH DAILY AS NEEDED FOR DIZZINESS., Disp: 30 tablet, Rfl: 0 .  Melatonin ER 5 MG TBCR, Take 1 tablet by mouth at bedtime., Disp: 90 tablet, Rfl: 3 .  nystatin cream (MYCOSTATIN), nystatin 100,000 unit/gram topical cream, Disp: , Rfl:  .  ondansetron (ZOFRAN-ODT) 4 MG disintegrating tablet, DISSOLVE 1 TABLET IN MOUTH EVERY 8 HOURS AS NEEDED FOR NAUSEA FOR UP TO 7 DAYS, have not started yet, will start after skin surgery, Disp: , Rfl: 0 .  pantoprazole (PROTONIX) 40 MG tablet, Take 1 tablet (40 mg total) by mouth daily., Disp: 90 tablet, Rfl: 1 .  venlafaxine XR (EFFEXOR-XR) 150 MG 24 hr capsule, TAKE 1 CAPSULE BY MOUTH DAILY WITH BREAKFAST, Disp: 90 capsule, Rfl: 3  EXAM:  VITALS per patient if applicable:Pulse  60   Resp 16   GENERAL: alert, oriented, appears well and in no acute distress  HEENT: atraumatic, conjunttiva clear,  no obvious abnormalities on inspection of face.  NECK: Otherwise normal movements of the head and neck  LUNGS: on inspection no signs of respiratory distress, breathing rate appears normal, no obvious gross SOB, gasping or wheezing  CV: no obvious cyanosis  Tamara: moves all visible extremities without noticeable abnormality  PSYCH/NEURO: pleasant and cooperative, no obvious depression or anxiety, speech and thought processing grossly intact. She is not suicidal.  ASSESSMENT AND PLAN:  Discussed the following assessment and plan:   Anxiety and depression It is stable. Depression exacerbated by weight and limitations due to obesity,so I do not think increasing medication will help in this case. I think she will benefit greatly from psychotherapy. No changes in current management. Follow-up in 3 months, before if needed.  Class 2 obesity with body mass index (BMI) of 36.0 to 36.9 in adult Reporting 10 pounds gain weigh in the past 2 weeks. We discussed benefits of wt loss as well as adverse effects of obesity. Consistency with healthy diet and physical activity recommended. We discussed Dx and obesity behavior curve through the years. At this time I am not recommending pharmacologic treatment dur to risk of side effects and possible interaction with some of her meds.  She is interested in medical management of obesity.   Chronic pain disorder We reviewed some side effects of opioids and the risk of interaction with benzodiazepines. Med contract signed in 05/2017, we will plan on signing a new one next visit. [place] controlled substance website reviewed.  Generalized osteoarthritis of multiple sites Strongly recommend avoiding activities that exacerbate back and knee pain. For now she will continue hydrocodone-acetaminophen 5-325 mg twice daily as needed.    I discussed the assessment and treatment plan with the patient. The patient was provided an opportunity to ask questions and all  were answered. The patient agreed with the plan and demonstrated an understanding of the instructions.     Return in about 3 months (around 06/13/2019) for HTN,pain,depression,anxiety.    Tamara Hatchell [place], MD

## 2019-03-14 NOTE — Assessment & Plan Note (Signed)
We reviewed some side effects of opioids and the risk of interaction with benzodiazepines. Med contract signed in 05/2017, we will plan on signing a new one next visit. [place] controlled substance website reviewed.

## 2019-03-14 NOTE — Assessment & Plan Note (Signed)
Strongly recommend avoiding activities that exacerbate back and knee pain. For now she will continue hydrocodone-acetaminophen 5-325 mg twice daily as needed.

## 2019-03-21 ENCOUNTER — Other Ambulatory Visit: Payer: Self-pay | Admitting: Oncology

## 2019-03-21 ENCOUNTER — Other Ambulatory Visit: Payer: Self-pay | Admitting: Family Medicine

## 2019-03-21 DIAGNOSIS — R600 Localized edema: Secondary | ICD-10-CM

## 2019-03-21 DIAGNOSIS — K5900 Constipation, unspecified: Secondary | ICD-10-CM

## 2019-03-21 MED ORDER — FUROSEMIDE 40 MG PO TABS: 20.0000 mg | ORAL_TABLET | Freq: Every day | ORAL | 2 refills | Status: DC | PRN

## 2019-03-21 NOTE — Telephone Encounter (Signed)
Requested Prescriptions  Pending Prescriptions Disp Refills  . AMITIZA 24 MCG capsule [Pharmacy Med Name: AMITIZA 24 MCG CAPSULES] 60 capsule 1    Sig: TAKE 1 CAPSULE BY MOUTH 2 TIMES DAILY WITH A MEAL.     Gastroenterology: Irritable Bowel Syndrome Passed - 03/21/2019  1:39 PM      Passed - Valid encounter within last 12 months    Recent Outpatient Visits          1 week ago Generalized osteoarthritis of multiple sites   [facility] [place], Malka So, MD   3 months ago Generalized osteoarthritis of multiple sites   [facility] [place], Malka So, MD   6 months ago Constipation, unspecified constipation type   [facility] [place], Malka So, MD   9 months ago Generalized osteoarthritis of multiple sites   [facility] [place], Malka So, MD   1 year ago Generalized osteoarthritis of multiple sites   [facility] [place], Malka So, MD           . furosemide (LASIX) 40 MG tablet 45 tablet 2    Sig: Take 0.5 tablets (20 mg total) by mouth daily as needed for edema.     Cardiovascular:  Diuretics - Loop Failed - 03/21/2019  1:39 PM      Failed - Last BP in normal range    BP Readings from Last 1 Encounters:  12/13/18 140/72         Passed - K in normal range and within 360 days    Potassium  Date Value Ref Range Status  06/11/2018 3.5 3.5 - 5.1 mmol/L Final  02/13/2016 4.4 3.5 - 5.1 mEq/L Final         Passed - Ca in normal range and within 360 days    Calcium  Date Value Ref Range Status  06/11/2018 9.1 8.9 - 10.3 mg/dL Final  02/13/2016 9.9 8.4 - 10.4 mg/dL Final         Passed - Na in normal range and within 360 days    Sodium  Date Value Ref Range Status  06/11/2018 141 135 - 145 mmol/L Final  02/13/2016 140 136 - 145 mEq/L Final         Passed - Cr in normal range and within 360 days    Creatinine  Date Value Ref Range Status  02/13/2016 0.9 0.6 - 1.1 mg/dL Final    Creatinine, Ser  Date Value Ref Range Status  06/11/2018 0.79 0.44 - 1.00 mg/dL Final         Passed - Valid encounter within last 6 months    Recent Outpatient Visits          1 week ago Generalized osteoarthritis of multiple sites   [facility] [place], Malka So, MD   3 months ago Generalized osteoarthritis of multiple sites   [facility] [place], Malka So, MD   6 months ago Constipation, unspecified constipation type   [facility] [place], Malka So, MD   9 months ago Generalized osteoarthritis of multiple sites   [facility] [place], Malka So, MD   1 year ago Generalized osteoarthritis of multiple sites   [facility] [place], Malka So, MD

## 2019-03-25 ENCOUNTER — Telehealth: Payer: Self-pay | Admitting: Oncology

## 2019-03-25 NOTE — Telephone Encounter (Signed)
R/s appt per 4/23 sch message - unable to reach patient - left message with new appt date and time and sent reminder letter in the mail

## 2019-03-31 ENCOUNTER — Ambulatory Visit: Payer: Medicare Other | Admitting: Oncology

## 2019-04-05 ENCOUNTER — Other Ambulatory Visit: Payer: Self-pay | Admitting: Family Medicine

## 2019-04-05 DIAGNOSIS — M159 Polyosteoarthritis, unspecified: Secondary | ICD-10-CM

## 2019-04-07 ENCOUNTER — Other Ambulatory Visit: Payer: Self-pay | Admitting: Family Medicine

## 2019-04-07 DIAGNOSIS — M159 Polyosteoarthritis, unspecified: Secondary | ICD-10-CM

## 2019-04-08 MED ORDER — HYDROCODONE-ACETAMINOPHEN 5-325 MG PO TABS: 1.0000 | ORAL_TABLET | Freq: Two times a day (BID) | ORAL | 0 refills | Status: DC | PRN

## 2019-04-08 MED ORDER — MECLIZINE HCL 25 MG PO TABS: 25.0000 mg | ORAL_TABLET | Freq: Every day | ORAL | 0 refills | Status: DC | PRN

## 2019-04-13 ENCOUNTER — Other Ambulatory Visit: Payer: Self-pay | Admitting: Family Medicine

## 2019-04-13 DIAGNOSIS — F329 Major depressive disorder, single episode, unspecified: Secondary | ICD-10-CM

## 2019-04-13 DIAGNOSIS — F419 Anxiety disorder, unspecified: Secondary | ICD-10-CM

## 2019-04-15 ENCOUNTER — Other Ambulatory Visit: Payer: Self-pay | Admitting: Oncology

## 2019-04-15 MED ORDER — BUPROPION HCL ER (SR) 150 MG PO TB12: 150.0000 mg | ORAL_TABLET | Freq: Every day | ORAL | 1 refills | Status: DC

## 2019-04-19 ENCOUNTER — Ambulatory Visit: Payer: Medicare Other | Admitting: Pulmonary Disease

## 2019-04-20 DIAGNOSIS — Z9884 Bariatric surgery status: Secondary | ICD-10-CM | POA: Diagnosis not present

## 2019-04-20 DIAGNOSIS — L987 Excessive and redundant skin and subcutaneous tissue: Secondary | ICD-10-CM | POA: Diagnosis not present

## 2019-04-20 DIAGNOSIS — M793 Panniculitis, unspecified: Secondary | ICD-10-CM | POA: Diagnosis not present

## 2019-04-22 ENCOUNTER — Encounter: Payer: Self-pay | Admitting: Family Medicine

## 2019-05-02 ENCOUNTER — Other Ambulatory Visit: Payer: Self-pay | Admitting: Family Medicine

## 2019-05-02 DIAGNOSIS — K5900 Constipation, unspecified: Secondary | ICD-10-CM

## 2019-05-11 ENCOUNTER — Other Ambulatory Visit: Payer: Self-pay | Admitting: Family Medicine

## 2019-05-11 DIAGNOSIS — M159 Polyosteoarthritis, unspecified: Secondary | ICD-10-CM

## 2019-05-16 ENCOUNTER — Other Ambulatory Visit: Payer: Self-pay | Admitting: Family Medicine

## 2019-05-16 DIAGNOSIS — M159 Polyosteoarthritis, unspecified: Secondary | ICD-10-CM

## 2019-05-17 MED ORDER — HYDROCODONE-ACETAMINOPHEN 5-325 MG PO TABS: 1.0000 | ORAL_TABLET | Freq: Two times a day (BID) | ORAL | 0 refills | Status: DC | PRN

## 2019-05-18 DIAGNOSIS — M1712 Unilateral primary osteoarthritis, left knee: Secondary | ICD-10-CM | POA: Diagnosis not present

## 2019-05-23 ENCOUNTER — Other Ambulatory Visit: Payer: Self-pay | Admitting: Family Medicine

## 2019-06-09 DIAGNOSIS — Z9884 Bariatric surgery status: Secondary | ICD-10-CM | POA: Diagnosis not present

## 2019-06-09 DIAGNOSIS — L304 Erythema intertrigo: Secondary | ICD-10-CM | POA: Diagnosis not present

## 2019-06-09 DIAGNOSIS — M793 Panniculitis, unspecified: Secondary | ICD-10-CM | POA: Diagnosis not present

## 2019-06-09 NOTE — H&P (Signed)
Subjective:     Patient ID: Tamara [name] is a 68 y.o. female.  Follow-up   Here for follow up discussion prior to planned bilateral thigh lipectomy. Patient reports with recurrent yeast infections over thighs for over 6 months duration similar to her prior abdominal complaints. These occur over once per month and continue despite oral diflucan and topical medications. At her initial consult, she reported that she was induced for knee surgery but then awoken without undergoing surgery due to presence rash over thighs and knees. Wt stable over last 10 months.     Highest wt 330 lb. Underwent lap sleeve gastrectomy 2017. Goal less than 200 lb. She is post op abdominal panniculectomy 05/2018. Soft tissue resection 6936 g, including resection umbilicus  Patient lives alone but has 3 kids and their spouses in area to help care for her.  PMH significant for OSA- on CPAP. Takes Norco chronically, has pain contract, obtains from PCP.  PMH also with Left breast DCIS post lumpectomy and adjuvant radiation, last MMG 08/2018.   Review of Systems   Objective:   Physical Exam  Cardiovascular: Normal rate, regular rhythm and normal heart sounds.  Pulmonary/Chest: Effort normal and breath sounds normal.    Abd: soft scars maturing small redundant tissue bilateral lateral extent scar. Thighs: bilateral redundant tissue with multiple soft tissue rolls that extend to knees soft tissue circumference 70 cm bilateral , additional folds on left vs right Knee right anterior scar    Assessment:     Intertrigo thighs Panniculitis abdomen s/p abdominal panniculectomy S/p sleeve gastrectomy    Plan:     Plan bilateral thigh lipectomy. Reviewed that this is more difficult surgery from which to recover, again quoted 100 % chance wound healing problems, post operative restrictions would include leg elevation, compression socks/hose or ACE wraps. Following last surgery patient did not limit her activities  much- counseled with thighs she would need to plan to have family or friends to aid with house work, cooking, shopping, driving for 4-6 weeks time. This procedure will not affect buttocks minimal change contour hips/lateral thighs.  Additional risks including but not limited to bleeding, seroma, hematoma, lymphedema, need for additional procedures, asymmetry, damage to adjacent structures, DVT/PE, cardiopulmonary complications reviewed. Completed ASPS medial thigh lift informed consent.  Discussed risk COVID infectionthrough this elective surgery. Patient will receive COVID testing prior to surgery. Discussed even if patient receivesa negative test result, the tests in some cases may fail to detect the virus or patient maycontract COVID after the test.COVID 19 infectionbefore/during/aftersurgery may result in lead to a higher chance of complication and death.  Patient on pain contract for chronic Norco use- she will discuss with pain provider if she would like me to provide additional Rx after [place], MD [facility] [phone], pin [phone]

## 2019-06-10 ENCOUNTER — Encounter: Payer: Self-pay | Admitting: Family Medicine

## 2019-06-14 ENCOUNTER — Other Ambulatory Visit: Payer: Self-pay | Admitting: Family Medicine

## 2019-06-14 ENCOUNTER — Encounter: Payer: Self-pay | Admitting: Family Medicine

## 2019-06-14 DIAGNOSIS — M159 Polyosteoarthritis, unspecified: Secondary | ICD-10-CM

## 2019-06-14 NOTE — Pre-Procedure Instructions (Signed)
[id number] [address], [place] [address] Phone: [phone] Fax: [phone]  [id number] [place], [place] - [address] AT [facility] [address] [place] [address] Phone: [phone] Fax: [phone]      Your procedure is scheduled on  06-20-19 from 0730-0850  Report to [facility] Main Entrance "A" at 0530 A.M., and check in at the Admitting office.  Call this number if you have problems the morning of surgery:  [phone]  Call [phone] if you have any questions prior to your surgery date Monday-Friday 8am-4pm    Remember:  Do not eat or drink after midnight the night before your surgery  Take these medicines the morning of surgery with A SIP OF WATER: pantoprazole (PROTONIX) carvedilol (COREG) amLODipine (NORVASC) buPROPion (WELLBUTRIN SR)  venlafaxine XR (EFFEXOR-XR) albuterol (VENTOLIN HFA) as needed HYDROcodone-acetaminophen (NORCO/VICODIN)as needed meclizine (ANTIVERT) as needed   7 days prior to surgery STOP taking any Aspirin (unless otherwise instructed by your surgeon), Aleve, Naproxen, Ibuprofen, Motrin, Advil, Goody's, BC's, all herbal medications, fish oil, and all vitamins.    The Morning of Surgery  Do not wear jewelry, make-up or nail polish.  Do not wear lotions, powders, or perfumes/colognes, or deodorant  Do not shave 48 hours prior to surgery.  Men may shave face and neck.  Do not bring valuables to the hospital.  [facility] is not responsible for any belongings or valuables.  If you are a smoker, DO NOT Smoke 24 hours prior to surgery IF you wear a CPAP at night please bring your mask, tubing, and machine the morning of surgery   Remember that you must have someone to transport you home after your surgery, and remain with you for 24 hours if you are discharged the same day.   Contacts, glasses, hearing aids, dentures  or bridgework may not be worn into surgery.    Leave your suitcase in the car.  After surgery it may be brought to your room.  For patients admitted to the hospital, discharge time will be determined by your treatment team.  Patients discharged the day of surgery will not be allowed to drive home.    Special instructions:   [facility]- Preparing For Surgery  Before surgery, you can play an important role. Because skin is not sterile, your skin needs to be as free of germs as possible. You can reduce the number of germs on your skin by washing with CHG (chlorahexidine gluconate) Soap before surgery.  CHG is an antiseptic cleaner which kills germs and bonds with the skin to continue killing germs even after washing.    Oral Hygiene is also important to reduce your risk of infection.  Remember - BRUSH YOUR TEETH THE MORNING OF SURGERY WITH YOUR REGULAR TOOTHPASTE  Please do not use if you have an allergy to CHG or antibacterial soaps. If your skin becomes reddened/irritated stop using the CHG.  Do not shave (including legs and underarms) for at least 48 hours prior to first CHG shower. It is OK to shave your face.  Please follow these instructions carefully.   1. Shower the NIGHT BEFORE SURGERY and the MORNING OF SURGERY with CHG Soap.   2. If you chose to wash your hair, wash your hair first as usual with your normal shampoo.  3. After you shampoo, rinse your hair and body thoroughly to remove the shampoo.  4. Use CHG  as you would any other liquid soap. You can apply CHG directly to the skin and wash gently with a scrungie or a clean washcloth.   5. Apply the CHG Soap to your body ONLY FROM THE NECK DOWN.  Do not use on open wounds or open sores. Avoid contact with your eyes, ears, mouth and genitals (private parts). Wash Face and genitals (private parts)  with your normal soap.   6. Wash thoroughly, paying special attention to the area where your surgery will be  performed.  7. Thoroughly rinse your body with warm water from the neck down.  8. DO NOT shower/wash with your normal soap after using and rinsing off the CHG Soap.  9. Pat yourself dry with a CLEAN TOWEL.  10. Wear CLEAN PAJAMAS to bed the night before surgery, wear comfortable clothes the morning of surgery  11. Place CLEAN SHEETS on your bed the night of your first shower and DO NOT SLEEP WITH PETS.    Day of Surgery:  Do not apply any deodorants/lotions. Please shower the morning of surgery with the CHG soap  Please wear clean clothes to the hospital/surgery center.   Remember to brush your teeth WITH YOUR REGULAR TOOTHPASTE.   Please read over the following fact sheets that you were given.

## 2019-06-15 ENCOUNTER — Encounter (HOSPITAL_COMMUNITY)
Admission: RE | Admit: 2019-06-15 | Discharge: 2019-06-15 | Disposition: A | Discharge status: Home / Self Care | Payer: Medicare Other | Source: Ambulatory Visit | Attending: Plastic Surgery | Admitting: Plastic Surgery

## 2019-06-15 ENCOUNTER — Encounter (HOSPITAL_COMMUNITY): Payer: Self-pay

## 2019-06-15 ENCOUNTER — Other Ambulatory Visit: Payer: Self-pay

## 2019-06-15 ENCOUNTER — Other Ambulatory Visit: Payer: Self-pay | Admitting: Family Medicine

## 2019-06-15 DIAGNOSIS — I10 Essential (primary) hypertension: Secondary | ICD-10-CM | POA: Insufficient documentation

## 2019-06-15 DIAGNOSIS — Z1159 Encounter for screening for other viral diseases: Secondary | ICD-10-CM | POA: Insufficient documentation

## 2019-06-15 DIAGNOSIS — Z01818 Encounter for other preprocedural examination: Secondary | ICD-10-CM | POA: Insufficient documentation

## 2019-06-15 DIAGNOSIS — M159 Polyosteoarthritis, unspecified: Secondary | ICD-10-CM

## 2019-06-15 LAB — BASIC METABOLIC PANEL
Anion gap: 9 (ref 5–15)
BUN: 21 mg/dL (ref 8–23)
CO2: 26 mmol/L (ref 22–32)
Calcium: 8.8 mg/dL — ABNORMAL LOW (ref 8.9–10.3)
Chloride: 107 mmol/L (ref 98–111)
Creatinine, Ser: 0.77 mg/dL (ref 0.44–1.00)
GFR calc Af Amer: >60 mL/min (ref >60)
GFR calc non Af Amer: >60 mL/min (ref >60)
Glucose, Bld: 71 mg/dL (ref 70–99)
Potassium: 3.2 mmol/L — ABNORMAL LOW (ref 3.5–5.1)
Sodium: 142 mmol/L (ref 135–145)

## 2019-06-15 LAB — CBC WITH DIFFERENTIAL/PLATELET
Abs Immature Granulocytes: 0.03 10*3/uL (ref 0.00–0.07)
Basophils Absolute: 0.1 10*3/uL (ref 0.0–0.1)
Basophils Relative: 1 %
Eosinophils Absolute: 0.1 10*3/uL (ref 0.0–0.5)
Eosinophils Relative: 2 %
HCT: 41.4 % (ref 36.0–46.0)
Hemoglobin: 13.4 g/dL (ref 12.0–15.0)
Immature Granulocytes: 0 %
Lymphocytes Relative: 36 %
Lymphs Abs: 2.5 10*3/uL (ref 0.7–4.0)
MCH: 28.5 pg (ref 26.0–34.0)
MCHC: 32.4 g/dL (ref 30.0–36.0)
MCV: 88.1 fL (ref 80.0–100.0)
Monocytes Absolute: 0.8 10*3/uL (ref 0.1–1.0)
Monocytes Relative: 11 %
Neutro Abs: 3.5 10*3/uL (ref 1.7–7.7)
Neutrophils Relative %: 50 %
Platelets: 207 10*3/uL (ref 150–400)
RBC: 4.7 MIL/uL (ref 3.87–5.11)
RDW: 13.6 % (ref 11.5–15.5)
WBC: 7 10*3/uL (ref 4.0–10.5)
nRBC: 0 % (ref 0.0–0.2)

## 2019-06-15 MED ORDER — HYDROCODONE-ACETAMINOPHEN 5-325 MG PO TABS: 1.0000 | ORAL_TABLET | Freq: Four times a day (QID) | ORAL | 0 refills | Status: DC | PRN

## 2019-06-15 NOTE — Progress Notes (Signed)
PCP - Dr. Betty [place] Cardiologist - denies  Chest x-ray - N/A EKG - 06/15/19 Stress Test - denies ECHO - denies Cardiac Cath - denies   Sleep Study - 2019 CPAP - uses QHS- pressure setting 9.0   Aspirin Instructions: Patient instructed to hold all Aspirin, NSAID's, herbal medications, fish oil and vitamins 7 days prior to surgery.   Anesthesia review: patient told she has "small airway" after previous surgery  Patient denies shortness of breath, fever, cough and chest pain at PAT appointment   Patient verbalized understanding of instructions that were given to them at the PAT appointment. Patient was also instructed that they will need to review over the PAT instructions again at home before surgery.

## 2019-06-15 NOTE — Pre-Procedure Instructions (Signed)
[id number] [address], [place] [address] Phone: [phone] Fax: [phone]  [id number] [place], [place] - [address] AT [facility] [address] [place] [address] Phone: [phone] Fax: [phone]      Your procedure is scheduled on Monday July 20th.  Report to [facility] Main Entrance "A" at 5:30 A.M., and check in at the Admitting office.  Call this number if you have problems the morning of surgery:  [phone]  Call [phone] if you have any questions prior to your surgery date Monday-Friday 8am-4pm    Remember:  Do not eat or drink after midnight the night before your surgery    Take these medicines the morning of surgery with A SIP OF WATER  amLODipine (NORVASC)  buPROPion (WELLBUTRIN SR)  carvedilol (COREG)  venlafaxine XR (EFFEXOR-XR) albuterol (VENTOLIN HFA) if needed *bring with you to the hospital day of surgery* HYDROcodone-acetaminophen (NORCO/VICODIN) if needed meclizine (ANTIVERT) if needed pantoprazole (PROTONIX)    7 days prior to surgery STOP taking any Aspirin (unless otherwise instructed by your surgeon), Aleve, Naproxen, Ibuprofen, Motrin, Advil, Goody's, BC's, all herbal medications, fish oil, and all vitamins.    The Morning of Surgery  Do not wear jewelry, make-up or nail polish.  Do not wear lotions, powders, or perfumes/colognes, or deodorant  Do not shave 48 hours prior to surgery.  Men may shave face and neck.  Do not bring valuables to the hospital.  [facility] is not responsible for any belongings or valuables.  If you are a smoker, DO NOT Smoke 24 hours prior to surgery IF you wear a CPAP at night please bring your mask, tubing, and machine the morning of surgery   Remember that you must have someone to transport you home after your surgery, and remain with you for 24 hours if you are discharged the same  day.   Contacts, glasses, hearing aids, dentures or bridgework may not be worn into surgery.    Leave your suitcase in the car.  After surgery it may be brought to your room.  For patients admitted to the hospital, discharge time will be determined by your treatment team.  Patients discharged the day of surgery will not be allowed to drive home.    Special instructions:   [facility]- Preparing For Surgery  Before surgery, you can play an important role. Because skin is not sterile, your skin needs to be as free of germs as possible. You can reduce the number of germs on your skin by washing with CHG (chlorahexidine gluconate) Soap before surgery.  CHG is an antiseptic cleaner which kills germs and bonds with the skin to continue killing germs even after washing.    Oral Hygiene is also important to reduce your risk of infection.  Remember - BRUSH YOUR TEETH THE MORNING OF SURGERY WITH YOUR REGULAR TOOTHPASTE  Please do not use if you have an allergy to CHG or antibacterial soaps. If your skin becomes reddened/irritated stop using the CHG.  Do not shave (including legs and underarms) for at least 48 hours prior to first CHG shower. It is OK to shave your face.  Please follow these instructions carefully.   1. Shower the NIGHT BEFORE SURGERY and the MORNING OF SURGERY with CHG Soap.   2. If you chose to wash your hair, wash your hair first as usual with your normal shampoo.  3. After you shampoo,  rinse your hair and body thoroughly to remove the shampoo.  4. Use CHG as you would any other liquid soap. You can apply CHG directly to the skin and wash gently with a scrungie or a clean washcloth.   5. Apply the CHG Soap to your body ONLY FROM THE NECK DOWN.  Do not use on open wounds or open sores. Avoid contact with your eyes, ears, mouth and genitals (private parts). Wash Face and genitals (private parts)  with your normal soap.   6. Wash thoroughly, paying special attention to the  area where your surgery will be performed.  7. Thoroughly rinse your body with warm water from the neck down.  8. DO NOT shower/wash with your normal soap after using and rinsing off the CHG Soap.  9. Pat yourself dry with a CLEAN TOWEL.  10. Wear CLEAN PAJAMAS to bed the night before surgery, wear comfortable clothes the morning of surgery  11. Place CLEAN SHEETS on your bed the night of your first shower and DO NOT SLEEP WITH PETS.    Day of Surgery:  Do not apply any deodorants/lotions. Please shower the morning of surgery with the CHG soap  Please wear clean clothes to the hospital/surgery center.   Remember to brush your teeth WITH YOUR REGULAR TOOTHPASTE.   Please read over the following fact sheets that you were given.

## 2019-06-16 ENCOUNTER — Other Ambulatory Visit (HOSPITAL_COMMUNITY)
Admission: RE | Admit: 2019-06-16 | Discharge: 2019-06-16 | Disposition: A | Discharge status: Home / Self Care | Payer: Medicare Other | Source: Ambulatory Visit | Attending: Plastic Surgery | Admitting: Plastic Surgery

## 2019-06-16 ENCOUNTER — Other Ambulatory Visit: Payer: Self-pay | Admitting: *Deleted

## 2019-06-16 DIAGNOSIS — I10 Essential (primary) hypertension: Secondary | ICD-10-CM

## 2019-06-16 DIAGNOSIS — Z1159 Encounter for screening for other viral diseases: Secondary | ICD-10-CM | POA: Diagnosis not present

## 2019-06-16 DIAGNOSIS — Z01818 Encounter for other preprocedural examination: Secondary | ICD-10-CM | POA: Diagnosis not present

## 2019-06-16 LAB — SARS CORONAVIRUS 2 (TAT 6-24 HRS): SARS Coronavirus 2: NEGATIVE

## 2019-06-16 MED ORDER — AMLODIPINE BESYLATE 2.5 MG PO TABS: 2.5000 mg | ORAL_TABLET | Freq: Every day | ORAL | 2 refills | Status: DC

## 2019-06-16 NOTE — Anesthesia Preprocedure Evaluation (Addendum)
Anesthesia Evaluation  Patient identified by MRN, date of birth, ID band Patient awake    Reviewed: Allergy & Precautions, NPO status , Patient's Chart, lab work & pertinent test results  History of Anesthesia Complications Negative for: history of anesthetic complications  Airway Mallampati: II  TM Distance: >3 FB Neck ROM: Full    Dental  (+) Upper Dentures, Lower Dentures   Pulmonary neg shortness of breath, asthma , sleep apnea and Continuous Positive Airway Pressure Ventilation , neg recent URI,    breath sounds clear to auscultation       Cardiovascular hypertension, Pt. on medications (-) angina(-) Past MI and (-) CHF  Rhythm:Regular     Neuro/Psych PSYCHIATRIC DISORDERS Anxiety Depression negative neurological ROS     GI/Hepatic Neg liver ROS, GERD  Medicated,  Endo/Other  neg diabetesMorbid obesity  Renal/GU negative Renal ROS     Musculoskeletal  (+) Arthritis ,   Abdominal   Peds  Hematology negative hematology ROS (+)   Anesthesia Other Findings   Reproductive/Obstetrics                            Anesthesia Physical Anesthesia Plan  ASA: I  Anesthesia Plan: General   Post-op Pain Management:    Induction: Intravenous  PONV Risk Score and Plan: 3 and Ondansetron and Dexamethasone  Airway Management Planned: Oral ETT  Additional Equipment: None  Intra-op Plan:   Post-operative Plan: Extubation in OR  Informed Consent: I have reviewed the patients History and Physical, chart, labs and discussed the procedure including the risks, benefits and alternatives for the proposed anesthesia with the patient or authorized representative who has indicated his/her understanding and acceptance.     Dental advisory given  Plan Discussed with: CRNA and Surgeon  Anesthesia Plan Comments: (Pt reports being told she has a "small airway". Per review of airway note from surgery  01/28/16: "Comments: DL x1 attempt with MAC #3 unsuccessful placement, DL x1 with glidescope and grade 1 view, ETT place with ease. Easy mask ventilation." Glidescope was used for intubation 05/28/30, no complication noted. )       Anesthesia Quick Evaluation

## 2019-06-20 ENCOUNTER — Ambulatory Visit (HOSPITAL_COMMUNITY): Payer: Medicare Other | Admitting: Physician Assistant

## 2019-06-20 ENCOUNTER — Encounter (HOSPITAL_COMMUNITY)
Admission: RE | Disposition: A | Discharge status: Home / Self Care | Payer: Self-pay | Source: Home / Self Care | Attending: Plastic Surgery

## 2019-06-20 ENCOUNTER — Observation Stay (HOSPITAL_COMMUNITY)
Admission: RE | Admit: 2019-06-20 | Discharge: 2019-06-21 | Disposition: A | Discharge status: Home / Self Care | Payer: Medicare Other | Attending: Plastic Surgery | Admitting: Plastic Surgery

## 2019-06-20 ENCOUNTER — Other Ambulatory Visit: Payer: Self-pay

## 2019-06-20 ENCOUNTER — Ambulatory Visit (HOSPITAL_COMMUNITY): Payer: Medicare Other | Admitting: Certified Registered"

## 2019-06-20 ENCOUNTER — Encounter (HOSPITAL_COMMUNITY): Payer: Self-pay

## 2019-06-20 DIAGNOSIS — Z9884 Bariatric surgery status: Secondary | ICD-10-CM | POA: Insufficient documentation

## 2019-06-20 DIAGNOSIS — I10 Essential (primary) hypertension: Secondary | ICD-10-CM | POA: Diagnosis not present

## 2019-06-20 DIAGNOSIS — L304 Erythema intertrigo: Secondary | ICD-10-CM | POA: Diagnosis not present

## 2019-06-20 DIAGNOSIS — G4733 Obstructive sleep apnea (adult) (pediatric): Secondary | ICD-10-CM | POA: Diagnosis not present

## 2019-06-20 DIAGNOSIS — M793 Panniculitis, unspecified: Principal | ICD-10-CM | POA: Diagnosis present

## 2019-06-20 DIAGNOSIS — F418 Other specified anxiety disorders: Secondary | ICD-10-CM | POA: Diagnosis not present

## 2019-06-20 DIAGNOSIS — Z79891 Long term (current) use of opiate analgesic: Secondary | ICD-10-CM | POA: Diagnosis not present

## 2019-06-20 DIAGNOSIS — M1990 Unspecified osteoarthritis, unspecified site: Secondary | ICD-10-CM | POA: Diagnosis not present

## 2019-06-20 DIAGNOSIS — Z923 Personal history of irradiation: Secondary | ICD-10-CM | POA: Diagnosis not present

## 2019-06-20 DIAGNOSIS — E785 Hyperlipidemia, unspecified: Secondary | ICD-10-CM | POA: Diagnosis not present

## 2019-06-20 DIAGNOSIS — Z6835 Body mass index (BMI) 35.0-35.9, adult: Secondary | ICD-10-CM | POA: Diagnosis not present

## 2019-06-20 HISTORY — PX: LIPOSUCTION WITH LIPOFILLING: SHX6436

## 2019-06-20 SURGERY — LIPOSUCTION, WITH FAT TRANSFER
Anesthesia: General | Site: Thigh | Laterality: Bilateral

## 2019-06-20 MED ORDER — ACETAMINOPHEN 500 MG PO TABS
1000.0000 mg | ORAL_TABLET | ORAL | Status: AC
  Administered 2019-06-20: 07:00:00 1000 mg via ORAL
  Filled 2019-06-20: qty 2

## 2019-06-20 MED ORDER — BUPIVACAINE LIPOSOME 1.3 % IJ SUSP
INTRAMUSCULAR | Status: DC | PRN
  Administered 2019-06-20: 20 mL

## 2019-06-20 MED ORDER — ACETAMINOPHEN 160 MG/5ML PO SOLN: 1000.0000 mg | Freq: Once | ORAL | Status: DC | PRN

## 2019-06-20 MED ORDER — ONDANSETRON HCL 4 MG/2ML IJ SOLN
INTRAMUSCULAR | Status: DC | PRN
  Administered 2019-06-20: 4 mg via INTRAVENOUS

## 2019-06-20 MED ORDER — MIDAZOLAM HCL 2 MG/2ML IJ SOLN
INTRAMUSCULAR | Status: DC | PRN
  Administered 2019-06-20: 2 mg via INTRAVENOUS

## 2019-06-20 MED ORDER — FENTANYL CITRATE (PF) 250 MCG/5ML IJ SOLN
INTRAMUSCULAR | Status: DC | PRN
  Administered 2019-06-20 (×5): 50 ug via INTRAVENOUS

## 2019-06-20 MED ORDER — OXYCODONE HCL 5 MG/5ML PO SOLN: 5.0000 mg | Freq: Once | ORAL | Status: DC | PRN

## 2019-06-20 MED ORDER — ENOXAPARIN SODIUM 40 MG/0.4ML ~~LOC~~ SOLN
40.0000 mg | SUBCUTANEOUS | Status: DC
  Administered 2019-06-21: 08:00:00 40 mg via SUBCUTANEOUS
  Filled 2019-06-20: qty 0.4

## 2019-06-20 MED ORDER — SODIUM BICARBONATE 4 % IV SOLN
INTRAVENOUS | Status: DC | PRN
  Administered 2019-06-20: 750 mL via INTRAMUSCULAR

## 2019-06-20 MED ORDER — CEFAZOLIN SODIUM-DEXTROSE 2-4 GM/100ML-% IV SOLN
2.0000 g | INTRAVENOUS | Status: AC
  Administered 2019-06-20: 08:00:00 2 g via INTRAVENOUS
  Filled 2019-06-20: qty 100

## 2019-06-20 MED ORDER — ONDANSETRON HCL 4 MG/2ML IJ SOLN: 4.0000 mg | Freq: Four times a day (QID) | INTRAMUSCULAR | Status: DC | PRN

## 2019-06-20 MED ORDER — BUPIVACAINE LIPOSOME 1.3 % IJ SUSP
20.0000 mL | Freq: Once | INTRAMUSCULAR | Status: DC
  Filled 2019-06-20: qty 20

## 2019-06-20 MED ORDER — LACTATED RINGERS IV SOLN
INTRAVENOUS | Status: DC | PRN
  Administered 2019-06-20 (×2): via INTRAVENOUS

## 2019-06-20 MED ORDER — ALBUTEROL SULFATE (2.5 MG/3ML) 0.083% IN NEBU: 3.0000 mL | INHALATION_SOLUTION | Freq: Four times a day (QID) | RESPIRATORY_TRACT | Status: DC | PRN

## 2019-06-20 MED ORDER — FENTANYL CITRATE (PF) 100 MCG/2ML IJ SOLN: 25.0000 ug | INTRAMUSCULAR | Status: DC | PRN

## 2019-06-20 MED ORDER — ACETAMINOPHEN 500 MG PO TABS: 1000.0000 mg | ORAL_TABLET | Freq: Once | ORAL | Status: DC | PRN

## 2019-06-20 MED ORDER — PANTOPRAZOLE SODIUM 40 MG PO TBEC
40.0000 mg | DELAYED_RELEASE_TABLET | Freq: Every day | ORAL | Status: DC
  Administered 2019-06-21: 10:00:00 40 mg via ORAL
  Filled 2019-06-20: qty 1

## 2019-06-20 MED ORDER — ACETAMINOPHEN 10 MG/ML IV SOLN: 1000.0000 mg | Freq: Once | INTRAVENOUS | Status: DC | PRN

## 2019-06-20 MED ORDER — ROCURONIUM BROMIDE 10 MG/ML (PF) SYRINGE
PREFILLED_SYRINGE | INTRAVENOUS | Status: AC
  Filled 2019-06-20: qty 10

## 2019-06-20 MED ORDER — MORPHINE SULFATE (PF) 2 MG/ML IV SOLN: 2.0000 mg | INTRAVENOUS | Status: DC | PRN

## 2019-06-20 MED ORDER — LIDOCAINE HCL (CARDIAC) PF 100 MG/5ML IV SOSY
PREFILLED_SYRINGE | INTRAVENOUS | Status: DC | PRN
  Administered 2019-06-20: 60 mg via INTRATRACHEAL

## 2019-06-20 MED ORDER — ROCURONIUM BROMIDE 100 MG/10ML IV SOLN
INTRAVENOUS | Status: DC | PRN
  Administered 2019-06-20: 70 mg via INTRAVENOUS
  Administered 2019-06-20: 30 mg via INTRAVENOUS

## 2019-06-20 MED ORDER — CHLORHEXIDINE GLUCONATE CLOTH 2 % EX PADS: 6.0000 | MEDICATED_PAD | Freq: Once | CUTANEOUS | Status: DC

## 2019-06-20 MED ORDER — SODIUM BICARBONATE 4 % IV SOLN
Freq: Once | INTRAVENOUS | Status: DC
  Filled 2019-06-20: qty 50

## 2019-06-20 MED ORDER — FENTANYL CITRATE (PF) 250 MCG/5ML IJ SOLN
INTRAMUSCULAR | Status: AC
  Filled 2019-06-20: qty 5

## 2019-06-20 MED ORDER — LOSARTAN POTASSIUM 50 MG PO TABS: 100.0000 mg | ORAL_TABLET | Freq: Every day | ORAL | Status: DC

## 2019-06-20 MED ORDER — CEFAZOLIN SODIUM 1 G IJ SOLR
INTRAMUSCULAR | Status: AC
  Filled 2019-06-20: qty 20

## 2019-06-20 MED ORDER — SODIUM CHLORIDE (PF) 0.9 % IJ SOLN
INTRAMUSCULAR | Status: DC | PRN
  Administered 2019-06-20: 20 mL

## 2019-06-20 MED ORDER — DEXAMETHASONE SODIUM PHOSPHATE 10 MG/ML IJ SOLN
INTRAMUSCULAR | Status: AC
  Filled 2019-06-20: qty 1

## 2019-06-20 MED ORDER — OXYCODONE HCL 5 MG PO TABS
5.0000 mg | ORAL_TABLET | Freq: Once | ORAL | Status: DC | PRN
  Administered 2019-06-20: 5 mg via ORAL

## 2019-06-20 MED ORDER — MIDAZOLAM HCL 2 MG/2ML IJ SOLN
INTRAMUSCULAR | Status: AC
  Filled 2019-06-20: qty 2

## 2019-06-20 MED ORDER — PROPOFOL 10 MG/ML IV BOLUS
INTRAVENOUS | Status: AC
  Filled 2019-06-20: qty 20

## 2019-06-20 MED ORDER — HYDROCODONE-ACETAMINOPHEN 5-325 MG PO TABS: 1.0000 | ORAL_TABLET | Freq: Four times a day (QID) | ORAL | Status: DC | PRN

## 2019-06-20 MED ORDER — ANASTROZOLE 1 MG PO TABS
1.0000 mg | ORAL_TABLET | Freq: Every day | ORAL | Status: DC
  Administered 2019-06-21: 10:00:00 1 mg via ORAL
  Filled 2019-06-20: qty 1

## 2019-06-20 MED ORDER — PROPOFOL 10 MG/ML IV BOLUS
INTRAVENOUS | Status: DC | PRN
  Administered 2019-06-20: 140 mg via INTRAVENOUS

## 2019-06-20 MED ORDER — AMLODIPINE BESYLATE 2.5 MG PO TABS
2.5000 mg | ORAL_TABLET | Freq: Every day | ORAL | Status: DC
  Administered 2019-06-21: 10:00:00 2.5 mg via ORAL
  Filled 2019-06-20: qty 1

## 2019-06-20 MED ORDER — DEXAMETHASONE SODIUM PHOSPHATE 10 MG/ML IJ SOLN
INTRAMUSCULAR | Status: DC | PRN
  Administered 2019-06-20: 10 mg via INTRAVENOUS

## 2019-06-20 MED ORDER — EPHEDRINE SULFATE-NACL 50-0.9 MG/10ML-% IV SOSY
PREFILLED_SYRINGE | INTRAVENOUS | Status: DC | PRN
  Administered 2019-06-20: 20 mg via INTRAVENOUS
  Administered 2019-06-20 (×3): 10 mg via INTRAVENOUS

## 2019-06-20 MED ORDER — LIDOCAINE 2% (20 MG/ML) 5 ML SYRINGE
INTRAMUSCULAR | Status: AC
  Filled 2019-06-20: qty 5

## 2019-06-20 MED ORDER — HEPARIN SODIUM (PORCINE) 5000 UNIT/ML IJ SOLN
5000.0000 [IU] | Freq: Once | INTRAMUSCULAR | Status: AC
  Administered 2019-06-20: 07:00:00 5000 [IU] via SUBCUTANEOUS
  Filled 2019-06-20: qty 1

## 2019-06-20 MED ORDER — OXYCODONE HCL 5 MG PO TABS
ORAL_TABLET | ORAL | Status: AC
  Filled 2019-06-20: qty 1

## 2019-06-20 MED ORDER — POTASSIUM CHLORIDE IN NACL 20-0.9 MEQ/L-% IV SOLN
INTRAVENOUS | Status: DC
  Administered 2019-06-20 – 2019-06-21 (×2): via INTRAVENOUS
  Filled 2019-06-20 (×2): qty 1000

## 2019-06-20 MED ORDER — BUPROPION HCL ER (SR) 150 MG PO TB12
150.0000 mg | ORAL_TABLET | Freq: Every day | ORAL | Status: DC
  Administered 2019-06-21: 150 mg via ORAL
  Filled 2019-06-20: qty 1

## 2019-06-20 MED ORDER — MECLIZINE HCL 25 MG PO TABS
25.0000 mg | ORAL_TABLET | Freq: Every day | ORAL | Status: DC | PRN
  Filled 2019-06-20: qty 1

## 2019-06-20 MED ORDER — CARVEDILOL 12.5 MG PO TABS
12.5000 mg | ORAL_TABLET | Freq: Two times a day (BID) | ORAL | Status: DC
  Administered 2019-06-20 – 2019-06-21 (×2): 12.5 mg via ORAL
  Filled 2019-06-20 (×2): qty 1

## 2019-06-20 MED ORDER — ONDANSETRON 4 MG PO TBDP: 4.0000 mg | ORAL_TABLET | Freq: Four times a day (QID) | ORAL | Status: DC | PRN

## 2019-06-20 MED ORDER — VENLAFAXINE HCL ER 75 MG PO CP24
150.0000 mg | ORAL_CAPSULE | Freq: Every day | ORAL | Status: DC
  Administered 2019-06-21: 08:00:00 150 mg via ORAL
  Filled 2019-06-20: qty 2

## 2019-06-20 MED ORDER — ATORVASTATIN CALCIUM 10 MG PO TABS: 20.0000 mg | ORAL_TABLET | Freq: Every day | ORAL | Status: DC

## 2019-06-20 MED ORDER — KETOROLAC TROMETHAMINE 15 MG/ML IJ SOLN
15.0000 mg | Freq: Three times a day (TID) | INTRAMUSCULAR | Status: AC
  Administered 2019-06-20 – 2019-06-21 (×3): 15 mg via INTRAVENOUS
  Filled 2019-06-20 (×3): qty 1

## 2019-06-20 MED ORDER — LUBIPROSTONE 24 MCG PO CAPS
24.0000 ug | ORAL_CAPSULE | Freq: Two times a day (BID) | ORAL | Status: DC
  Administered 2019-06-20 – 2019-06-21 (×2): 24 ug via ORAL
  Filled 2019-06-20 (×2): qty 1

## 2019-06-20 MED ORDER — ONDANSETRON HCL 4 MG/2ML IJ SOLN
INTRAMUSCULAR | Status: AC
  Filled 2019-06-20: qty 2

## 2019-06-20 MED ORDER — GABAPENTIN 300 MG PO CAPS
300.0000 mg | ORAL_CAPSULE | ORAL | Status: AC
  Administered 2019-06-20: 07:00:00 300 mg via ORAL
  Filled 2019-06-20: qty 1

## 2019-06-20 SURGICAL SUPPLY — 68 items
ADH SKN CLS APL DERMABOND .7 (GAUZE/BANDAGES/DRESSINGS) ×2
ADH SKN CLS LQ APL DERMABOND (GAUZE/BANDAGES/DRESSINGS) ×4
APL PRP STRL LF DISP 70% ISPRP (MISCELLANEOUS) ×1
BANDAGE ELASTIC 6 VELCRO ST LF (GAUZE/BANDAGES/DRESSINGS) ×4 IMPLANT
BINDER ABDOMINAL 12 ML 46-62 (SOFTGOODS) IMPLANT
BINDER BREAST LRG (GAUZE/BANDAGES/DRESSINGS) IMPLANT
BINDER BREAST MEDIUM (GAUZE/BANDAGES/DRESSINGS) IMPLANT
BINDER BREAST XLRG (GAUZE/BANDAGES/DRESSINGS) IMPLANT
BINDER BREAST XXLRG (GAUZE/BANDAGES/DRESSINGS) IMPLANT
BLADE SURG 10 STRL SS (BLADE) ×3 IMPLANT
BLADE SURG 11 STRL SS (BLADE) ×3 IMPLANT
BNDG GAUZE ELAST 4 BULKY (GAUZE/BANDAGES/DRESSINGS) ×6 IMPLANT
CANISTER LIPO FAT HARVEST (MISCELLANEOUS) ×3 IMPLANT
CANISTER SUCT 1200ML W/VALVE (MISCELLANEOUS) ×3 IMPLANT
CHLORAPREP W/TINT 26 (MISCELLANEOUS) ×3 IMPLANT
COVER BACK TABLE 60X90IN (DRAPES) ×3 IMPLANT
COVER MAYO STAND STRL (DRAPES) ×3 IMPLANT
COVER WAND RF STERILE (DRAPES) ×3 IMPLANT
DECANTER SPIKE VIAL GLASS SM (MISCELLANEOUS) IMPLANT
DERMABOND ADHESIVE PROPEN (GAUZE/BANDAGES/DRESSINGS) ×8
DERMABOND ADVANCED (GAUZE/BANDAGES/DRESSINGS) ×4
DERMABOND ADVANCED .7 DNX12 (GAUZE/BANDAGES/DRESSINGS) ×2 IMPLANT
DERMABOND ADVANCED .7 DNX6 (GAUZE/BANDAGES/DRESSINGS) IMPLANT
DRAIN CHANNEL 19F RND (DRAIN) ×4 IMPLANT
DRAPE TOP ARMCOVERS (MISCELLANEOUS) ×3 IMPLANT
DRAPE U-SHAPE 76X120 STRL (DRAPES) ×3 IMPLANT
DRSG PAD ABDOMINAL 8X10 ST (GAUZE/BANDAGES/DRESSINGS) ×6 IMPLANT
ELECT COATED BLADE 2.86 ST (ELECTRODE) IMPLANT
ELECT REM PT RETURN 9FT ADLT (ELECTROSURGICAL) ×3
ELECTRODE REM PT RTRN 9FT ADLT (ELECTROSURGICAL) ×1 IMPLANT
EVACUATOR SILICONE 100CC (DRAIN) ×4 IMPLANT
FILTER LIPOSUCTION (MISCELLANEOUS) ×3 IMPLANT
GAUZE SPONGE 4X4 12PLY STRL LF (GAUZE/BANDAGES/DRESSINGS) ×4 IMPLANT
GLOVE BIO SURGEON STRL SZ 6 (GLOVE) ×3 IMPLANT
GOWN STRL REUS W/ TWL LRG LVL3 (GOWN DISPOSABLE) ×2 IMPLANT
GOWN STRL REUS W/TWL LRG LVL3 (GOWN DISPOSABLE) ×6
KIT BASIN OR (CUSTOM PROCEDURE TRAY) ×3 IMPLANT
KIT TURNOVER KIT B (KITS) ×3 IMPLANT
LINER CANISTER 1000CC FLEX (MISCELLANEOUS) ×3 IMPLANT
NDL 18GX1X1/2 (RX/OR ONLY) (NEEDLE) ×1 IMPLANT
NEEDLE 18GX1X1/2 (RX/OR ONLY) (NEEDLE) ×3 IMPLANT
NS IRRIG 1000ML POUR BTL (IV SOLUTION) ×3 IMPLANT
PAD ABD 8X10 STRL (GAUZE/BANDAGES/DRESSINGS) ×8 IMPLANT
PAD ALCOHOL SWAB (MISCELLANEOUS) ×3 IMPLANT
PENCIL BUTTON HOLSTER BLD 10FT (ELECTRODE) IMPLANT
SHEET MEDIUM DRAPE 40X70 STRL (DRAPES) ×6 IMPLANT
SLEEVE SCD COMPRESS KNEE MED (MISCELLANEOUS) ×3 IMPLANT
SPONGE LAP 18X18 RF (DISPOSABLE) ×3 IMPLANT
STAPLER VISISTAT 35W (STAPLE) ×3 IMPLANT
SUT MNCRL AB 4-0 PS2 18 (SUTURE) ×3 IMPLANT
SUT PDS AB 2-0 CT2 27 (SUTURE) ×4 IMPLANT
SUT VIC AB 3-0 PS1 18 (SUTURE)
SUT VIC AB 3-0 PS1 18XBRD (SUTURE) IMPLANT
SUT VIC AB 3-0 SH 27 (SUTURE)
SUT VIC AB 3-0 SH 27X BRD (SUTURE) IMPLANT
SUT VICRYL 4-0 PS2 18IN ABS (SUTURE) IMPLANT
SYR 10ML LL (SYRINGE) ×9 IMPLANT
SYR 50ML LL SCALE MARK (SYRINGE) ×12 IMPLANT
SYR BULB IRRIGATION 50ML (SYRINGE) IMPLANT
SYR CONTROL 10ML LL (SYRINGE) IMPLANT
SYR TB 1ML 25GX5/8 (SYRINGE) IMPLANT
TOWEL GREEN STERILE FF (TOWEL DISPOSABLE) ×6 IMPLANT
TUBE CONNECTING 20'X1/4 (TUBING) ×1
TUBE CONNECTING 20X1/4 (TUBING) ×2 IMPLANT
TUBING INFILTRATION IT-10001 (TUBING) ×3 IMPLANT
TUBING SET GRADUATE ASPIR 12FT (MISCELLANEOUS) ×3 IMPLANT
UNDERPAD 30X30 (UNDERPADS AND DIAPERS) ×6 IMPLANT
YANKAUER SUCT BULB TIP NO VENT (SUCTIONS) IMPLANT

## 2019-06-20 NOTE — Interval H&P Note (Signed)
History and Physical Interval Note:  06/20/2019 6:55 AM  Tamara [name]  has presented today for surgery, with the diagnosis of panniculitis thighs, history weight loss surgery.  The various methods of treatment have been discussed with the patient and family. After consideration of risks, benefits and other options for treatment, the patient has consented to  Procedure(s): BILATERAL THIGH LIPECTOMY (Bilateral) as a surgical intervention.  The patient's history has been reviewed, patient examined, no change in status, stable for surgery.  I have reviewed the patient's chart and labs.  Questions were answered to the patient's satisfaction.     Arnoldo Hooker Azhane Eckart

## 2019-06-20 NOTE — Anesthesia Procedure Notes (Signed)
Procedure Name: Intubation Date/Time: 06/20/2019 7:43 AM Performed by: Lance Coon, CRNA Pre-anesthesia Checklist: Patient identified, Emergency Drugs available, Suction available, Patient being monitored and Timeout performed Patient Re-evaluated:Patient Re-evaluated prior to induction Oxygen Delivery Method: Circle system utilized Preoxygenation: Pre-oxygenation with 100% oxygen Induction Type: IV induction Ventilation: Mask ventilation without difficulty Laryngoscope Size: Miller and 3 Grade View: Grade I Tube type: Oral Tube size: 7.0 mm Number of attempts: 1 Airway Equipment and Method: Stylet Placement Confirmation: ETT inserted through vocal cords under direct vision,  positive ETCO2 and breath sounds checked- equal and bilateral Secured at: 21 cm Tube secured with: Tape Dental Injury: Teeth and Oropharynx as per pre-operative assessment

## 2019-06-20 NOTE — Op Note (Signed)
Operative Note   DATE OF OPERATION: 7.20.20  LOCATION: [facility] OR-observation  SURGICAL DIVISION: Plastic Surgery  PREOPERATIVE DIAGNOSES:  1. Panniculitis thighs 2. History bariatric surgery  POSTOPERATIVE DIAGNOSES:  same  PROCEDURE:  Bilateral thigh lipectomy  SURGEON: Irene Limbo MD MBA  ASSISTANTChauncey Cruel Hitchcock RNFA  ANESTHESIA:  General.   EBL: 50 ml  COMPLICATIONS: None immediate.   INDICATIONS FOR PROCEDURE:  The patient, Tamara [name], is a 68 y.o. female born on 1951/10/23, is here for bilateral medial thigh lift for treatment thigh panniculitis chronic that has failed conservative measures.    FINDINGS: Approximately 1200 ml fat aspirated prior to resection soft tissue. Right thigh 502 g Left thigh 811 g  DESCRIPTION OF PROCEDURE:  The patient's operative site was marked with the patient in the preoperative area in standing position to mark creases with desired scar at midline thighs extending to just cephalad to medial knee bilateral. Bilateral areas of soft tissue fullness at medial knee marked for liposuction. The soft tissue was displaced posteriorly and anteriorly at anticipated area of resection marked against thigh midline. The patient was taken to the operating room. Foley catheter placed,SCDs placed. SQ heparin administered, and IV antibiotics were given. The patient's operative site was prepped and draped in a sterile fashion. A time out was performed and all information was confirmed to be correct.   Stab incision made in area of planned resection bilateral thigh and tumescent infiltrated, approximately 500 ml tumescent infiltrated in each thigh in area of planned resection and medial knee soft tissue. Power assisted liposuction performedin marked areas to aid with dissection and closure. Total lipoaspirate approximately 1200 ml.   Incision made over left anteromedial thigh and carried through superficial fascia. Dissection proceeded from anterior to  posterior thigh taking care to remain just beneath superficial fascia. The area marked for resection was marked again by palpation through elevated skin flaps and resection completed. Wound irrigated and hemostasis ensured. 53 Fr JP placed percutaneously and secured with 2-0 nylon. Exparel infiltrated. Closure completed with interrrupted 2-0 PDS suture in superficial fascia. 0 V lock suture used to approximate dermis and 4-0 monocryl subcuticular suture used for skin closure.  I then directed attention to right thigh. Incision made over anteromedial thigh and carried through superficial fascia. Dissection proceeded from anterior to posterior thigh taking care to remain just beneath superficial fascia. The area marked for resection was checked again by palpation through elevated skin flaps and resection completed. Wound irrigated and hemostasis ensured. 67 Fr JP placed percutaneously and secured with 2-0 nylon. Exparel infiltrated. Closure completed with interrrupted 2-0 PDS suture in superficial fascia. 0 V lock suture used to approximate dermis and 4-0 monocryl subcuticular suture used for skin closure. Dermabond applied to all incisions.  TED hose applied. Dry dressing and Ace wraps applied to bilateral thighs.  The patient was allowed to wake from anesthesia, extubated and taken to the recovery room in satisfactory condition.   SPECIMENS: none  DRAINS: 19 Fr JP in right and left subcutaneous thigh  Irene Limbo, MD [facility] Plastic & Reconstructive Surgery [phone], pin [phone]

## 2019-06-20 NOTE — Transfer of Care (Signed)
Immediate Anesthesia Transfer of Care Note  Patient: Tamara [name]  Procedure(s) Performed: BILATERAL THIGH LIPECTOMY (Bilateral Thigh)  Patient Location: PACU  Anesthesia Type:General  Level of Consciousness: awake and patient cooperative  Airway & Oxygen Therapy: Patient Spontanous Breathing  Post-op Assessment: Report given to RN and Post -op Vital signs reviewed and stable  Post vital signs: Reviewed and stable  Last Vitals:  Vitals Value Taken Time  BP 138/51 06/20/19 1035  Temp    Pulse 58 06/20/19 1036  Resp 13 06/20/19 1036  SpO2 91 % 06/20/19 1036  Vitals shown include unvalidated device data.  Last Pain: There were no vitals filed for this visit.       Complications: No apparent anesthesia complications

## 2019-06-21 ENCOUNTER — Encounter (HOSPITAL_COMMUNITY): Payer: Self-pay | Admitting: Plastic Surgery

## 2019-06-21 DIAGNOSIS — L304 Erythema intertrigo: Secondary | ICD-10-CM | POA: Diagnosis not present

## 2019-06-21 DIAGNOSIS — Z9884 Bariatric surgery status: Secondary | ICD-10-CM | POA: Diagnosis not present

## 2019-06-21 DIAGNOSIS — I10 Essential (primary) hypertension: Secondary | ICD-10-CM | POA: Diagnosis not present

## 2019-06-21 DIAGNOSIS — M1990 Unspecified osteoarthritis, unspecified site: Secondary | ICD-10-CM | POA: Diagnosis not present

## 2019-06-21 DIAGNOSIS — M793 Panniculitis, unspecified: Secondary | ICD-10-CM | POA: Diagnosis not present

## 2019-06-21 NOTE — Progress Notes (Signed)
MD paged informed of patient bp 190/85 hr 49 informed that patient foley has been dc ed patient asymptomatic. No new orders at this time will continue to monitor. Arthor Captain LPN

## 2019-06-21 NOTE — Plan of Care (Signed)
  Problem: Clinical Measurements: Goal: Respiratory complications will improve Outcome: Progressing   Problem: Activity: Goal: Risk for activity intolerance will decrease Outcome: Progressing   Problem: Nutrition: Goal: Adequate nutrition will be maintained Outcome: Progressing   Problem: Elimination: Goal: Will not experience complications related to bowel motility Outcome: Progressing Goal: Will not experience complications related to urinary retention Outcome: Progressing   Problem: Pain Managment: Goal: General experience of comfort will improve Outcome: Progressing   Problem: Skin Integrity: Goal: Risk for impaired skin integrity will decrease Outcome: Progressing

## 2019-06-21 NOTE — Progress Notes (Signed)
Tamara [name] to be D/C'd  per MD order. Discussed with the patient and all questions fully answered.  VSS, Skin clean, dry and intact without evidence of skin break down, no evidence of skin tears noted.  IV catheter discontinued intact. Site without signs and symptoms of complications. Dressing and pressure applied.  An After Visit Summary was printed and given to the patient. Patient received prescription.  D/c education completed with patient/family including follow up instructions, medication list, d/c activities limitations if indicated, with other d/c instructions as indicated by MD - patient able to verbalize understanding, all questions fully answered.   Patient instructed to return to ED, call 911, or call MD for any changes in condition.   Patient to be escorted via [place], and D/C home via private auto.

## 2019-06-21 NOTE — Discharge Summary (Signed)
Physician Discharge Summary  Patient ID: Tamara [name] MRN: [id number] DOB/AGE: 01-30-1951 68 y.o.  Admit date: 06/20/2019 Discharge date: 06/21/2019  Admission Diagnoses: Panniculitis thighs  Discharge Diagnoses:  Active Problems:   Panniculitis  Discharged Condition: stable  Hospital Course: Post operatively patient had minimal pain and able to tolerate diet. She was instructed on compression, expected bruising over liposuction sites, drain care. Foley catheter maintained for POD#0. Patient will be discharged following ambulation and voiding.  Treatments: surgery: bilateral thigh lipectomy  Discharge Exam: Blood pressure (!) 190/85, pulse (!) 49, temperature 97.7 F (36.5 C), resp. rate 17, height 5\' 5"  (1.651 m), weight 97.7 kg, SpO2 96 %. Incision/Wound: dressings dry intact, JP drains right serous left with old clots in bulb  Disposition: Discharge disposition: 01-Home or Self Care       Discharge Instructions    Call MD for:  redness, tenderness, or signs of infection (pain, swelling, bleeding, redness, odor or green/yellow discharge around incision site)   Complete by: As directed    Call MD for:  temperature >100.5   Complete by: As directed    Discharge instructions   Complete by: As directed    Ok to remove dressings and shower am 7.22.20. Soap and water ok, pat incisions dry. No creams or ointments over incisions. Do not let drains dangle in shower, attach to lanyard or similar.Strip and record drains twice daily and bring log to clinic visit.  TED hose all other times. Keep lower extremities elevated while sitting. Dry dressing as needed over incisions. If able, can replace Ace wraps for thighs as compression, if unable to replace, this is ok.    No house yard work or exercise until cleared by MD.   Patient received pain medication from pain provider. No additional Rx   Driving Restrictions   Complete by: As directed    No driving through follow up visit    Lifting restrictions   Complete by: As directed    No lifting > 5-10 lbs for 2 weeks   Resume previous diet   Complete by: As directed       Follow-up Information    Irene Limbo, MD In 1 week.   Specialty: Plastic Surgery Why: as scheduled Contact information: [address] [place] [address] [phone]           Signed: Irene Limbo 06/21/2019, 8:44 AM

## 2019-06-21 NOTE — Plan of Care (Signed)
  Problem: Education: Goal: Knowledge of General Education information will improve Description: Including pain rating scale, medication(s)/side effects and non-pharmacologic comfort measures Outcome: Completed/Met   Problem: Health Behavior/Discharge Planning: Goal: Ability to manage health-related needs will improve Outcome: Completed/Met   Problem: Clinical Measurements: Goal: Ability to maintain clinical measurements within normal limits will improve Outcome: Completed/Met Goal: Will remain free from infection Outcome: Completed/Met Goal: Diagnostic test results will improve Outcome: Completed/Met Goal: Respiratory complications will improve Outcome: Completed/Met Goal: Cardiovascular complication will be avoided Outcome: Completed/Met   Problem: Activity: Goal: Risk for activity intolerance will decrease Outcome: Completed/Met   Problem: Nutrition: Goal: Adequate nutrition will be maintained Outcome: Completed/Met   Problem: Coping: Goal: Level of anxiety will decrease Outcome: Completed/Met   Problem: Elimination: Goal: Will not experience complications related to bowel motility Outcome: Completed/Met Goal: Will not experience complications related to urinary retention Outcome: Completed/Met   Problem: Pain Managment: Goal: General experience of comfort will improve Outcome: Completed/Met   Problem: Safety: Goal: Ability to remain free from injury will improve Outcome: Completed/Met   Problem: Skin Integrity: Goal: Risk for impaired skin integrity will decrease Outcome: Completed/Met   Problem: Education: Goal: Required Educational Video(s) Outcome: Completed/Met   Problem: Clinical Measurements: Goal: Postoperative complications will be avoided or minimized Outcome: Completed/Met   Problem: Skin Integrity: Goal: Demonstration of wound healing without infection will improve Outcome: Completed/Met

## 2019-06-22 ENCOUNTER — Encounter (HOSPITAL_COMMUNITY): Payer: Self-pay | Admitting: Plastic Surgery

## 2019-06-22 NOTE — Anesthesia Postprocedure Evaluation (Signed)
Anesthesia Post Note  Patient: Tamara [name]  Procedure(s) Performed: BILATERAL THIGH LIPECTOMY (Bilateral Thigh)     Patient location during evaluation: PACU Anesthesia Type: General Level of consciousness: awake and alert Pain management: pain level controlled Vital Signs Assessment: post-procedure vital signs reviewed and stable Respiratory status: spontaneous breathing, nonlabored ventilation, respiratory function stable and patient connected to nasal cannula oxygen Cardiovascular status: blood pressure returned to baseline and stable Postop Assessment: no apparent nausea or vomiting Anesthetic complications: no    Last Vitals:  Vitals:   06/21/19 0500 06/21/19 0614  BP: (!) 189/72 (!) 190/85  Pulse: (!) 50 (!) 49  Resp: 17   Temp: 36.5 C   SpO2: 96%     Last Pain:  Vitals:   06/21/19 0835  TempSrc:   PainSc: 0-No pain                 Velda Wendt

## 2019-07-08 ENCOUNTER — Other Ambulatory Visit: Payer: Self-pay | Admitting: Family Medicine

## 2019-07-08 DIAGNOSIS — K5900 Constipation, unspecified: Secondary | ICD-10-CM

## 2019-07-20 ENCOUNTER — Other Ambulatory Visit: Payer: Self-pay | Admitting: *Deleted

## 2019-07-20 DIAGNOSIS — E559 Vitamin D deficiency, unspecified: Secondary | ICD-10-CM

## 2019-07-20 MED ORDER — VITAMIN D 125 MCG (5000 UT) PO CAPS: 5000.0000 [IU] | ORAL_CAPSULE | Freq: Every day | ORAL | 2 refills | Status: DC

## 2019-07-20 MED ORDER — MECLIZINE HCL 25 MG PO TABS: 25.0000 mg | ORAL_TABLET | Freq: Every day | ORAL | 0 refills | Status: DC | PRN

## 2019-07-21 DIAGNOSIS — L7634 Postprocedural seroma of skin and subcutaneous tissue following other procedure: Secondary | ICD-10-CM | POA: Diagnosis not present

## 2019-07-23 ENCOUNTER — Ambulatory Visit (HOSPITAL_COMMUNITY)
Admission: EM | Admit: 2019-07-23 | Discharge: 2019-07-23 | Disposition: A | Discharge status: Home / Self Care | Payer: Medicare Other | Attending: Emergency Medicine | Admitting: Emergency Medicine

## 2019-07-23 ENCOUNTER — Encounter (HOSPITAL_COMMUNITY): Payer: Self-pay

## 2019-07-23 ENCOUNTER — Other Ambulatory Visit: Payer: Self-pay

## 2019-07-23 DIAGNOSIS — M7989 Other specified soft tissue disorders: Secondary | ICD-10-CM | POA: Diagnosis not present

## 2019-07-23 DIAGNOSIS — L03115 Cellulitis of right lower limb: Secondary | ICD-10-CM

## 2019-07-23 MED ORDER — DOXYCYCLINE HYCLATE 100 MG PO CAPS: 100.0000 mg | ORAL_CAPSULE | Freq: Two times a day (BID) | ORAL | 0 refills | Status: AC

## 2019-07-23 MED ORDER — CEFTRIAXONE SODIUM 1 G IJ SOLR
INTRAMUSCULAR | Status: AC
  Filled 2019-07-23: qty 10

## 2019-07-23 MED ORDER — CEFTRIAXONE SODIUM 1 G IJ SOLR
1.0000 g | Freq: Once | INTRAMUSCULAR | Status: AC
  Administered 2019-07-23: 14:00:00 1 g via INTRAMUSCULAR

## 2019-07-23 NOTE — Discharge Instructions (Signed)
We gave you a shot of rocephin  Continue doxycycline Elevate legs  Outpatient ultrasound on Monday- instructions below  Follow up with oncologist Monday as planned and have look at leg If symptoms worsen or fever returning, increased lightheadedness and dizziness please go to emergency room

## 2019-07-23 NOTE — ED Notes (Addendum)
Wound care provided to both inner thighs and dressing placed. Patient given supplies for home use, tefla pads, 4x4s and paper tape.

## 2019-07-23 NOTE — ED Triage Notes (Signed)
Pt present right leg pain with some swelling and redness. Pt states symptoms started Wednesday. Pt right leg is tender when she apply pressure and or touch it.

## 2019-07-23 NOTE — ED Provider Notes (Signed)
[place]    CSN: [id number] Arrival date & time: 07/23/19  1206      History   Chief Complaint Chief Complaint  Patient presents with   Leg Swelling    HPI Beily Buscemi is a 68 y.o. female history of hypertension, hyperlipidemia, depression, asthma, OSA, presenting today for evaluation of possible cellulitis.  Patient states that she has developed right lower leg pain redness and swelling.  She is 4 weeks out from bilateral lipectomy of thighs.  She has been managing wounds on bilateral medial thighs with dressing changes and slowly packing.  On Wednesday she noted to have a fever up to 102, felt nauseous and has had some diarrhea.  She was seen by her plastic surgeon the following day had drained area on right inferior thigh and initiated on doxycycline.  Since the area around drainage has continued to be red, but has not spread.  She has developed lower leg redness pain and swelling.  She denies previous DVT/PE.  Denies history of tobacco use.  Denies recent travel.  She has been elevating her legs.  She is concerned as she is previously been admitted for cellulitis and is hoping to avoid this.  HPI  Past Medical History:  Diagnosis Date   Arthritis    knees    Asthma    rare;only when around alot of dust-Ventolin inhaler as needed   Breast cancer ([place])     left. States she did not have lymph nodes removed   Bursitis of right shoulder    Cellulitis and abscess of leg    Complication of anesthesia    was told 01/06/14 that airway was small   Depression    takes Effexor and Wellbutrin daily   Hearing loss    History of bronchitis 1966   Hyperlipidemia    Hypertension    takes Coreg and Losartan daily   Joint pain    Joint swelling    Obese    Peripheral edema    takes Furosemide daily as needed and Potassium daily   Personal history of radiation therapy 2015   Radiation 03/08/14-04/26/14   50.4 gray to left breast. Lumpectomy cavity  boosted to 64.4 gray   Restless legs syndrome    takes depakote   Sleep apnea, obstructive    uses CPAP   Wears glasses     Patient Active Problem List   Diagnosis Date Noted   Chronic pain disorder 09/10/2018   Panniculitis 06/18/2018   Asthma, intermittent, uncomplicated [id number]   GERD (gastroesophageal reflux disease) 03/02/2018   Ductal carcinoma in situ (DCIS) of left breast 03/01/2018   Intertriginous candidiasis 10/02/2017   Polyp of sigmoid colon    Special screening for malignant neoplasms, colon    Vitamin D deficiency 02/16/2017   RLS (restless legs syndrome) 09/09/2016   Insomnia 07/11/2016   Anxiety and depression 07/11/2016   Generalized osteoarthritis of multiple sites 07/11/2016   Class 2 obesity with body mass index (BMI) of 36.0 to 36.9 in adult 01/28/2016   Shoulder pain 12/28/2014   Osteopenia 12/28/2014   Hot flashes 08/02/2014   Constipation 08/02/2014   Malignant neoplasm of upper-outer quadrant of left breast in female, estrogen receptor positive ([place]) 03/24/2014   CANDIDIASIS, SKIN 10/15/2009   PURE HYPERCHOLESTEROLEMIA 04/25/2009   Hyperlipidemia, acquired 02/16/2009   Sleep apnea 02/16/2009   Depression 06/18/2007   Essential hypertension 06/18/2007    Past Surgical History:  Procedure Laterality Date   BREAST LUMPECTOMY Left 2015  BREAST LUMPECTOMY WITH NEEDLE LOCALIZATION Left 01/24/2014   Procedure: BREAST LUMPECTOMY WITH NEEDLE LOCALIZATION;  Surgeon: Edward Jolly, MD;  Location: [facility];  Service: General;  Laterality: Left;   COLONOSCOPY     COLONOSCOPY N/A 03/20/2017   Procedure: COLONOSCOPY;  Surgeon: Mauri Pole, MD;  Location: [facility];  Service: Endoscopy;  Laterality: N/A;   DILATATION & CURETTAGE/HYSTEROSCOPY WITH TRUECLEAR N/A 01/06/2014   Procedure: DILATATION & CURETTAGE/HYSTEROSCOPY WITH TRUCLEAR;  Surgeon: Shon Millet II, MD;  Location: [facility];  Service: Gynecology;   Laterality: N/A;   HYSTEROPLASTY  01/2014   INNER EAR SURGERY Bilateral    for hearing loss   JOINT REPLACEMENT Right    Knee   LAPAROSCOPIC GASTRIC SLEEVE RESECTION N/A 01/28/2016   Procedure: LAPAROSCOPIC GASTRIC SLEEVE RESECTION WITH UPPER ENDO;  Surgeon: Excell Seltzer, MD;  Location: [facility];  Service: General;  Laterality: N/A;   LIPOSUCTION WITH LIPOFILLING Bilateral 06/20/2019   Procedure: BILATERAL THIGH LIPECTOMY;  Surgeon: Irene Limbo, MD;  Location: [facility];  Service: Plastics;  Laterality: Bilateral;   PANNICULECTOMY N/A 06/18/2018   Procedure: PANNICULECTOMY;  Surgeon: Irene Limbo, MD;  Location: [place] [facility];  Service: Plastics;  Laterality: N/A;   RE-EXCISION OF BREAST LUMPECTOMY Left 02/02/2014   Procedure: RE-EXCISION OF LEFT BREAST LUMPECTOMY;  Surgeon: Edward Jolly, MD;  Location: [facility];  Service: General;  Laterality: Left;   TUBAL LIGATION      OB History   No obstetric history on file.      Home Medications    Prior to Admission medications   Medication Sig Start Date End Date Taking? Authorizing Provider  albuterol (VENTOLIN HFA) 108 (90 Base) MCG/ACT inhaler Inhale 2 puffs into the lungs every 6 (six) hours as needed for wheezing or shortness of breath. 03/02/18   [place], Betty G, MD  AMITIZA 24 MCG capsule TAKE 1 CAPSULE BY MOUTH TWICE A DAY WITH A MEAL 07/08/19   [place], Betty G, MD  amLODipine (NORVASC) 2.5 MG tablet Take 1 tablet (2.5 mg total) by mouth daily. 06/16/19   [place], Betty G, MD  anastrozole (ARIMIDEX) 1 MG tablet TAKE 1 TABLET BY MOUTH EVERY DAY Patient taking differently: Take 1 mg by mouth daily.  04/18/19   Magrinat, Virgie Dad, MD  aspirin EC 81 MG tablet Take 81 mg by mouth at bedtime.    [provider]  atorvastatin (LIPITOR) 20 MG tablet TAKE 1 TABLET BY MOUTH ONCE DAILY AT 6 PM. Patient taking differently: Take 20 mg by mouth daily at 6 PM.  10/04/18   [place], Betty G, MD  buPROPion [facility] SR)  150 MG 12 hr tablet Take 1 tablet (150 mg total) by mouth daily. 04/15/19   [place], Betty G, MD  carvedilol (COREG) 12.5 MG tablet TAKE 1 TABLET (12.5 MG TOTAL) BY MOUTH 2 (TWO) TIMES DAILY WITH A MEAL. 12/20/18   [place], Betty G, MD  Cholecalciferol (VITAMIN D) 125 MCG (5000 UT) CAPS Take 5,000 Units by mouth daily. 07/20/19   [place], Betty G, MD  clindamycin (CLINDAGEL) 1 % gel Apply topically 2 (two) times daily. Patient taking differently: Apply 1 application topically 2 (two) times daily as needed (rash).  01/08/18   Magrinat, Virgie Dad, MD  doxycycline (VIBRAMYCIN) 100 MG capsule Take 1 capsule (100 mg total) by mouth 2 (two) times daily for 7 days. 07/23/19 07/30/19  Zylah Elsbernd C, PA-C  flintstones complete (FLINTSTONES) 60 MG chewable tablet Chew 1 tablet by mouth daily.  [provider]  furosemide (LASIX) 40 MG tablet Take 0.5 tablets (20 mg total) by mouth daily as needed for edema. 03/21/19   [place], Betty G, MD  HYDROcodone-acetaminophen (NORCO/VICODIN) 5-325 MG tablet Take 1 tablet by mouth every 6 (six) hours as needed for moderate pain (To take more frequent after surgery). 06/15/19   [place], Betty G, MD  ibuprofen (ADVIL) 200 MG tablet Take 400 mg by mouth every 6 (six) hours as needed for headache or moderate pain.    [provider]  Ibuprofen-diphenhydrAMINE HCl (ADVIL PM) 200-25 MG CAPS Take 2 tablets by mouth at bedtime.    [provider]  losartan (COZAAR) 100 MG tablet TAKE 1 TABLET BY MOUTH ONCE DAILY Patient not taking: Reported on 06/14/2019 04/06/18   [place], Betty G, MD  meclizine (ANTIVERT) 25 MG tablet Take 1 tablet (25 mg total) by mouth daily as needed for dizziness. 07/20/19   [place], Betty G, MD  Melatonin 10 MG TABS Take 50 mg by mouth at bedtime.    [provider]  nystatin cream (MYCOSTATIN) Apply 1 application topically daily as needed (rash).     [provider]  pantoprazole (PROTONIX) 40 MG tablet Take 1 tablet (40 mg  total) by mouth daily. 02/21/19   [place], Betty G, MD  triamcinolone cream (KENALOG) 0.1 % Apply 1 application topically 2 (two) times daily as needed for rash. 05/05/19   [provider]  venlafaxine XR (EFFEXOR-XR) 150 MG 24 hr capsule TAKE 1 CAPSULE BY MOUTH EVERY DAY WITH BREAKFAST Patient taking differently: Take 150 mg by mouth daily.  04/18/19   Magrinat, Virgie Dad, MD    Family History Family History  Problem Relation Age of Onset   Dementia Mother    Heart attack Mother    Esophageal cancer Father        also had stomach cancer   Heart attack Father    Asthma Other    Hypertension Other    Thyroid disease Other    Heart attack Other    Throat cancer Paternal Grandfather     Social History Social History   Tobacco Use   Smoking status: Never Smoker   Smokeless tobacco: Never Used  Substance Use Topics   Alcohol use: No   Drug use: No     Allergies   Patient has no known allergies.   Review of Systems Review of Systems  Constitutional: Negative for fatigue and fever.  Eyes: Negative for visual disturbance.  Respiratory: Negative for shortness of breath.   Cardiovascular: Positive for leg swelling. Negative for chest pain.  Gastrointestinal: Negative for abdominal pain, nausea and vomiting.  Musculoskeletal: Negative for arthralgias and joint swelling.  Skin: Positive for color change and wound. Negative for rash.  Neurological: Negative for dizziness, weakness, light-headedness and headaches.     Physical Exam Triage Vital Signs ED Triage Vitals [07/23/19 1223]  Enc Vitals Group     BP (!) 173/75     Pulse Rate 66     Resp 18     Temp 97.8 F (36.6 C)     Temp Source Tympanic     SpO2 99 %     Weight      Height      Head Circumference      Peak Flow      Pain Score      Pain Loc      Pain Edu?      Excl. in [place]?  No data found.  Updated Vital Signs BP (!) 173/75 (BP Location: Left Arm)    Pulse 66    Temp 97.8 F (36.6  C) (Tympanic)    Resp 18    SpO2 99%   Visual Acuity Right Eye Distance:   Left Eye Distance:   Bilateral Distance:    Right Eye Near:   Left Eye Near:    Bilateral Near:     Physical Exam Vitals signs and nursing note reviewed.  Constitutional:      General: She is not in acute distress.    Appearance: She is well-developed.  HENT:     Head: Normocephalic and atraumatic.  Eyes:     Conjunctiva/sclera: Conjunctivae normal.  Neck:     Musculoskeletal: Neck supple.  Cardiovascular:     Rate and Rhythm: Normal rate and regular rhythm.     Heart sounds: No murmur.  Pulmonary:     Effort: Pulmonary effort is normal. No respiratory distress.     Breath sounds: Normal breath sounds.  Abdominal:     Palpations: Abdomen is soft.     Tenderness: There is no abdominal tenderness.  Skin:    General: Skin is warm and dry.     Comments: See pictures below, bilateral thighs with healing wound incisions extending proximately with large open wound with exposed subcutaneous tissue/adipose, bruising clearish yellowish fluid.  Distal wound area of right leg with erythema and warmth  Right lower leg with erythema warmth and swelling and tenderness anteriorly and does extend posteriorly into calf and into dorsum of foot.  Dorsalis pedis 2+  Neurological:     Mental Status: She is alert.            UC Treatments / Results  Labs (all labs ordered are listed, but only abnormal results are displayed) Labs Reviewed - No data to display  EKG   Radiology No results found.  Procedures Procedures (including critical care time)  Medications Ordered in UC Medications  cefTRIAXone (ROCEPHIN) injection 1 g (1 g Intramuscular Given 07/23/19 1333)  cefTRIAXone (ROCEPHIN) 1 g injection (has no administration in time range)    Initial Impression / Assessment and Plan / UC Course  I have reviewed the triage vital signs and the nursing notes.  Pertinent labs & imaging results that  were available during my care of the patient were reviewed by me and considered in my medical decision making (see chart for details).     Patient with worsening right lower leg pain redness and swelling.  She is currently on doxycycline, but symptoms seem to be progressing.  Did recently have surgery and has history of cancer, but otherwise negative risk factors for DVT.  Will set up outpatient DVT study.  We will have her continue doxycycline, will provide Rocephin IM prior to discharge.  Advised if symptoms progressing or worsening to follow-up in the emergency room over the next 24 to 48 hours for DVT Study can be completed on Monday. Follow-up with oncologist on Monday as planned, if able to move up appointment with plastic surgeon to this week. Discussed strict return precautions. Patient verbalized understanding and is agreeable with plan.  Final Clinical Impressions(s) / UC Diagnoses   Final diagnoses:  Right leg swelling  Cellulitis of right lower extremity     Discharge Instructions     We gave you a shot of rocephin  Continue doxycycline Elevate legs  Outpatient ultrasound on Monday- instructions below  Follow up with oncologist Monday as  planned and have look at leg If symptoms worsen or fever returning, increased lightheadedness and dizziness please go to emergency room    ED Prescriptions    Medication Sig Dispense Auth. Provider   doxycycline (VIBRAMYCIN) 100 MG capsule Take 1 capsule (100 mg total) by mouth 2 (two) times daily for 7 days. 14 capsule Breandan People C, PA-C     Controlled Substance Prescriptions [place] Controlled Substance Registry consulted? Not Applicable   Janith L[place], [place] 07/23/19 1441

## 2019-07-24 NOTE — Progress Notes (Signed)
[facility]  [phone] [phone]   ID: Tamara [name] OB: [birth year]  MR#: [id number]  [id number]  Patient Care Team: Martinique, Betty G, MD as PCP - General (Family Medicine) Maliyah Willets, Virgie Dad, MD as Consulting Physician (Oncology) Allyn Kenner, MD (Dermatology) Excell Seltzer, MD as Consulting Physician (General Surgery) Everlene Farrier, MD as Consulting Physician (Obstetrics and Gynecology) Nickie Retort, MD as Consulting Physician (Urology) Susa Day, MD as Consulting Physician (Orthopedic Surgery)  CHIEF COMPLAINT: left breast ductal carcinoma in situ.  CURRENT TREATMENT: Completing 5 years of anastrozole    INTERVAL HISTORY: Tamara [name] returns today for follow-up of her estrogen receptor positive breast cancer.   She continues on anastrozole, with good tolerance.  Hot flashes have not been a big problem for her or vaginal dryness.  Since her last visit, she underwent bilateral diagnostic mammography with tomography at [facility] on 09/21/2018 showing: breast density category B; no evidence of malignancy in either breast.  She also underwent bone density testing on 11/04/2018. This showed a T-score of -1.6, which is considered osteopenic.  More recently, on 06/20/2019 she underwent bilateral thigh lipectomy under Dr. Iran Planas.  She has had some problems with this including swelling erythema and pain.  She is currently being treated with doxycycline for this.  REVIEW OF SYSTEMS: Tamara [name] is not able to exercise regularly at present.  The leg problem from the recent surgery is significantly affecting her mobility.  She denies unusual headaches, visual changes, nausea, vomiting, balance issues, or falls.  A detailed review of systems today was otherwise stable.   BREAST CANCER HISTORY: From the original intake note:  Tamara [name] had routine screening mammography at the [facility] Center 10/03/2013 showing suspicious calcifications in  the left breast. Additional views 10/25/2013 confirmed a 2.6 area of pleomorphic calcification in the subareolar left breast. There was no associated mass. Needle core biopsy could not be performed for body habitus reasons. Excisional biopsy of this area 01/24/2014 showed ([id number]) ductal carcinoma in situ, high-grade, estrogen receptor 91% positive with strong staining intensity, progesterone receptor negative. There were multiple margins that were "very close" (less than half a millimeter).  Accordingly on 02/02/2014 the patient underwent further left breast excision. Margins remained close, but now are clear. The patient had an uneventful postoperative course and t proceeded to adjuvant radiation, which will be completed late May 2015.  Her subsequent history is as detailed below.   PAST MEDICAL HISTORY: Past Medical History:  Diagnosis Date  . Arthritis    knees   . Asthma    rare;only when around alot of dust-Ventolin inhaler as needed  . Breast cancer ([place])     left. States she did not have lymph nodes removed  . Bursitis of right shoulder   . Cellulitis and abscess of leg   . Complication of anesthesia    was told 01/06/14 that airway was small  . Depression    takes Effexor and Wellbutrin daily  . Hearing loss   . History of bronchitis 1966  . Hyperlipidemia   . Hypertension    takes Coreg and Losartan daily  . Joint pain   . Joint swelling   . Obese   . Peripheral edema    takes Furosemide daily as needed and Potassium daily  . Personal history of radiation therapy 2015  . Radiation 03/08/14-04/26/14   50.4 gray to left breast. Lumpectomy cavity boosted to 64.4 gray  . Restless legs syndrome    takes depakote  .  Sleep apnea, obstructive    uses CPAP  . Wears glasses     PAST SURGICAL HISTORY: Past Surgical History:  Procedure Laterality Date  . BREAST LUMPECTOMY Left 2015  . BREAST LUMPECTOMY WITH NEEDLE LOCALIZATION Left 01/24/2014   Procedure: BREAST LUMPECTOMY  WITH NEEDLE LOCALIZATION;  Surgeon: Edward Jolly, MD;  Location: [facility];  Service: General;  Laterality: Left;  . COLONOSCOPY    . COLONOSCOPY N/A 03/20/2017   Procedure: COLONOSCOPY;  Surgeon: Mauri Pole, MD;  Location: [facility];  Service: Endoscopy;  Laterality: N/A;  . DILATATION & CURETTAGE/HYSTEROSCOPY WITH TRUECLEAR N/A 01/06/2014   Procedure: DILATATION & CURETTAGE/HYSTEROSCOPY WITH TRUCLEAR;  Surgeon: Shon Millet II, MD;  Location: [facility];  Service: Gynecology;  Laterality: N/A;  . HYSTEROPLASTY  01/2014  . INNER EAR SURGERY Bilateral    for hearing loss  . JOINT REPLACEMENT Right    Knee  . LAPAROSCOPIC GASTRIC SLEEVE RESECTION N/A 01/28/2016   Procedure: LAPAROSCOPIC GASTRIC SLEEVE RESECTION WITH UPPER ENDO;  Surgeon: Excell Seltzer, MD;  Location: [facility];  Service: General;  Laterality: N/A;  . LIPOSUCTION WITH LIPOFILLING Bilateral 06/20/2019   Procedure: BILATERAL THIGH LIPECTOMY;  Surgeon: Irene Limbo, MD;  Location: [facility];  Service: Plastics;  Laterality: Bilateral;  . PANNICULECTOMY N/A 06/18/2018   Procedure: PANNICULECTOMY;  Surgeon: Irene Limbo, MD;  Location: [facility];  Service: Plastics;  Laterality: N/A;  . RE-EXCISION OF BREAST LUMPECTOMY Left 02/02/2014   Procedure: RE-EXCISION OF LEFT BREAST LUMPECTOMY;  Surgeon: Edward Jolly, MD;  Location: [facility];  Service: General;  Laterality: Left;  . TUBAL LIGATION      FAMILY HISTORY Family History  Problem Relation Age of Onset  . Dementia Mother   . Heart attack Mother   . Esophageal cancer Father        also had stomach cancer  . Heart attack Father   . Asthma Other   . Hypertension Other   . Thyroid disease Other   . Heart attack Other   . Throat cancer Paternal Grandfather    the patient's father died in his late 8s from throat and stomach cancer. The patient's mother died in her early 20s with congestive heart failure and dementia. The patient had 2 brothers,  no sisters. There is no history of breast or ovarian cancer in the family   GYNECOLOGIC HISTORY:   Menarche age 77, first live birth age 56, she is [place] P3. She went through menopause more than 15 years ago. She did not take hormone replacement. 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 She is divorced and lives by herself, with a mixed breed dog.. [name] works as an Clinical biochemist for [employer]. Son [name] works as a Printmaker for the same company. Daughter [name] is an Scientist, physiological for [employer]. Derhonda has 4 grandchildren " "by love", 3 "by blood"--as of April 2019 the patient was working mornings caring for the mother and granddaughter of a neighbor who have both Alzheimer's disease (the granddaughter also has retardation).   ADVANCED DIRECTIVES:  in place. The patient has named her son [name] as her healthcare power of attorney. He can be reached at [phone]   HEALTH MAINTENANCE: Social History   Tobacco Use  . Smoking status: Never Smoker  . Smokeless tobacco: Never Used  Substance Use Topics  . Alcohol use:  No  . Drug use: No     Colonoscopy: 2008/[facility]   PAP: 2014   [facility]   Lipid panel:  No Known Allergies  Current Outpatient Medications  Medication Sig Dispense Refill  . albuterol (VENTOLIN HFA) 108 (90 Base) MCG/ACT inhaler Inhale 2 puffs into the lungs every 6 (six) hours as needed for wheezing or shortness of breath. 36 each 2  . AMITIZA 24 MCG capsule TAKE 1 CAPSULE BY MOUTH TWICE A DAY WITH A MEAL 60 capsule 1  . amLODipine (NORVASC) 2.5 MG tablet Take 1 tablet (2.5 mg total) by mouth daily. 90 tablet 2  . anastrozole (ARIMIDEX) 1 MG tablet TAKE 1 TABLET BY MOUTH EVERY DAY (Patient taking differently: Take 1 mg by mouth daily. ) 30 tablet 2  . aspirin EC 81 MG tablet Take 81 mg by  mouth at bedtime.    Marland Kitchen atorvastatin (LIPITOR) 20 MG tablet TAKE 1 TABLET BY MOUTH ONCE DAILY AT 6 PM. (Patient taking differently: Take 20 mg by mouth daily at 6 PM. ) 90 tablet 1  . buPROPion (WELLBUTRIN SR) 150 MG 12 hr tablet Take 1 tablet (150 mg total) by mouth daily. 90 tablet 1  . carvedilol (COREG) 12.5 MG tablet TAKE 1 TABLET (12.5 MG TOTAL) BY MOUTH 2 (TWO) TIMES DAILY WITH A MEAL. 180 tablet 2  . Cholecalciferol (VITAMIN D) 125 MCG (5000 UT) CAPS Take 5,000 Units by mouth daily. 90 capsule 2  . clindamycin (CLINDAGEL) 1 % gel Apply topically 2 (two) times daily. (Patient taking differently: Apply 1 application topically 2 (two) times daily as needed (rash). ) 30 g 0  . doxycycline (VIBRAMYCIN) 100 MG capsule Take 1 capsule (100 mg total) by mouth 2 (two) times daily for 7 days. 14 capsule 0  . flintstones complete (FLINTSTONES) 60 MG chewable tablet Chew 1 tablet by mouth daily.    . furosemide (LASIX) 40 MG tablet Take 0.5 tablets (20 mg total) by mouth daily as needed for edema. 45 tablet 2  . HYDROcodone-acetaminophen (NORCO/VICODIN) 5-325 MG tablet Take 1 tablet by mouth every 6 (six) hours as needed for moderate pain (To take more frequent after surgery). 100 tablet 0  . ibuprofen (ADVIL) 200 MG tablet Take 400 mg by mouth every 6 (six) hours as needed for headache or moderate pain.    . Ibuprofen-diphenhydrAMINE HCl (ADVIL PM) 200-25 MG CAPS Take 2 tablets by mouth at bedtime.    Marland Kitchen losartan (COZAAR) 100 MG tablet TAKE 1 TABLET BY MOUTH ONCE DAILY (Patient not taking: Reported on 06/14/2019) 90 tablet 1  . meclizine (ANTIVERT) 25 MG tablet Take 1 tablet (25 mg total) by mouth daily as needed for dizziness. 30 tablet 0  . Melatonin 10 MG TABS Take 50 mg by mouth at bedtime.    Marland Kitchen nystatin cream (MYCOSTATIN) Apply 1 application topically daily as needed (rash).     . pantoprazole (PROTONIX) 40 MG tablet Take 1 tablet (40 mg total) by mouth daily. 90 tablet 1  . triamcinolone cream  (KENALOG) 0.1 % Apply 1 application topically 2 (two) times daily as needed for rash.    . venlafaxine XR (EFFEXOR-XR) 150 MG 24 hr capsule TAKE 1 CAPSULE BY MOUTH EVERY DAY WITH BREAKFAST (Patient taking differently: Take 150 mg by mouth daily. ) 30 capsule 2   No current facility-administered medications for this visit.     OBJECTIVE: Middle-aged white woman who appears older than stated age  23:  07/25/19 1621  BP: (!) 156/79  Pulse: 62  Resp: 18  Temp: 98 F (36.7 C)  SpO2: 100%     Body mass index is 35.06 kg/m.    ECOG FS:1 - Symptomatic but completely ambulatory  Sclerae unicteric, EOMs intact Wearing a mask No cervical or supraclavicular adenopathy Lungs no rales or rhonchi Heart regular rate and rhythm Abd soft, nontender, positive bowel sounds MSK status post recent bilateral thigh surgery.  There is erythema and swelling that is relatively well marginated associated with the right thigh and this is imaged below. Neuro: nonfocal, well oriented, appropriate affect Breasts: The right breast is benign.  The left breast is status post lumpectomy and radiation without evidence of local recurrence.  Both axillae are benign.         LAB RESULTS: I No results found for: SPEP  Lab Results  Component Value Date   WBC 7.0 06/15/2019   NEUTROABS 3.5 06/15/2019   HGB 13.4 06/15/2019   HCT 41.4 06/15/2019   MCV 88.1 06/15/2019   PLT 207 06/15/2019      Chemistry      Component Value Date/Time   NA 142 06/15/2019 1600   NA 140 02/13/2016 1411   K 3.2 (L) 06/15/2019 1600   K 4.4 02/13/2016 1411   CL 107 06/15/2019 1600   CO2 26 06/15/2019 1600   CO2 28 02/13/2016 1411   BUN 21 06/15/2019 1600   BUN 30.8 (H) 02/13/2016 1411   CREATININE 0.77 06/15/2019 1600   CREATININE 0.9 02/13/2016 1411      Component Value Date/Time   CALCIUM 8.8 (L) 06/15/2019 1600   CALCIUM 9.9 02/13/2016 1411   ALKPHOS 76 06/11/2018 1200   ALKPHOS 61 02/13/2016 1411   AST 18  06/11/2018 1200   AST 21 02/13/2016 1411   ALT 17 06/11/2018 1200   ALT 32 02/13/2016 1411   BILITOT 0.7 06/11/2018 1200   BILITOT 0.44 02/13/2016 1411       No results found for: LABCA2  No components found for: LABCA125  No results for input(s): INR in the last 168 hours.  Urinalysis    Component Value Date/Time   COLORURINE YELLOW 06/11/2018 1030   APPEARANCEUR CLEAR 06/11/2018 1030   LABSPEC 1.025 06/11/2018 1030   PHURINE 6.0 06/11/2018 1030   GLUCOSEU NEGATIVE 06/11/2018 1030   HGBUR NEGATIVE 06/11/2018 1030   HGBUR negative 03/19/2010 0815   BILIRUBINUR NEGATIVE 06/11/2018 1030   BILIRUBINUR n 09/21/2014 1024   KETONESUR TRACE (A) 06/11/2018 1030   PROTEINUR n 09/21/2014 1024   PROTEINUR NEGATIVE 11/14/2009 1155   UROBILINOGEN 1.0 06/11/2018 1030   NITRITE NEGATIVE 06/11/2018 1030   LEUKOCYTESUR NEGATIVE 06/11/2018 1030    STUDIES: Vas [place] Lower Extremity Venous (dvt)  Result Date: 07/25/2019  Lower Venous Study Indications: Edema/ redness, hx cellulitis.  Limitations: Body habitus and bandages. Comparison Study: no prior Performing Technologist: June Leap RDMS, RVT  Examination Guidelines: A complete evaluation includes B-mode imaging, spectral Doppler, color Doppler, and power Doppler as needed of all accessible portions of each vessel. Bilateral testing is considered an integral part of a complete examination. Limited examinations for reoccurring indications may be performed as noted.  +---------+---------------+---------+-----------+----------+--------------+ RIGHT    CompressibilityPhasicitySpontaneityPropertiesThrombus Aging +---------+---------------+---------+-----------+----------+--------------+ CFV      Full           Yes      Yes                                 +---------+---------------+---------+-----------+----------+--------------+  SFJ      Full                                                         +---------+---------------+---------+-----------+----------+--------------+ FV Prox  Full                                                        +---------+---------------+---------+-----------+----------+--------------+ FV Mid                                                Not visualized +---------+---------------+---------+-----------+----------+--------------+ FV Distal                                             Not visualized +---------+---------------+---------+-----------+----------+--------------+ PFV                                                   Not visualized +---------+---------------+---------+-----------+----------+--------------+ POP      Full           Yes      Yes                                 +---------+---------------+---------+-----------+----------+--------------+ PTV      Full                                                        +---------+---------------+---------+-----------+----------+--------------+ PERO     Full                                                        +---------+---------------+---------+-----------+----------+--------------+     Summary: Right: There is no evidence of deep vein thrombosis in the lower extremity. However, portions of this examination were limited- see technologist comments above. No cystic structure found in the popliteal fossa.  *See table(s) above for measurements and observations.    Preliminary      ASSESSMENT: 68 y.o. status post left lumpectomy 01/24/2014 for ductal carcinoma in situ, high-grade, estrogen receptor 91% positive, progesterone receptor negative, with positive margins  (1) status post further left breast excision for margin clearance 02/02/2014, achieving close but negative margins.  (2) undergoing radiation therapy to the left breast completed 04/26/2014  (3) anastrozole started May 2015  (a) bone density scan November 2015 showed a T score of -2.0  (b) vitamin D level  III/XX/MMXVIII was 46.01   (4) 3.8 cm  Right adrenal mass noted on lumbar MRI from orthopedics 12/19/2017  (a) negative endocrine activity per urologic workup 01/18/2018  (b) CT of the abdomen and pelvis with and without contrast 01/22/2018 by Dr. Su Grand shows a 4.1 cm right adrenal mass with washout studies consistent with an adrenal adenoma, and a similar 1.1 cm nodule in the left adrenal gland.  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  PLAN: Tamara [name] is now 5-1/2 years out from definitive surgery for her breast cancer without evidence of disease recurrence.  This is very favorable.  She has been on anastrozole a little over 5 years.  I am comfortable with her stopping at this point.  Unfortunately she recently had this renewed.  We discussed her just completing what she has or stopping and she would prefer to stop.  She will bring the extra unopened bottles to [place] so we can use them with other patients who may not be able to afford it through our pharmacy.  She is ready to "graduate".  However she would like to participate in our survivorship program and I am making her a return appointment November of next year.  It would be appropriate for her to have a skin exam yearly and we could consider repeating a CT of the abdomen and pelvis or an MRI of the kidneys and adrenals next year if she has had no further imaging, but at this point especially with the pandemic going on the fewer visits to medical facilities appear to [place] best.  She knows to call for any other issue that may develop before the next visit.  Tamara [name], Virgie Dad, MD  07/25/19 6:12 PM Medical Oncology and Hematology [facility] [address] [place], [place] [address] Tel.  [phone]    Fax. [phone]   I, Wilburn Mylar, am acting as scribe for Dr. Virgie Dad. Tamara [name].  I, Lurline Del MD, have reviewed the above documentation for accuracy and completeness, and I agree with the above.

## 2019-07-25 ENCOUNTER — Ambulatory Visit (HOSPITAL_COMMUNITY)
Admission: RE | Admit: 2019-07-25 | Discharge: 2019-07-25 | Disposition: A | Discharge status: Home / Self Care | Payer: Medicare Other | Source: Ambulatory Visit | Attending: Emergency Medicine | Admitting: Emergency Medicine

## 2019-07-25 ENCOUNTER — Inpatient Hospital Stay: Payer: Medicare Other | Attending: Oncology | Admitting: Oncology

## 2019-07-25 ENCOUNTER — Other Ambulatory Visit: Payer: Self-pay

## 2019-07-25 VITALS — BP 156/79 | HR 62 | Temp 98.0°F | Resp 18 | Ht 65.0 in | Wt 210.7 lb

## 2019-07-25 DIAGNOSIS — Z9989 Dependence on other enabling machines and devices: Secondary | ICD-10-CM | POA: Diagnosis not present

## 2019-07-25 DIAGNOSIS — M1990 Unspecified osteoarthritis, unspecified site: Secondary | ICD-10-CM | POA: Insufficient documentation

## 2019-07-25 DIAGNOSIS — Z923 Personal history of irradiation: Secondary | ICD-10-CM | POA: Diagnosis not present

## 2019-07-25 DIAGNOSIS — Z79811 Long term (current) use of aromatase inhibitors: Secondary | ICD-10-CM | POA: Diagnosis not present

## 2019-07-25 DIAGNOSIS — M79661 Pain in right lower leg: Secondary | ICD-10-CM | POA: Insufficient documentation

## 2019-07-25 DIAGNOSIS — E785 Hyperlipidemia, unspecified: Secondary | ICD-10-CM | POA: Insufficient documentation

## 2019-07-25 DIAGNOSIS — M8580 Other specified disorders of bone density and structure, unspecified site: Secondary | ICD-10-CM | POA: Insufficient documentation

## 2019-07-25 DIAGNOSIS — D0512 Intraductal carcinoma in situ of left breast: Secondary | ICD-10-CM | POA: Diagnosis not present

## 2019-07-25 DIAGNOSIS — Z170 Estrogen receptor positive status [ER+]: Secondary | ICD-10-CM | POA: Insufficient documentation

## 2019-07-25 DIAGNOSIS — I10 Essential (primary) hypertension: Secondary | ICD-10-CM | POA: Insufficient documentation

## 2019-07-25 DIAGNOSIS — C50412 Malignant neoplasm of upper-outer quadrant of left female breast: Secondary | ICD-10-CM

## 2019-07-25 DIAGNOSIS — Z7982 Long term (current) use of aspirin: Secondary | ICD-10-CM | POA: Diagnosis not present

## 2019-07-25 DIAGNOSIS — M7989 Other specified soft tissue disorders: Secondary | ICD-10-CM | POA: Diagnosis not present

## 2019-07-25 DIAGNOSIS — Z800 Family history of malignant neoplasm of digestive organs: Secondary | ICD-10-CM | POA: Insufficient documentation

## 2019-07-25 DIAGNOSIS — Z79899 Other long term (current) drug therapy: Secondary | ICD-10-CM | POA: Insufficient documentation

## 2019-07-25 DIAGNOSIS — G4733 Obstructive sleep apnea (adult) (pediatric): Secondary | ICD-10-CM | POA: Diagnosis not present

## 2019-07-25 DIAGNOSIS — R52 Pain, unspecified: Secondary | ICD-10-CM

## 2019-07-25 NOTE — Progress Notes (Signed)
RLE venous duplex       has been completed. Preliminary results can be found under CV proc through chart review. Aairah Negrette, BS, RDMS, RVT   

## 2019-07-26 ENCOUNTER — Encounter: Payer: Medicare Other | Admitting: Family Medicine

## 2019-07-26 ENCOUNTER — Telehealth: Payer: Medicare Other

## 2019-07-26 ENCOUNTER — Inpatient Hospital Stay: Admission: RE | Admit: 2019-07-26 | Payer: Medicare Other | Source: Ambulatory Visit

## 2019-07-26 ENCOUNTER — Telehealth: Payer: Self-pay | Admitting: Oncology

## 2019-07-26 NOTE — Telephone Encounter (Signed)
I left a message regarding schedule  

## 2019-07-26 NOTE — Progress Notes (Deleted)
Virtual Visit via Video Note   I connected with@on  {(***)}@ CHLAPPTTIME@ by a video enabled telemedicine application and verified that I am speaking with the correct person using two identifiers.  Location patient: home Location provider:work or home office Persons participating in the virtual visit: patient, provider  I discussed the limitations of evaluation and management by telemedicine and the availability of in person appointments. The patient expressed understanding and agreed to proceed.   HPI:    ROS: See pertinent positives and negatives per HPI.  Past Medical History:  Diagnosis Date  . Arthritis    knees   . Asthma    rare;only when around alot of dust-Ventolin inhaler as needed  . Breast cancer ([place])     left. States she did not have lymph nodes removed  . Bursitis of right shoulder   . Cellulitis and abscess of leg   . Complication of anesthesia    was told 01/06/14 that airway was small  . Depression    takes Effexor and Wellbutrin daily  . Hearing loss   . History of bronchitis 1966  . Hyperlipidemia   . Hypertension    takes Coreg and Losartan daily  . Joint pain   . Joint swelling   . Obese   . Peripheral edema    takes Furosemide daily as needed and Potassium daily  . Personal history of radiation therapy 2015  . Radiation 03/08/14-04/26/14   50.4 gray to left breast. Lumpectomy cavity boosted to 64.4 gray  . Restless legs syndrome    takes depakote  . Sleep apnea, obstructive    uses CPAP  . Wears glasses     Past Surgical History:  Procedure Laterality Date  . BREAST LUMPECTOMY Left 2015  . BREAST LUMPECTOMY WITH NEEDLE LOCALIZATION Left 01/24/2014   Procedure: BREAST LUMPECTOMY WITH NEEDLE LOCALIZATION;  Surgeon: Edward Jolly, MD;  Location: [facility];  Service: General;  Laterality: Left;  . COLONOSCOPY    . COLONOSCOPY N/A 03/20/2017   Procedure: COLONOSCOPY;  Surgeon: Mauri Pole, MD;  Location: [facility];  Service: Endoscopy;   Laterality: N/A;  . DILATATION & CURETTAGE/HYSTEROSCOPY WITH TRUECLEAR N/A 01/06/2014   Procedure: DILATATION & CURETTAGE/HYSTEROSCOPY WITH TRUCLEAR;  Surgeon: Shon Millet II, MD;  Location: [facility];  Service: Gynecology;  Laterality: N/A;  . HYSTEROPLASTY  01/2014  . INNER EAR SURGERY Bilateral    for hearing loss  . JOINT REPLACEMENT Right    Knee  . LAPAROSCOPIC GASTRIC SLEEVE RESECTION N/A 01/28/2016   Procedure: LAPAROSCOPIC GASTRIC SLEEVE RESECTION WITH UPPER ENDO;  Surgeon: Excell Seltzer, MD;  Location: [facility];  Service: General;  Laterality: N/A;  . LIPOSUCTION WITH LIPOFILLING Bilateral 06/20/2019   Procedure: BILATERAL THIGH LIPECTOMY;  Surgeon: Irene Limbo, MD;  Location: [facility];  Service: Plastics;  Laterality: Bilateral;  . PANNICULECTOMY N/A 06/18/2018   Procedure: PANNICULECTOMY;  Surgeon: Irene Limbo, MD;  Location: [facility];  Service: Plastics;  Laterality: N/A;  . RE-EXCISION OF BREAST LUMPECTOMY Left 02/02/2014   Procedure: RE-EXCISION OF LEFT BREAST LUMPECTOMY;  Surgeon: Edward Jolly, MD;  Location: [facility];  Service: General;  Laterality: Left;  . TUBAL LIGATION      Family History  Problem Relation Age of Onset  . Dementia Mother   . Heart attack Mother   . Esophageal cancer Father        also had stomach cancer  . Heart attack Father   . Asthma Other   . Hypertension Other   .  Thyroid disease Other   . Heart attack Other   . Throat cancer Paternal Grandfather     Social History   Socioeconomic History  . Marital status: Divorced    Spouse name: Not on file  . Number of children: 3  . Years of education: Not on file  . Highest education level: Not on file  Occupational History  . Occupation: Retail banker: [employer]  Social Needs  . Financial resource strain: Not on file  . Food insecurity    Worry: Not on file    Inability: Not on file  . Transportation needs    Medical: Not on  file    Non-medical: Not on file  Tobacco Use  . Smoking status: Never Smoker  . Smokeless tobacco: Never Used  Substance and Sexual Activity  . Alcohol use: No  . Drug use: No  . Sexual activity: Not Currently    Birth control/protection: Post-menopausal  Lifestyle  . Physical activity    Days per week: Not on file    Minutes per session: Not on file  . Stress: Not on file  Relationships  . Social Herbalist on phone: Not on file    Gets together: Not on file    Attends religious service: Not on file    Active member of club or organization: Not on file    Attends meetings of clubs or organizations: Not on file    Relationship status: Not on file  . Intimate partner violence    Fear of current or ex partner: Not on file    Emotionally abused: Not on file    Physically abused: Not on file    Forced sexual activity: Not on file  Other Topics Concern  . Not on file  Social History Narrative  . Not on file      Current Outpatient Medications:  .  albuterol (VENTOLIN HFA) 108 (90 Base) MCG/ACT inhaler, Inhale 2 puffs into the lungs every 6 (six) hours as needed for wheezing or shortness of breath., Disp: 36 each, Rfl: 2 .  AMITIZA 24 MCG capsule, TAKE 1 CAPSULE BY MOUTH TWICE A DAY WITH A MEAL, Disp: 60 capsule, Rfl: 1 .  amLODipine (NORVASC) 2.5 MG tablet, Take 1 tablet (2.5 mg total) by mouth daily., Disp: 90 tablet, Rfl: 2 .  anastrozole (ARIMIDEX) 1 MG tablet, TAKE 1 TABLET BY MOUTH EVERY DAY (Patient taking differently: Take 1 mg by mouth daily. ), Disp: 30 tablet, Rfl: 2 .  aspirin EC 81 MG tablet, Take 81 mg by mouth at bedtime., Disp: , Rfl:  .  atorvastatin (LIPITOR) 20 MG tablet, TAKE 1 TABLET BY MOUTH ONCE DAILY AT 6 PM. (Patient taking differently: Take 20 mg by mouth daily at 6 PM. ), Disp: 90 tablet, Rfl: 1 .  buPROPion (WELLBUTRIN SR) 150 MG 12 hr tablet, Take 1 tablet (150 mg total) by mouth daily., Disp: 90 tablet, Rfl: 1 .  carvedilol (COREG) 12.5  MG tablet, TAKE 1 TABLET (12.5 MG TOTAL) BY MOUTH 2 (TWO) TIMES DAILY WITH A MEAL., Disp: 180 tablet, Rfl: 2 .  Cholecalciferol (VITAMIN D) 125 MCG (5000 UT) CAPS, Take 5,000 Units by mouth daily., Disp: 90 capsule, Rfl: 2 .  clindamycin (CLINDAGEL) 1 % gel, Apply topically 2 (two) times daily. (Patient taking differently: Apply 1 application topically 2 (two) times daily as needed (rash). ), Disp: 30 g, Rfl: 0 .  doxycycline (VIBRAMYCIN) 100  MG capsule, Take 1 capsule (100 mg total) by mouth 2 (two) times daily for 7 days., Disp: 14 capsule, Rfl: 0 .  flintstones complete (FLINTSTONES) 60 MG chewable tablet, Chew 1 tablet by mouth daily., Disp: , Rfl:  .  furosemide (LASIX) 40 MG tablet, Take 0.5 tablets (20 mg total) by mouth daily as needed for edema., Disp: 45 tablet, Rfl: 2 .  HYDROcodone-acetaminophen (NORCO/VICODIN) 5-325 MG tablet, Take 1 tablet by mouth every 6 (six) hours as needed for moderate pain (To take more frequent after surgery)., Disp: 100 tablet, Rfl: 0 .  ibuprofen (ADVIL) 200 MG tablet, Take 400 mg by mouth every 6 (six) hours as needed for headache or moderate pain., Disp: , Rfl:  .  Ibuprofen-diphenhydrAMINE HCl (ADVIL PM) 200-25 MG CAPS, Take 2 tablets by mouth at bedtime., Disp: , Rfl:  .  losartan (COZAAR) 100 MG tablet, TAKE 1 TABLET BY MOUTH ONCE DAILY (Patient not taking: Reported on 06/14/2019), Disp: 90 tablet, Rfl: 1 .  meclizine (ANTIVERT) 25 MG tablet, Take 1 tablet (25 mg total) by mouth daily as needed for dizziness., Disp: 30 tablet, Rfl: 0 .  Melatonin 10 MG TABS, Take 50 mg by mouth at bedtime., Disp: , Rfl:  .  nystatin cream (MYCOSTATIN), Apply 1 application topically daily as needed (rash). , Disp: , Rfl:  .  pantoprazole (PROTONIX) 40 MG tablet, Take 1 tablet (40 mg total) by mouth daily., Disp: 90 tablet, Rfl: 1 .  triamcinolone cream (KENALOG) 0.1 %, Apply 1 application topically 2 (two) times daily as needed for rash., Disp: , Rfl:  .  venlafaxine XR  (EFFEXOR-XR) 150 MG 24 hr capsule, TAKE 1 CAPSULE BY MOUTH EVERY DAY WITH BREAKFAST (Patient taking differently: Take 150 mg by mouth daily. ), Disp: 30 capsule, Rfl: 2  EXAM:  VITALS per patient if applicable:  GENERAL: alert, oriented, appears well and in no acute distress  HEENT: atraumatic, conjunttiva clear, no obvious abnormalities on inspection of external nose and ears  NECK: normal movements of the head and neck  LUNGS: on inspection no signs of respiratory distress, breathing rate appears normal, no obvious gross SOB, gasping or wheezing  CV: no obvious cyanosis  MS: moves all visible extremities without noticeable abnormality  PSYCH/NEURO: pleasant and cooperative, no obvious depression or anxiety, speech and thought processing grossly intact  ASSESSMENT AND PLAN:  Discussed the following assessment and plan:  No diagnosis found.  No problem-specific Assessment & Plan notes found for this encounter.     I discussed the assessment and treatment plan with the patient. The patient was provided an opportunity to ask questions and all were answered. The patient agreed with the plan and demonstrated an understanding of the instructions.   The patient was advised to call back or seek an in-person evaluation if the symptoms worsen or if the condition fails to improve as anticipated.  No follow-ups on file.    Terik Haughey Martinique, MD

## 2019-07-27 ENCOUNTER — Encounter: Payer: Self-pay | Admitting: Family Medicine

## 2019-07-29 ENCOUNTER — Telehealth (HOSPITAL_COMMUNITY): Payer: Self-pay | Admitting: Emergency Medicine

## 2019-07-29 NOTE — Telephone Encounter (Signed)
Negative DVT study. Attempted to reach patient. No answer at this time.

## 2019-07-31 ENCOUNTER — Encounter: Payer: Self-pay | Admitting: Family Medicine

## 2019-08-02 ENCOUNTER — Other Ambulatory Visit: Payer: Self-pay | Admitting: Family Medicine

## 2019-08-02 ENCOUNTER — Ambulatory Visit (INDEPENDENT_AMBULATORY_CARE_PROVIDER_SITE_OTHER): Payer: Medicare Other | Admitting: Family Medicine

## 2019-08-02 ENCOUNTER — Encounter: Payer: Self-pay | Admitting: Family Medicine

## 2019-08-02 ENCOUNTER — Telehealth: Payer: Self-pay | Admitting: Family Medicine

## 2019-08-02 ENCOUNTER — Other Ambulatory Visit: Payer: Self-pay

## 2019-08-02 VITALS — BP 130/84 | HR 88 | Temp 97.8°F | Resp 12 | Ht 65.0 in | Wt 213.2 lb

## 2019-08-02 DIAGNOSIS — I10 Essential (primary) hypertension: Secondary | ICD-10-CM | POA: Diagnosis not present

## 2019-08-02 DIAGNOSIS — F32A Depression, unspecified: Secondary | ICD-10-CM

## 2019-08-02 DIAGNOSIS — F329 Major depressive disorder, single episode, unspecified: Secondary | ICD-10-CM | POA: Diagnosis not present

## 2019-08-02 DIAGNOSIS — E876 Hypokalemia: Secondary | ICD-10-CM | POA: Diagnosis not present

## 2019-08-02 DIAGNOSIS — F419 Anxiety disorder, unspecified: Secondary | ICD-10-CM

## 2019-08-02 DIAGNOSIS — K5900 Constipation, unspecified: Secondary | ICD-10-CM

## 2019-08-02 DIAGNOSIS — R197 Diarrhea, unspecified: Secondary | ICD-10-CM | POA: Diagnosis not present

## 2019-08-02 DIAGNOSIS — R600 Localized edema: Secondary | ICD-10-CM

## 2019-08-02 LAB — BASIC METABOLIC PANEL
BUN: 23 mg/dL (ref 6–23)
CO2: 30 mEq/L (ref 19–32)
Calcium: 9.2 mg/dL (ref 8.4–10.5)
Chloride: 104 mEq/L (ref 96–112)
Creatinine, Ser: 0.81 mg/dL (ref 0.40–1.20)
GFR: 70.03 mL/min (ref >60.00)
Glucose, Bld: 90 mg/dL (ref 70–99)
Potassium: 4.3 mEq/L (ref 3.5–5.1)
Sodium: 142 mEq/L (ref 135–145)

## 2019-08-02 NOTE — Telephone Encounter (Signed)
Ok to transfer, please make patient aware that continued Rx of controlled substances is not guaranteed until after we connect and discuss. Thanks.

## 2019-08-02 NOTE — Progress Notes (Signed)
HPI:  Chief Complaint  Patient presents with  . Leg Swelling    swelling of right leg and foot, started over a week ago    Tamara [name] is a 68 y.o. female, who is here today complaining of right lower extremity edema, which she thinks is cellulitis.  She underwent panniculectomy and bilateral diet lipectomy on 06/20/2019. Treated with Bactrim early 07/2019. According to patient, she was told by her surgeon to see PCP to treat for cellulitis.  She was evaluated in the ER on 07/23/2019 because of lower extremity edema. Lower extremity venous US was done, negative for DVT. Discharged home with instructions.  She is expressing dissatisfaction with ER visit, "that doctor was clueless", so she did not follow his recommendations but rather continued with instructions given by surgeon.  She has been following with surgeon, Dr. Iran Planas, last visit was yesterday.  On 07/21/2019 and after patient called to report fever.she was seen at Dr. Para Skeans office, right thigh seroma was drained, and she was started on doxycycline for right lower extremity cellulitis. Currently she is on antibiotic treatment, she has 2 days treatment left.  Wounds are healing well. Pain is well controlled with  hydrocodone-acetaminophen 5-325 mg twice daily.  Today she is very upset because she could not communicate with the office to arrange follow-up appointment. We have a visual visit arranged for 07/26/2019, we had some difficulty connecting, so I called her and left a message on her phone. According to patient, she has not had a phone in a days, states that she told scheduler when she scheduled appointment.    Lab Results  Component Value Date   CREATININE 0.77 06/15/2019   BUN 21 06/15/2019   NA 142 06/15/2019   K 3.2 (L) 06/15/2019   CL 107 06/15/2019   CO2 26 06/15/2019   Lab Results  Component Value Date   CREATININE 0.77 06/15/2019   BUN 21 06/15/2019   NA 142 06/15/2019   K 3.2 (L) 06/15/2019   CL 107 06/15/2019   CO2 26 06/15/2019    Lower extremity edema has improved. She denies fever, chills, unusual body aches or fatigue. Last time she had fever was a week ago, reporting max temp of 102.0 F 5 to 6 days ago. She denies chest pain, palpitation, dyspnea, or diaphoresis. She denies orthopnea and PND.  Hypertension, currently she is on losartan 100 mg daily, carvedilol 12.5 mg twice daily, and amlodipine 2.5 mg daily. She denies checking BP at home.  Right lower extremity edema is much better in the morning and gets worse throughout the day. Is alleviated by elevation. She has not been able to wear the compression stockings.  She is also complaining about diarrhea for the past 2 weeks, 2-3 episodes depending of what type of food she eats. She denies abdominal pain. Until last week she was having nausea and episodes of vomiting.   Review of Systems  Constitutional: Negative for appetite change and unexpected weight change.  HENT: Negative for mouth sores, nosebleeds and sore throat.   Gastrointestinal: Negative for abdominal distention.  Genitourinary: Negative for decreased urine volume, dysuria and hematuria.  Musculoskeletal: Positive for arthralgias and back pain.  Skin: Negative for rash and wound.  Neurological: Negative for syncope, facial asymmetry and weakness.  Psychiatric/Behavioral: Negative for confusion. The patient is nervous/anxious.   Rest see pertinent positives and negatives per HPI.   Current Outpatient Medications on File Prior to Visit  Medication  Sig Dispense Refill  . albuterol (VENTOLIN HFA) 108 (90 Base) MCG/ACT inhaler Inhale 2 puffs into the lungs every 6 (six) hours as needed for wheezing or shortness of breath. 36 each 2  . AMITIZA 24 MCG capsule TAKE 1 CAPSULE BY MOUTH TWICE A DAY WITH A MEAL 60 capsule 1  . amLODipine (NORVASC) 2.5 MG tablet Take 1 tablet (2.5 mg total) by mouth daily. 90 tablet 2  . anastrozole  (ARIMIDEX) 1 MG tablet TAKE 1 TABLET BY MOUTH EVERY DAY (Patient taking differently: Take 1 mg by mouth daily. ) 30 tablet 2  . aspirin EC 81 MG tablet Take 81 mg by mouth at bedtime.    Marland Kitchen atorvastatin (LIPITOR) 20 MG tablet TAKE 1 TABLET BY MOUTH ONCE DAILY AT 6 PM. (Patient taking differently: Take 20 mg by mouth daily at 6 PM. ) 90 tablet 1  . buPROPion (WELLBUTRIN SR) 150 MG 12 hr tablet Take 1 tablet (150 mg total) by mouth daily. 90 tablet 1  . carvedilol (COREG) 12.5 MG tablet TAKE 1 TABLET (12.5 MG TOTAL) BY MOUTH 2 (TWO) TIMES DAILY WITH A MEAL. 180 tablet 2  . clindamycin (CLINDAGEL) 1 % gel Apply topically 2 (two) times daily. (Patient taking differently: Apply 1 application topically 2 (two) times daily as needed (rash). ) 30 g 0  . doxycycline (VIBRAMYCIN) 100 MG capsule TAKE 1 CAPSULE BY MOUTH TWICE A DAY FOR 7 DAYS    . flintstones complete (FLINTSTONES) 60 MG chewable tablet Chew 1 tablet by mouth daily.    . furosemide (LASIX) 40 MG tablet Take 0.5 tablets (20 mg total) by mouth daily as needed for edema. 45 tablet 2  . HYDROcodone-acetaminophen (NORCO/VICODIN) 5-325 MG tablet Take 1 tablet by mouth every 6 (six) hours as needed for moderate pain (To take more frequent after surgery). 100 tablet 0  . HYSEPT 0.25 % SOLN APPLY TOPICALLY ONCE FOR 1 DOSE. APPLY TO GAUZE AND PLACE IN WOUNDS ONCE DAILY    . ibuprofen (ADVIL) 200 MG tablet Take 400 mg by mouth every 6 (six) hours as needed for headache or moderate pain.    . Ibuprofen-diphenhydrAMINE HCl (ADVIL PM) 200-25 MG CAPS Take 2 tablets by mouth at bedtime.    Marland Kitchen losartan (COZAAR) 100 MG tablet TAKE 1 TABLET BY MOUTH ONCE DAILY 90 tablet 1  . meclizine (ANTIVERT) 25 MG tablet Take 1 tablet (25 mg total) by mouth daily as needed for dizziness. 30 tablet 0  . Melatonin 10 MG TABS Take 50 mg by mouth at bedtime.    Marland Kitchen NATURAL VITAMIN D-3 125 MCG (5000 UT) TABS Take 1 tablet by mouth daily.    Marland Kitchen nystatin cream (MYCOSTATIN) Apply 1  application topically daily as needed (rash).     . pantoprazole (PROTONIX) 40 MG tablet Take 1 tablet (40 mg total) by mouth daily. 90 tablet 1  . triamcinolone cream (KENALOG) 0.1 % Apply 1 application topically 2 (two) times daily as needed for rash.    . venlafaxine XR (EFFEXOR-XR) 150 MG 24 hr capsule TAKE 1 CAPSULE BY MOUTH EVERY DAY WITH BREAKFAST (Patient taking differently: Take 150 mg by mouth daily. ) 30 capsule 2   No current facility-administered medications on file prior to visit.      Past Medical History:  Diagnosis Date  . Arthritis    knees   . Asthma    rare;only when around alot of dust-Ventolin inhaler as needed  . Breast cancer ([place])  left. States she did not have lymph nodes removed  . Bursitis of right shoulder   . Cellulitis and abscess of leg   . Complication of anesthesia    was told 01/06/14 that airway was small  . Depression    takes Effexor and Wellbutrin daily  . Hearing loss   . History of bronchitis 1966  . Hyperlipidemia   . Hypertension    takes Coreg and Losartan daily  . Joint pain   . Joint swelling   . Obese   . Peripheral edema    takes Furosemide daily as needed and Potassium daily  . Personal history of radiation therapy 2015  . Radiation 03/08/14-04/26/14   50.4 gray to left breast. Lumpectomy cavity boosted to 64.4 gray  . Restless legs syndrome    takes depakote  . Sleep apnea, obstructive    uses CPAP  . Wears glasses    No Known Allergies  Social History   Socioeconomic History  . Marital status: Divorced    Spouse name: Not on file  . Number of children: 3  . Years of education: Not on file  . Highest education level: Not on file  Occupational History  . 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 Financial resource strain: Not on file  . Food insecurity    Worry: Not on file    Inability: Not on file  . Transportation needs    Medical: Not on file    Non-medical:  Not on file  Tobacco Use  . Smoking status: Never Smoker  . Smokeless tobacco: Never Used  Substance and Sexual Activity  . Alcohol use: No  . Drug use: No  . Sexual activity: Not Currently    Birth control/protection: Post-menopausal  Lifestyle  . Physical activity    Days per week: Not on file    Minutes per session: Not on file  . Stress: Not on file  Relationships  . Social Herbalist on phone: Not on file    Gets together: Not on file    Attends religious service: Not on file    Active member of club or organization: Not on file    Attends meetings of clubs or organizations: Not on file    Relationship status: Not on file  Other Topics Concern  . Not on file  Social History Narrative  . Not on file    Vitals:   08/02/19 1009  BP: 130/84  Pulse: 88  Resp: 12  Temp: 97.8 F (36.6 C)  SpO2: 96%   Body mass index is 35.49 kg/m. Wt Readings from Last 3 Encounters:  08/02/19 213 lb 4 oz (96.7 kg)  07/25/19 210 lb 11.2 oz (95.6 kg)  06/20/19 215 lb 6.4 oz (97.7 kg)    Physical Exam  Nursing note and vitals reviewed. Constitutional: She is oriented to person, place, and time. She appears well-developed. No distress.  HENT:  Head: Normocephalic and atraumatic.  Mouth/Throat: Oropharynx is clear and moist and mucous membranes are normal.  Eyes: Pupils are equal, round, and reactive to light. Conjunctivae are normal.  Cardiovascular: Normal rate and regular rhythm.  No murmur heard. Pulses:      Dorsalis pedis pulses are 2+ on the right side and 2+ on the left side.  Normal capillary refill right foot. No calf tenderness,erythema,or tightness.  Respiratory: Effort normal and breath sounds normal. No respiratory distress.  GI: Soft. There is  no abdominal tenderness.  Musculoskeletal:        General: Edema (1+ pitting LE edema, 2+ pitting right pedal edema.) present.  Neurological: She is alert and oriented to person, place, and time. She has normal  strength. No cranial nerve deficit. Gait normal.  Skin: Skin is warm. No rash noted. There is erythema.  Surgical wounds inner aspect of thighs are healing well. There is an indurated area on right side above knee. Mild tenderness, no fluctuant area. Mild local heat.  There is mild erythema on dorsum of right foot. Skin thickness is normal. No local heat or tenderness.  Psychiatric: Her mood appears anxious. Her affect is blunt.  Well groomed, good eye contact.    ASSESSMENT AND PLAN:  Ms. Margorie was seen today for leg swelling.  Diagnoses and all orders for this visit:  Diarrhea, unspecified type We discussed possible etiologies, most likely related to antibiotic use. Continue daily probiotic. If she is still having diarrhea 2 weeks after completing antibiotics or she develops abdominal pain, we need to evaluate for C. difficile infection. We discussed some side effects of antibiotics. Adequate hydration. Instructed about warning signs.  Hypokalemia Potassium rich diet recommended for now. Diarrhea could be contributing to this problem. Further recommendation will be given according to BMP results.  -     Basic metabolic panel  Edema of right lower extremity She is reporting improvement. RLE venous US DVT was not seen. Problem could be related to vein disease, stasis dermatitis.  I do not think more antibiotic is needed at this time. Recommend lower extremity elevation above waist level a few times during the day, appropriate skin care, and compression stocking. I do not think diuretics are needed at this time, it could also aggravate hypokalemia and cause dehydration. Instructed about warning signs.  Essential hypertension BP adequately controlled. No changes in current management. Continue low-salt diet.  Anxiety and depression No changes in current management.   She is requesting changing to a different PCP. She is upset because she was not able to have appt  arrange after ER, I apologized. She sent message through my chart,some addressed and one routed to have appt arrange. I explained that messages through my chart are not ideal when there are serious concerns or an urgent matter.   Return in about 4 weeks (around 08/30/2019) for She wants a new pcp..     G. Martinique, MD  [facility]. [place] office.

## 2019-08-02 NOTE — Patient Instructions (Addendum)
A few things to remember from today's visit:   Hypokalemia - Plan: Basic metabolic panel  Edema of right lower extremity  Essential hypertension  I wish you well. Please arrange appointment with new PCP as you have decided to establish with a new one.  Please be sure medication list is accurate. If a new problem present, please set up appointment sooner than planned today.

## 2019-08-02 NOTE — Telephone Encounter (Signed)
Pt stopped by the check out asking to transfer from one provider to another and her request was to transfer to Dr. Jerilee Hoh.  Pt is aware that we have to do a protocol and send a msg back to both providers.  Is it okay to do the transfer from Dr. Martinique to Dr. Jerilee Hoh?  Pt would like to have a msg sent with the answer to her MyChart.

## 2019-08-02 NOTE — Telephone Encounter (Signed)
Left message on machine for patient to call and schedule a TOC.  It can be virtual or in office. CRM

## 2019-08-02 NOTE — Telephone Encounter (Signed)
It is ok. Thanks, Tamara [name]

## 2019-08-06 ENCOUNTER — Other Ambulatory Visit: Payer: Self-pay | Admitting: Family Medicine

## 2019-08-07 ENCOUNTER — Encounter: Payer: Self-pay | Admitting: Internal Medicine

## 2019-08-25 ENCOUNTER — Other Ambulatory Visit: Payer: Self-pay | Admitting: Family Medicine

## 2019-08-25 DIAGNOSIS — M159 Polyosteoarthritis, unspecified: Secondary | ICD-10-CM

## 2019-08-26 NOTE — Telephone Encounter (Signed)
Pt has not established yet with Dr. Jerilee Hoh.  Could Dr. Martinique extend medication until seen?

## 2019-08-29 MED ORDER — HYDROCODONE-ACETAMINOPHEN 5-325 MG PO TABS: 1.0000 | ORAL_TABLET | Freq: Two times a day (BID) | ORAL | 0 refills | Status: DC | PRN

## 2019-08-31 ENCOUNTER — Ambulatory Visit (INDEPENDENT_AMBULATORY_CARE_PROVIDER_SITE_OTHER): Payer: Medicare Other | Admitting: Internal Medicine

## 2019-08-31 ENCOUNTER — Other Ambulatory Visit: Payer: Self-pay | Admitting: Internal Medicine

## 2019-08-31 ENCOUNTER — Other Ambulatory Visit: Payer: Self-pay

## 2019-08-31 ENCOUNTER — Encounter: Payer: Self-pay | Admitting: Internal Medicine

## 2019-08-31 VITALS — BP 130/80 | HR 63 | Temp 98.0°F | Wt 215.8 lb

## 2019-08-31 DIAGNOSIS — E7800 Pure hypercholesterolemia, unspecified: Secondary | ICD-10-CM | POA: Diagnosis not present

## 2019-08-31 DIAGNOSIS — Z853 Personal history of malignant neoplasm of breast: Secondary | ICD-10-CM

## 2019-08-31 DIAGNOSIS — D0512 Intraductal carcinoma in situ of left breast: Secondary | ICD-10-CM | POA: Diagnosis not present

## 2019-08-31 DIAGNOSIS — E559 Vitamin D deficiency, unspecified: Secondary | ICD-10-CM | POA: Diagnosis not present

## 2019-08-31 DIAGNOSIS — J4520 Mild intermittent asthma, uncomplicated: Secondary | ICD-10-CM | POA: Diagnosis not present

## 2019-08-31 DIAGNOSIS — Z6836 Body mass index (BMI) 36.0-36.9, adult: Secondary | ICD-10-CM

## 2019-08-31 DIAGNOSIS — I10 Essential (primary) hypertension: Secondary | ICD-10-CM | POA: Diagnosis not present

## 2019-08-31 DIAGNOSIS — K219 Gastro-esophageal reflux disease without esophagitis: Secondary | ICD-10-CM | POA: Diagnosis not present

## 2019-08-31 DIAGNOSIS — F32A Depression, unspecified: Secondary | ICD-10-CM

## 2019-08-31 DIAGNOSIS — Z23 Encounter for immunization: Secondary | ICD-10-CM | POA: Diagnosis not present

## 2019-08-31 DIAGNOSIS — F419 Anxiety disorder, unspecified: Secondary | ICD-10-CM | POA: Diagnosis not present

## 2019-08-31 DIAGNOSIS — F329 Major depressive disorder, single episode, unspecified: Secondary | ICD-10-CM

## 2019-08-31 NOTE — Progress Notes (Signed)
Established Patient Office Visit     CC/Reason for Visit: Establish care, follow-up chronic medical conditions  HPI: Tamara [name] is a 68 y.o. female who is coming in today for the above mentioned reasons. Past Medical History is significant for: Morbid obesity status post gastric sleeve surgery with over 100 pound weight loss who has had abdominal and thigh skin removed, thigh skin removal was most recent in July of this year.  Subsequent to that she developed right thigh seroma and cellulitis that required drainage and antibiotics.  She also has a prior history of breast cancer followed by Dr. Jana Hakim, vitamin D deficiency, hyperlipidemia, obstructive sleep apnea on nightly CPAP followed by pulmonology, history of hypertension and GERD.  She has no acute complaints today.   Past Medical/Surgical History: Past Medical History:  Diagnosis Date  . Arthritis    knees   . Asthma    rare;only when around alot of dust-Ventolin inhaler as needed  . Breast cancer ([place])     left. States she did not have lymph nodes removed  . Bursitis of right shoulder   . Cellulitis and abscess of leg   . Complication of anesthesia    was told 01/06/14 that airway was small  . Depression    takes Effexor and Wellbutrin daily  . Hearing loss   . History of bronchitis 1966  . Hyperlipidemia   . Hypertension    takes Coreg and Losartan daily  . Joint pain   . Joint swelling   . Obese   . Peripheral edema    takes Furosemide daily as needed and Potassium daily  . Personal history of radiation therapy 2015  . Radiation 03/08/14-04/26/14   50.4 gray to left breast. Lumpectomy cavity boosted to 64.4 gray  . Restless legs syndrome    takes depakote  . Sleep apnea, obstructive    uses CPAP  . Wears glasses     Past Surgical History:  Procedure Laterality Date  . BREAST LUMPECTOMY Left 2015  . BREAST LUMPECTOMY WITH NEEDLE LOCALIZATION Left 01/24/2014   Procedure: BREAST LUMPECTOMY WITH  NEEDLE LOCALIZATION;  Surgeon: Edward Jolly, MD;  Location: [facility];  Service: General;  Laterality: Left;  . COLONOSCOPY    . COLONOSCOPY N/A 03/20/2017   Procedure: COLONOSCOPY;  Surgeon: Mauri Pole, MD;  Location: [facility];  Service: Endoscopy;  Laterality: N/A;  . DILATATION & CURETTAGE/HYSTEROSCOPY WITH TRUECLEAR N/A 01/06/2014   Procedure: DILATATION & CURETTAGE/HYSTEROSCOPY WITH TRUCLEAR;  Surgeon: Shon Millet II, MD;  Location: [facility];  Service: Gynecology;  Laterality: N/A;  . HYSTEROPLASTY  01/2014  . INNER EAR SURGERY Bilateral    for hearing loss  . JOINT REPLACEMENT Right    Knee  . LAPAROSCOPIC GASTRIC SLEEVE RESECTION N/A 01/28/2016   Procedure: LAPAROSCOPIC GASTRIC SLEEVE RESECTION WITH UPPER ENDO;  Surgeon: Excell Seltzer, MD;  Location: [facility];  Service: General;  Laterality: N/A;  . LIPOSUCTION WITH LIPOFILLING Bilateral 06/20/2019   Procedure: BILATERAL THIGH LIPECTOMY;  Surgeon: Irene Limbo, MD;  Location: [facility];  Service: Plastics;  Laterality: Bilateral;  . PANNICULECTOMY N/A 06/18/2018   Procedure: PANNICULECTOMY;  Surgeon: Irene Limbo, MD;  Location: [facility];  Service: Plastics;  Laterality: N/A;  . RE-EXCISION OF BREAST LUMPECTOMY Left 02/02/2014   Procedure: RE-EXCISION OF LEFT BREAST LUMPECTOMY;  Surgeon: Edward Jolly, MD;  Location: [facility];  Service: General;  Laterality: Left;  . TUBAL LIGATION      Social  History:  reports that she has never smoked. She has never used smokeless tobacco. She reports that she does not drink alcohol or use drugs.  Allergies: No Known Allergies  Family History:  Family History  Problem Relation Age of Onset  . Dementia Mother   . Heart attack Mother   . Esophageal cancer Father        also had stomach cancer  . Heart attack Father   . Asthma Other   . Hypertension Other   . Thyroid disease Other   . Heart attack Other   . Throat cancer Paternal Grandfather       Current Outpatient Medications:  .  albuterol (VENTOLIN HFA) 108 (90 Base) MCG/ACT inhaler, Inhale 2 puffs into the lungs every 6 (six) hours as needed for wheezing or shortness of breath., Disp: 36 each, Rfl: 2 .  AMITIZA 24 MCG capsule, TAKE 1 CAPSULE BY MOUTH TWICE A DAY WITH A MEAL, Disp: 60 capsule, Rfl: 1 .  amLODipine (NORVASC) 2.5 MG tablet, Take 1 tablet (2.5 mg total) by mouth daily., Disp: 90 tablet, Rfl: 2 .  anastrozole (ARIMIDEX) 1 MG tablet, TAKE 1 TABLET BY MOUTH EVERY DAY (Patient taking differently: Take 1 mg by mouth daily. ), Disp: 30 tablet, Rfl: 2 .  aspirin EC 81 MG tablet, Take 81 mg by mouth at bedtime., Disp: , Rfl:  .  atorvastatin (LIPITOR) 20 MG tablet, TAKE 1 TABLET BY MOUTH ONCE DAILY AT 6 PM., Disp: 30 tablet, Rfl: 5 .  buPROPion (WELLBUTRIN SR) 150 MG 12 hr tablet, Take 1 tablet (150 mg total) by mouth daily., Disp: 90 tablet, Rfl: 1 .  carvedilol (COREG) 12.5 MG tablet, TAKE 1 TABLET (12.5 MG TOTAL) BY MOUTH 2 (TWO) TIMES DAILY WITH A MEAL., Disp: 180 tablet, Rfl: 2 .  clindamycin (CLINDAGEL) 1 % gel, Apply topically 2 (two) times daily. (Patient taking differently: Apply 1 application topically 2 (two) times daily as needed (rash). ), Disp: 30 g, Rfl: 0 .  doxycycline (VIBRAMYCIN) 100 MG capsule, TAKE 1 CAPSULE BY MOUTH TWICE A DAY FOR 7 DAYS, Disp: , Rfl:  .  flintstones complete (FLINTSTONES) 60 MG chewable tablet, Chew 1 tablet by mouth daily., Disp: , Rfl:  .  furosemide (LASIX) 40 MG tablet, Take 0.5 tablets (20 mg total) by mouth daily as needed for edema., Disp: 45 tablet, Rfl: 2 .  HYDROcodone-acetaminophen (NORCO/VICODIN) 5-325 MG tablet, Take 1 tablet by mouth every 12 (twelve) hours as needed for moderate pain (To take more frequent after surgery)., Disp: 20 tablet, Rfl: 0 .  HYSEPT 0.25 % SOLN, APPLY TOPICALLY ONCE FOR 1 DOSE. APPLY TO GAUZE AND PLACE IN WOUNDS ONCE DAILY, Disp: , Rfl:  .  ibuprofen (ADVIL) 200 MG tablet, Take 400 mg by mouth every 6  (six) hours as needed for headache or moderate pain., Disp: , Rfl:  .  Ibuprofen-diphenhydrAMINE HCl (ADVIL PM) 200-25 MG CAPS, Take 2 tablets by mouth at bedtime., Disp: , Rfl:  .  losartan (COZAAR) 100 MG tablet, TAKE 1 TABLET BY MOUTH ONCE DAILY, Disp: 90 tablet, Rfl: 1 .  meclizine (ANTIVERT) 25 MG tablet, TAKE 1 TABLET (25 MG TOTAL) BY MOUTH DAILY AS NEEDED FOR DIZZINESS., Disp: 30 tablet, Rfl: 0 .  Melatonin 10 MG TABS, Take 50 mg by mouth at bedtime., Disp: , Rfl:  .  NATURAL VITAMIN D-3 125 MCG (5000 UT) TABS, Take 1 tablet by mouth daily., Disp: , Rfl:  .  nystatin cream (MYCOSTATIN), Apply  1 application topically daily as needed (rash). , Disp: , Rfl:  .  pantoprazole (PROTONIX) 40 MG tablet, Take 1 tablet (40 mg total) by mouth daily., Disp: 90 tablet, Rfl: 1 .  triamcinolone cream (KENALOG) 0.1 %, Apply 1 application topically 2 (two) times daily as needed for rash., Disp: , Rfl:  .  venlafaxine XR (EFFEXOR-XR) 150 MG 24 hr capsule, TAKE 1 CAPSULE BY MOUTH EVERY DAY WITH BREAKFAST (Patient taking differently: Take 150 mg by mouth daily. ), Disp: 30 capsule, Rfl: 2  Review of Systems:  Constitutional: Denies fever, chills, diaphoresis, appetite change and fatigue.  HEENT: Denies photophobia, eye pain, redness, hearing loss, ear pain, congestion, sore throat, rhinorrhea, sneezing, mouth sores, trouble swallowing, neck pain, neck stiffness and tinnitus.   Respiratory: Denies SOB, DOE, cough, chest tightness,  and wheezing.   Cardiovascular: Denies chest pain, palpitations and leg swelling.  Gastrointestinal: Denies nausea, vomiting, abdominal pain, diarrhea, constipation, blood in stool and abdominal distention.  Genitourinary: Denies dysuria, urgency, frequency, hematuria, flank pain and difficulty urinating.  Endocrine: Denies: hot or cold intolerance, sweats, changes in hair or nails, polyuria, polydipsia. Musculoskeletal: Denies myalgias, back pain, joint swelling, arthralgias and  gait problem.  Skin: Denies pallor, rash and wound.  Neurological: Denies dizziness, seizures, syncope, weakness, light-headedness, numbness and headaches.  Hematological: Denies adenopathy. Easy bruising, personal or family bleeding history  Psychiatric/Behavioral: Denies suicidal ideation, mood changes, confusion, nervousness, sleep disturbance and agitation    Physical Exam: Vitals:   08/31/19 0857  BP: 130/80  Pulse: 63  Temp: 98 F (36.7 C)  TempSrc: Temporal  SpO2: 98%  Weight: 215 lb 12.8 oz (97.9 kg)    Body mass index is 35.91 kg/m.   Constitutional: NAD, calm, comfortable Eyes: PERRL, lids and conjunctivae normal, wears corrective lenses ENMT: Mucous membranes are moist.  Respiratory: clear to auscultation bilaterally, no wheezing, no crackles. Normal respiratory effort. No accessory muscle use.  Cardiovascular: Regular rate and rhythm, no murmurs / rubs / gallops. No extremity edema. 2+ pedal pulses. No carotid bruits.  Abdomen: no tenderness, no masses palpated. No hepatosplenomegaly. Bowel sounds positive.  Musculoskeletal: no clubbing / cyanosis. No joint deformity upper and lower extremities. Good ROM, no contractures. Normal muscle tone.  Skin: no rashes, lesions, ulcers. No induration Neurologic: CN 2-12 grossly intact. Sensation intact, DTR normal. Strength 5/5 in all 4.  Psychiatric: Normal judgment and insight. Alert and oriented x 3. Normal mood.    Impression and Plan:  Essential hypertension -Well-controlled on current regimen  Asthma, intermittent, uncomplicated -Mild, intermittent on as needed albuterol.  Gastroesophageal reflux disease, esophagitis presence not specified -Well-controlled.  Ductal carcinoma in situ (DCIS) of left breast -Follows with Dr. Jana Hakim, in remission.  Vitamin D deficiency -Check levels next physical, she is doing over-the-counter calcium and vitamin D supplementation  Anxiety and depression -Mood is stable.   Pure hypercholesterolemia -Last LDL was 175 in January 2019, recheck with next physical, would probably benefit from increase statin dose.  Class 2 severe obesity with serious comorbidity and body mass index (BMI) of 36.0 to 36.9 in adult, unspecified obesity type ([place]) -Discussed healthy lifestyle, including increased physical activity and better food choices to promote weight loss.    Patient Instructions  -Nice seeing you today!!  -Schedule follow up in 3-4 months.     Lelon Frohlich, MD [facility]

## 2019-08-31 NOTE — Patient Instructions (Signed)
-  Nice seeing you today!!  -Schedule follow up in 3-4 months. 

## 2019-09-03 ENCOUNTER — Other Ambulatory Visit: Payer: Self-pay | Admitting: Internal Medicine

## 2019-09-03 ENCOUNTER — Other Ambulatory Visit: Payer: Self-pay | Admitting: Oncology

## 2019-09-06 ENCOUNTER — Telehealth: Payer: Self-pay | Admitting: *Deleted

## 2019-09-06 MED ORDER — MECLIZINE HCL 25 MG PO TABS: 25.0000 mg | ORAL_TABLET | Freq: Every day | ORAL | 0 refills | Status: DC | PRN

## 2019-09-06 NOTE — Telephone Encounter (Signed)
Okay to refill? 

## 2019-09-06 NOTE — Telephone Encounter (Signed)
Rx done. 

## 2019-09-06 NOTE — Telephone Encounter (Signed)
Refill once 

## 2019-09-06 NOTE — Telephone Encounter (Signed)
[facility] faxed a refill request for Meclizine 25mg -take 1 tablet by mouth daily as needed for dizziness-#30.  Message sent to Dr Elease Hashimoto as PCP is out of the office.

## 2019-09-18 ENCOUNTER — Other Ambulatory Visit: Payer: Self-pay | Admitting: Family Medicine

## 2019-09-18 DIAGNOSIS — M159 Polyosteoarthritis, unspecified: Secondary | ICD-10-CM

## 2019-09-18 DIAGNOSIS — E559 Vitamin D deficiency, unspecified: Secondary | ICD-10-CM

## 2019-09-20 ENCOUNTER — Encounter: Payer: Self-pay | Admitting: Internal Medicine

## 2019-09-20 MED ORDER — HYDROCODONE-ACETAMINOPHEN 5-325 MG PO TABS: 1.0000 | ORAL_TABLET | Freq: Two times a day (BID) | ORAL | 0 refills | Status: DC | PRN

## 2019-09-20 NOTE — Telephone Encounter (Signed)
Patient is on a pain contract with Dr Martinique.  Last office visit Transfer of Care to Dr Jerilee Hoh: 08/31/2019  Last refill: #20 08/29/2019  Okay to refill?

## 2019-09-21 NOTE — Telephone Encounter (Signed)
Patient of Dr. Jerilee Hoh and this medication was filled 2 weeks ago

## 2019-09-22 ENCOUNTER — Ambulatory Visit (INDEPENDENT_AMBULATORY_CARE_PROVIDER_SITE_OTHER): Payer: Medicare Other | Admitting: Pulmonary Disease

## 2019-09-22 ENCOUNTER — Other Ambulatory Visit: Payer: Self-pay

## 2019-09-22 ENCOUNTER — Encounter: Payer: Self-pay | Admitting: Pulmonary Disease

## 2019-09-22 DIAGNOSIS — G4733 Obstructive sleep apnea (adult) (pediatric): Secondary | ICD-10-CM | POA: Diagnosis not present

## 2019-09-22 DIAGNOSIS — F5104 Psychophysiologic insomnia: Secondary | ICD-10-CM

## 2019-09-22 MED ORDER — ROPINIROLE HCL 0.5 MG PO TABS: 0.5000 mg | ORAL_TABLET | Freq: Three times a day (TID) | ORAL | 0 refills | Status: DC

## 2019-09-22 NOTE — Patient Instructions (Addendum)
CPAP is working well at current settings of 9 cm.    CPAP supplies will be renewed for a year.  Decrease melatonin to 10 mg 1 tablet at bedtime. Prescription for  requip 0.5 mg .qhs  Daily 1-2 tabs x 2 weeks Hopefully you should not need Advil PM at this

## 2019-09-22 NOTE — Assessment & Plan Note (Signed)
CPAP is working well at current settings of 9 cm.    CPAP supplies will be renewed for a year. She has lost weight but continues to have mainly positional mild OSA  Weight loss encouraged, compliance with goal of at least 4-6 hrs every night is the expectation. Advised against medications with sedative side effects Cautioned against driving when sleepy - understanding that sleepiness will vary on a day to day basis

## 2019-09-22 NOTE — Progress Notes (Signed)
   Subjective:    Patient ID: Tamara [name], female    DOB: November 19, 1951, 68 y.o.   MRN: [id number]  HPI  68 yo for follow-up of OSA and sleep maintenance insomnia She underwent gastric sleeve surgery in 2016 and lost about 70 pounds.  She has now plateaued at around 220 pounds  Annual follow-up She has maintained her weight around 2 1 9  pounds.  Underwent plastic surgery for removal of excess skin around her abdomen and thighs We reviewed her sleep study from last year which still shows mild OSA mainly positional.  She is very compliant with CPAP. Download was reviewed which shows excellent control of events on 9 cm with large leak and good compliance.  She has severe sleep maintenance insomnia, denies obvious symptoms of restless legs but her prior sleep studies have shown some limb movements during sleep.  She is taking 5 tablets of 10 mg of melatonin every night with Advil PM and even then her bedtime is quite delayed  Significant tests/ events reviewed  PSG 03/2013 - 291 lbs-  severe OSA with AHI 66/hour which was corrected with CPAP of 9 cm.  She also had moderate PLM's 40/hour, however PLM arousal index was low at 0.9/hour.  05/2018 HST mild AHI 13/h , predom supine  Past Medical History:  Diagnosis Date  . Arthritis    knees   . Asthma    rare;only when around alot of dust-Ventolin inhaler as needed  . Breast cancer ([place])     left. States she did not have lymph nodes removed  . Bursitis of right shoulder   . Cellulitis and abscess of leg   . Complication of anesthesia    was told 01/06/14 that airway was small  . Depression    takes Effexor and Wellbutrin daily  . Hearing loss   . History of bronchitis 1966  . Hyperlipidemia   . Hypertension    takes Coreg and Losartan daily  . Joint pain   . Joint swelling   . Obese   . Peripheral edema    takes Furosemide daily as needed and Potassium daily  . Personal history of radiation therapy 2015  . Radiation  03/08/14-04/26/14   50.4 gray to left breast. Lumpectomy cavity boosted to 64.4 gray  . Restless legs syndrome    takes depakote  . Sleep apnea, obstructive    uses CPAP  . Wears glasses      Review of Systems Patient denies significant dyspnea,cough, hemoptysis,  chest pain, palpitations, pedal edema, orthopnea, paroxysmal nocturnal dyspnea, lightheadedness, nausea, vomiting, abdominal or  leg pains      Objective:   Physical Exam  Gen. Pleasant, obese, in no distress, normal affect ENT - no pallor,icterus, no post nasal drip, class 2-3 airway Neck: No JVD, no thyromegaly, no carotid bruits Lungs: no use of accessory muscles, no dullness to percussion, decreased without rales or rhonchi  Cardiovascular: Rhythm regular, heart sounds  normal, no murmurs or gallops, no peripheral edema Abdomen: soft and non-tender, no hepatosplenomegaly, BS normal. Musculoskeletal: No deformities, no cyanosis or clubbing Neuro:  alert, non focal, no tremors       Assessment & Plan:

## 2019-09-22 NOTE — Assessment & Plan Note (Addendum)
Decrease melatonin to 10 mg 1 tablet at bedtime. Prescription for  requip 0.5 mg .qhs  Daily 1-2 tabs x 2 weeks -we will treat restless legs initially Hopefully you should not need Advil PM at this  If no improvement with Requip, then will try trazodone

## 2019-09-22 NOTE — Addendum Note (Signed)
Addended by: Jannette Spanner on: 09/22/2019 09:33 AM   Modules accepted: Orders

## 2019-09-26 ENCOUNTER — Other Ambulatory Visit: Payer: Self-pay | Admitting: Oncology

## 2019-09-26 ENCOUNTER — Other Ambulatory Visit: Payer: Self-pay

## 2019-09-26 ENCOUNTER — Ambulatory Visit
Admission: RE | Admit: 2019-09-26 | Discharge: 2019-09-26 | Disposition: A | Discharge status: Home / Self Care | Payer: Medicare Other | Source: Ambulatory Visit | Attending: Internal Medicine | Admitting: Internal Medicine

## 2019-09-26 ENCOUNTER — Other Ambulatory Visit: Payer: Self-pay | Admitting: Internal Medicine

## 2019-09-26 DIAGNOSIS — Z853 Personal history of malignant neoplasm of breast: Secondary | ICD-10-CM

## 2019-09-26 DIAGNOSIS — R928 Other abnormal and inconclusive findings on diagnostic imaging of breast: Secondary | ICD-10-CM | POA: Diagnosis not present

## 2019-09-30 ENCOUNTER — Other Ambulatory Visit: Payer: Self-pay | Admitting: Internal Medicine

## 2019-09-30 DIAGNOSIS — M159 Polyosteoarthritis, unspecified: Secondary | ICD-10-CM

## 2019-10-03 MED ORDER — TRAZODONE HCL 50 MG PO TABS: 50.0000 mg | ORAL_TABLET | Freq: Every day | ORAL | 0 refills | Status: DC

## 2019-10-03 NOTE — Telephone Encounter (Signed)
Okay to stop taking Requip. If she is willing, we can trial trazodone 50 mg nightly for insomnia # 14 tabs She can call back for longer prescription of this works-if this does not work for 3 nights, can increase  To 100 mg which would be 2 tablets

## 2019-10-04 ENCOUNTER — Encounter: Payer: Self-pay | Admitting: Internal Medicine

## 2019-10-07 ENCOUNTER — Other Ambulatory Visit: Payer: Self-pay | Admitting: Internal Medicine

## 2019-10-07 ENCOUNTER — Other Ambulatory Visit: Payer: Self-pay

## 2019-10-07 ENCOUNTER — Telehealth (INDEPENDENT_AMBULATORY_CARE_PROVIDER_SITE_OTHER): Payer: Medicare Other | Admitting: Internal Medicine

## 2019-10-07 ENCOUNTER — Other Ambulatory Visit: Payer: Self-pay | Admitting: Family Medicine

## 2019-10-07 DIAGNOSIS — M159 Polyosteoarthritis, unspecified: Secondary | ICD-10-CM

## 2019-10-07 DIAGNOSIS — K5900 Constipation, unspecified: Secondary | ICD-10-CM

## 2019-10-07 MED ORDER — HYDROCODONE-ACETAMINOPHEN 5-325 MG PO TABS: 1.0000 | ORAL_TABLET | Freq: Four times a day (QID) | ORAL | 0 refills | Status: DC | PRN

## 2019-10-07 NOTE — Progress Notes (Signed)
Virtual Visit via Telephone Note  I connected with Tamara [name] on 10/07/19 at  1:00 PM EST by telephone and verified that I am speaking with the correct person using two identifiers.   I discussed the limitations, risks, security and privacy concerns of performing an evaluation and management service by telephone and the availability of in person appointments. I also discussed with the patient that there may be a patient responsible charge related to this service. The patient expressed understanding and agreed to proceed.  Location patient: home Location provider: work office Participants present for the call: patient, provider Patient did not have a visit in the prior 7 days to address this/these issue(s).   History of Present Illness:  She has scheduled this visit to discuss medication refills. Her prior PCP had been prescribing vicodin long-term for her multiple site OA. Dose was 5/325 mg BID PRN. She is now out of medication and would like to continue this prescription. No increase in pain, but "pain is always there".   Observations/Objective: Patient sounds cheerful and well on the phone. I do not appreciate any increased work of breathing. Speech and thought processing are grossly intact. Patient reported vitals: none reported   Current Outpatient Medications:  .  albuterol (VENTOLIN HFA) 108 (90 Base) MCG/ACT inhaler, Inhale 2 puffs into the lungs every 6 (six) hours as needed for wheezing or shortness of breath., Disp: 36 each, Rfl: 2 .  AMITIZA 24 MCG capsule, TAKE 1 CAPSULE BY MOUTH TWICE A DAY WITH A MEAL, Disp: 60 capsule, Rfl: 1 .  amLODipine (NORVASC) 2.5 MG tablet, Take 1 tablet (2.5 mg total) by mouth daily., Disp: 90 tablet, Rfl: 2 .  anastrozole (ARIMIDEX) 1 MG tablet, TAKE 1 TABLET BY MOUTH EVERY DAY (Patient taking differently: Take 1 mg by mouth daily. ), Disp: 30 tablet, Rfl: 2 .  aspirin EC 81 MG tablet, Take 81 mg by mouth at bedtime., Disp: , Rfl:  .   atorvastatin (LIPITOR) 20 MG tablet, TAKE 1 TABLET BY MOUTH ONCE DAILY AT 6 PM., Disp: 30 tablet, Rfl: 5 .  buPROPion (WELLBUTRIN SR) 150 MG 12 hr tablet, Take 1 tablet (150 mg total) by mouth daily., Disp: 90 tablet, Rfl: 1 .  carvedilol (COREG) 12.5 MG tablet, TAKE 1 TABLET (12.5 MG TOTAL) BY MOUTH 2 (TWO) TIMES DAILY WITH A MEAL., Disp: 180 tablet, Rfl: 2 .  clindamycin (CLINDAGEL) 1 % gel, Apply topically 2 (two) times daily. (Patient taking differently: Apply 1 application topically 2 (two) times daily as needed (rash). ), Disp: 30 g, Rfl: 0 .  doxycycline (VIBRAMYCIN) 100 MG capsule, TAKE 1 CAPSULE BY MOUTH TWICE A DAY FOR 7 DAYS, Disp: , Rfl:  .  flintstones complete (FLINTSTONES) 60 MG chewable tablet, Chew 1 tablet by mouth daily., Disp: , Rfl:  .  furosemide (LASIX) 40 MG tablet, Take 0.5 tablets (20 mg total) by mouth daily as needed for edema., Disp: 45 tablet, Rfl: 2 .  HYDROcodone-acetaminophen (NORCO) 5-325 MG tablet, Take 1 tablet by mouth every 6 (six) hours as needed for moderate pain., Disp: 60 tablet, Rfl: 0 .  HYDROcodone-acetaminophen (NORCO) 5-325 MG tablet, Take 1 tablet by mouth every 6 (six) hours as needed for moderate pain., Disp: 60 tablet, Rfl: 0 .  HYDROcodone-acetaminophen (NORCO) 5-325 MG tablet, Take 1 tablet by mouth every 6 (six) hours as needed for moderate pain., Disp: 60 tablet, Rfl: 0 .  HYDROcodone-acetaminophen (NORCO/VICODIN) 5-325 MG tablet, Take 1 tablet by mouth  every 12 (twelve) hours as needed for moderate pain (To take more frequent after surgery)., Disp: 20 tablet, Rfl: 0 .  HYSEPT 0.25 % SOLN, APPLY TOPICALLY ONCE FOR 1 DOSE. APPLY TO GAUZE AND PLACE IN WOUNDS ONCE DAILY, Disp: , Rfl:  .  ibuprofen (ADVIL) 200 MG tablet, Take 400 mg by mouth every 6 (six) hours as needed for headache or moderate pain., Disp: , Rfl:  .  Ibuprofen-diphenhydrAMINE HCl (ADVIL PM) 200-25 MG CAPS, Take 2 tablets by mouth at bedtime., Disp: , Rfl:  .  losartan (COZAAR) 100  MG tablet, TAKE 1 TABLET BY MOUTH ONCE DAILY, Disp: 90 tablet, Rfl: 1 .  meclizine (ANTIVERT) 25 MG tablet, TAKE 1 TABLET BY MOUTH DAILY AS NEEDED FOR DIZZINESS, Disp: 30 tablet, Rfl: 0 .  Melatonin 10 MG TABS, Take 50 mg by mouth at bedtime., Disp: , Rfl:  .  NATURAL VITAMIN D-3 125 MCG (5000 UT) TABS, Take 1 tablet by mouth daily., Disp: , Rfl:  .  nystatin cream (MYCOSTATIN), Apply 1 application topically daily as needed (rash). , Disp: , Rfl:  .  pantoprazole (PROTONIX) 40 MG tablet, Take 1 tablet (40 mg total) by mouth daily., Disp: 90 tablet, Rfl: 1 .  rOPINIRole (REQUIP) 0.5 MG tablet, Take 1 tablet (0.5 mg total) by mouth 3 (three) times daily., Disp: 30 tablet, Rfl: 0 .  traZODone (DESYREL) 50 MG tablet, Take 1 tablet (50 mg total) by mouth at bedtime., Disp: 14 tablet, Rfl: 0 .  triamcinolone cream (KENALOG) 0.1 %, Apply 1 application topically 2 (two) times daily as needed for rash., Disp: , Rfl:  .  venlafaxine XR (EFFEXOR-XR) 150 MG 24 hr capsule, TAKE 1 CAPSULE BY MOUTH EVERY DAY WITH BREAKFAST, Disp: 30 capsule, Rfl: 2  Review of Systems:  Constitutional: Denies fever, chills, diaphoresis, appetite change and fatigue.  HEENT: Denies photophobia, eye pain, redness, hearing loss, ear pain, congestion, sore throat, rhinorrhea, sneezing, mouth sores, trouble swallowing, neck pain, neck stiffness and tinnitus.   Respiratory: Denies SOB, DOE, cough, chest tightness,  and wheezing.   Cardiovascular: Denies chest pain, palpitations and leg swelling.  Gastrointestinal: Denies nausea, vomiting, abdominal pain, diarrhea, constipation, blood in stool and abdominal distention.  Genitourinary: Denies dysuria, urgency, frequency, hematuria, flank pain and difficulty urinating.  Endocrine: Denies: hot or cold intolerance, sweats, changes in hair or nails, polyuria, polydipsia. Musculoskeletal: Denies myalgias, back pain, joint swelling, arthralgias and gait problem.  Skin: Denies pallor, rash and  wound.  Neurological: Denies dizziness, seizures, syncope, weakness, light-headedness, numbness and headaches.  Hematological: Denies adenopathy. Easy bruising, personal or family bleeding history  Psychiatric/Behavioral: Denies suicidal ideation, mood changes, confusion, nervousness, sleep disturbance and agitation   Assessment and Plan:  Generalized osteoarthritis of multiple sites  -PDMP reviewed in Epic. -No red flags. All prior Rx's were from her prior PCP. -ORS 130. -Refill #60 tabs a month for 3 months. -At next in-person visit will need to sign pain contract, but she has verbally agreed to all the terms in it today.    I discussed the assessment and treatment plan with the patient. The patient was provided an opportunity to ask questions and all were answered. The patient agreed with the plan and demonstrated an understanding of the instructions.   The patient was advised to call back or seek an in-person evaluation if the symptoms worsen or if the condition fails to improve as anticipated.  I provided 12 minutes of non-face-to-face time during this encounter.     Isaac Bliss, MD [facility] at [facility]

## 2019-10-10 NOTE — Telephone Encounter (Signed)
Patient of Dr. Hernandez  

## 2019-10-12 ENCOUNTER — Other Ambulatory Visit: Payer: Self-pay

## 2019-10-13 MED ORDER — TRAZODONE HCL 50 MG PO TABS: 50.0000 mg | ORAL_TABLET | Freq: Every day | ORAL | 1 refills | Status: DC

## 2019-10-13 NOTE — Telephone Encounter (Signed)
Okay to refill trazodone 50 mg at bedtime daily #90 with 1 refill

## 2019-10-13 NOTE — Telephone Encounter (Signed)
Please advise on refill. Thanks. 

## 2019-10-14 ENCOUNTER — Encounter: Payer: Self-pay | Admitting: Internal Medicine

## 2019-10-24 DIAGNOSIS — H903 Sensorineural hearing loss, bilateral: Secondary | ICD-10-CM | POA: Diagnosis not present

## 2019-10-24 DIAGNOSIS — H906 Mixed conductive and sensorineural hearing loss, bilateral: Secondary | ICD-10-CM | POA: Diagnosis not present

## 2019-10-24 DIAGNOSIS — H900 Conductive hearing loss, bilateral: Secondary | ICD-10-CM | POA: Diagnosis not present

## 2019-10-26 ENCOUNTER — Ambulatory Visit (INDEPENDENT_AMBULATORY_CARE_PROVIDER_SITE_OTHER): Payer: Medicare Other | Admitting: *Deleted

## 2019-10-26 ENCOUNTER — Other Ambulatory Visit: Payer: Self-pay

## 2019-10-26 DIAGNOSIS — Z23 Encounter for immunization: Secondary | ICD-10-CM | POA: Diagnosis not present

## 2019-10-26 NOTE — Progress Notes (Signed)
Patient in for Pneumovax-23 vaccine. Vaccine administered with no reaction.

## 2019-11-05 ENCOUNTER — Other Ambulatory Visit: Payer: Self-pay | Admitting: Internal Medicine

## 2019-11-05 DIAGNOSIS — M159 Polyosteoarthritis, unspecified: Secondary | ICD-10-CM

## 2019-11-06 ENCOUNTER — Other Ambulatory Visit: Payer: Self-pay | Admitting: Family Medicine

## 2019-11-06 ENCOUNTER — Other Ambulatory Visit: Payer: Self-pay | Admitting: Internal Medicine

## 2019-11-06 DIAGNOSIS — K219 Gastro-esophageal reflux disease without esophagitis: Secondary | ICD-10-CM

## 2019-11-06 DIAGNOSIS — I10 Essential (primary) hypertension: Secondary | ICD-10-CM

## 2019-11-08 ENCOUNTER — Encounter: Payer: Self-pay | Admitting: Internal Medicine

## 2019-11-08 NOTE — Telephone Encounter (Signed)
Last filled 10/07/2019

## 2019-11-08 NOTE — Telephone Encounter (Signed)
Last filled 10/11/2019 Please advise. Should patient continue this medication?

## 2019-11-10 IMAGING — MG DIGITAL DIAGNOSTIC BILATERAL MAMMOGRAM WITH TOMO AND CAD
6 of 11 series · 6 of 31 positions shown · non-contrast
Comparison: Previous exam(s).

CLINICAL DATA: History of treated left breast cancer, status post
lumpectomy and radiation therapy in 5644.

EXAM:
DIGITAL DIAGNOSTIC BILATERAL MAMMOGRAM WITH CAD AND TOMO

[L CC]
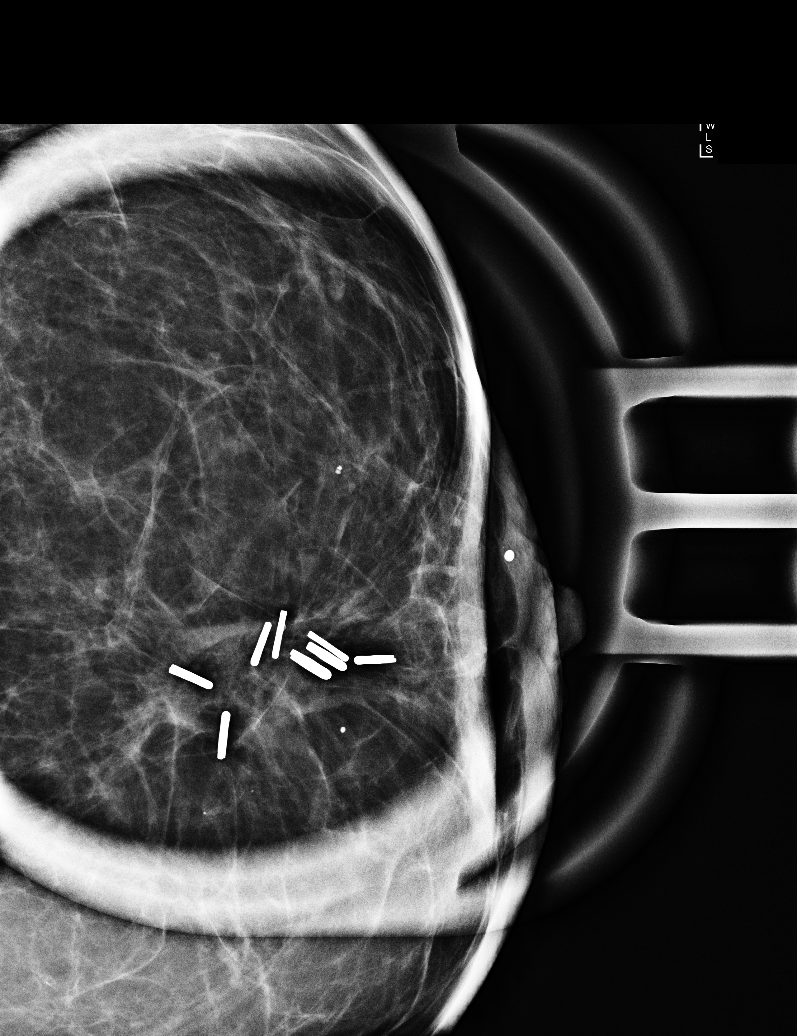

[R MLO synth-2D (1 of 2)]
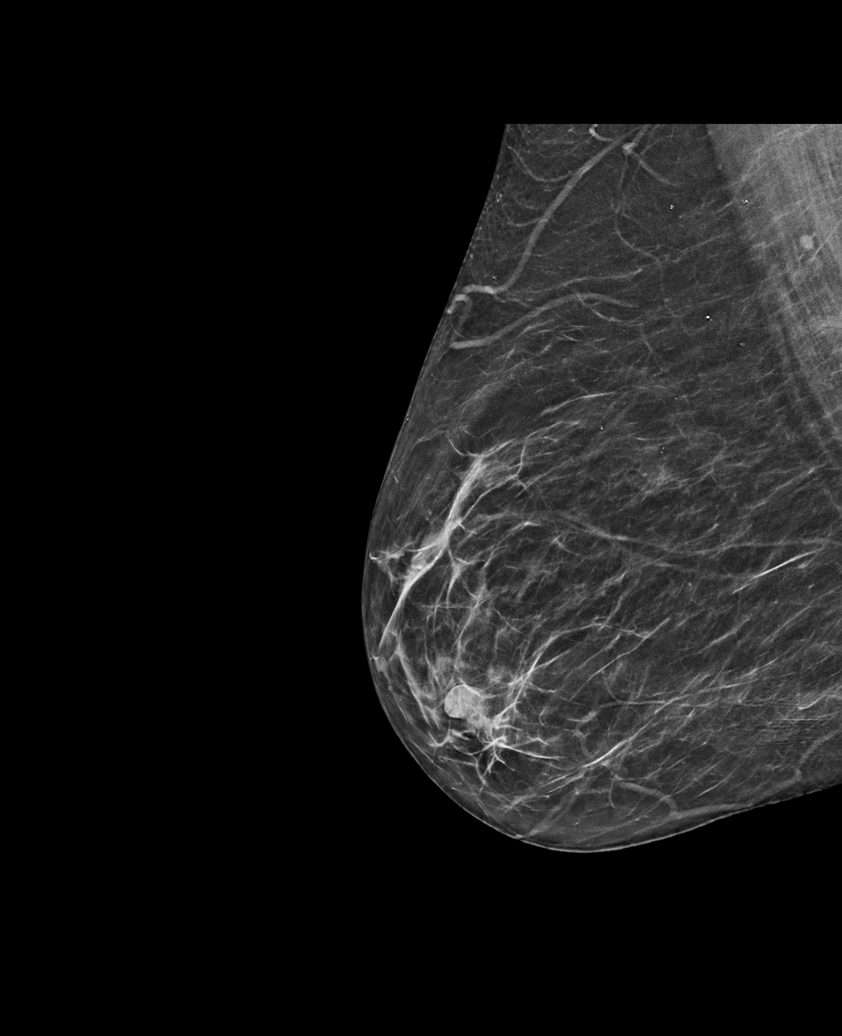

[R MLO synth-2D (2 of 2)]
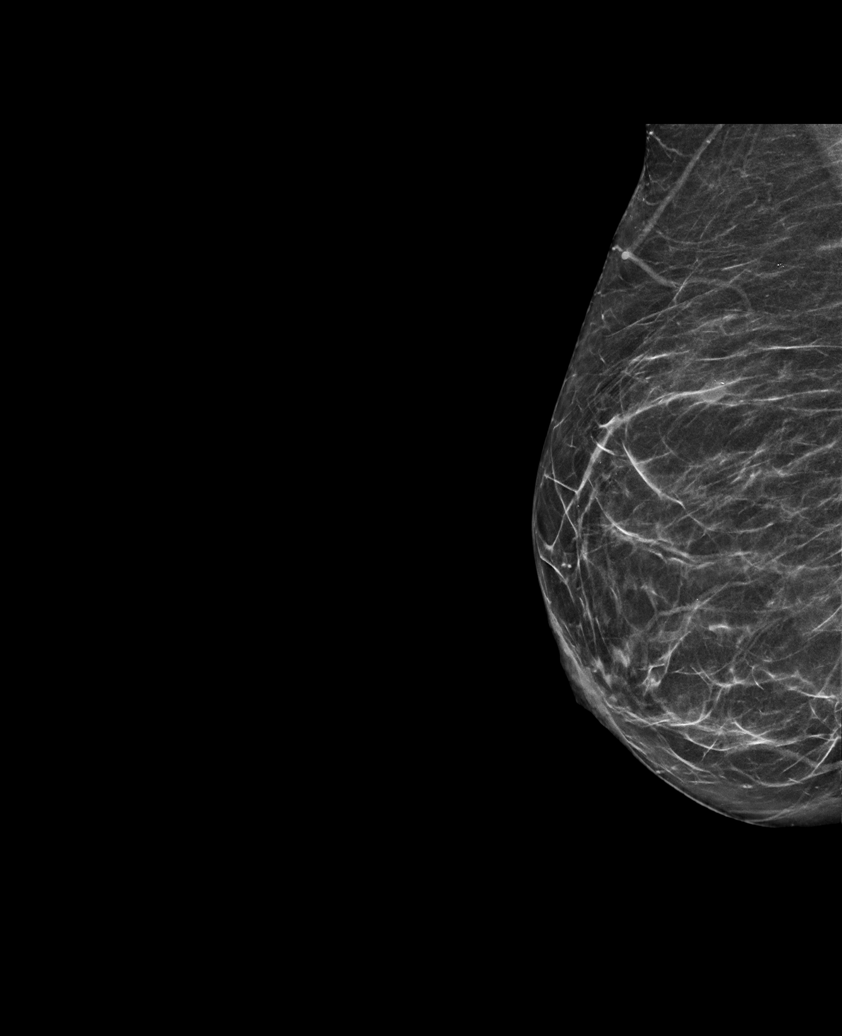

[L CC synth-2D]
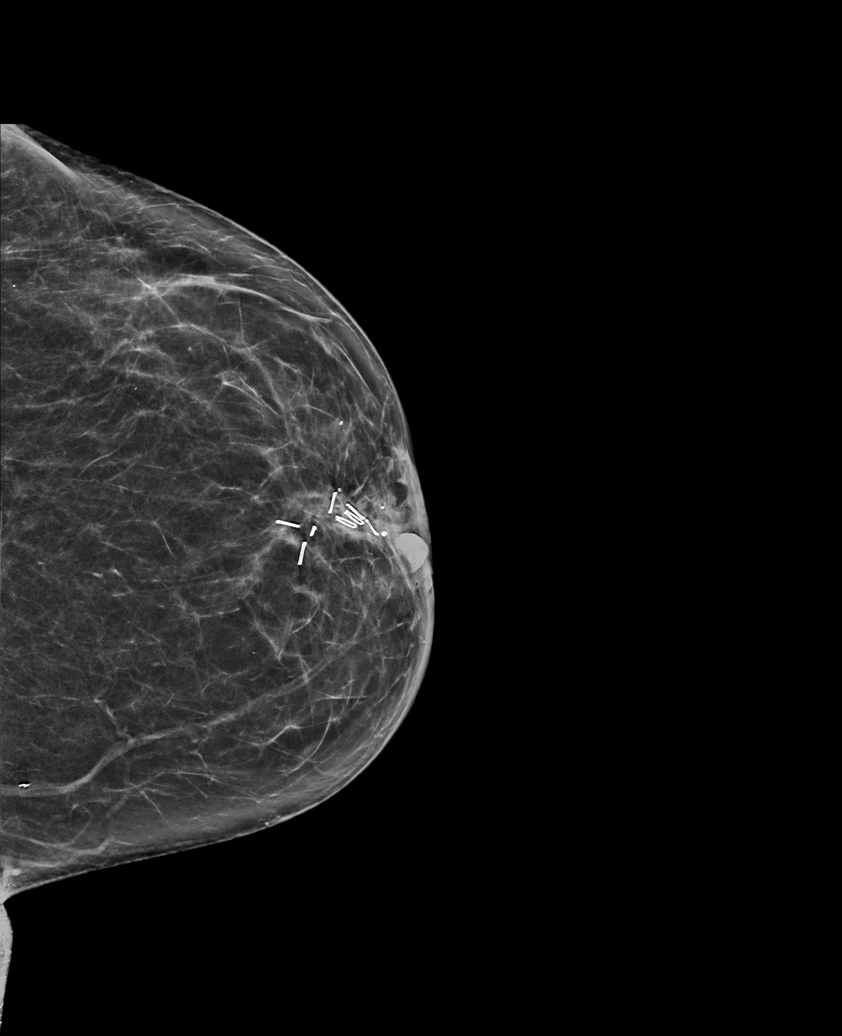

[L MLO synth-2D]
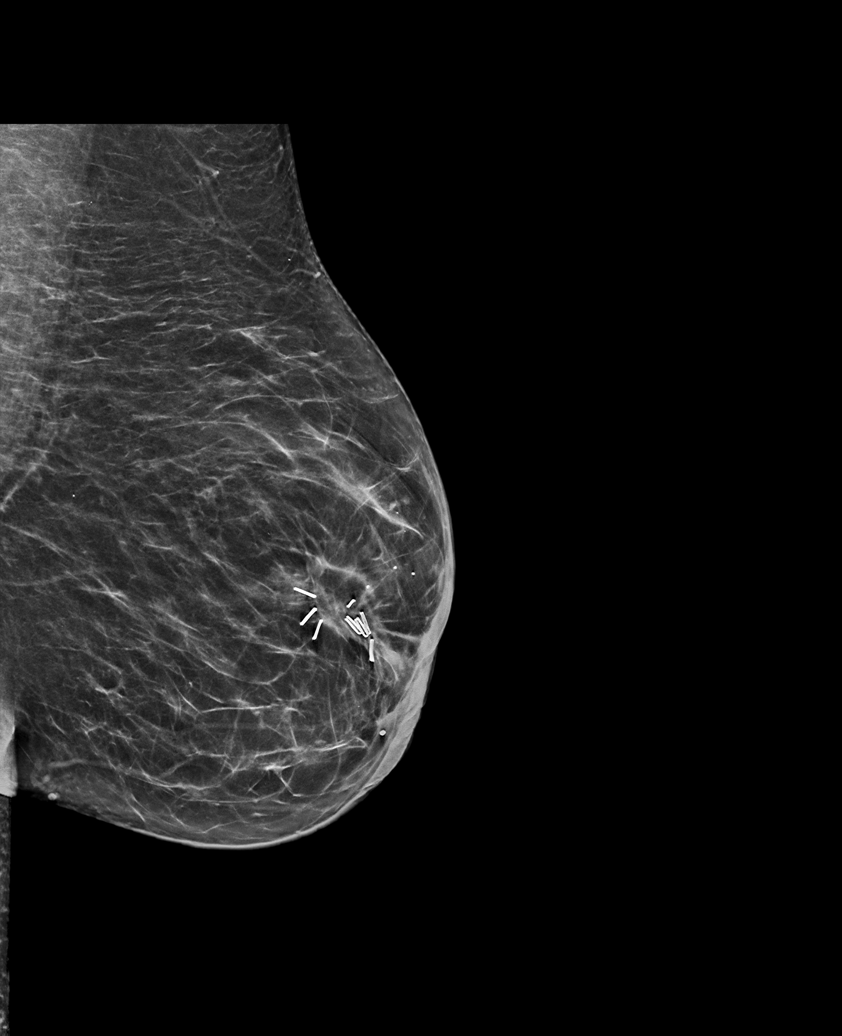

[R CC synth-2D]
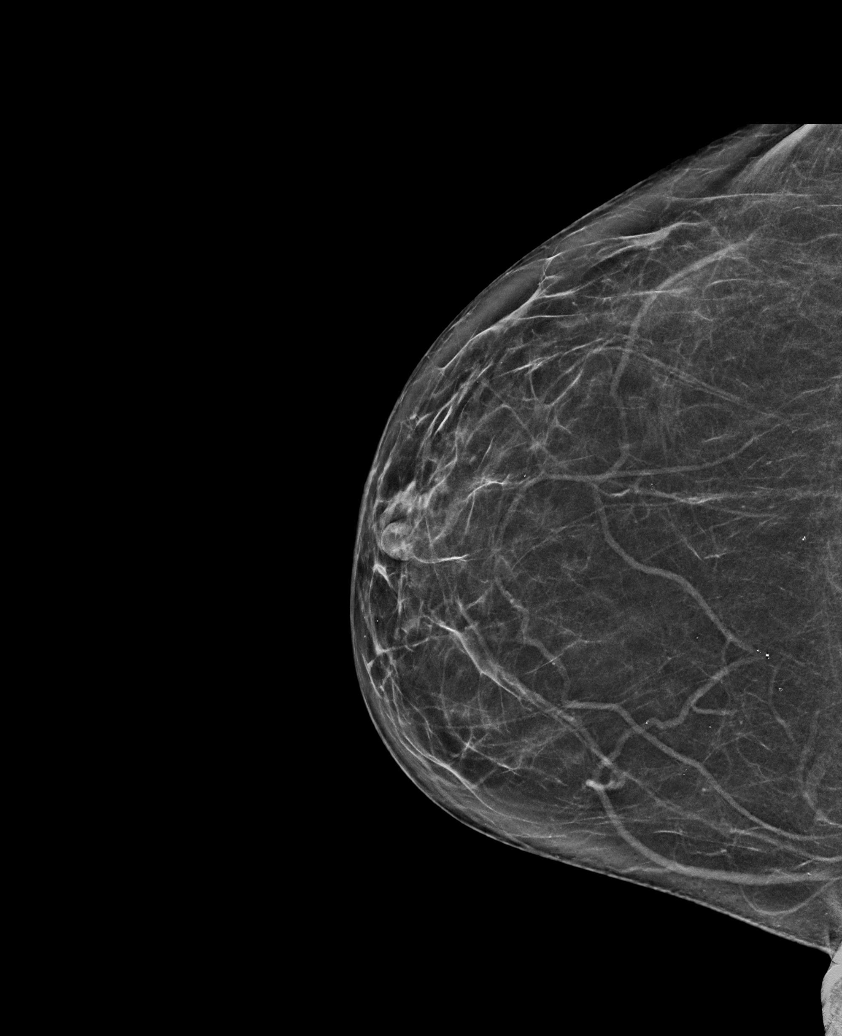

[6 of 31 positions shown; findings below may reference images not displayed]

ACR Breast Density Category b: There are scattered areas of
fibroglandular density.
FINDINGS: Mammographically, there are no suspicious masses, areas of
nonsurgical architectural distortion or microcalcifications in
either breast. Stable post lumpectomy and posttreatment changes in
the left breast.

Mammographic images were processed with CAD.
IMPRESSION: No mammographic evidence of malignancy in either breast, status post
left lumpectomy.

RECOMMENDATION:
Diagnostic mammogram is suggested in 1 year. (Code:[id number])

I have discussed the findings and recommendations with the patient.
Results were also provided in writing at the conclusion of the
visit. If applicable, a reminder letter will be sent to the patient
regarding the next appointment.

BI-RADS CATEGORY  2: Benign.

## 2019-12-05 ENCOUNTER — Other Ambulatory Visit: Payer: Self-pay | Admitting: Internal Medicine

## 2019-12-05 DIAGNOSIS — K5900 Constipation, unspecified: Secondary | ICD-10-CM

## 2020-01-04 ENCOUNTER — Other Ambulatory Visit: Payer: Self-pay | Admitting: Oncology

## 2020-01-04 ENCOUNTER — Other Ambulatory Visit: Payer: Self-pay | Admitting: Internal Medicine

## 2020-01-04 ENCOUNTER — Ambulatory Visit: Payer: Medicare Other | Admitting: Internal Medicine

## 2020-01-06 ENCOUNTER — Other Ambulatory Visit: Payer: Self-pay | Admitting: Internal Medicine

## 2020-01-06 DIAGNOSIS — M159 Polyosteoarthritis, unspecified: Secondary | ICD-10-CM

## 2020-01-09 ENCOUNTER — Other Ambulatory Visit: Payer: Self-pay | Admitting: Internal Medicine

## 2020-01-09 DIAGNOSIS — M159 Polyosteoarthritis, unspecified: Secondary | ICD-10-CM

## 2020-01-10 NOTE — Telephone Encounter (Signed)
Please deny.  Patient has an appointment 01/13/2020.

## 2020-01-11 ENCOUNTER — Other Ambulatory Visit: Payer: Self-pay | Admitting: *Deleted

## 2020-01-11 MED ORDER — ASPIRIN EC 81 MG PO TBEC: 81.0000 mg | DELAYED_RELEASE_TABLET | Freq: Every day | ORAL | 2 refills | Status: DC

## 2020-01-12 ENCOUNTER — Other Ambulatory Visit: Payer: Self-pay

## 2020-01-13 ENCOUNTER — Encounter: Payer: Self-pay | Admitting: Internal Medicine

## 2020-01-13 ENCOUNTER — Ambulatory Visit (INDEPENDENT_AMBULATORY_CARE_PROVIDER_SITE_OTHER): Payer: Medicare Other | Admitting: Internal Medicine

## 2020-01-13 VITALS — BP 140/90 | HR 74 | Temp 97.8°F | Wt 218.8 lb

## 2020-01-13 DIAGNOSIS — I10 Essential (primary) hypertension: Secondary | ICD-10-CM | POA: Diagnosis not present

## 2020-01-13 DIAGNOSIS — M159 Polyosteoarthritis, unspecified: Secondary | ICD-10-CM

## 2020-01-13 MED ORDER — HYDROCODONE-ACETAMINOPHEN 5-325 MG PO TABS: 1.0000 | ORAL_TABLET | Freq: Four times a day (QID) | ORAL | 0 refills | Status: DC | PRN

## 2020-01-13 NOTE — Progress Notes (Signed)
Established Patient Office Visit     This visit occurred during the SARS-CoV-2 public health emergency.  Safety protocols were in place, including screening questions prior to the visit, additional usage of staff PPE, and extensive cleaning of exam room while observing appropriate contact time as indicated for disinfecting solutions.    CC/Reason for Visit: Medication refills  HPI: Carlisa Cuadros is a 69 y.o. female who is coming in today for the above mentioned reasons.  She is here today for her 9-month hydrocodone refills per opioid contract.  She is due to sign a contract today.  She is on opioids due to generalized osteoarthritis as well as pain from multiple weight loss surgeries.  Past Medical/Surgical History: Past Medical History:  Diagnosis Date  . Arthritis    knees   . Asthma    rare;only when around alot of dust-Ventolin inhaler as needed  . Breast cancer ([place])     left. States she did not have lymph nodes removed  . Bursitis of right shoulder   . Cellulitis and abscess of leg   . Complication of anesthesia    was told 01/06/14 that airway was small  . Depression    takes Effexor and Wellbutrin daily  . Hearing loss   . History of bronchitis 1966  . Hyperlipidemia   . Hypertension    takes Coreg and Losartan daily  . Joint pain   . Joint swelling   . Obese   . Peripheral edema    takes Furosemide daily as needed and Potassium daily  . Personal history of radiation therapy 2015  . Radiation 03/08/14-04/26/14   50.4 gray to left breast. Lumpectomy cavity boosted to 64.4 gray  . Restless legs syndrome    takes depakote  . Sleep apnea, obstructive    uses CPAP  . Wears glasses     Past Surgical History:  Procedure Laterality Date  . BREAST LUMPECTOMY Left 2015  . BREAST LUMPECTOMY WITH NEEDLE LOCALIZATION Left 01/24/2014   Procedure: BREAST LUMPECTOMY WITH NEEDLE LOCALIZATION;  Surgeon: Edward Jolly, MD;  Location: [facility];  Service: General;   Laterality: Left;  . COLONOSCOPY    . COLONOSCOPY N/A 03/20/2017   Procedure: COLONOSCOPY;  Surgeon: Mauri Pole, MD;  Location: [facility];  Service: Endoscopy;  Laterality: N/A;  . DILATATION & CURETTAGE/HYSTEROSCOPY WITH TRUECLEAR N/A 01/06/2014   Procedure: DILATATION & CURETTAGE/HYSTEROSCOPY WITH TRUCLEAR;  Surgeon: Shon Millet II, MD;  Location: [facility];  Service: Gynecology;  Laterality: N/A;  . HYSTEROPLASTY  01/2014  . INNER EAR SURGERY Bilateral    for hearing loss  . JOINT REPLACEMENT Right    Knee  . LAPAROSCOPIC GASTRIC SLEEVE RESECTION N/A 01/28/2016   Procedure: LAPAROSCOPIC GASTRIC SLEEVE RESECTION WITH UPPER ENDO;  Surgeon: Excell Seltzer, MD;  Location: [facility];  Service: General;  Laterality: N/A;  . LIPOSUCTION WITH LIPOFILLING Bilateral 06/20/2019   Procedure: BILATERAL THIGH LIPECTOMY;  Surgeon: Irene Limbo, MD;  Location: [facility];  Service: Plastics;  Laterality: Bilateral;  . PANNICULECTOMY N/A 06/18/2018   Procedure: PANNICULECTOMY;  Surgeon: Irene Limbo, MD;  Location: [facility];  Service: Plastics;  Laterality: N/A;  . RE-EXCISION OF BREAST LUMPECTOMY Left 02/02/2014   Procedure: RE-EXCISION OF LEFT BREAST LUMPECTOMY;  Surgeon: Edward Jolly, MD;  Location: [facility];  Service: General;  Laterality: Left;  . TUBAL LIGATION      Social History:  reports that she has never smoked. She has never used  smokeless tobacco. She reports that she does not drink alcohol or use drugs.  Allergies: No Known Allergies  Family History:  Family History  Problem Relation Age of Onset  . Dementia Mother   . Heart attack Mother   . Esophageal cancer Father        also had stomach cancer  . Heart attack Father   . Asthma Other   . Hypertension Other   . Thyroid disease Other   . Heart attack Other   . Throat cancer Paternal Grandfather      Current Outpatient Medications:  .  albuterol (VENTOLIN HFA) 108 (90 Base) MCG/ACT inhaler,  Inhale 2 puffs into the lungs every 6 (six) hours as needed for wheezing or shortness of breath., Disp: 36 each, Rfl: 2 .  AMITIZA 24 MCG capsule, TAKE 1 CAPSULE BY MOUTH TWICE A DAY WITH A MEAL, Disp: 60 capsule, Rfl: 1 .  amLODipine (NORVASC) 2.5 MG tablet, Take 1 tablet (2.5 mg total) by mouth daily., Disp: 90 tablet, Rfl: 2 .  aspirin EC 81 MG tablet, Take 1 tablet (81 mg total) by mouth at bedtime., Disp: 90 tablet, Rfl: 2 .  atorvastatin (LIPITOR) 20 MG tablet, TAKE 1 TABLET BY MOUTH ONCE DAILY AT 6 PM., Disp: 30 tablet, Rfl: 5 .  buPROPion (WELLBUTRIN SR) 150 MG 12 hr tablet, Take 1 tablet (150 mg total) by mouth daily., Disp: 90 tablet, Rfl: 1 .  carvedilol (COREG) 12.5 MG tablet, TAKE 1 TABLET (12.5 MG TOTAL) BY MOUTH 2 (TWO) TIMES DAILY WITH A MEAL., Disp: 60 tablet, Rfl: 5 .  clindamycin (CLINDAGEL) 1 % gel, Apply topically 2 (two) times daily. (Patient taking differently: Apply 1 application topically 2 (two) times daily as needed (rash). ), Disp: 30 g, Rfl: 0 .  flintstones complete (FLINTSTONES) 60 MG chewable tablet, Chew 1 tablet by mouth daily., Disp: , Rfl:  .  furosemide (LASIX) 40 MG tablet, Take 0.5 tablets (20 mg total) by mouth daily as needed for edema., Disp: 45 tablet, Rfl: 2 .  HYDROcodone-acetaminophen (NORCO) 5-325 MG tablet, Take 1 tablet by mouth every 6 (six) hours as needed for moderate pain., Disp: 60 tablet, Rfl: 0 .  HYDROcodone-acetaminophen (NORCO) 5-325 MG tablet, Take 1 tablet by mouth every 6 (six) hours as needed for moderate pain., Disp: 60 tablet, Rfl: 0 .  HYDROcodone-acetaminophen (NORCO) 5-325 MG tablet, Take 1 tablet by mouth every 6 (six) hours as needed for moderate pain., Disp: 60 tablet, Rfl: 0 .  HYDROcodone-acetaminophen (NORCO/VICODIN) 5-325 MG tablet, Take 1 tablet by mouth every 12 (twelve) hours as needed for moderate pain (To take more frequent after surgery)., Disp: 20 tablet, Rfl: 0 .  HYSEPT 0.25 % SOLN, APPLY TOPICALLY ONCE FOR 1 DOSE.  APPLY TO GAUZE AND PLACE IN WOUNDS ONCE DAILY, Disp: , Rfl:  .  ibuprofen (ADVIL) 200 MG tablet, Take 400 mg by mouth every 6 (six) hours as needed for headache or moderate pain., Disp: , Rfl:  .  losartan (COZAAR) 100 MG tablet, TAKE 1 TABLET BY MOUTH ONCE DAILY, Disp: 90 tablet, Rfl: 1 .  meclizine (ANTIVERT) 25 MG tablet, TAKE 1 TABLET BY MOUTH DAILY AS NEEDED FOR DIZZINESS, Disp: 30 tablet, Rfl: 0 .  NATURAL VITAMIN D-3 125 MCG (5000 UT) TABS, Take 1 tablet by mouth daily., Disp: , Rfl:  .  nystatin cream (MYCOSTATIN), Apply 1 application topically daily as needed (rash). , Disp: , Rfl:  .  pantoprazole (PROTONIX) 40 MG tablet, TAKE 1  TABLET BY MOUTH DAILY., Disp: 30 tablet, Rfl: 5 .  traZODone (DESYREL) 50 MG tablet, Take 1 tablet (50 mg total) by mouth at bedtime., Disp: 90 tablet, Rfl: 1 .  triamcinolone cream (KENALOG) 0.1 %, Apply 1 application topically 2 (two) times daily as needed for rash., Disp: , Rfl:  .  venlafaxine XR (EFFEXOR-XR) 150 MG 24 hr capsule, TAKE 1 CAPSULE BY MOUTH EVERY DAY WITH BREAKFAST, Disp: 30 capsule, Rfl: 2  Review of Systems:  Constitutional: Denies fever, chills, diaphoresis, appetite change and fatigue.  HEENT: Denies photophobia, eye pain, redness, hearing loss, ear pain, congestion, sore throat, rhinorrhea, sneezing, mouth sores, trouble swallowing, neck pain, neck stiffness and tinnitus.   Respiratory: Denies SOB, DOE, cough, chest tightness,  and wheezing.   Cardiovascular: Denies chest pain, palpitations and leg swelling.  Gastrointestinal: Denies nausea, vomiting, abdominal pain, diarrhea, constipation, blood in stool and abdominal distention.  Genitourinary: Denies dysuria, urgency, frequency, hematuria, flank pain and difficulty urinating.  Endocrine: Denies: hot or cold intolerance, sweats, changes in hair or nails, polyuria, polydipsia. Musculoskeletal: Denies myalgias, back pain, joint swelling, arthralgias and gait problem.  Skin: Denies pallor,  rash and wound.  Neurological: Denies dizziness, seizures, syncope, weakness, light-headedness, numbness and headaches.  Hematological: Denies adenopathy. Easy bruising, personal or family bleeding history  Psychiatric/Behavioral: Denies suicidal ideation, mood changes, confusion, nervousness, sleep disturbance and agitation    Physical Exam: Vitals:   01/13/20 0708  BP: 140/90  Pulse: 74  Temp: 97.8 F (36.6 C)  TempSrc: Temporal  SpO2: 97%  Weight: 218 lb 12.8 oz (99.2 kg)    Body mass index is 35.32 kg/m.   Constitutional: NAD, calm, comfortable Eyes: PERRL, lids and conjunctivae normal, wears corrective lenses ENMT: Mucous membranes are moist.  Respiratory: clear to auscultation bilaterally, no wheezing, no crackles. Normal respiratory effort. No accessory muscle use.  Cardiovascular: Regular rate and rhythm, no murmurs / rubs / gallops. No extremity edema.  Neurologic: Grossly intact and nonfocal Psychiatric: Normal judgment and insight. Alert and oriented x 3. Normal mood.    Impression and Plan:  Essential hypertension -Blood pressure is above goal today.  She will do ambulatory blood pressure monitoring and return in 3 months for follow-up.  Generalized osteoarthritis of multiple sites   [place] reviewed in EPIC   Indication for chronic opioid:  Generalized osteoarthritis and chronic pain from multiple weight loss surgeries Medication and dose:  Hydrocodone 5/325 1 tablet every 12 hours as needed # pills per month:  60 Last UDS date:  None on file Opioid Treatment Agreement signed:  Yes Opioid Treatment Agreement last reviewed with patient:  01/13/2020 [facility] reviewed this encounter (include red flags):   Yes, no red flags, overdose risk score 210     Patient Instructions  -Nice seeing you today!!  -Schedule a 3 month follow up for your wellness visit with me.      Lelon Frohlich, MD [facility]

## 2020-01-13 NOTE — Patient Instructions (Signed)
-  Nice seeing you today!!  -Schedule a 3 month follow up for your wellness visit with me.

## 2020-01-17 ENCOUNTER — Telehealth: Payer: Self-pay | Admitting: Internal Medicine

## 2020-01-17 MED ORDER — ASPIRIN EC 81 MG PO TBEC: 81.0000 mg | DELAYED_RELEASE_TABLET | Freq: Every day | ORAL | 2 refills | Status: DC

## 2020-01-17 NOTE — Telephone Encounter (Signed)
Medication Refill: Aspirin 81 MG Pharmacy: [facility] [phone]

## 2020-01-26 ENCOUNTER — Encounter: Payer: Self-pay | Admitting: Internal Medicine

## 2020-02-05 ENCOUNTER — Other Ambulatory Visit: Payer: Self-pay | Admitting: Internal Medicine

## 2020-02-05 ENCOUNTER — Other Ambulatory Visit: Payer: Self-pay | Admitting: Family Medicine

## 2020-02-05 DIAGNOSIS — R600 Localized edema: Secondary | ICD-10-CM

## 2020-02-05 DIAGNOSIS — I10 Essential (primary) hypertension: Secondary | ICD-10-CM

## 2020-02-07 NOTE — Telephone Encounter (Signed)
Okay to fill MECLIZINE 25 MG TABLET?

## 2020-02-12 DIAGNOSIS — Z23 Encounter for immunization: Secondary | ICD-10-CM | POA: Diagnosis not present

## 2020-02-13 ENCOUNTER — Other Ambulatory Visit: Payer: Self-pay | Admitting: Internal Medicine

## 2020-02-13 DIAGNOSIS — M159 Polyosteoarthritis, unspecified: Secondary | ICD-10-CM

## 2020-02-14 NOTE — Telephone Encounter (Signed)
Left message on machine for patient to call the pharmacy for refills.  Please deny this refill.

## 2020-02-15 ENCOUNTER — Encounter: Payer: Self-pay | Admitting: Internal Medicine

## 2020-02-15 ENCOUNTER — Other Ambulatory Visit: Payer: Self-pay | Admitting: Internal Medicine

## 2020-02-15 MED ORDER — TRULANCE 3 MG PO TABS: 1.0000 | ORAL_TABLET | Freq: Every day | ORAL | 2 refills | Status: DC

## 2020-02-22 DIAGNOSIS — H43393 Other vitreous opacities, bilateral: Secondary | ICD-10-CM | POA: Diagnosis not present

## 2020-02-22 DIAGNOSIS — H43813 Vitreous degeneration, bilateral: Secondary | ICD-10-CM | POA: Diagnosis not present

## 2020-02-22 DIAGNOSIS — H25813 Combined forms of age-related cataract, bilateral: Secondary | ICD-10-CM | POA: Diagnosis not present

## 2020-02-22 DIAGNOSIS — H2513 Age-related nuclear cataract, bilateral: Secondary | ICD-10-CM | POA: Diagnosis not present

## 2020-02-29 ENCOUNTER — Encounter: Payer: Self-pay | Admitting: Internal Medicine

## 2020-03-05 ENCOUNTER — Other Ambulatory Visit: Payer: Self-pay | Admitting: Family

## 2020-03-11 ENCOUNTER — Encounter: Payer: Self-pay | Admitting: Internal Medicine

## 2020-03-13 DIAGNOSIS — Z6841 Body Mass Index (BMI) 40.0 and over, adult: Secondary | ICD-10-CM | POA: Diagnosis not present

## 2020-03-13 DIAGNOSIS — Z9884 Bariatric surgery status: Secondary | ICD-10-CM | POA: Insufficient documentation

## 2020-03-13 DIAGNOSIS — Z9889 Other specified postprocedural states: Secondary | ICD-10-CM | POA: Insufficient documentation

## 2020-03-13 DIAGNOSIS — N762 Acute vulvitis: Secondary | ICD-10-CM | POA: Diagnosis not present

## 2020-03-13 DIAGNOSIS — N952 Postmenopausal atrophic vaginitis: Secondary | ICD-10-CM | POA: Diagnosis not present

## 2020-03-13 DIAGNOSIS — Z124 Encounter for screening for malignant neoplasm of cervix: Secondary | ICD-10-CM | POA: Diagnosis not present

## 2020-03-21 ENCOUNTER — Encounter: Payer: Self-pay | Admitting: Internal Medicine

## 2020-04-05 ENCOUNTER — Other Ambulatory Visit: Payer: Self-pay | Admitting: Oncology

## 2020-04-05 ENCOUNTER — Other Ambulatory Visit: Payer: Self-pay | Admitting: Internal Medicine

## 2020-04-11 ENCOUNTER — Encounter: Payer: Medicare Other | Admitting: Internal Medicine

## 2020-04-12 ENCOUNTER — Other Ambulatory Visit: Payer: Self-pay

## 2020-04-12 NOTE — Telephone Encounter (Signed)
mychart message sent by pt requesting a refill of her trazodone. Preferred pharmacy is [facility] [address]. Dr. Elsworth Soho, please advise.

## 2020-04-13 ENCOUNTER — Ambulatory Visit (INDEPENDENT_AMBULATORY_CARE_PROVIDER_SITE_OTHER): Payer: Medicare Other | Admitting: Internal Medicine

## 2020-04-13 ENCOUNTER — Encounter: Payer: Self-pay | Admitting: Internal Medicine

## 2020-04-13 ENCOUNTER — Encounter: Payer: Self-pay | Admitting: Gastroenterology

## 2020-04-13 VITALS — BP 130/80 | HR 50 | Temp 97.9°F | Ht 63.0 in | Wt 225.8 lb

## 2020-04-13 DIAGNOSIS — E559 Vitamin D deficiency, unspecified: Secondary | ICD-10-CM | POA: Diagnosis not present

## 2020-04-13 DIAGNOSIS — Z780 Asymptomatic menopausal state: Secondary | ICD-10-CM

## 2020-04-13 DIAGNOSIS — Z13820 Encounter for screening for osteoporosis: Secondary | ICD-10-CM | POA: Diagnosis not present

## 2020-04-13 DIAGNOSIS — E785 Hyperlipidemia, unspecified: Secondary | ICD-10-CM | POA: Diagnosis not present

## 2020-04-13 DIAGNOSIS — R7302 Impaired glucose tolerance (oral): Secondary | ICD-10-CM

## 2020-04-13 DIAGNOSIS — K219 Gastro-esophageal reflux disease without esophagitis: Secondary | ICD-10-CM

## 2020-04-13 DIAGNOSIS — Z0000 Encounter for general adult medical examination without abnormal findings: Secondary | ICD-10-CM

## 2020-04-13 DIAGNOSIS — I10 Essential (primary) hypertension: Secondary | ICD-10-CM

## 2020-04-13 DIAGNOSIS — M65341 Trigger finger, right ring finger: Secondary | ICD-10-CM | POA: Diagnosis not present

## 2020-04-13 DIAGNOSIS — D0512 Intraductal carcinoma in situ of left breast: Secondary | ICD-10-CM | POA: Diagnosis not present

## 2020-04-13 DIAGNOSIS — M159 Polyosteoarthritis, unspecified: Secondary | ICD-10-CM

## 2020-04-13 LAB — LIPID PANEL
Cholesterol: 196 mg/dL (ref 0–200)
HDL: 60.4 mg/dL (ref >39.00)
LDL Cholesterol: 104 mg/dL — ABNORMAL HIGH (ref 0–99)
NonHDL: 135.7
Total CHOL/HDL Ratio: 3
Triglycerides: 158 mg/dL — ABNORMAL HIGH (ref 0.0–149.0)
VLDL: 31.6 mg/dL (ref 0.0–40.0)

## 2020-04-13 LAB — CBC WITH DIFFERENTIAL/PLATELET
Basophils Absolute: 0 10*3/uL (ref 0.0–0.1)
Basophils Relative: 0.8 % (ref 0.0–3.0)
Eosinophils Absolute: 0.1 10*3/uL (ref 0.0–0.7)
Eosinophils Relative: 2.4 % (ref 0.0–5.0)
HCT: 37.2 % (ref 36.0–46.0)
Hemoglobin: 12.3 g/dL (ref 12.0–15.0)
Lymphocytes Relative: 29 % (ref 12.0–46.0)
Lymphs Abs: 1.4 10*3/uL (ref 0.7–4.0)
MCHC: 33 g/dL (ref 30.0–36.0)
MCV: 78.9 fl (ref 78.0–100.0)
Monocytes Absolute: 0.5 10*3/uL (ref 0.1–1.0)
Monocytes Relative: 11 % (ref 3.0–12.0)
Neutro Abs: 2.7 10*3/uL (ref 1.4–7.7)
Neutrophils Relative %: 56.8 % (ref 43.0–77.0)
Platelets: 219 10*3/uL (ref 150.0–400.0)
RBC: 4.72 Mil/uL (ref 3.87–5.11)
RDW: 15.5 % (ref 11.5–15.5)
WBC: 4.7 10*3/uL (ref 4.0–10.5)

## 2020-04-13 LAB — COMPREHENSIVE METABOLIC PANEL
ALT: 12 U/L (ref 0–35)
AST: 14 U/L (ref 0–37)
Albumin: 3.9 g/dL (ref 3.5–5.2)
Alkaline Phosphatase: 93 U/L (ref 39–117)
BUN: 17 mg/dL (ref 6–23)
CO2: 30 mEq/L (ref 19–32)
Calcium: 9 mg/dL (ref 8.4–10.5)
Chloride: 103 mEq/L (ref 96–112)
Creatinine, Ser: 0.78 mg/dL (ref 0.40–1.20)
GFR: 73 mL/min (ref >60.00)
Glucose, Bld: 92 mg/dL (ref 70–99)
Potassium: 4.5 mEq/L (ref 3.5–5.1)
Sodium: 139 mEq/L (ref 135–145)
Total Bilirubin: 0.4 mg/dL (ref 0.2–1.2)
Total Protein: 6.5 g/dL (ref 6.0–8.3)

## 2020-04-13 LAB — VITAMIN D 25 HYDROXY (VIT D DEFICIENCY, FRACTURES): VITD: 50.37 ng/mL (ref 30.00–100.00)

## 2020-04-13 LAB — HEMOGLOBIN A1C: Hgb A1c MFr Bld: 5.8 % (ref 4.6–6.5)

## 2020-04-13 MED ORDER — HYDROCODONE-ACETAMINOPHEN 5-325 MG PO TABS: 1.0000 | ORAL_TABLET | Freq: Four times a day (QID) | ORAL | 0 refills | Status: DC | PRN

## 2020-04-13 MED ORDER — TRAZODONE HCL 50 MG PO TABS: 50.0000 mg | ORAL_TABLET | Freq: Every day | ORAL | 1 refills | Status: DC

## 2020-04-13 MED ORDER — LINACLOTIDE 290 MCG PO CAPS: ORAL_CAPSULE | ORAL | 2 refills | Status: DC

## 2020-04-13 NOTE — Addendum Note (Signed)
Addended by: Elmer Picker on: 04/13/2020 09:29 AM   Modules accepted: Orders

## 2020-04-13 NOTE — Patient Instructions (Signed)
-Nice seeing you today!!  -Lab work today; will notify you once results are available.  -Will send you to see orthopedics for your finger.  -See you back in 3 months.   Preventive Care 20 Years and Older, Female Preventive care refers to lifestyle choices and visits with your health care provider that can promote health and wellness. This includes:  A yearly physical exam. This is also called an annual well check.  Regular dental and eye exams.  Immunizations.  Screening for certain conditions.  Healthy lifestyle choices, such as diet and exercise. What can I expect for my preventive care visit? Physical exam Your health care provider will check:  Height and weight. These may be used to calculate body mass index (BMI), which is a measurement that tells if you are at a healthy weight.  Heart rate and blood pressure.  Your skin for abnormal spots. Counseling Your health care provider may ask you questions about:  Alcohol, tobacco, and drug use.  Emotional well-being.  Home and relationship well-being.  Sexual activity.  Eating habits.  History of falls.  Memory and ability to understand (cognition).  Work and work Statistician.  Pregnancy and menstrual history. What immunizations do I need?  Influenza (flu) vaccine  This is recommended every year. Tetanus, diphtheria, and pertussis (Tdap) vaccine  You may need a Td booster every 10 years. Varicella (chickenpox) vaccine  You may need this vaccine if you have not already been vaccinated. Zoster (shingles) vaccine  You may need this after age 59. Pneumococcal conjugate (PCV13) vaccine  One dose is recommended after age 51. Pneumococcal polysaccharide (PPSV23) vaccine  One dose is recommended after age 55. Measles, mumps, and rubella (MMR) vaccine  You may need at least one dose of MMR if you were born in 1957 or later. You may also need a second dose. Meningococcal conjugate (MenACWY) vaccine  You  may need this if you have certain conditions. Hepatitis A vaccine  You may need this if you have certain conditions or if you travel or work in places where you may be exposed to hepatitis A. Hepatitis B vaccine  You may need this if you have certain conditions or if you travel or work in places where you may be exposed to hepatitis B. Haemophilus influenzae type b (Hib) vaccine  You may need this if you have certain conditions. You may receive vaccines as individual doses or as more than one vaccine together in one shot (combination vaccines). Talk with your health care provider about the risks and benefits of combination vaccines. What tests do I need? Blood tests  Lipid and cholesterol levels. These may be checked every 5 years, or more frequently depending on your overall health.  Hepatitis C test.  Hepatitis B test. Screening  Lung cancer screening. You may have this screening every year starting at age 71 if you have a 30-pack-year history of smoking and currently smoke or have quit within the past 15 years.  Colorectal cancer screening. All adults should have this screening starting at age 15 and continuing until age 67. Your health care provider may recommend screening at age 58 if you are at increased risk. You will have tests every 1-10 years, depending on your results and the type of screening test.  Diabetes screening. This is done by checking your blood sugar (glucose) after you have not eaten for a while (fasting). You may have this done every 1-3 years.  Mammogram. This may be done every 1-2 years. Talk  with your health care provider about how often you should have regular mammograms.  BRCA-related cancer screening. This may be done if you have a family history of breast, ovarian, tubal, or peritoneal cancers. Other tests  Sexually transmitted disease (STD) testing.  Bone density scan. This is done to screen for osteoporosis. You may have this done starting at age  19. Follow these instructions at home: Eating and drinking  Eat a diet that includes fresh fruits and vegetables, whole grains, lean protein, and low-fat dairy products. Limit your intake of foods with high amounts of sugar, saturated fats, and salt.  Take vitamin and mineral supplements as recommended by your health care provider.  Do not drink alcohol if your health care provider tells you not to drink.  If you drink alcohol: ? Limit how much you have to 0-1 drink a day. ? Be aware of how much alcohol is in your drink. In the [place] one drink equals one 12 oz bottle of beer (355 mL), one 5 oz glass of wine (148 mL), or one 1 oz glass of hard liquor (44 mL). Lifestyle  Take daily care of your teeth and gums.  Stay active. Exercise for at least 30 minutes on 5 or more days each week.  Do not use any products that contain nicotine or tobacco, such as cigarettes, e-cigarettes, and chewing tobacco. If you need help quitting, ask your health care provider.  If you are sexually active, practice safe sex. Use a condom or other form of protection in order to prevent STIs (sexually transmitted infections).  Talk with your health care provider about taking a low-dose aspirin or statin. What's next?  Go to your health care provider once a year for a well check visit.  Ask your health care provider how often you should have your eyes and teeth checked.  Stay up to date on all vaccines. This information is not intended to replace advice given to you by your health care provider. Make sure you discuss any questions you have with your health care provider. Document Revised: 11/11/2018 Document Reviewed: 11/11/2018 Elsevier Patient Education  2020 [employer].

## 2020-04-13 NOTE — Telephone Encounter (Signed)
Okay to refill x1 For further refills, contact PCP, otherwise she will need 66-month follow-up office visit with [place] rather than her usual 1 year

## 2020-04-13 NOTE — Progress Notes (Signed)
Established Patient Office Visit     This visit occurred during the SARS-CoV-2 public health emergency.  Safety protocols were in place, including screening questions prior to the visit, additional usage of staff PPE, and extensive cleaning of exam room while observing appropriate contact time as indicated for disinfecting solutions.    CC/Reason for Visit: Subsequent Medicare wellness visit and follow-up chronic medical conditions  HPI: Tamara [name] is a 69 y.o. female who is coming in today for the above mentioned reasons. Past Medical History is significant for: Morbid obesity status post gastric sleeve surgery with over 100 pound weight loss who has had abdominal and thigh skin removed, thigh skin removal was most recent in July of this year.  Subsequent to that she developed right thigh seroma and cellulitis that required drainage and antibiotics.  She also has a prior history of breast cancer followed by Dr. Jana Hakim, vitamin D deficiency, hyperlipidemia, obstructive sleep apnea on nightly CPAP followed by pulmonology, history of hypertension and GERD.  She is due to renew her pain contract for hydrocodone that she takes for chronic pain.  She is also complaining of a right ring trigger finger..  She had a colonoscopy in 2018 and is a 3-year callback, mammogram is updated, she saw GYN last month.   Past Medical/Surgical History: Past Medical History:  Diagnosis Date  . Arthritis    knees   . Asthma    rare;only when around alot of dust-Ventolin inhaler as needed  . Breast cancer ([place])     left. States she did not have lymph nodes removed  . Bursitis of right shoulder   . Cellulitis and abscess of leg   . Complication of anesthesia    was told 01/06/14 that airway was small  . Depression    takes Effexor and Wellbutrin daily  . Hearing loss   . History of bronchitis 1966  . Hyperlipidemia   . Hypertension    takes Coreg and Losartan daily  . Joint pain   . Joint  swelling   . Obese   . Peripheral edema    takes Furosemide daily as needed and Potassium daily  . Personal history of radiation therapy 2015  . Radiation 03/08/14-04/26/14   50.4 gray to left breast. Lumpectomy cavity boosted to 64.4 gray  . Restless legs syndrome    takes depakote  . Sleep apnea, obstructive    uses CPAP  . Wears glasses     Past Surgical History:  Procedure Laterality Date  . BREAST LUMPECTOMY Left 2015  . BREAST LUMPECTOMY WITH NEEDLE LOCALIZATION Left 01/24/2014   Procedure: BREAST LUMPECTOMY WITH NEEDLE LOCALIZATION;  Surgeon: Edward Jolly, MD;  Location: [facility];  Service: General;  Laterality: Left;  . COLONOSCOPY    . COLONOSCOPY N/A 03/20/2017   Procedure: COLONOSCOPY;  Surgeon: Mauri Pole, MD;  Location: [facility];  Service: Endoscopy;  Laterality: N/A;  . DILATATION & CURETTAGE/HYSTEROSCOPY WITH TRUECLEAR N/A 01/06/2014   Procedure: DILATATION & CURETTAGE/HYSTEROSCOPY WITH TRUCLEAR;  Surgeon: Shon Millet II, MD;  Location: [facility];  Service: Gynecology;  Laterality: N/A;  . HYSTEROPLASTY  01/2014  . INNER EAR SURGERY Bilateral    for hearing loss  . JOINT REPLACEMENT Right    Knee  . LAPAROSCOPIC GASTRIC SLEEVE RESECTION N/A 01/28/2016   Procedure: LAPAROSCOPIC GASTRIC SLEEVE RESECTION WITH UPPER ENDO;  Surgeon: Excell Seltzer, MD;  Location: [facility];  Service: General;  Laterality: N/A;  . LIPOSUCTION WITH LIPOFILLING Bilateral  06/20/2019   Procedure: BILATERAL THIGH LIPECTOMY;  Surgeon: Irene Limbo, MD;  Location: [facility];  Service: Plastics;  Laterality: Bilateral;  . PANNICULECTOMY N/A 06/18/2018   Procedure: PANNICULECTOMY;  Surgeon: Irene Limbo, MD;  Location: [facility];  Service: Plastics;  Laterality: N/A;  . RE-EXCISION OF BREAST LUMPECTOMY Left 02/02/2014   Procedure: RE-EXCISION OF LEFT BREAST LUMPECTOMY;  Surgeon: Edward Jolly, MD;  Location: [facility];  Service: General;  Laterality: Left;  . TUBAL  LIGATION      Social History:  reports that she has never smoked. She has never used smokeless tobacco. She reports that she does not drink alcohol or use drugs.  Allergies: No Known Allergies  Family History:  Family History  Problem Relation Age of Onset  . Dementia Mother   . Heart attack Mother   . Esophageal cancer Father        also had stomach cancer  . Heart attack Father   . Asthma Other   . Hypertension Other   . Thyroid disease Other   . Heart attack Other   . Throat cancer Paternal Grandfather      Current Outpatient Medications:  .  albuterol (VENTOLIN HFA) 108 (90 Base) MCG/ACT inhaler, Inhale 2 puffs into the lungs every 6 (six) hours as needed for wheezing or shortness of breath., Disp: 36 each, Rfl: 2 .  amLODipine (NORVASC) 2.5 MG tablet, TAKE 1 TABLET BY MOUTH EVERY DAY, Disp: 90 tablet, Rfl: 0 .  aspirin EC 81 MG tablet, Take 1 tablet (81 mg total) by mouth at bedtime., Disp: 90 tablet, Rfl: 2 .  buPROPion (WELLBUTRIN SR) 150 MG 12 hr tablet, Take 1 tablet (150 mg total) by mouth daily., Disp: 90 tablet, Rfl: 1 .  carvedilol (COREG) 12.5 MG tablet, TAKE 1 TABLET (12.5 MG TOTAL) BY MOUTH 2 (TWO) TIMES DAILY WITH A MEAL., Disp: 60 tablet, Rfl: 5 .  clindamycin (CLINDAGEL) 1 % gel, Apply topically 2 (two) times daily. (Patient taking differently: Apply 1 application topically 2 (two) times daily as needed (rash). ), Disp: 30 g, Rfl: 0 .  flintstones complete (FLINTSTONES) 60 MG chewable tablet, Chew 1 tablet by mouth daily., Disp: , Rfl:  .  furosemide (LASIX) 40 MG tablet, TAKE 1/2 TABLET BY MOUTH ONCE DAILY AS NEEDED FOR EDEMA, Disp: 45 tablet, Rfl: 0 .  HYDROcodone-acetaminophen (NORCO) 5-325 MG tablet, Take 1 tablet by mouth every 6 (six) hours as needed for moderate pain., Disp: 60 tablet, Rfl: 0 .  HYDROcodone-acetaminophen (NORCO) 5-325 MG tablet, Take 1 tablet by mouth every 6 (six) hours as needed for moderate pain., Disp: 60 tablet, Rfl: 0 .   HYDROcodone-acetaminophen (NORCO) 5-325 MG tablet, Take 1 tablet by mouth every 6 (six) hours as needed for moderate pain., Disp: 60 tablet, Rfl: 0 .  HYDROcodone-acetaminophen (NORCO/VICODIN) 5-325 MG tablet, Take 1 tablet by mouth every 6 (six) hours as needed for moderate pain., Disp: 60 tablet, Rfl: 0 .  HYSEPT 0.25 % SOLN, APPLY TOPICALLY ONCE FOR 1 DOSE. APPLY TO GAUZE AND PLACE IN WOUNDS ONCE DAILY, Disp: , Rfl:  .  ibuprofen (ADVIL) 200 MG tablet, Take 400 mg by mouth every 6 (six) hours as needed for headache or moderate pain., Disp: , Rfl:  .  linaclotide (LINZESS) 290 MCG CAPS capsule, Please specify directions, refills and quantity, Disp: 30 capsule, Rfl: 2 .  losartan (COZAAR) 100 MG tablet, TAKE 1 TABLET BY MOUTH ONCE DAILY, Disp: 90 tablet, Rfl: 1 .  meclizine (ANTIVERT) 25 MG tablet, TAKE 1 TABLET BY MOUTH DAILY AS NEEDED FOR DIZZINESS, Disp: 30 tablet, Rfl: 2 .  NATURAL VITAMIN D-3 125 MCG (5000 UT) TABS, Take 1 tablet by mouth daily., Disp: , Rfl:  .  nystatin cream (MYCOSTATIN), Apply 1 application topically daily as needed (rash). , Disp: , Rfl:  .  pantoprazole (PROTONIX) 40 MG tablet, TAKE 1 TABLET BY MOUTH DAILY., Disp: 30 tablet, Rfl: 5 .  traZODone (DESYREL) 50 MG tablet, Take 1 tablet (50 mg total) by mouth at bedtime., Disp: 90 tablet, Rfl: 1 .  triamcinolone cream (KENALOG) 0.1 %, Apply 1 application topically 2 (two) times daily as needed for rash., Disp: , Rfl:  .  venlafaxine XR (EFFEXOR-XR) 150 MG 24 hr capsule, TAKE 1 CAPSULE BY MOUTH EVERY DAY WITH BREAKFAST, Disp: 30 capsule, Rfl: 2  Review of Systems:  Constitutional: Denies fever, chills, diaphoresis, appetite change and fatigue.  HEENT: Denies photophobia, eye pain, redness, hearing loss, ear pain, congestion, sore throat, rhinorrhea, sneezing, mouth sores, trouble swallowing, neck pain, neck stiffness and tinnitus.   Respiratory: Denies SOB, DOE, cough, chest tightness,  and wheezing.   Cardiovascular: Denies  chest pain, palpitations and leg swelling.  Gastrointestinal: Denies nausea, vomiting, abdominal pain, diarrhea, constipation, blood in stool and abdominal distention.  Genitourinary: Denies dysuria, urgency, frequency, hematuria, flank pain and difficulty urinating.  Endocrine: Denies: hot or cold intolerance, sweats, changes in hair or nails, polyuria, polydipsia. Musculoskeletal: Denies myalgias, back pain, joint swelling, arthralgias and gait problem.  Skin: Denies pallor, rash and wound.  Neurological: Denies dizziness, seizures, syncope, weakness, light-headedness, numbness and headaches.  Hematological: Denies adenopathy. Easy bruising, personal or family bleeding history  Psychiatric/Behavioral: Denies suicidal ideation, mood changes, confusion, nervousness, sleep disturbance and agitation    Physical Exam: Vitals:   04/13/20 0901  BP: 130/80  Pulse: (!) 50  Temp: 97.9 F (36.6 C)  TempSrc: Temporal  SpO2: 97%  Weight: 225 lb 12.8 oz (102.4 kg)  Height: 5' 3" (1.6 m)    Body mass index is 40 kg/m.   Constitutional: NAD, calm, comfortable, obese Eyes: PERRL, lids and conjunctivae normal, wears corrective lenses ENMT: Mucous membranes are moist. Tympanic membrane is pearly white, no erythema or bulging. Neck: normal, supple, no masses, no thyromegaly Respiratory: clear to auscultation bilaterally, no wheezing, no crackles. Normal respiratory effort. No accessory muscle use.  Cardiovascular: Regular rate and rhythm, no murmurs / rubs / gallops. No extremity edema. 2+ pedal pulses. No carotid bruits.  Abdomen: no tenderness, no masses palpated. No hepatosplenomegaly. Bowel sounds positive.  Musculoskeletal: no clubbing / cyanosis. No joint deformity upper and lower extremities. Good ROM, no contractures. Normal muscle tone.  Skin: no rashes, lesions, ulcers. No induration Neurologic: CN 2-12 grossly intact. Sensation intact, DTR normal. Strength 5/5 in all 4.  Psychiatric:  Normal judgment and insight. Alert and oriented x 3. Normal mood.   Subsequent Medicare wellness visit   1. Risk factors, based on past  M,S,F -cardiovascular disease risk factors include age, history of hyperlipidemia, history of hypertension   2.  Physical activities: She walks with her dog for about 20 minutes 3-4 times a day   3.  Depression/mood:  Even though she has a history of depression, her mood is stable and not depressed   4.  Hearing:  She wears bilateral hearing aids   5.  ADL's: Independent in all ADLs   6.  Fall risk:  Moderate fall risk due to prior history of  falls   7.  Home safety: No problems identified   8.  Height weight, and visual acuity: Height and weight as above, visual acuity is 20/32 with each eye independently and together   9.  Counseling:  We discussed healthy lifestyle, necessity of shingles vaccination   10. Lab orders based on risk factors: Laboratory update will be reviewed   11. Referral :  None today   12. Care plan:  Follow-up with me in 3 months   13. Cognitive assessment:  No cognitive impairment   14. Screening: Patient provided with a written and personalized 5-10 year screening schedule in the AVS.   yes   15. Provider List Update:   PCP, GYN Dr. Tombllin, ophthalmology.  16. Advance Directives: Full code     Office Visit from 04/13/2020 in [facility]  PHQ-9 Total Score  5      Fall Risk  04/13/2020 10/20/2018 06/22/2017 04/04/2016 02/12/2016  Falls in the past year? 0 1 Yes No No  Comment - Emmi Telephone Survey: data to providers prior to load Emmi Telephone Survey: data to providers prior to load - -  Number falls in past yr: 0 1 2 or more - -  Comment - Emmi Telephone Survey Actual Response = 4 Emmi Telephone Survey Actual Response = 3 - -  Injury with Fall? 0 1 No - -     Impression and Plan:  Encounter for preventive health examination -She has routine eye and dental care. -She is due for  shingles vaccination series, otherwise immunizations are up-to-date including Covid vaccination. -Screening labs today. -Healthy lifestyle discussed in detail. -She follows with GYN and saw them last month for cervical cancer screening. -She had a mammogram that was negative in October 2020. -She had a colonoscopy in 2018 and is a 3-year callback, she has already received a call from GI to schedule follow-up colonoscopy. -She is due for repeat DEXA scan.  Generalized osteoarthritis of multiple sites -PDMP has been reviewed, no red flags, overdose risk score is 160. -Refill hydrocodone 60 tablets x 3 months.  Trigger ring finger of right hand  - Plan: AMB referral to orthopedics  Gastroesophageal reflux disease, unspecified whether esophagitis present -well-controlled on PPI therapy.  Ductal carcinoma in situ (DCIS) of left breast -Followed by oncology.  Vitamin D deficiency  - Plan: VITAMIN D 25 Hydroxy (Vit-D Deficiency, Fractures)  Essential hypertension  -Well-controlled on current regimen.  Hyperlipidemia, acquired  - Plan: Lipid panel -Last LDL was 175 in 2019.  Will likely need to resume statin therapy pending results of lipids.    Patient Instructions  -Nice seeing you today!!  -Lab work today; will notify you once results are available.  -Will send you to see orthopedics for your finger.  -See you back in 3 months.   Preventive Care 65 Years and Older, Female Preventive care refers to lifestyle choices and visits with your health care provider that can promote health and wellness. This includes:  A yearly physical exam. This is also called an annual well check.  Regular dental and eye exams.  Immunizations.  Screening for certain conditions.  Healthy lifestyle choices, such as diet and exercise. What can I expect for my preventive care visit? Physical exam Your health care provider will check:  Height and weight. These may be used to calculate body  mass index (BMI), which is a measurement that tells if you are at a healthy weight.  Heart rate and blood pressure.    Your skin for abnormal spots. Counseling Your health care provider may ask you questions about:  Alcohol, tobacco, and drug use.  Emotional well-being.  Home and relationship well-being.  Sexual activity.  Eating habits.  History of falls.  Memory and ability to understand (cognition).  Work and work environment.  Pregnancy and menstrual history. What immunizations do I need?  Influenza (flu) vaccine  This is recommended every year. Tetanus, diphtheria, and pertussis (Tdap) vaccine  You may need a Td booster every 10 years. Varicella (chickenpox) vaccine  You may need this vaccine if you have not already been vaccinated. Zoster (shingles) vaccine  You may need this after age 60. Pneumococcal conjugate (PCV13) vaccine  One dose is recommended after age 65. Pneumococcal polysaccharide (PPSV23) vaccine  One dose is recommended after age 65. Measles, mumps, and rubella (MMR) vaccine  You may need at least one dose of MMR if you were born in 1957 or later. You may also need a second dose. Meningococcal conjugate (MenACWY) vaccine  You may need this if you have certain conditions. Hepatitis A vaccine  You may need this if you have certain conditions or if you travel or work in places where you may be exposed to hepatitis A. Hepatitis B vaccine  You may need this if you have certain conditions or if you travel or work in places where you may be exposed to hepatitis B. Haemophilus influenzae type b (Hib) vaccine  You may need this if you have certain conditions. You may receive vaccines as individual doses or as more than one vaccine together in one shot (combination vaccines). Talk with your health care provider about the risks and benefits of combination vaccines. What tests do I need? Blood tests  Lipid and cholesterol levels. These may be  checked every 5 years, or more frequently depending on your overall health.  Hepatitis C test.  Hepatitis B test. Screening  Lung cancer screening. You may have this screening every year starting at age 55 if you have a 30-pack-year history of smoking and currently smoke or have quit within the past 15 years.  Colorectal cancer screening. All adults should have this screening starting at age 50 and continuing until age 75. Your health care provider may recommend screening at age 45 if you are at increased risk. You will have tests every 1-10 years, depending on your results and the type of screening test.  Diabetes screening. This is done by checking your blood sugar (glucose) after you have not eaten for a while (fasting). You may have this done every 1-3 years.  Mammogram. This may be done every 1-2 years. Talk with your health care provider about how often you should have regular mammograms.  BRCA-related cancer screening. This may be done if you have a family history of breast, ovarian, tubal, or peritoneal cancers. Other tests  Sexually transmitted disease (STD) testing.  Bone density scan. This is done to screen for osteoporosis. You may have this done starting at age 65. Follow these instructions at home: Eating and drinking  Eat a diet that includes fresh fruits and vegetables, whole grains, lean protein, and low-fat dairy products. Limit your intake of foods with high amounts of sugar, saturated fats, and salt.  Take vitamin and mineral supplements as recommended by your health care provider.  Do not drink alcohol if your health care provider tells you not to drink.  If you drink alcohol: ? Limit how much you have to 0-1 drink a day. ? Be   aware of how much alcohol is in your drink. In the [place] one drink equals one 12 oz bottle of beer (355 mL), one 5 oz glass of wine (148 mL), or one 1 oz glass of hard liquor (44 mL). Lifestyle  Take daily care of your teeth and gums.   Stay active. Exercise for at least 30 minutes on 5 or more days each week.  Do not use any products that contain nicotine or tobacco, such as cigarettes, e-cigarettes, and chewing tobacco. If you need help quitting, ask your health care provider.  If you are sexually active, practice safe sex. Use a condom or other form of protection in order to prevent STIs (sexually transmitted infections).  Talk with your health care provider about taking a low-dose aspirin or statin. What's next?  Go to your health care provider once a year for a well check visit.  Ask your health care provider how often you should have your eyes and teeth checked.  Stay up to date on all vaccines. This information is not intended to replace advice given to you by your health care provider. Make sure you discuss any questions you have with your health care provider. Document Revised: 11/11/2018 Document Reviewed: 11/11/2018 Elsevier Patient Education  2020 [place] [facility]

## 2020-04-20 ENCOUNTER — Other Ambulatory Visit: Payer: Self-pay

## 2020-04-20 ENCOUNTER — Ambulatory Visit (INDEPENDENT_AMBULATORY_CARE_PROVIDER_SITE_OTHER): Payer: Medicare Other | Admitting: Family Medicine

## 2020-04-20 ENCOUNTER — Encounter: Payer: Self-pay | Admitting: Family Medicine

## 2020-04-20 DIAGNOSIS — N763 Subacute and chronic vulvitis: Secondary | ICD-10-CM | POA: Insufficient documentation

## 2020-04-20 DIAGNOSIS — M65341 Trigger finger, right ring finger: Secondary | ICD-10-CM | POA: Diagnosis not present

## 2020-04-20 MED ORDER — DICLOFENAC SODIUM 1 % EX GEL
4.0000 g | Freq: Four times a day (QID) | CUTANEOUS | 6 refills | Status: DC | PRN
Start: 2020-04-20 — End: 2020-08-21

## 2020-04-20 NOTE — Progress Notes (Signed)
Office Visit Note   Patient: Tamara [name]           Date of Birth: October 11, 1951           MRN: [id number] Visit Date: 04/20/2020 Requested by: Isaac Bliss, Rayford Halsted, MD [place],  [place] [id number] PCP: Isaac Bliss, Rayford Halsted, MD  Subjective: Chief Complaint  Patient presents with  . Right Hand - Pain    Pain in hand x 2 months, with ring finger getting stuck in flexed position. The finger clicks. Right hand dominant. Has not been able to do any crocheting (as she usually does a lot) due to the pain and swelling in that finger.    HPI: She is here with right fourth finger pain.  About 2 months ago her finger started getting stuck in flexion intermittently.  It seems to be worse when she is crocheting.  She stopped doing that for a while but the symptoms have persisted.  She is right-hand dominant.  She has a history of carpal release 30 years ago.  She gets occasional numbness and tingling in her fingers but not a lot.  She is not diabetic.                ROS:   All other systems were reviewed and are negative.  Objective: Vital Signs: There were no vitals taken for this visit.  Physical Exam:  General:  Alert and oriented, in no acute distress. Pulm:  Breathing unlabored. Psy:  Normal mood, congruent affect. Skin: No rash or erythema. Right hand: Well-healed carpal tunnel release scar.  Full range of motion actively and passively of all of her fingers but the fourth finger triggers in flexion at the A1 pulley.  She has a tender nodule there.  Imaging: No results found.  Assessment & Plan: 1.  Right fourth trigger finger -We discussed various options and she wants to try a dorsal splint combined with ice applications and Voltaren gel topically.  She will do this for 1 to 2 weeks.  If symptoms persist then we could try occupational/hand therapy, or trigger finger injection.     Procedures: No procedures performed  No notes on file      PMFS History: Patient Active Problem List   Diagnosis Date Noted  . Chronic vulvitis 04/20/2020  . Status post abdominoplasty 03/13/2020  . History of bariatric surgery 03/13/2020  . Mixed conductive and sensorineural hearing loss of both ears 10/24/2019  . Sensorineural hearing loss (SNHL), bilateral 10/24/2019  . Chronic pain disorder 09/10/2018  . Compression fracture of thoracic vertebra (HCC) 07/13/2018  . Panniculitis 06/18/2018  . Asthma, intermittent, uncomplicated [id number]  . GERD (gastroesophageal reflux disease) 03/02/2018  . Ductal carcinoma in situ (DCIS) of left breast 03/01/2018  . Degeneration of lumbar intervertebral disc 12/25/2017  . Spinal stenosis of lumbar region 12/25/2017  . Intertriginous candidiasis 10/02/2017  . Polyp of sigmoid colon   . Special screening for malignant neoplasms, colon   . Vitamin D deficiency 02/16/2017  . RLS (restless legs syndrome) 09/09/2016  . Insomnia 07/11/2016  . Anxiety and depression 07/11/2016  . Generalized osteoarthritis of multiple sites 07/11/2016  . Class 2 obesity with body mass index (BMI) of 36.0 to 36.9 in adult 01/28/2016  . Shoulder pain 12/28/2014  . Osteopenia 12/28/2014  . Hot flashes 08/02/2014  . Constipation 08/02/2014  . Malignant neoplasm of upper-outer quadrant of left breast in female, estrogen receptor positive ([place]) 03/24/2014  . CANDIDIASIS, SKIN  10/15/2009  . PURE HYPERCHOLESTEROLEMIA 04/25/2009  . Hyperlipidemia, acquired 02/16/2009  . Sleep apnea 02/16/2009  . Depression 06/18/2007  . Essential hypertension 06/18/2007   Past Medical History:  Diagnosis Date  . Arthritis    knees   . Asthma    rare;only when around alot of dust-Ventolin inhaler as needed  . Breast cancer ([place])     left. States she did not have lymph nodes removed  . Bursitis of right shoulder   . Cellulitis and abscess of leg   . Complication of anesthesia    was told 01/06/14 that airway was small  .  Depression    takes Effexor and Wellbutrin daily  . Hearing loss   . History of bronchitis 1966  . Hyperlipidemia   . Hypertension    takes Coreg and Losartan daily  . Joint pain   . Joint swelling   . Obese   . Peripheral edema    takes Furosemide daily as needed and Potassium daily  . Personal history of radiation therapy 2015  . Radiation 03/08/14-04/26/14   50.4 gray to left breast. Lumpectomy cavity boosted to 64.4 gray  . Restless legs syndrome    takes depakote  . Sleep apnea, obstructive    uses CPAP  . Wears glasses     Family History  Problem Relation Age of Onset  . Dementia Mother   . Heart attack Mother   . Esophageal cancer Father        also had stomach cancer  . Heart attack Father   . Asthma Other   . Hypertension Other   . Thyroid disease Other   . Heart attack Other   . Throat cancer Paternal Grandfather     Past Surgical History:  Procedure Laterality Date  . BREAST LUMPECTOMY Left 2015  . BREAST LUMPECTOMY WITH NEEDLE LOCALIZATION Left 01/24/2014   Procedure: BREAST LUMPECTOMY WITH NEEDLE LOCALIZATION;  Surgeon: Edward Jolly, MD;  Location: [facility];  Service: General;  Laterality: Left;  . COLONOSCOPY    . COLONOSCOPY N/A 03/20/2017   Procedure: COLONOSCOPY;  Surgeon: Mauri Pole, MD;  [facility];  Service: Endoscopy;  Laterality: N/A;  . DILATATION & CURETTAGE/HYSTEROSCOPY WITH TRUECLEAR N/A 01/06/2014   Procedure: DILATATION & CURETTAGE/HYSTEROSCOPY WITH TRUCLEAR;  Surgeon: Shon Millet II, MD;  Location: [facility];  Service: Gynecology;  Laterality: N/A;  . HYSTEROPLASTY  01/2014  . INNER EAR SURGERY Bilateral    for hearing loss  . JOINT REPLACEMENT Right    Knee  . LAPAROSCOPIC GASTRIC SLEEVE RESECTION N/A 01/28/2016   Procedure: LAPAROSCOPIC GASTRIC SLEEVE RESECTION WITH UPPER ENDO;  Surgeon: Excell Seltzer, MD;  [facility];  Service: General;  Laterality: N/A;  . LIPOSUCTION WITH LIPOFILLING Bilateral 06/20/2019    Procedure: BILATERAL THIGH LIPECTOMY;  Surgeon: Irene Limbo, MD;  Location: [facility];  Service: Plastics;  Laterality: Bilateral;  . PANNICULECTOMY N/A 06/18/2018   Procedure: PANNICULECTOMY;  Surgeon: Irene Limbo, MD;  Location: [facility];  Service: Plastics;  Laterality: N/A;  . RE-EXCISION OF BREAST LUMPECTOMY Left 02/02/2014   Procedure: RE-EXCISION OF LEFT BREAST LUMPECTOMY;  Surgeon: Edward Jolly, MD;  Location: [facility];  Service: General;  Laterality: Left;  . TUBAL LIGATION     Social History   Occupational History  . 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 Smoking status: Never Smoker  . Smokeless tobacco: Never Used  Substance and Sexual  Activity  . Alcohol use: No  . Drug use: No  . Sexual activity: Not Currently    Birth control/protection: Post-menopausal

## 2020-04-20 NOTE — Patient Instructions (Signed)
    Trigger finger:  Splint on finger most of the time for 1-2 weeks.  Frozen water bottle:  Apply ice to palm of hand for 5-10 minutes several times per day.  Voltaren gel:  Apply to palm of hand 3-4 times per day.   After 1-2 weeks if finger still catches, can either try therapy or a cortisone injection.

## 2020-04-23 ENCOUNTER — Inpatient Hospital Stay: Admission: RE | Admit: 2020-04-23 | Payer: Medicare Other | Source: Ambulatory Visit

## 2020-04-23 ENCOUNTER — Encounter: Payer: Self-pay | Admitting: Internal Medicine

## 2020-04-30 ENCOUNTER — Other Ambulatory Visit: Payer: Self-pay | Admitting: Internal Medicine

## 2020-04-30 DIAGNOSIS — I10 Essential (primary) hypertension: Secondary | ICD-10-CM

## 2020-04-30 DIAGNOSIS — R600 Localized edema: Secondary | ICD-10-CM

## 2020-04-30 DIAGNOSIS — K219 Gastro-esophageal reflux disease without esophagitis: Secondary | ICD-10-CM

## 2020-05-04 ENCOUNTER — Encounter: Payer: Self-pay | Admitting: Family Medicine

## 2020-05-04 ENCOUNTER — Ambulatory Visit (INDEPENDENT_AMBULATORY_CARE_PROVIDER_SITE_OTHER): Payer: Medicare Other | Admitting: Family Medicine

## 2020-05-04 ENCOUNTER — Other Ambulatory Visit: Payer: Self-pay

## 2020-05-04 DIAGNOSIS — M65341 Trigger finger, right ring finger: Secondary | ICD-10-CM | POA: Diagnosis not present

## 2020-05-04 NOTE — Progress Notes (Signed)
Office Visit Note   Patient: Tamara [name]           Date of Birth: 1950/12/16           MRN: [id number] Visit Date: 05/04/2020 Requested by: Isaac Bliss, Rayford Halsted, MD [place],  [place] [id number] PCP: Isaac Bliss, Rayford Halsted, MD  Subjective: Chief Complaint  Patient presents with  . Right Ring Finger - Pain    HPI: She is here with persistent right fourth trigger finger.  Splinting did not seem to help.              ROS:   All other systems were reviewed and are negative.  Objective: Vital Signs: There were no vitals taken for this visit.  Physical Exam:  General:  Alert and oriented, in no acute distress. Pulm:  Breathing unlabored. Psy:  Normal mood, congruent affect.  Right hand: The fourth finger has a tender nodule at the A1 pulley which triggers in flexion.  Imaging: No results found.  Assessment & Plan: 1.  Right fourth trigger finger -Injection today.  Follow-up as needed.     Procedures: Right fourth finger injection: After sterile prep with Betadine, injected 2 cc 1% lidocaine without epinephrine and 40 mg methylprednisolone into the region of the A1 pulley.    PMFS History: Patient Active Problem List   Diagnosis Date Noted  . Chronic vulvitis 04/20/2020  . Status post abdominoplasty 03/13/2020  . History of bariatric surgery 03/13/2020  . Mixed conductive and sensorineural hearing loss of both ears 10/24/2019  . Sensorineural hearing loss (SNHL), bilateral 10/24/2019  . Chronic pain disorder 09/10/2018  . Compression fracture of thoracic vertebra (HCC) 07/13/2018  . Panniculitis 06/18/2018  . Asthma, intermittent, uncomplicated 85/46/2703  . GERD (gastroesophageal reflux disease) 03/02/2018  . Ductal carcinoma in situ (DCIS) of left breast 03/01/2018  . Degeneration of lumbar intervertebral disc 12/25/2017  . Spinal stenosis of lumbar region 12/25/2017  . Intertriginous candidiasis 10/02/2017  . Polyp of  sigmoid colon   . Special screening for malignant neoplasms, colon   . Vitamin D deficiency 02/16/2017  . RLS (restless legs syndrome) 09/09/2016  . Insomnia 07/11/2016  . Anxiety and depression 07/11/2016  . Generalized osteoarthritis of multiple sites 07/11/2016  . Class 2 obesity with body mass index (BMI) of 36.0 to 36.9 in adult 01/28/2016  . Shoulder pain 12/28/2014  . Osteopenia 12/28/2014  . Hot flashes 08/02/2014  . Constipation 08/02/2014  . Malignant neoplasm of upper-outer quadrant of left breast in female, estrogen receptor positive ([place]) 03/24/2014  . CANDIDIASIS, SKIN 10/15/2009  . PURE HYPERCHOLESTEROLEMIA 04/25/2009  . Hyperlipidemia, acquired 02/16/2009  . Sleep apnea 02/16/2009  . Depression 06/18/2007  . Essential hypertension 06/18/2007   Past Medical History:  Diagnosis Date  . Arthritis    knees   . Asthma    rare;only when around alot of dust-Ventolin inhaler as needed  . Breast cancer ([place])     left. States she did not have lymph nodes removed  . Bursitis of right shoulder   . Cellulitis and abscess of leg   . Complication of anesthesia    was told 01/06/14 that airway was small  . Depression    takes Effexor and Wellbutrin daily  . Hearing loss   . History of bronchitis 1966  . Hyperlipidemia   . Hypertension    takes Coreg and Losartan daily  . Joint pain   . Joint swelling   . Obese   .  Peripheral edema    takes Furosemide daily as needed and Potassium daily  . Personal history of radiation therapy 2015  . Radiation 03/08/14-04/26/14   50.4 gray to left breast. Lumpectomy cavity boosted to 64.4 gray  . Restless legs syndrome    takes depakote  . Sleep apnea, obstructive    uses CPAP  . Wears glasses     Family History  Problem Relation Age of Onset  . Dementia Mother   . Heart attack Mother   . Esophageal cancer Father        also had stomach cancer  . Heart attack Father   . Asthma Other   . Hypertension Other   . Thyroid disease  Other   . Heart attack Other   . Throat cancer Paternal Grandfather     Past Surgical History:  Procedure Laterality Date  . BREAST LUMPECTOMY Left 2015  . BREAST LUMPECTOMY WITH NEEDLE LOCALIZATION Left 01/24/2014   Procedure: BREAST LUMPECTOMY WITH NEEDLE LOCALIZATION;  Surgeon: Edward Jolly, MD;  Location: [facility];  Service: General;  Laterality: Left;  . COLONOSCOPY    . COLONOSCOPY N/A 03/20/2017   Procedure: COLONOSCOPY;  Surgeon: Mauri Pole, MD;  [facility];  Service: Endoscopy;  Laterality: N/A;  . DILATATION & CURETTAGE/HYSTEROSCOPY WITH TRUECLEAR N/A 01/06/2014   Procedure: DILATATION & CURETTAGE/HYSTEROSCOPY WITH TRUCLEAR;  Surgeon: Shon Millet II, MD;  Location: [facility];  Service: Gynecology;  Laterality: N/A;  . HYSTEROPLASTY  01/2014  . INNER EAR SURGERY Bilateral    for hearing loss  . JOINT REPLACEMENT Right    Knee  . LAPAROSCOPIC GASTRIC SLEEVE RESECTION N/A 01/28/2016   Procedure: LAPAROSCOPIC GASTRIC SLEEVE RESECTION WITH UPPER ENDO;  Surgeon: Excell Seltzer, MD;  [facility];  Service: General;  Laterality: N/A;  . LIPOSUCTION WITH LIPOFILLING Bilateral 06/20/2019   Procedure: BILATERAL THIGH LIPECTOMY;  Surgeon: Irene Limbo, MD;  Location: [facility];  Service: Plastics;  Laterality: Bilateral;  . PANNICULECTOMY N/A 06/18/2018   Procedure: PANNICULECTOMY;  Surgeon: Irene Limbo, MD;  Location: [facility];  Service: Plastics;  Laterality: N/A;  . RE-EXCISION OF BREAST LUMPECTOMY Left 02/02/2014   Procedure: RE-EXCISION OF LEFT BREAST LUMPECTOMY;  Surgeon: Edward Jolly, MD;  Location: [facility];  Service: General;  Laterality: Left;  . TUBAL LIGATION     Social History   Occupational History  . 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 Smoking status: Never Smoker  . Smokeless tobacco: Never Used  Substance and Sexual Activity  . Alcohol use: No  . Drug use: No  .  Sexual activity: Not Currently    Birth control/protection: Post-menopausal

## 2020-05-29 ENCOUNTER — Telehealth: Payer: Self-pay | Admitting: *Deleted

## 2020-05-29 DIAGNOSIS — T884XXA Failed or difficult intubation, initial encounter: Secondary | ICD-10-CM | POA: Insufficient documentation

## 2020-05-29 NOTE — Telephone Encounter (Signed)
Yes. It will be in September.The schedule has not been released to me.

## 2020-05-29 NOTE — Telephone Encounter (Signed)
I will call her 05/30/20 and explain.  Cancelling the appointments.

## 2020-05-29 NOTE — Telephone Encounter (Signed)
She can be scheduled for direct procedure at [facility] endoscopy unit, next available appointment.  Thank you

## 2020-05-29 NOTE — Telephone Encounter (Signed)
Dr Silverio Decamp,  This pt is scheduled to Have a colon in the [facility] 06[facility]25-20 with you- her last procedure 03/2017 was done at [facility] due to a history of difficult intubation and a small airway -   She has a hx of colon polyps-  Do you want her to have an OV or can she be direct at [facility]?  Please advise, Thanks, Marijean Niemann

## 2020-05-29 NOTE — Telephone Encounter (Signed)
ok 

## 2020-05-29 NOTE — Telephone Encounter (Signed)
Beth,  Can you please schedule her at [facility] due to difficult intubation and small airway?  Thanks, Lelan Pons pV

## 2020-05-30 ENCOUNTER — Other Ambulatory Visit: Payer: Self-pay

## 2020-05-30 DIAGNOSIS — Z1211 Encounter for screening for malignant neoplasm of colon: Secondary | ICD-10-CM

## 2020-05-30 NOTE — Telephone Encounter (Signed)
Called the patient. No answer. Left her a message on the voicemail to return my call. She will be scheduled at [facility] on 08/21/20. She will need her nurse visit at least 1 week before. At this time, [facility] will require COVID testing the Friday before her Tuesday procedure date.

## 2020-05-31 ENCOUNTER — Telehealth: Payer: Self-pay | Admitting: Physician Assistant

## 2020-05-31 NOTE — Telephone Encounter (Signed)
Left message to call. Please schedule her nurse visit on a date at least one week prior to 08/21/20. Her procedure was moved to [facility]

## 2020-06-24 ENCOUNTER — Other Ambulatory Visit: Payer: Self-pay | Admitting: Oncology

## 2020-06-24 ENCOUNTER — Other Ambulatory Visit: Payer: Self-pay | Admitting: Internal Medicine

## 2020-06-24 ENCOUNTER — Other Ambulatory Visit: Payer: Self-pay | Admitting: Family Medicine

## 2020-06-24 DIAGNOSIS — E559 Vitamin D deficiency, unspecified: Secondary | ICD-10-CM

## 2020-06-25 ENCOUNTER — Encounter: Payer: Medicare Other | Admitting: Gastroenterology

## 2020-07-13 ENCOUNTER — Encounter: Payer: Self-pay | Admitting: Internal Medicine

## 2020-07-13 ENCOUNTER — Ambulatory Visit (INDEPENDENT_AMBULATORY_CARE_PROVIDER_SITE_OTHER): Payer: Medicare Other | Admitting: Internal Medicine

## 2020-07-13 ENCOUNTER — Other Ambulatory Visit: Payer: Self-pay

## 2020-07-13 VITALS — BP 160/80 | HR 52 | Temp 98.5°F | Wt 231.5 lb

## 2020-07-13 DIAGNOSIS — G894 Chronic pain syndrome: Secondary | ICD-10-CM | POA: Diagnosis not present

## 2020-07-13 DIAGNOSIS — I10 Essential (primary) hypertension: Secondary | ICD-10-CM

## 2020-07-13 DIAGNOSIS — M159 Polyosteoarthritis, unspecified: Secondary | ICD-10-CM | POA: Diagnosis not present

## 2020-07-13 MED ORDER — HYDROCODONE-ACETAMINOPHEN 5-325 MG PO TABS: 1.0000 | ORAL_TABLET | Freq: Four times a day (QID) | ORAL | 0 refills | Status: DC | PRN

## 2020-07-13 NOTE — Patient Instructions (Signed)
-  Nice seeing you today!!  -Check BP at home 2-3 times a week and bring numbers into your next visit.  -Schedule follow up in 3 months.

## 2020-07-13 NOTE — Progress Notes (Signed)
Established Patient Office Visit     This visit occurred during the SARS-CoV-2 public health emergency.  Safety protocols were in place, including screening questions prior to the visit, additional usage of staff PPE, and extensive cleaning of exam room while observing appropriate contact time as indicated for disinfecting solutions.    CC/Reason for Visit: 68-month follow-up chronic conditions  HPI: Tamara [name] is a 69 y.o. female who is coming in today for the above mentioned reasons. Past Medical History is significant for: Morbid obesity, hypertension, GERD, hyperlipidemia, vitamin D deficiency, obstructive sleep apnea on CPAP, history of breast cancer followed by Dr. Jana Hakim.  She was recently referred to orthopedics for trigger finger, received an injection and has improved.  She is here today for hydrocodone refills per pain contract.  She feels that she has been doing well.   Past Medical/Surgical History: Past Medical History:  Diagnosis Date  . Arthritis    knees   . Asthma    rare;only when around alot of dust-Ventolin inhaler as needed  . Breast cancer ([place])     left. States she did not have lymph nodes removed  . Bursitis of right shoulder   . Cellulitis and abscess of leg   . Complication of anesthesia    was told 01/06/14 that airway was small  . Depression    takes Effexor and Wellbutrin daily  . Hearing loss   . History of bronchitis 1966  . Hyperlipidemia   . Hypertension    takes Coreg and Losartan daily  . Joint pain   . Joint swelling   . Obese   . Peripheral edema    takes Furosemide daily as needed and Potassium daily  . Personal history of radiation therapy 2015  . Radiation 03/08/14-04/26/14   50.4 gray to left breast. Lumpectomy cavity boosted to 64.4 gray  . Restless legs syndrome    takes depakote  . Sleep apnea, obstructive    uses CPAP  . Wears glasses     Past Surgical History:  Procedure Laterality Date  . BREAST LUMPECTOMY  Left 2015  . BREAST LUMPECTOMY WITH NEEDLE LOCALIZATION Left 01/24/2014   Procedure: BREAST LUMPECTOMY WITH NEEDLE LOCALIZATION;  Surgeon: Edward Jolly, MD;  Location: [facility];  Service: General;  Laterality: Left;  . COLONOSCOPY    . COLONOSCOPY N/A 03/20/2017   Procedure: COLONOSCOPY;  Surgeon: Mauri Pole, MD;  [facility];  Service: Endoscopy;  Laterality: N/A;  . DILATATION & CURETTAGE/HYSTEROSCOPY WITH TRUECLEAR N/A 01/06/2014   Procedure: DILATATION & CURETTAGE/HYSTEROSCOPY WITH TRUCLEAR;  Surgeon: Shon Millet II, MD;  Location: [facility];  Service: Gynecology;  Laterality: N/A;  . HYSTEROPLASTY  01/2014  . INNER EAR SURGERY Bilateral    for hearing loss  . JOINT REPLACEMENT Right    Knee  . LAPAROSCOPIC GASTRIC SLEEVE RESECTION N/A 01/28/2016   Procedure: LAPAROSCOPIC GASTRIC SLEEVE RESECTION WITH UPPER ENDO;  Surgeon: Excell Seltzer, MD;  [facility];  Service: General;  Laterality: N/A;  . LIPOSUCTION WITH LIPOFILLING Bilateral 06/20/2019   Procedure: BILATERAL THIGH LIPECTOMY;  Surgeon: Irene Limbo, MD;  Location: [facility];  Service: Plastics;  Laterality: Bilateral;  . PANNICULECTOMY N/A 06/18/2018   Procedure: PANNICULECTOMY;  Surgeon: Irene Limbo, MD;  Location: [facility];  Service: Plastics;  Laterality: N/A;  . RE-EXCISION OF BREAST LUMPECTOMY Left 02/02/2014   Procedure: RE-EXCISION OF LEFT BREAST LUMPECTOMY;  Surgeon: Edward Jolly, MD;  Location: [facility];  Service:  General;  Laterality: Left;  . TUBAL LIGATION      Social History:  reports that she has never smoked. She has never used smokeless tobacco. She reports that she does not drink alcohol and does not use drugs.  Allergies: No Known Allergies  Family History:  Family History  Problem Relation Age of Onset  . Dementia Mother   . Heart attack Mother   . Esophageal cancer Father        also had stomach cancer  . Heart attack Father   . Asthma Other   .  Hypertension Other   . Thyroid disease Other   . Heart attack Other   . Throat cancer Paternal Grandfather      Current Outpatient Medications:  .  albuterol (VENTOLIN HFA) 108 (90 Base) MCG/ACT inhaler, Inhale 2 puffs into the lungs every 6 (six) hours as needed for wheezing or shortness of breath., Disp: 36 each, Rfl: 2 .  amLODipine (NORVASC) 2.5 MG tablet, TAKE 1 TABLET BY MOUTH EVERY DAY, Disp: 90 tablet, Rfl: 1 .  aspirin EC 81 MG tablet, Take 1 tablet (81 mg total) by mouth at bedtime., Disp: 90 tablet, Rfl: 2 .  atorvastatin (LIPITOR) 20 MG tablet, Take 20 mg by mouth daily., Disp: , Rfl:  .  buPROPion (WELLBUTRIN SR) 150 MG 12 hr tablet, Take 1 tablet (150 mg total) by mouth daily., Disp: 90 tablet, Rfl: 1 .  carvedilol (COREG) 12.5 MG tablet, TAKE 1 TABLET BY MOUTH 2 (TWO) TIMES DAILY WITH A MEAL., Disp: 180 tablet, Rfl: 1 .  Cholecalciferol (VITAMIN D3) 125 MCG (5000 UT) TABS, TAKE 1 TABLET BY MOUTH DAILY, Disp: 30 tablet, Rfl: 8 .  clindamycin (CLINDAGEL) 1 % gel, Apply topically 2 (two) times daily. (Patient taking differently: Apply 1 application topically 2 (two) times daily as needed (rash). ), Disp: 30 g, Rfl: 0 .  diclofenac Sodium (VOLTAREN) 1 % GEL, Apply 4 g topically 4 (four) times daily as needed., Disp: 500 g, Rfl: 6 .  flintstones complete (FLINTSTONES) 60 MG chewable tablet, Chew 1 tablet by mouth daily., Disp: , Rfl:  .  furosemide (LASIX) 40 MG tablet, TAKE 1/2 TABLET BY MOUTH ONCE DAILY AS NEEDED FOR EDEMA, Disp: 45 tablet, Rfl: 2 .  HYDROcodone-acetaminophen (NORCO/VICODIN) 5-325 MG tablet, Take 1 tablet by mouth every 6 (six) hours as needed for moderate pain., Disp: 60 tablet, Rfl: 0 .  ibuprofen (ADVIL) 200 MG tablet, Take 400 mg by mouth every 6 (six) hours as needed for headache or moderate pain., Disp: , Rfl:  .  linaclotide (LINZESS) 290 MCG CAPS capsule, Please specify directions, refills and quantity, Disp: 30 capsule, Rfl: 2 .  losartan (COZAAR) 100 MG  tablet, TAKE 1 TABLET BY MOUTH ONCE DAILY, Disp: 90 tablet, Rfl: 1 .  meclizine (ANTIVERT) 25 MG tablet, TAKE 1 TABLET BY MOUTH DAILY AS NEEDED FOR DIZZINESS, Disp: 30 tablet, Rfl: 2 .  nystatin cream (MYCOSTATIN), Apply 1 application topically daily as needed (rash). , Disp: , Rfl:  .  pantoprazole (PROTONIX) 40 MG tablet, TAKE 1 TABLET BY MOUTH EVERY DAY, Disp: 90 tablet, Rfl: 1 .  traZODone (DESYREL) 50 MG tablet, Take 1 tablet (50 mg total) by mouth at bedtime., Disp: 90 tablet, Rfl: 1 .  triamcinolone cream (KENALOG) 0.1 %, Apply 1 application topically 2 (two) times daily as needed for rash., Disp: , Rfl:  .  venlafaxine XR (EFFEXOR-XR) 150 MG 24 hr capsule, TAKE 1 CAPSULE BY MOUTH EVERY DAY  WITH BREAKFAST, Disp: 30 capsule, Rfl: 2 .  HYDROcodone-acetaminophen (NORCO/VICODIN) 5-325 MG tablet, Take 1 tablet by mouth every 6 (six) hours as needed for moderate pain., Disp: 60 tablet, Rfl: 0 .  HYDROcodone-acetaminophen (NORCO/VICODIN) 5-325 MG tablet, Take 1 tablet by mouth every 6 (six) hours as needed for moderate pain., Disp: 60 tablet, Rfl: 0  Review of Systems:  Constitutional: Denies fever, chills, diaphoresis, appetite change and fatigue.  HEENT: Denies photophobia, eye pain, redness, hearing loss, ear pain, congestion, sore throat, rhinorrhea, sneezing, mouth sores, trouble swallowing, neck pain, neck stiffness and tinnitus.   Respiratory: Denies SOB, DOE, cough, chest tightness,  and wheezing.   Cardiovascular: Denies chest pain, palpitations and leg swelling.  Gastrointestinal: Denies nausea, vomiting, abdominal pain, diarrhea, constipation, blood in stool and abdominal distention.  Genitourinary: Denies dysuria, urgency, frequency, hematuria, flank pain and difficulty urinating.  Endocrine: Denies: hot or cold intolerance, sweats, changes in hair or nails, polyuria, polydipsia. Musculoskeletal: Denies myalgias, back pain, joint swelling, arthralgias and gait problem.  Skin: Denies  pallor, rash and wound.  Neurological: Denies dizziness, seizures, syncope, weakness, light-headedness, numbness and headaches.  Hematological: Denies adenopathy. Easy bruising, personal or family bleeding history  Psychiatric/Behavioral: Denies suicidal ideation, mood changes, confusion, nervousness, sleep disturbance and agitation    Physical Exam: Vitals:   07/13/20 0755  BP: (!) 160/80  Pulse: (!) 52  Temp: 98.5 F (36.9 C)  TempSrc: Oral  SpO2: 98%  Weight: 231 lb 8 oz (105 kg)    Body mass index is 41.01 kg/m.   Constitutional: NAD, calm, comfortable, obese Eyes: PERRL, lids and conjunctivae normal, wears corrective lenses ENMT: Mucous membranes are moist.  Respiratory: clear to auscultation bilaterally, no wheezing, no crackles. Normal respiratory effort. No accessory muscle use.  Cardiovascular: Regular rate and rhythm, no murmurs / rubs / gallops. No extremity edema.  Neurologic: Grossly intact and nonfocal Psychiatric: Normal judgment and insight. Alert and oriented x 3. Normal mood.    Impression and Plan:  Generalized osteoarthritis of multiple sites  Chronic pain disorder  -PDMP reviewed, no red flags, overdose risk score 170. -Refill hydrocodone 5/325 mg to take every 6 hours x3 months limited to 60 tablets a month.  Morbid obesity ([place]) -Discussed healthy lifestyle, including increased physical activity and better food choices to promote weight loss.   Essential hypertension -Blood pressure is elevated today. -Have advised ambulatory blood pressure monitoring and return in 3 months for follow-up.    Patient Instructions  -Nice seeing you today!!  -Check BP at home 2-3 times a week and bring numbers into your next visit.  -Schedule follow up in 3 months.     Lelon Frohlich, MD [facility]

## 2020-07-26 ENCOUNTER — Other Ambulatory Visit: Payer: Self-pay | Admitting: Family

## 2020-07-30 ENCOUNTER — Telehealth: Payer: Self-pay | Admitting: Gastroenterology

## 2020-07-30 NOTE — Telephone Encounter (Signed)
Pt has a PV 9-7 at 8 am- she states she will be out of town and not returning until late 9-7- she has no phone where she will be and wants to RS her PV-   RS to 9-3 Friday at 8 am and pt will come into PV for her visit  [facility] colon 9-21 Tuesday

## 2020-07-30 NOTE — Telephone Encounter (Signed)
Pt would like to know if the pre visit nurse could call her on 9/7 @11am . Explained to the pt that the appt is at 8am,Pt insisted on a call back

## 2020-07-30 NOTE — Telephone Encounter (Signed)
Pt is requesting a call back. 

## 2020-08-03 ENCOUNTER — Ambulatory Visit (AMBULATORY_SURGERY_CENTER): Payer: Self-pay

## 2020-08-03 ENCOUNTER — Other Ambulatory Visit: Payer: Self-pay

## 2020-08-03 VITALS — Ht 63.0 in | Wt 231.0 lb

## 2020-08-03 DIAGNOSIS — Z01818 Encounter for other preprocedural examination: Secondary | ICD-10-CM

## 2020-08-03 DIAGNOSIS — Z86010 Personal history of colonic polyps: Secondary | ICD-10-CM

## 2020-08-03 MED ORDER — SUTAB 1479-225-188 MG PO TABS: 1.0000 | ORAL_TABLET | ORAL | 0 refills | Status: DC

## 2020-08-03 NOTE — Progress Notes (Signed)
No egg or soy allergy known to patient  No issues with past sedation with any surgeries or procedures  Patient reports intubation problems in the past --- reports she was told she has " a small pipe"- small airway  No FH of Malignant Hyperthermia No diet pills per patient No home 02 use per patient  No blood thinners per patient  Pt reports issues with constipation - 2 day prep being given to patient for procedure No A fib or A flutter  EMMI video via MyChart  COVID 19 guidelines implemented in [place] today with Pt and RN  Coupon given to pt in PV today , Code to Pharmacy  COVID vaccines completed on 05/2020 per pt;  Due to the COVID-19 pandemic we are asking patients to follow these guidelines. Please only bring one care partner. Please be aware that your care partner may wait in the car in the parking lot or if they feel like they will be too hot to wait in the car, they may wait in the lobby on the 4th floor. All care partners are required to wear a mask the entire time (we do not have any that we can provide them), they need to practice social distancing, and we will do a Covid check for all patient's and care partners when you arrive. Also we will check their temperature and your temperature. If the care partner waits in their car they need to stay in the parking lot the entire time and we will call them on their cell phone when the patient is ready for discharge so they can bring the car to the front of the building. Also all patient's will need to wear a mask into building.

## 2020-08-05 ENCOUNTER — Other Ambulatory Visit: Payer: Self-pay | Admitting: Internal Medicine

## 2020-08-05 DIAGNOSIS — K219 Gastro-esophageal reflux disease without esophagitis: Secondary | ICD-10-CM

## 2020-08-13 ENCOUNTER — Telehealth: Payer: Self-pay | Admitting: Internal Medicine

## 2020-08-13 NOTE — Telephone Encounter (Signed)
Raquel Sarna with [facility] is calling to get a refill for Rx atorvastatin (LIPITOR) 20 MG   Pharm[facility]

## 2020-08-14 MED ORDER — ATORVASTATIN CALCIUM 20 MG PO TABS
20.0000 mg | ORAL_TABLET | Freq: Every day | ORAL | 3 refills | Status: DC
Start: 2020-08-14 — End: 2020-08-15

## 2020-08-14 NOTE — Telephone Encounter (Signed)
Refill sent.

## 2020-08-14 NOTE — Telephone Encounter (Signed)
Ok to refill 

## 2020-08-15 ENCOUNTER — Encounter: Payer: Self-pay | Admitting: Internal Medicine

## 2020-08-15 MED ORDER — ATORVASTATIN CALCIUM 20 MG PO TABS
20.0000 mg | ORAL_TABLET | Freq: Every day | ORAL | 3 refills | Status: DC
Start: 2020-08-15 — End: 2021-07-24

## 2020-08-17 ENCOUNTER — Ambulatory Visit (INDEPENDENT_AMBULATORY_CARE_PROVIDER_SITE_OTHER): Payer: Medicare Other

## 2020-08-17 ENCOUNTER — Other Ambulatory Visit (HOSPITAL_COMMUNITY): Payer: Medicare Other

## 2020-08-17 ENCOUNTER — Other Ambulatory Visit: Payer: Self-pay | Admitting: Gastroenterology

## 2020-08-17 DIAGNOSIS — Z1159 Encounter for screening for other viral diseases: Secondary | ICD-10-CM

## 2020-08-17 LAB — SARS CORONAVIRUS 2 (TAT 6-24 HRS): SARS Coronavirus 2: NEGATIVE

## 2020-08-20 NOTE — Anesthesia Preprocedure Evaluation (Addendum)
Anesthesia Evaluation  Patient identified by MRN, date of birth, ID band Patient awake    Reviewed: Allergy & Precautions, NPO status , Patient's Chart, lab work & pertinent test results  History of Anesthesia Complications Negative for: history of anesthetic complications  Airway Mallampati: II  TM Distance: >3 FB Neck ROM: Full    Dental  (+) Upper Dentures, Lower Dentures   Pulmonary neg shortness of breath, asthma , sleep apnea and Continuous Positive Airway Pressure Ventilation , neg recent URI,    breath sounds clear to auscultation       Cardiovascular hypertension, Pt. on medications and Pt. on home beta blockers (-) angina(-) Past MI and (-) CHF  Rhythm:Regular Rate:Normal     Neuro/Psych PSYCHIATRIC DISORDERS Anxiety Depression negative neurological ROS     GI/Hepatic Neg liver ROS, GERD  Medicated,  Endo/Other  neg diabetesMorbid obesity  Renal/GU negative Renal ROS     Musculoskeletal  (+) Arthritis ,   Abdominal (+) + obese,   Peds  Hematology negative hematology ROS (+)   Anesthesia Other Findings   Reproductive/Obstetrics                            Anesthesia Physical  Anesthesia Plan  ASA: III  Anesthesia Plan: MAC   Post-op Pain Management:    Induction: Intravenous  PONV Risk Score and Plan: 2 and Ondansetron, Propofol infusion, Treatment may vary due to age or medical condition and TIVA  Airway Management Planned: Natural Airway  Additional Equipment: None  Intra-op Plan:   Post-operative Plan:   Informed Consent: I have reviewed the patients History and Physical, chart, labs and discussed the procedure including the risks, benefits and alternatives for the proposed anesthesia with the patient or authorized representative who has indicated his/her understanding and acceptance.     Dental advisory given  Plan Discussed with: CRNA  Anesthesia Plan  Comments:        Anesthesia Quick Evaluation

## 2020-08-21 ENCOUNTER — Ambulatory Visit (HOSPITAL_COMMUNITY): Payer: Medicare Other | Admitting: Anesthesiology

## 2020-08-21 ENCOUNTER — Encounter (HOSPITAL_COMMUNITY)
Admission: RE | Disposition: A | Discharge status: Home / Self Care | Payer: Self-pay | Source: Home / Self Care | Attending: Gastroenterology

## 2020-08-21 ENCOUNTER — Ambulatory Visit (HOSPITAL_COMMUNITY)
Admission: RE | Admit: 2020-08-21 | Discharge: 2020-08-21 | Disposition: A | Discharge status: Home / Self Care | Payer: Medicare Other | Attending: Gastroenterology | Admitting: Gastroenterology

## 2020-08-21 ENCOUNTER — Encounter (HOSPITAL_COMMUNITY): Payer: Self-pay | Admitting: Gastroenterology

## 2020-08-21 ENCOUNTER — Other Ambulatory Visit: Payer: Self-pay

## 2020-08-21 DIAGNOSIS — G2581 Restless legs syndrome: Secondary | ICD-10-CM | POA: Diagnosis not present

## 2020-08-21 DIAGNOSIS — F419 Anxiety disorder, unspecified: Secondary | ICD-10-CM | POA: Insufficient documentation

## 2020-08-21 DIAGNOSIS — K648 Other hemorrhoids: Secondary | ICD-10-CM | POA: Diagnosis not present

## 2020-08-21 DIAGNOSIS — Z860100 Personal history of colon polyps, unspecified: Secondary | ICD-10-CM

## 2020-08-21 DIAGNOSIS — K621 Rectal polyp: Secondary | ICD-10-CM | POA: Diagnosis not present

## 2020-08-21 DIAGNOSIS — J45909 Unspecified asthma, uncomplicated: Secondary | ICD-10-CM | POA: Diagnosis not present

## 2020-08-21 DIAGNOSIS — D123 Benign neoplasm of transverse colon: Secondary | ICD-10-CM | POA: Diagnosis not present

## 2020-08-21 DIAGNOSIS — E559 Vitamin D deficiency, unspecified: Secondary | ICD-10-CM | POA: Diagnosis not present

## 2020-08-21 DIAGNOSIS — Z79899 Other long term (current) drug therapy: Secondary | ICD-10-CM | POA: Diagnosis not present

## 2020-08-21 DIAGNOSIS — H9190 Unspecified hearing loss, unspecified ear: Secondary | ICD-10-CM | POA: Insufficient documentation

## 2020-08-21 DIAGNOSIS — Z923 Personal history of irradiation: Secondary | ICD-10-CM | POA: Insufficient documentation

## 2020-08-21 DIAGNOSIS — R609 Edema, unspecified: Secondary | ICD-10-CM | POA: Diagnosis not present

## 2020-08-21 DIAGNOSIS — Z6841 Body Mass Index (BMI) 40.0 and over, adult: Secondary | ICD-10-CM | POA: Insufficient documentation

## 2020-08-21 DIAGNOSIS — Z09 Encounter for follow-up examination after completed treatment for conditions other than malignant neoplasm: Secondary | ICD-10-CM | POA: Diagnosis present

## 2020-08-21 DIAGNOSIS — Z853 Personal history of malignant neoplasm of breast: Secondary | ICD-10-CM | POA: Insufficient documentation

## 2020-08-21 DIAGNOSIS — M48061 Spinal stenosis, lumbar region without neurogenic claudication: Secondary | ICD-10-CM | POA: Diagnosis not present

## 2020-08-21 DIAGNOSIS — Z7982 Long term (current) use of aspirin: Secondary | ICD-10-CM | POA: Insufficient documentation

## 2020-08-21 DIAGNOSIS — E669 Obesity, unspecified: Secondary | ICD-10-CM | POA: Diagnosis not present

## 2020-08-21 DIAGNOSIS — G4733 Obstructive sleep apnea (adult) (pediatric): Secondary | ICD-10-CM | POA: Diagnosis not present

## 2020-08-21 DIAGNOSIS — Z1211 Encounter for screening for malignant neoplasm of colon: Secondary | ICD-10-CM

## 2020-08-21 DIAGNOSIS — F329 Major depressive disorder, single episode, unspecified: Secondary | ICD-10-CM | POA: Diagnosis not present

## 2020-08-21 DIAGNOSIS — D128 Benign neoplasm of rectum: Secondary | ICD-10-CM | POA: Insufficient documentation

## 2020-08-21 DIAGNOSIS — D127 Benign neoplasm of rectosigmoid junction: Secondary | ICD-10-CM | POA: Diagnosis not present

## 2020-08-21 DIAGNOSIS — K219 Gastro-esophageal reflux disease without esophagitis: Secondary | ICD-10-CM | POA: Diagnosis not present

## 2020-08-21 DIAGNOSIS — K635 Polyp of colon: Secondary | ICD-10-CM

## 2020-08-21 DIAGNOSIS — E785 Hyperlipidemia, unspecified: Secondary | ICD-10-CM | POA: Diagnosis not present

## 2020-08-21 DIAGNOSIS — I10 Essential (primary) hypertension: Secondary | ICD-10-CM | POA: Diagnosis not present

## 2020-08-21 HISTORY — PX: COLONOSCOPY WITH PROPOFOL: SHX5780

## 2020-08-21 HISTORY — PX: POLYPECTOMY: SHX5525

## 2020-08-21 SURGERY — COLONOSCOPY WITH PROPOFOL
Anesthesia: Monitor Anesthesia Care

## 2020-08-21 MED ORDER — LACTATED RINGERS IV SOLN
INTRAVENOUS | Status: DC
  Administered 2020-08-21: 1000 mL via INTRAVENOUS

## 2020-08-21 MED ORDER — PROPOFOL 500 MG/50ML IV EMUL
INTRAVENOUS | Status: AC
  Filled 2020-08-21: qty 50

## 2020-08-21 MED ORDER — HYDRALAZINE HCL 20 MG/ML IJ SOLN
INTRAMUSCULAR | Status: AC
  Filled 2020-08-21: qty 1

## 2020-08-21 MED ORDER — PROPOFOL 1000 MG/100ML IV EMUL
INTRAVENOUS | Status: AC
  Filled 2020-08-21: qty 200

## 2020-08-21 MED ORDER — PROPOFOL 10 MG/ML IV BOLUS
INTRAVENOUS | Status: DC | PRN
  Administered 2020-08-21: 40 mg via INTRAVENOUS

## 2020-08-21 MED ORDER — SODIUM CHLORIDE 0.9 % IV SOLN: INTRAVENOUS | Status: DC

## 2020-08-21 MED ORDER — PROPOFOL 500 MG/50ML IV EMUL
INTRAVENOUS | Status: DC | PRN
  Administered 2020-08-21: 125 ug/kg/min via INTRAVENOUS

## 2020-08-21 MED ORDER — HYDRALAZINE HCL 20 MG/ML IJ SOLN
5.0000 mg | Freq: Once | INTRAMUSCULAR | Status: AC
  Administered 2020-08-21: 5 mg via INTRAVENOUS

## 2020-08-21 MED ORDER — HYDRALAZINE HCL 20 MG/ML IJ SOLN
10.0000 mg | Freq: Once | INTRAMUSCULAR | Status: AC
  Administered 2020-08-21: 10 mg via INTRAVENOUS

## 2020-08-21 SURGICAL SUPPLY — 21 items

## 2020-08-21 NOTE — Progress Notes (Signed)
Pt blood pressure 197/60. On admission was 197/85. Pt states she did not take her blood pressure medicine this morning. Dr. Lissa Hoard made aware of above. Order from Dr. Lissa Hoard to give 5mg  of Hydralazine IV once and to tell patient to take her home meds when she gets home. Will continue to assess. Patient states she will take medicine when she gets home.

## 2020-08-21 NOTE — Transfer of Care (Signed)
Immediate Anesthesia Transfer of Care Note  Patient: Toini Failla  Procedure(s) Performed: COLONOSCOPY WITH PROPOFOL (N/A ) POLYPECTOMY  Patient Location: Endoscopy Unit  Anesthesia Type:MAC  Level of Consciousness: drowsy, patient cooperative and responds to stimulation  Airway & Oxygen Therapy: Patient Spontanous Breathing and Patient connected to face mask oxygen  Post-op Assessment: Report given to RN and Post -op Vital signs reviewed and stable  Post vital signs: Reviewed and stable  Last Vitals:  Vitals Value Taken Time  BP    Temp    Pulse 48 08/21/20 0908  Resp 15 08/21/20 0908  SpO2 99 % 08/21/20 0908  Vitals shown include unvalidated device data.  Last Pain:  Vitals:   08/21/20 0722  TempSrc: Oral         Complications: No complications documented.

## 2020-08-21 NOTE — Op Note (Addendum)
[facility] Patient Name: Tanisia [name] Procedure Date: 08/21/2020 MRN: [id number] Attending MD: Mauri Pole , MD Date of Birth: 1950-12-13 CSN: [id number] Age: 69 Admit Type: Outpatient Procedure:                Colonoscopy Indications:              High risk colon cancer surveillance: Personal                            history of colonic polyps, High risk colon cancer                            surveillance: Personal history of adenoma (10 mm or                            greater in size) Providers:                Mauri Pole, MD, Doristine Johns, RN, Tyna Jaksch Technician, Stephanie [place] CRNA Referring MD:              Medicines:                Monitored Anesthesia Care Complications:            No immediate complications. Estimated Blood Loss:     Estimated blood loss was minimal. Procedure:                Pre-Anesthesia Assessment:                           - Prior to the procedure, a History and Physical                            was performed, and patient medications and                            allergies were reviewed. The patient's tolerance of                            previous anesthesia was also reviewed. The risks                            and benefits of the procedure and the sedation                            options and risks were discussed with the patient.                            All questions were answered, and informed consent                            was obtained. Prior Anticoagulants: The patient has  taken no previous anticoagulant or antiplatelet                            agents. ASA Grade Assessment: II - A patient with                            mild systemic disease. After reviewing the risks                            and benefits, the patient was deemed in                            satisfactory condition to undergo the procedure.                            After obtaining informed consent, the colonoscope                            was passed under direct vision. Throughout the                            procedure, the patient's blood pressure, pulse, and                            oxygen saturations were monitored continuously. The                            PCF-H190DL ([id number] Olympus pediatric colonscope                            was introduced through the anus and advanced to the                            the cecum, identified by appendiceal orifice and                            ileocecal valve. The colonoscopy was performed                            without difficulty. The patient tolerated the                            procedure well. The quality of the bowel                            preparation was excellent. The ileocecal valve,                            appendiceal orifice, and rectum were photographed. Scope In: 8:39:27 AM Scope Out: 9:02:03 AM Scope Withdrawal Time: 0 hours 14 minutes 47 seconds  Total Procedure Duration: 0 hours 22 minutes 36 seconds  Findings:      The perianal and digital rectal examinations were normal.      Two sessile polyps were found in the rectum  and transverse colon. The       polyps were 4 to 5 mm in size. These polyps were removed with a cold       snare. Resection and retrieval were complete.      Non-bleeding internal hemorrhoids were found during retroflexion. The       hemorrhoids were small. Impression:               - Two 4 to 5 mm polyps in the rectum and in the                            transverse colon, removed with a cold snare.                            Resected and retrieved.                           - Non-bleeding internal hemorrhoids. Moderate Sedation:      N/A Recommendation:           - Patient has a contact number available for                            emergencies. The signs and symptoms of potential                            delayed complications were  discussed with the                            patient. Return to normal activities tomorrow.                            Written discharge instructions were provided to the                            patient.                           - Resume previous diet.                           - Continue present medications.                           - Await pathology results.                           - Repeat colonoscopy in 5 years for surveillance                            based on pathology results. Procedure Code(s):        --- Professional ---                           [phone], Colonoscopy, flexible; with removal of                            tumor(s), polyp(s), or  other lesion(s) by snare                            technique Diagnosis Code(s):        --- Professional ---                           K62.1, Rectal polyp                           K63.5, Polyp of colon                           K64.8, Other hemorrhoids                           Z86.010, Personal history of colonic polyps CPT copyright 2019 American Medical Association. All rights reserved. The codes documented in this report are preliminary and upon coder review may  be revised to meet current compliance requirements. Mauri Pole, MD 08/21/2020 9:29:11 AM This report has been signed electronically. Number of Addenda: 0

## 2020-08-21 NOTE — H&P (Signed)
[facility] History and Physical   Primary Care Physician:  Isaac Bliss, Rayford Halsted, MD   Reason for Procedure:  H/o advanced adenomatous polyps Plan:    Colonoscopy with possible intervention     HPI: Tamara [name] is a 69 y.o. female here for surveillance colonoscopy due to h/o large adenomatous colon polyp >1cm in size Denies any nausea, vomiting, abdominal pain, melena or bright red blood per rectum    Past Medical History:  Diagnosis Date  . Allergy    seasonal allergies ([place])  . Anxiety    on meds  . Arthritis    knees   . Asthma    rare;only when around alot of dust-Ventolin inhaler as needed  . Breast cancer ([place])     left. States she did not have lymph nodes removed  . Bursitis of right shoulder   . Cataract    not had sx as of yet  . Cellulitis and abscess of leg   . Complication of anesthesia    was told 01/06/14 that airway was small  . Depression    takes Effexor and Wellbutrin daily  . GERD (gastroesophageal reflux disease)    on meds  . Hearing loss   . History of bronchitis 1966  . Hyperlipidemia    on meds  . Hypertension    takes Coreg and Losartan daily  . Joint pain   . Joint swelling   . Neuromuscular disorder (HCC)    RLS  . Obese   . Peripheral edema    takes Furosemide daily as needed and Potassium daily  . Personal history of radiation therapy 2015  . Radiation 03/08/14-04/26/14   50.4 gray to left breast. Lumpectomy cavity boosted to 64.4 gray  . Restless legs syndrome    takes depakote  . Sleep apnea    uses CPAP  . Sleep apnea, obstructive    uses CPAP  . Wears glasses     Past Surgical History:  Procedure Laterality Date  . BREAST LUMPECTOMY Left 2015  . BREAST LUMPECTOMY WITH NEEDLE LOCALIZATION Left 01/24/2014   Procedure: BREAST LUMPECTOMY WITH NEEDLE LOCALIZATION;  Surgeon: Edward Jolly, MD;  Location: [place];  Service: General;  Laterality: Left;  . COLONOSCOPY N/A 03/20/2017   Procedure:  COLONOSCOPY;  Surgeon: Mauri Pole, MD;  Location: [facility];  Service: Endoscopy;  Laterality: N/A;  . COLONOSCOPY  2018  . DILATATION & CURETTAGE/HYSTEROSCOPY WITH TRUECLEAR N/A 01/06/2014   Procedure: DILATATION & CURETTAGE/HYSTEROSCOPY WITH TRUCLEAR;  Surgeon: Shon Millet II, MD;  Location: [facility];  Service: Gynecology;  Laterality: N/A;  . HYSTEROPLASTY  01/2014  . INNER EAR SURGERY Bilateral    for hearing loss  . JOINT REPLACEMENT Right    Knee  . LAPAROSCOPIC GASTRIC SLEEVE RESECTION N/A 01/28/2016   Procedure: LAPAROSCOPIC GASTRIC SLEEVE RESECTION WITH UPPER ENDO;  Surgeon: Excell Seltzer, MD;  Location: [facility];  Service: General;  Laterality: N/A;  . LIPOSUCTION WITH LIPOFILLING Bilateral 06/20/2019   Procedure: BILATERAL THIGH LIPECTOMY;  Surgeon: Irene Limbo, MD;  Location: [place];  Service: Plastics;  Laterality: Bilateral;  . PANNICULECTOMY N/A 06/18/2018   Procedure: PANNICULECTOMY;  Surgeon: Irene Limbo, MD;  Location: [place];  Service: Plastics;  Laterality: N/A;  . RE-EXCISION OF BREAST LUMPECTOMY Left 02/02/2014   Procedure: RE-EXCISION OF LEFT BREAST LUMPECTOMY;  Surgeon: Edward Jolly, MD;  Location: [place];  Service: General;  Laterality: Left;  . TUBAL LIGATION    . WISDOM  TOOTH EXTRACTION  1970    Prior to Admission medications   Medication Sig Start Date End Date Taking? Authorizing Provider  albuterol (VENTOLIN HFA) 108 (90 Base) MCG/ACT inhaler Inhale 2 puffs into the lungs every 6 (six) hours as needed for wheezing or shortness of breath. 03/02/18  Yes [place], Betty G, MD  amLODipine (NORVASC) 2.5 MG tablet TAKE 1 TABLET BY MOUTH EVERY DAY Patient taking differently: Take 2.5 mg by mouth daily.  05/01/20  Yes Isaac Bliss, Rayford Halsted, MD  aspirin EC 81 MG tablet Take 1 tablet (81 mg total) by mouth at bedtime. 01/17/20  Yes Isaac Bliss, Rayford Halsted, MD  atorvastatin (LIPITOR) 20 MG tablet Take 1 tablet (20 mg total) by  mouth daily. Patient taking differently: Take 20 mg by mouth every evening.  08/15/20  Yes Isaac Bliss, Rayford Halsted, MD  carvedilol (COREG) 12.5 MG tablet TAKE 1 TABLET BY MOUTH 2 (TWO) TIMES DAILY WITH A MEAL. Patient taking differently: Take 12.5 mg by mouth 2 (two) times daily with a meal.  05/01/20  Yes Isaac Bliss, Rayford Halsted, MD  Cholecalciferol (VITAMIN D3) 125 MCG (5000 UT) TABS TAKE 1 TABLET BY MOUTH DAILY Patient taking differently: Take 5,000 Units by mouth daily.  06/26/20  Yes Isaac Bliss, Rayford Halsted, MD  HYDROcodone-acetaminophen (NORCO/VICODIN) 5-325 MG tablet Take 1 tablet by mouth every 6 (six) hours as needed for moderate pain. 07/13/20  Yes Isaac Bliss, Rayford Halsted, MD  LINZESS 290 MCG CAPS capsule TAKE [place] DAY Patient taking differently: Take 290 mcg by mouth daily as needed (constipation).  08/08/20  Yes Isaac Bliss, Rayford Halsted, MD  meclizine (ANTIVERT) 25 MG tablet TAKE 1 TABLET BY MOUTH DAILY AS NEEDED FOR DIZZINESS Patient taking differently: Take 25 mg by mouth daily as needed for dizziness.  06/26/20  Yes Isaac Bliss, Rayford Halsted, MD  pantoprazole (PROTONIX) 40 MG tablet TAKE 1 TABLET BY MOUTH EVERY DAY Patient taking differently: Take 40 mg by mouth daily.  05/01/20  Yes Isaac Bliss, Rayford Halsted, MD  traZODone (DESYREL) 50 MG tablet Take 1 tablet (50 mg total) by mouth at bedtime. 04/13/20  Yes Rigoberto Noel, MD  venlafaxine XR (EFFEXOR-XR) 150 MG 24 hr capsule TAKE 1 CAPSULE BY MOUTH EVERY DAY WITH BREAKFAST Patient taking differently: Take 150 mg by mouth daily with breakfast.  06/25/20  Yes Magrinat, Virgie Dad, MD  buPROPion (WELLBUTRIN SR) 150 MG 12 hr tablet Take 1 tablet (150 mg total) by mouth daily. Patient not taking: Reported on 08/20/2020 04/15/19   [place], Betty G, MD  clindamycin (CLINDAGEL) 1 % gel Apply topically 2 (two) times daily. Patient taking differently: Apply 1 application topically 2 (two) times daily as needed (rash).   01/08/18   Magrinat, Virgie Dad, MD  diclofenac Sodium (VOLTAREN) 1 % GEL Apply 4 g topically 4 (four) times daily as needed. Patient not taking: Reported on 08/20/2020 04/20/20   Hilts, Legrand Como, MD  furosemide (LASIX) 40 MG tablet TAKE 1/2 TABLET BY MOUTH ONCE DAILY AS NEEDED FOR EDEMA Patient taking differently: Take 20 mg by mouth daily as needed for edema.  05/01/20   Isaac Bliss, Rayford Halsted, MD  losartan (COZAAR) 100 MG tablet TAKE 1 TABLET BY MOUTH ONCE DAILY Patient not taking: Reported on 08/20/2020 04/06/18   [place], Betty G, MD  Sodium Sulfate-Mag Sulfate-KCl (SUTAB) [phone] MG TABS Take 1 kit by mouth as directed. MANUFACTURER CODES!! BIN: [id number] PCN: CN GROUP: [id number] MEMBER ID: [id number];FWY AS CASH;NO  PRIOR AUTHORIZATION 08/03/20   Mauri Pole, MD    Current Facility-Administered Medications  Medication Dose Route Frequency Provider Last Rate Last Admin  . 0.9 %  sodium chloride infusion   Intravenous Continuous Ralphael Southgate V, MD      . lactated ringers infusion   Intravenous Continuous Mauri Pole, MD 125 mL/hr at 08/21/20 0741 1,000 mL at 08/21/20 0741    Allergies as of 05/30/2020  . (No Known Allergies)    Family History  Problem Relation Age of Onset  . Dementia Mother 86  . Heart attack Mother 51  . Esophageal cancer Father 31  . Heart attack Father 7  . Stomach cancer Father 13  . Colon polyps Brother   . Asthma Other   . Hypertension Other   . Thyroid disease Other   . Heart attack Other   . Throat cancer Paternal Grandfather   . Colon cancer Neg Hx   . Rectal cancer Neg Hx     Social History   Socioeconomic History  . Marital status: Divorced    Spouse name: Not on file  . Number of children: 3  . Years of education: Not on file  . Highest education level: Not on file  Occupational History  . 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 Smoking status: Never Smoker  . Smokeless  tobacco: Never Used  Vaping Use  . Vaping Use: Never used  Substance and Sexual Activity  . Alcohol use: No  . Drug use: No  . Sexual activity: Not Currently    Birth control/protection: Post-menopausal  Other Topics Concern  . Not on file  Social History Narrative  . Not on file   Social Determinants of Health   Financial Resource Strain:   . Difficulty of Paying Living Expenses: Not on file  Food Insecurity:   . Worried About Charity fundraiser in the Last Year: Not on file  . Ran Out of Food in the Last Year: Not on file  Transportation Needs:   . Lack of Transportation (Medical): Not on file  . Lack of Transportation (Non-Medical): Not on file  Physical Activity:   . Days of Exercise per Week: Not on file  . Minutes of Exercise per Session: Not on file  Stress:   . Feeling of Stress : Not on file  Social Connections:   . Frequency of Communication with Friends and Family: Not on file  . Frequency of Social Gatherings with Friends and Family: Not on file  . Attends Religious Services: Not on file  . Active Member of Clubs or Organizations: Not on file  . Attends Archivist Meetings: Not on file  . Marital Status: Not on file  Intimate Partner Violence:   . Fear of Current or Ex-Partner: Not on file  . Emotionally Abused: Not on file  . Physically Abused: Not on file  . Sexually Abused: Not on file    Review of Systems:  All other review of systems negative except as mentioned in the HPI.  Physical Exam: Vital signs in last 24 hours: Pulse Rate:  [51] 51 (09/21 0722) Resp:  [21] 21 (09/21 0722) BP: (197)/(85) 197/85 (09/21 0722) SpO2:  [96 %] 96 % (09/21 0722) Weight:  [104.3 kg] 104.3 kg (09/21 0720)   General:   Alert,  Well-developed, well-nourished, pleasant and cooperative in NAD Lungs:  Clear throughout to auscultation.   Heart:  Regular  rate and rhythm; no murmurs, clicks, rubs,  or gallops. Abdomen:  Soft, nontender and nondistended.  Normal bowel sounds.   Neuro/Psych:  Alert and cooperative. Normal mood and affect. A and O x 3   K. Denzil Magnuson , MD [phone]

## 2020-08-22 ENCOUNTER — Encounter (HOSPITAL_COMMUNITY): Payer: Self-pay | Admitting: Gastroenterology

## 2020-08-22 LAB — SURGICAL PATHOLOGY

## 2020-08-23 NOTE — Anesthesia Postprocedure Evaluation (Signed)
Anesthesia Post Note  Patient: Tamara [name]  Procedure(s) Performed: COLONOSCOPY WITH PROPOFOL (N/A ) POLYPECTOMY     Patient location during evaluation: PACU Anesthesia Type: MAC Level of consciousness: awake and alert Pain management: pain level controlled Vital Signs Assessment: post-procedure vital signs reviewed and stable Respiratory status: spontaneous breathing Cardiovascular status: stable Anesthetic complications: no Comments: Pt hypertensive post procedure. Hydralazine given. BP improved.    No complications documented.  Last Vitals:  Vitals:   08/21/20 1020 08/21/20 1030  BP: (!) 215/80 (!) 167/99  Pulse:  (!) 52  Resp:  (!) 24  Temp:    SpO2:  100%    Last Pain:  Vitals:   08/21/20 0945  TempSrc:   PainSc: 0-No pain          Nolon Nations

## 2020-08-28 ENCOUNTER — Encounter: Payer: Self-pay | Admitting: Gastroenterology

## 2020-08-30 ENCOUNTER — Encounter: Payer: Self-pay | Admitting: Internal Medicine

## 2020-09-04 MED ORDER — LOSARTAN POTASSIUM 100 MG PO TABS
100.0000 mg | ORAL_TABLET | Freq: Every day | ORAL | 1 refills | Status: DC
Start: 2020-09-04 — End: 2020-12-18

## 2020-09-18 DIAGNOSIS — G4733 Obstructive sleep apnea (adult) (pediatric): Secondary | ICD-10-CM | POA: Diagnosis not present

## 2020-09-23 ENCOUNTER — Other Ambulatory Visit: Payer: Self-pay | Admitting: Internal Medicine

## 2020-09-23 ENCOUNTER — Other Ambulatory Visit: Payer: Self-pay | Admitting: Oncology

## 2020-10-03 ENCOUNTER — Telehealth: Payer: Self-pay | Admitting: Pulmonary Disease

## 2020-10-03 NOTE — Telephone Encounter (Signed)
Called and spoke with patient who states that she needs refill on her Trazodone.  Dr. Elsworth Soho if you are ok with refilling please sign order pended below

## 2020-10-04 NOTE — Telephone Encounter (Signed)
Called and spoke with pt letting her know that Dr. Elsworth Soho would not refill her trazodone due to it being a year since she was last seen. Stated to her that she could either contact PCP to see if they would refill med or schedule an appt at office. Pt has scheduled an appt with TP 11/11. Nothing further needed.

## 2020-10-04 NOTE — Telephone Encounter (Signed)
Last seen 09/2019  Will need OV or she can get from PCP

## 2020-10-11 ENCOUNTER — Other Ambulatory Visit: Payer: Self-pay

## 2020-10-11 ENCOUNTER — Encounter: Payer: Self-pay | Admitting: Adult Health

## 2020-10-11 ENCOUNTER — Ambulatory Visit: Payer: BLUE CROSS/BLUE SHIELD | Admitting: Adult Health

## 2020-10-11 VITALS — BP 150/86 | HR 69 | Temp 97.5°F | Ht 65.0 in | Wt 234.0 lb

## 2020-10-11 DIAGNOSIS — G4733 Obstructive sleep apnea (adult) (pediatric): Secondary | ICD-10-CM

## 2020-10-11 DIAGNOSIS — R69 Illness, unspecified: Secondary | ICD-10-CM | POA: Diagnosis not present

## 2020-10-11 DIAGNOSIS — Z23 Encounter for immunization: Secondary | ICD-10-CM | POA: Diagnosis not present

## 2020-10-11 DIAGNOSIS — F5104 Psychophysiologic insomnia: Secondary | ICD-10-CM | POA: Diagnosis not present

## 2020-10-11 MED ORDER — TRAZODONE HCL 50 MG PO TABS: 50.0000 mg | ORAL_TABLET | Freq: Every evening | ORAL | 0 refills | Status: DC | PRN

## 2020-10-11 NOTE — Assessment & Plan Note (Signed)
Obstructive sleep apnea with excellent control and compliance on nocturnal CPAP.  Plan  Patient Instructions  Keep up good work  Continue on CPAP At bedtime   Work on healthy weight  Do not drive if sleepy  Trazodone 50mg  At bedtime  For sleep , use with caution  Flu shot today  Follow up in 1 year and As needed

## 2020-10-11 NOTE — Progress Notes (Signed)
@Patient  ID: Tamara [name], female    DOB: 07-08-1951, 69 y.o.   MRN: [id number]  Chief Complaint  Patient presents with  . Follow-up    OSA     Referring provider: Isaac Bliss, [facility]*  HPI: 69 year old female followed for obstructive sleep apnea and insomnia Medical history significant for morbid obesity status post gastric sleeve surgery in 2016  TEST/EVENTS :  PSG 03/2013-291 lbs-[name] OSA with AHI 66/hour which was corrected with CPAP of 9 cm.  She also had moderate PLM's 40/hour, however PLM arousal index was low at 0.9/hour.  05/2018 HST mild AHI 13/h , predom supine  10/11/2020 Follow up : OSA and Insomnia  Patient presents for a 1 year follow-up.  Patient has underlying sleep apnea is on nocturnal CPAP.  Patient says she is doing very well on CPAP.  She wears her CPAP every single night.  Never misses any nights.  She feels rested with no significant daytime sleepiness.  She feels that she benefits from her CPAP.  Download shows excellent compliance with 100% usage.  Daily average usage at 9 hours.  Patient is on CPAP 9 cm H2O.  AHI is 4.8.  Patient has insomnia with difficulty falling asleep.  She is on trazodone 50 mg at bedtime.  She says this helps her so much.  She can sleep so well with this.  As above patient has had gastric sleeve surgery in 2016.  She lost over 80 pounds.  Over the last year her weight has slowly trended up.  She is up 24 pounds since last year.  No Known Allergies  Immunization History  Administered Date(s) Administered  . Fluad Quad(high Dose 65+) 08/31/2019, 10/11/2020  . Influenza Split 08/09/2012  . Influenza, High Dose Seasonal PF 10/22/2015, 09/09/2016, 09/14/2017, 09/10/2018  . Influenza,inj,Quad PF,6+ Mos 09/27/2013, 10/02/2014  . Influenza,inj,quad, With Preservative 07/01/2017  . Janssen (J&J) SARS-COV-2 Vaccination 01/15/2020  . Pneumococcal Conjugate-13 10/22/2015, 07/01/2017  . Pneumococcal Polysaccharide-23  02/16/2009, 10/26/2019  . Td 07/09/2006  . Tdap 09/09/2016  . Zoster 08/09/2012    Past Medical History:  Diagnosis Date  . Allergy    seasonal allergies (Fall)  . Anxiety    on meds  . Arthritis    knees   . Asthma    rare;only when around alot of dust-Ventolin inhaler as needed  . Breast cancer ([place])     left. States she did not have lymph nodes removed  . Bursitis of right shoulder   . Cataract    not had sx as of yet  . Cellulitis and abscess of leg   . Complication of anesthesia    was told 01/06/14 that airway was small  . Depression    takes Effexor and Wellbutrin daily  . GERD (gastroesophageal reflux disease)    on meds  . Hearing loss   . History of bronchitis 1966  . Hyperlipidemia    on meds  . Hypertension    takes Coreg and Losartan daily  . Joint pain   . Joint swelling   . Neuromuscular disorder (HCC)    RLS  . Obese   . Peripheral edema    takes Furosemide daily as needed and Potassium daily  . Personal history of radiation therapy 2015  . Radiation 03/08/14-04/26/14   50.4 gray to left breast. Lumpectomy cavity boosted to 64.4 gray  . Restless legs syndrome    takes depakote  . Sleep apnea    uses CPAP  . Sleep apnea, obstructive  uses CPAP  . Wears glasses     Tobacco History: Social History   Tobacco Use  Smoking Status Never Smoker  Smokeless Tobacco Never Used   Counseling given: Not Answered   Outpatient Medications Prior to Visit  Medication Sig Dispense Refill  . albuterol (VENTOLIN HFA) 108 (90 Base) MCG/ACT inhaler Inhale 2 puffs into the lungs every 6 (six) hours as needed for wheezing or shortness of breath. 36 each 2  . amLODipine (NORVASC) 2.5 MG tablet TAKE 1 TABLET BY MOUTH EVERY DAY (Patient taking differently: Take 2.5 mg by mouth daily. ) 90 tablet 1  . ASPIRIN LOW DOSE 81 MG EC tablet TAKE 1 TABLET BY MOUTH AT BEDTIME 30 tablet 8  . atorvastatin (LIPITOR) 20 MG tablet Take 1 tablet (20 mg total) by mouth daily.  (Patient taking differently: Take 20 mg by mouth every evening. ) 90 tablet 3  . buPROPion (WELLBUTRIN SR) 150 MG 12 hr tablet Take 1 tablet (150 mg total) by mouth daily. 90 tablet 1  . carvedilol (COREG) 12.5 MG tablet TAKE 1 TABLET BY MOUTH 2 (TWO) TIMES DAILY WITH A MEAL. (Patient taking differently: Take 12.5 mg by mouth 2 (two) times daily with a meal. ) 180 tablet 1  . Cholecalciferol (VITAMIN D3) 125 MCG (5000 UT) TABS TAKE 1 TABLET BY MOUTH DAILY (Patient taking differently: Take 5,000 Units by mouth daily. ) 30 tablet 8  . clindamycin (CLINDAGEL) 1 % gel Apply topically 2 (two) times daily. (Patient taking differently: Apply 1 application topically 2 (two) times daily as needed (rash). ) 30 g 0  . furosemide (LASIX) 40 MG tablet TAKE 1/2 TABLET BY MOUTH ONCE DAILY AS NEEDED FOR EDEMA (Patient taking differently: Take 20 mg by mouth daily as needed for edema. ) 45 tablet 2  . HYDROcodone-acetaminophen (NORCO/VICODIN) 5-325 MG tablet Take 1 tablet by mouth every 6 (six) hours as needed for moderate pain. 60 tablet 0  . LINZESS 290 MCG CAPS capsule TAKE 1 CAPSULE BY MOUTH EVERY DAY (Patient taking differently: Take 290 mcg by mouth daily as needed (constipation). ) 30 capsule 2  . losartan (COZAAR) 100 MG tablet Take 1 tablet (100 mg total) by mouth daily. 90 tablet 1  . meclizine (ANTIVERT) 25 MG tablet TAKE 1 TABLET BY MOUTH DAILY AS NEEDED FOR DIZZINESS 30 tablet 2  . pantoprazole (PROTONIX) 40 MG tablet TAKE 1 TABLET BY MOUTH EVERY DAY (Patient taking differently: Take 40 mg by mouth daily. ) 90 tablet 1  . venlafaxine XR (EFFEXOR-XR) 150 MG 24 hr capsule TAKE 1 CAPSULE BY MOUTH EVERY DAY WITH BREAKFAST 30 capsule 2  . traZODone (DESYREL) 50 MG tablet Take 1 tablet (50 mg total) by mouth at bedtime. 90 tablet 1   No facility-administered medications prior to visit.     Review of Systems:   Constitutional:   No  weight loss, night sweats,  Fevers, chills, fatigue, or   lassitude.  HEENT:   No headaches,  Difficulty swallowing,  Tooth/dental problems, or  Sore throat,                No sneezing, itching, ear ache, nasal congestion, post nasal drip,   CV:  No chest pain,  Orthopnea, PND, swelling in lower extremities, anasarca, dizziness, palpitations, syncope.   GI  No heartburn, indigestion, abdominal pain, nausea, vomiting, diarrhea, change in bowel habits, loss of appetite, bloody stools.   Resp: No shortness of breath with exertion or at rest.  No excess  mucus, no productive cough,  No non-productive cough,  No coughing up of blood.  No change in color of mucus.  No wheezing.  No chest wall deformity  Skin: no rash or lesions.  GU: no dysuria, change in color of urine, no urgency or frequency.  No flank pain, no hematuria   MS:  No joint pain or swelling.  No decreased range of motion.  No back pain.    Physical Exam  BP (!) 150/86 (BP Location: Left Arm, Patient Position: Sitting, Cuff Size: Large)   Pulse 69   Temp (!) 97.5 F (36.4 C) (Temporal)   Ht 5\' 5"  (1.651 m)   Wt 234 lb (106.1 kg)   SpO2 95%   BMI 38.94 kg/m   GEN: A/Ox3; pleasant , NAD, well nourished    HEENT:  [place]/AT,    NOSE-clear, THROAT-clear, no lesions, no postnasal drip or exudate noted.   NECK:  Supple w/ fair ROM; no JVD; normal carotid impulses w/o bruits; no thyromegaly or nodules palpated; no lymphadenopathy.    RESP  Clear  P & A; w/o, wheezes/ rales/ or rhonchi. no accessory muscle use, no dullness to percussion  CARD:  RRR, no m/r/g, no peripheral edema, pulses intact, no cyanosis or clubbing.  GI:   Soft & nt; nml bowel sounds; no organomegaly or masses detected.   Musco: Warm bil, no deformities or joint swelling noted.   Neuro: alert, no focal deficits noted.    Skin: Warm, no lesions or rashes    Lab Results:  CBC    BNP No results found for: BNP  ProBNP No results found for: PROBNP  Imaging: No results found.    No flowsheet data  found.  No results found for: NITRICOXIDE      Assessment & Plan:   Sleep apnea Obstructive sleep apnea with excellent control and compliance on nocturnal CPAP.  Plan  Patient Instructions  Keep up good work  Continue on CPAP At bedtime   Work on healthy weight  Do not drive if sleepy  Trazodone 50mg  At bedtime  For sleep , use with caution  Flu shot today  Follow up in 1 year and As needed        Insomnia Insomnia.  Patient education on healthy sleep regimen.  Patient has been using trazodone with improved symptom control with insomnia. Patient education on sedating effects of trazodone and to use with caution.  Plan  Patient Instructions  Keep up good work  Continue on CPAP At bedtime   Work on healthy weight  Do not drive if sleepy  Trazodone 50mg  At bedtime  For sleep , use with caution  Flu shot today  Follow up in 1 year and As needed        Class 2 obesity with body mass index (BMI) of 36.0 to 36.9 in adult Counseled on healthy weight loss     Rexene Edison, NP 10/11/2020

## 2020-10-11 NOTE — Assessment & Plan Note (Signed)
Counseled on healthy weight loss

## 2020-10-11 NOTE — Assessment & Plan Note (Signed)
Insomnia.  Patient education on healthy sleep regimen.  Patient has been using trazodone with improved symptom control with insomnia. Patient education on sedating effects of trazodone and to use with caution.  Plan  Patient Instructions  Keep up good work  Continue on CPAP At bedtime   Work on healthy weight  Do not drive if sleepy  Trazodone 50mg  At bedtime  For sleep , use with caution  Flu shot today  Follow up in 1 year and As needed

## 2020-10-11 NOTE — Patient Instructions (Addendum)
Keep up good work  Continue on CPAP At bedtime   Work on healthy weight  Do not drive if sleepy  Trazodone 50mg  At bedtime  For sleep , use with caution  Flu shot today  Follow up in 1 year and As needed

## 2020-10-12 ENCOUNTER — Other Ambulatory Visit: Payer: Self-pay | Admitting: Adult Health

## 2020-10-12 ENCOUNTER — Other Ambulatory Visit: Payer: Self-pay | Admitting: Internal Medicine

## 2020-10-12 DIAGNOSIS — M159 Polyosteoarthritis, unspecified: Secondary | ICD-10-CM

## 2020-10-12 DIAGNOSIS — G894 Chronic pain syndrome: Secondary | ICD-10-CM

## 2020-10-15 ENCOUNTER — Inpatient Hospital Stay: Payer: Medicare HMO | Admitting: Adult Health

## 2020-10-15 NOTE — Telephone Encounter (Signed)
Please deny.  Patient will need an office visit.

## 2020-10-17 ENCOUNTER — Ambulatory Visit (INDEPENDENT_AMBULATORY_CARE_PROVIDER_SITE_OTHER): Payer: Medicare HMO | Admitting: Internal Medicine

## 2020-10-17 ENCOUNTER — Encounter: Payer: Self-pay | Admitting: Internal Medicine

## 2020-10-17 ENCOUNTER — Other Ambulatory Visit: Payer: Self-pay

## 2020-10-17 VITALS — BP 120/84 | HR 48 | Temp 98.3°F | Wt 234.4 lb

## 2020-10-17 DIAGNOSIS — I10 Essential (primary) hypertension: Secondary | ICD-10-CM | POA: Diagnosis not present

## 2020-10-17 DIAGNOSIS — M159 Polyosteoarthritis, unspecified: Secondary | ICD-10-CM

## 2020-10-17 DIAGNOSIS — G894 Chronic pain syndrome: Secondary | ICD-10-CM

## 2020-10-17 MED ORDER — HYDROCODONE-ACETAMINOPHEN 5-325 MG PO TABS
1.0000 | ORAL_TABLET | Freq: Four times a day (QID) | ORAL | 0 refills | Status: DC | PRN
Start: 2020-10-17 — End: 2021-01-23

## 2020-10-17 MED ORDER — HYDROCODONE-ACETAMINOPHEN 5-325 MG PO TABS: 1.0000 | ORAL_TABLET | Freq: Four times a day (QID) | ORAL | 0 refills | Status: DC | PRN

## 2020-10-17 NOTE — Progress Notes (Signed)
Established Patient Office Visit     This visit occurred during the SARS-CoV-2 public health emergency.  Safety protocols were in place, including screening questions prior to the visit, additional usage of staff PPE, and extensive cleaning of exam room while observing appropriate contact time as indicated for disinfecting solutions.    CC/Reason for Visit: Medication refills, discuss chronic conditions  HPI: Tamara [name] is a 69 y.o. female who is coming in today for the above mentioned reasons. Past Medical History is significant for: Morbid obesity, hypertension, GERD, hyperlipidemia, vitamin D deficiency, obstructive sleep apnea on CPAP, history of breast cancer followed by Dr. Jana Hakim.  She recently resumed losartan due to elevated blood pressures.  She is also due for hydrocodone refills that she takes for chronic pain and multiple joint osteoarthritis.  She has concerns about inability to lose weight.  She states her blood pressures at home have been in the 120/80 range.  She has scheduled her Covid booster, she had her flu vaccine.   Past Medical/Surgical History: Past Medical History:  Diagnosis Date   Allergy    seasonal allergies (Fall)   Anxiety    on meds   Arthritis    knees    Asthma    rare;only when around alot of dust-Ventolin inhaler as needed   Breast cancer ([place])     left. States she did not have lymph nodes removed   Bursitis of right shoulder    Cataract    not had sx as of yet   Cellulitis and abscess of leg    Complication of anesthesia    was told 01/06/14 that airway was small   Depression    takes Effexor and Wellbutrin daily   GERD (gastroesophageal reflux disease)    on meds   Hearing loss    History of bronchitis 1966   Hyperlipidemia    on meds   Hypertension    takes Coreg and Losartan daily   Joint pain    Joint swelling    Neuromuscular disorder (HCC)    RLS   Obese    Peripheral edema    takes  Furosemide daily as needed and Potassium daily   Personal history of radiation therapy 2015   Radiation 03/08/14-04/26/14   50.4 gray to left breast. Lumpectomy cavity boosted to 64.4 gray   Restless legs syndrome    takes depakote   Sleep apnea    uses CPAP   Sleep apnea, obstructive    uses CPAP   Wears glasses     Past Surgical History:  Procedure Laterality Date   BREAST LUMPECTOMY Left 2015   BREAST LUMPECTOMY WITH NEEDLE LOCALIZATION Left 01/24/2014   Procedure: BREAST LUMPECTOMY WITH NEEDLE LOCALIZATION;  Surgeon: Edward Jolly, MD;  Location: [facility];  Service: General;  Laterality: Left;   COLONOSCOPY N/A 03/20/2017   Procedure: COLONOSCOPY;  Surgeon: Mauri Pole, MD;  Location: [facility];  Service: Endoscopy;  Laterality: N/A;   COLONOSCOPY  2018   COLONOSCOPY WITH PROPOFOL N/A 08/21/2020   Procedure: COLONOSCOPY WITH PROPOFOL;  Surgeon: Mauri Pole, MD;  Location: [facility];  Service: Endoscopy;  Laterality: N/A;   DILATATION & CURETTAGE/HYSTEROSCOPY WITH TRUECLEAR N/A 01/06/2014   Procedure: DILATATION & CURETTAGE/HYSTEROSCOPY WITH TRUCLEAR;  Surgeon: Shon Millet II, MD;  Location: [facility];  Service: Gynecology;  Laterality: N/A;   HYSTEROPLASTY  01/2014   INNER EAR SURGERY Bilateral    for hearing loss   JOINT REPLACEMENT Right  Knee   LAPAROSCOPIC GASTRIC SLEEVE RESECTION N/A 01/28/2016   Procedure: LAPAROSCOPIC GASTRIC SLEEVE RESECTION WITH UPPER ENDO;  Surgeon: Excell Seltzer, MD;  Location: [facility];  Service: General;  Laterality: N/A;   LIPOSUCTION WITH LIPOFILLING Bilateral 06/20/2019   Procedure: BILATERAL THIGH LIPECTOMY;  Surgeon: Irene Limbo, MD;  Location: [facility] [place];  Service: Plastics;  Laterality: Bilateral;   PANNICULECTOMY N/A 06/18/2018   Procedure: PANNICULECTOMY;  Surgeon: Irene Limbo, MD;  Location: [facility];  Service: Plastics;  Laterality: N/A;   POLYPECTOMY  08/21/2020   Procedure:  POLYPECTOMY;  Surgeon: Mauri Pole, MD;  Location: [facility];  Service: Endoscopy;;   RE-EXCISION OF BREAST LUMPECTOMY Left 02/02/2014   Procedure: RE-EXCISION OF LEFT BREAST LUMPECTOMY;  Surgeon: Edward Jolly, MD;  Location: [facility];  Service: General;  Laterality: Left;   [place] EXTRACTION  1970    Social History:  reports that she has never smoked. She has never used smokeless tobacco. She reports that she does not drink alcohol and does not use drugs.  Allergies: No Known Allergies  Family History:  Family History  Problem Relation Age of Onset   Dementia Mother 6   Heart attack Mother 43   Esophageal cancer Father 50   Heart attack Father 69   Stomach cancer Father 64   Colon polyps Brother    Asthma Other    Hypertension Other    Thyroid disease Other    Heart attack Other    Throat cancer Paternal Grandfather    Colon cancer Neg Hx    Rectal cancer Neg Hx      Current Outpatient Medications:    albuterol (VENTOLIN HFA) 108 (90 Base) MCG/ACT inhaler, Inhale 2 puffs into the lungs every 6 (six) hours as needed for wheezing or shortness of breath., Disp: 36 each, Rfl: 2   amLODipine (NORVASC) 2.5 MG tablet, TAKE 1 TABLET BY MOUTH EVERY DAY (Patient taking differently: Take 2.5 mg by mouth daily. ), Disp: 90 tablet, Rfl: 1   ASPIRIN LOW DOSE 81 MG EC tablet, TAKE 1 TABLET BY MOUTH AT BEDTIME, Disp: 30 tablet, Rfl: 8   atorvastatin (LIPITOR) 20 MG tablet, Take 1 tablet (20 mg total) by mouth daily. (Patient taking differently: Take 20 mg by mouth every evening. ), Disp: 90 tablet, Rfl: 3   buPROPion (WELLBUTRIN SR) 150 MG 12 hr tablet, Take 1 tablet (150 mg total) by mouth daily., Disp: 90 tablet, Rfl: 1   carvedilol (COREG) 12.5 MG tablet, TAKE 1 TABLET BY MOUTH 2 (TWO) TIMES DAILY WITH A MEAL. (Patient taking differently: Take 12.5 mg by mouth 2 (two) times daily with a meal. ), Disp: 180 tablet, Rfl: 1    Cholecalciferol (VITAMIN D3) 125 MCG (5000 UT) TABS, TAKE 1 TABLET BY MOUTH DAILY (Patient taking differently: Take 5,000 Units by mouth daily. ), Disp: 30 tablet, Rfl: 8   clindamycin (CLINDAGEL) 1 % gel, Apply topically 2 (two) times daily. (Patient taking differently: Apply 1 application topically 2 (two) times daily as needed (rash). ), Disp: 30 g, Rfl: 0   furosemide (LASIX) 40 MG tablet, TAKE 1/2 TABLET BY MOUTH ONCE DAILY AS NEEDED FOR EDEMA (Patient taking differently: Take 20 mg by mouth daily as needed for edema. ), Disp: 45 tablet, Rfl: 2   HYDROcodone-acetaminophen (NORCO/VICODIN) 5-325 MG tablet, Take 1 tablet by mouth every 6 (six) hours as needed for moderate pain., Disp: 60 tablet, Rfl: 0   LINZESS  290 MCG CAPS capsule, TAKE 1 CAPSULE BY MOUTH EVERY DAY (Patient taking differently: Take 290 mcg by mouth daily as needed (constipation). ), Disp: 30 capsule, Rfl: 2   losartan (COZAAR) 100 MG tablet, Take 1 tablet (100 mg total) by mouth daily., Disp: 90 tablet, Rfl: 1   meclizine (ANTIVERT) 25 MG tablet, TAKE 1 TABLET BY MOUTH DAILY AS NEEDED FOR DIZZINESS, Disp: 30 tablet, Rfl: 2   pantoprazole (PROTONIX) 40 MG tablet, TAKE 1 TABLET BY MOUTH EVERY DAY (Patient taking differently: Take 40 mg by mouth daily. ), Disp: 90 tablet, Rfl: 1   traZODone (DESYREL) 50 MG tablet, Take 1 tablet (50 mg total) by mouth at bedtime as needed for sleep., Disp: 90 tablet, Rfl: 0   venlafaxine XR (EFFEXOR-XR) 150 MG 24 hr capsule, TAKE 1 CAPSULE BY MOUTH EVERY DAY WITH BREAKFAST, Disp: 30 capsule, Rfl: 2   HYDROcodone-acetaminophen (NORCO/VICODIN) 5-325 MG tablet, Take 1 tablet by mouth every 6 (six) hours as needed for moderate pain., Disp: 60 tablet, Rfl: 0   HYDROcodone-acetaminophen (NORCO/VICODIN) 5-325 MG tablet, Take 1 tablet by mouth every 6 (six) hours as needed for moderate pain., Disp: 60 tablet, Rfl: 0  Review of Systems:  Constitutional: Denies fever, chills, diaphoresis, appetite  change and fatigue.  HEENT: Denies photophobia, eye pain, redness, hearing loss, ear pain, congestion, sore throat, rhinorrhea, sneezing, mouth sores, trouble swallowing, neck pain, neck stiffness and tinnitus.   Respiratory: Denies SOB, DOE, cough, chest tightness,  and wheezing.   Cardiovascular: Denies chest pain, palpitations and leg swelling.  Gastrointestinal: Denies nausea, vomiting, abdominal pain, diarrhea, constipation, blood in stool and abdominal distention.  Genitourinary: Denies dysuria, urgency, frequency, hematuria, flank pain and difficulty urinating.  Endocrine: Denies: hot or cold intolerance, sweats, changes in hair or nails, polyuria, polydipsia. Musculoskeletal: Denies myalgias, back pain, joint swelling, arthralgias and gait problem.  Skin: Denies pallor, rash and wound.  Neurological: Denies dizziness, seizures, syncope, weakness, light-headedness, numbness and headaches.  Hematological: Denies adenopathy. Easy bruising, personal or family bleeding history  Psychiatric/Behavioral: Denies suicidal ideation, mood changes, confusion, nervousness, sleep disturbance and agitation    Physical Exam: Vitals:   10/17/20 0808  BP: 120/84  Pulse: (!) 48  Temp: 98.3 F (36.8 C)  TempSrc: Oral  SpO2: 98%  Weight: 234 lb 6.4 oz (106.3 kg)    Body mass index is 39.01 kg/m.   Constitutional: NAD, calm, comfortable, obese Eyes: PERRL, lids and conjunctivae normal, wears corrective lenses ENMT: Mucous membranes are moist.  Respiratory: clear to auscultation bilaterally, no wheezing, no crackles. Normal respiratory effort. No accessory muscle use.  Cardiovascular: Regular rate and rhythm, no murmurs / rubs / gallops. No extremity edema. Neurologic: Grossly intact and nonfocal Psychiatric: Normal judgment and insight. Alert and oriented x 3. Normal mood.    Impression and Plan:  Generalized osteoarthritis of multiple sites  Chronic pain disorder -PDMP reviewed, no red  flags, overdose risk score is 70. -Refill hydrocodone 5/325 mg to take every 6 hours that is limited to 60 tablets a month, she will be provided with 67-month refills today.  Morbid obesity ([place]) -Discussed healthy lifestyle, including increased physical activity and better food choices to promote weight loss.  Primary hypertension -Blood pressures well controlled on losartan.    Patient Instructions  -Nice seeing you today!!  -Schedule follow up in 3 months.     Lelon Frohlich, MD [facility]

## 2020-10-17 NOTE — Patient Instructions (Signed)
-  Nice seeing you today!!  -Schedule follow up in 3 months. 

## 2020-10-19 DIAGNOSIS — G4733 Obstructive sleep apnea (adult) (pediatric): Secondary | ICD-10-CM | POA: Diagnosis not present

## 2020-10-21 ENCOUNTER — Other Ambulatory Visit: Payer: Self-pay | Admitting: Internal Medicine

## 2020-10-21 DIAGNOSIS — I10 Essential (primary) hypertension: Secondary | ICD-10-CM

## 2020-10-21 DIAGNOSIS — K219 Gastro-esophageal reflux disease without esophagitis: Secondary | ICD-10-CM

## 2020-11-08 ENCOUNTER — Other Ambulatory Visit: Payer: Self-pay | Admitting: Internal Medicine

## 2020-11-08 DIAGNOSIS — Z0000 Encounter for general adult medical examination without abnormal findings: Secondary | ICD-10-CM

## 2020-11-12 DIAGNOSIS — Z23 Encounter for immunization: Secondary | ICD-10-CM | POA: Diagnosis not present

## 2020-11-16 ENCOUNTER — Other Ambulatory Visit: Payer: Self-pay | Admitting: Internal Medicine

## 2020-11-16 DIAGNOSIS — K219 Gastro-esophageal reflux disease without esophagitis: Secondary | ICD-10-CM

## 2020-11-18 DIAGNOSIS — G4733 Obstructive sleep apnea (adult) (pediatric): Secondary | ICD-10-CM | POA: Diagnosis not present

## 2020-12-16 ENCOUNTER — Other Ambulatory Visit: Payer: Self-pay | Admitting: Internal Medicine

## 2020-12-19 ENCOUNTER — Other Ambulatory Visit: Payer: Self-pay | Admitting: Internal Medicine

## 2020-12-19 ENCOUNTER — Telehealth: Payer: Self-pay | Admitting: Oncology

## 2020-12-19 ENCOUNTER — Other Ambulatory Visit: Payer: Self-pay | Admitting: Oncology

## 2020-12-19 NOTE — Telephone Encounter (Signed)
Scheduled appts per 1/19 sch msg. Pt confirmed appt date and time.

## 2021-01-03 DIAGNOSIS — G4733 Obstructive sleep apnea (adult) (pediatric): Secondary | ICD-10-CM | POA: Diagnosis not present

## 2021-01-04 ENCOUNTER — Other Ambulatory Visit: Payer: Self-pay | Admitting: *Deleted

## 2021-01-04 MED ORDER — CENTRUM ADULTS PO TABS: 1.0000 | ORAL_TABLET | Freq: Every day | ORAL | 1 refills | Status: DC

## 2021-01-04 NOTE — Telephone Encounter (Signed)
Rx sent 

## 2021-01-08 ENCOUNTER — Other Ambulatory Visit: Payer: Self-pay | Admitting: Adult Health

## 2021-01-09 ENCOUNTER — Ambulatory Visit
Admission: RE | Admit: 2021-01-09 | Discharge: 2021-01-09 | Disposition: A | Discharge status: Home / Self Care | Payer: Medicare HMO | Source: Ambulatory Visit | Attending: Internal Medicine | Admitting: Internal Medicine

## 2021-01-09 ENCOUNTER — Other Ambulatory Visit: Payer: Self-pay

## 2021-01-09 DIAGNOSIS — Z0000 Encounter for general adult medical examination without abnormal findings: Secondary | ICD-10-CM

## 2021-01-09 DIAGNOSIS — Z1231 Encounter for screening mammogram for malignant neoplasm of breast: Secondary | ICD-10-CM | POA: Diagnosis not present

## 2021-01-09 NOTE — Telephone Encounter (Signed)
Recall is in place for f/u with RA. rx has been sent to pharmacy. Nothing further needed.

## 2021-01-09 NOTE — Telephone Encounter (Signed)
OK to refill She will need FU OV in 6 mnths 03/2021 with TP/ me

## 2021-01-09 NOTE — Telephone Encounter (Signed)
Dr. Alva, please advise if you are okay refilling med. 

## 2021-01-13 ENCOUNTER — Encounter: Payer: Self-pay | Admitting: Oncology

## 2021-01-16 DIAGNOSIS — G4733 Obstructive sleep apnea (adult) (pediatric): Secondary | ICD-10-CM | POA: Diagnosis not present

## 2021-01-19 ENCOUNTER — Other Ambulatory Visit: Payer: Self-pay | Admitting: Pulmonary Disease

## 2021-01-21 ENCOUNTER — Telehealth: Payer: Self-pay | Admitting: Oncology

## 2021-01-21 ENCOUNTER — Ambulatory Visit: Payer: Medicare HMO | Admitting: Oncology

## 2021-01-21 NOTE — Telephone Encounter (Signed)
Called pt per 2/14 sch msg - pt states she does not want to schedule [facility] appt - she feels fine and doesn't need a follow up at the moment.

## 2021-01-21 NOTE — Telephone Encounter (Signed)
Patient requesting refill on her traZODone. Order is pended below.

## 2021-01-23 ENCOUNTER — Ambulatory Visit (INDEPENDENT_AMBULATORY_CARE_PROVIDER_SITE_OTHER): Payer: Medicare HMO | Admitting: Internal Medicine

## 2021-01-23 ENCOUNTER — Other Ambulatory Visit: Payer: Self-pay

## 2021-01-23 ENCOUNTER — Encounter: Payer: Self-pay | Admitting: Internal Medicine

## 2021-01-23 VITALS — BP 130/80 | HR 52 | Temp 98.3°F | Wt 237.8 lb

## 2021-01-23 DIAGNOSIS — D0512 Intraductal carcinoma in situ of left breast: Secondary | ICD-10-CM | POA: Diagnosis not present

## 2021-01-23 DIAGNOSIS — F419 Anxiety disorder, unspecified: Secondary | ICD-10-CM

## 2021-01-23 DIAGNOSIS — E785 Hyperlipidemia, unspecified: Secondary | ICD-10-CM

## 2021-01-23 DIAGNOSIS — I10 Essential (primary) hypertension: Secondary | ICD-10-CM

## 2021-01-23 DIAGNOSIS — R296 Repeated falls: Secondary | ICD-10-CM

## 2021-01-23 DIAGNOSIS — G4733 Obstructive sleep apnea (adult) (pediatric): Secondary | ICD-10-CM

## 2021-01-23 DIAGNOSIS — F32A Depression, unspecified: Secondary | ICD-10-CM

## 2021-01-23 DIAGNOSIS — G894 Chronic pain syndrome: Secondary | ICD-10-CM

## 2021-01-23 DIAGNOSIS — E559 Vitamin D deficiency, unspecified: Secondary | ICD-10-CM | POA: Diagnosis not present

## 2021-01-23 DIAGNOSIS — M159 Polyosteoarthritis, unspecified: Secondary | ICD-10-CM

## 2021-01-23 DIAGNOSIS — K219 Gastro-esophageal reflux disease without esophagitis: Secondary | ICD-10-CM

## 2021-01-23 DIAGNOSIS — R2689 Other abnormalities of gait and mobility: Secondary | ICD-10-CM

## 2021-01-23 DIAGNOSIS — R69 Illness, unspecified: Secondary | ICD-10-CM | POA: Diagnosis not present

## 2021-01-23 MED ORDER — HYDROCODONE-ACETAMINOPHEN 5-325 MG PO TABS: 1.0000 | ORAL_TABLET | Freq: Four times a day (QID) | ORAL | 0 refills | Status: DC | PRN

## 2021-01-23 MED ORDER — BUPROPION HCL ER (XL) 300 MG PO TB24: 300.0000 mg | ORAL_TABLET | Freq: Every day | ORAL | 1 refills | Status: DC

## 2021-01-23 MED ORDER — HYDROCODONE-ACETAMINOPHEN 5-325 MG PO TABS
1.0000 | ORAL_TABLET | Freq: Four times a day (QID) | ORAL | 0 refills | Status: DC | PRN
Start: 2021-01-23 — End: 2021-04-18

## 2021-01-23 NOTE — Progress Notes (Signed)
Established Patient Office Visit     This visit occurred during the SARS-CoV-2 public health emergency.  Safety protocols were in place, including screening questions prior to the visit, additional usage of staff PPE, and extensive cleaning of exam room while observing appropriate contact time as indicated for disinfecting solutions.    CC/Reason for Visit: Follow-up chronic medical conditions, discuss some acute concerns  HPI: Tamara [name] is a 70 y.o. female who is coming in today for the above mentioned reasons. Past Medical History is significant for:  Morbid obesity,hypertension, GERD, hyperlipidemia, vitamin D deficiency, obstructive sleep apnea on CPAP, history of breast cancer followed by Dr. Jana Hakim.   She is here today to discuss some acute issues:  1.  She is due for refills of her hydrocodone per narcotic contract.  2.  She has noticed worsening of her depression.  She has no suicidal intent or ideation, however she feels like she lacks energy and motivation to do things.  She has been sleeping less as well.  3.  She has noticed increasing balance issues.  She has had 2 falls over the past month.  Both of them were mechanical in nature.   Past Medical/Surgical History: Past Medical History:  Diagnosis Date  . Allergy    seasonal allergies (Fall)  . Anxiety    on meds  . Arthritis    knees   . Asthma    rare;only when around alot of dust-Ventolin inhaler as needed  . Breast cancer ([place])     left. States she did not have lymph nodes removed  . Bursitis of right shoulder   . Cataract    not had sx as of yet  . Cellulitis and abscess of leg   . Complication of anesthesia    was told 01/06/14 that airway was small  . Depression    takes Effexor and Wellbutrin daily  . GERD (gastroesophageal reflux disease)    on meds  . Hearing loss   . History of bronchitis 1966  . Hyperlipidemia    on meds  . Hypertension    takes Coreg and Losartan daily  .  Joint pain   . Joint swelling   . Neuromuscular disorder (HCC)    RLS  . Obese   . Peripheral edema    takes Furosemide daily as needed and Potassium daily  . Personal history of radiation therapy 2015  . Radiation 03/08/14-04/26/14   50.4 gray to left breast. Lumpectomy cavity boosted to 64.4 gray  . Restless legs syndrome    takes depakote  . Sleep apnea    uses CPAP  . Sleep apnea, obstructive    uses CPAP  . Wears glasses     Past Surgical History:  Procedure Laterality Date  . BREAST LUMPECTOMY Left 2015  . BREAST LUMPECTOMY WITH NEEDLE LOCALIZATION Left 01/24/2014   Procedure: BREAST LUMPECTOMY WITH NEEDLE LOCALIZATION;  Surgeon: Edward Jolly, MD;  Location: [place];  Service: General;  Laterality: Left;  . COLONOSCOPY N/A 03/20/2017   Procedure: COLONOSCOPY;  Surgeon: Mauri Pole, MD;  Location: [facility];  Service: Endoscopy;  Laterality: N/A;  . COLONOSCOPY  2018  . COLONOSCOPY WITH PROPOFOL N/A 08/21/2020   Procedure: COLONOSCOPY WITH PROPOFOL;  Surgeon: Mauri Pole, MD;  Location: [facility];  Service: Endoscopy;  Laterality: N/A;  . DILATATION & CURETTAGE/HYSTEROSCOPY WITH TRUECLEAR N/A 01/06/2014   Procedure: DILATATION & CURETTAGE/HYSTEROSCOPY WITH TRUCLEAR;  Surgeon: Shon Millet II, MD;  Location:  [facility];  Service: Gynecology;  Laterality: N/A;  . HYSTEROPLASTY  01/2014  . INNER EAR SURGERY Bilateral    for hearing loss  . JOINT REPLACEMENT Right    Knee  . LAPAROSCOPIC GASTRIC SLEEVE RESECTION N/A 01/28/2016   Procedure: LAPAROSCOPIC GASTRIC SLEEVE RESECTION WITH UPPER ENDO;  Surgeon: Excell Seltzer, MD;  Location: [facility];  Service: General;  Laterality: N/A;  . LIPOSUCTION WITH LIPOFILLING Bilateral 06/20/2019   Procedure: BILATERAL THIGH LIPECTOMY;  Surgeon: Irene Limbo, MD;  Location: [place];  Service: Plastics;  Laterality: Bilateral;  . PANNICULECTOMY N/A 06/18/2018   Procedure: PANNICULECTOMY;  Surgeon: Irene Limbo, MD;   Location: [place];  Service: Plastics;  Laterality: N/A;  . POLYPECTOMY  08/21/2020   Procedure: POLYPECTOMY;  Surgeon: Mauri Pole, MD;  Location[facility];  Service: Endoscopy;;  . RE-EXCISION OF BREAST LUMPECTOMY Left 02/02/2014   Procedure: RE-EXCISION OF LEFT BREAST LUMPECTOMY;  Surgeon: Edward Jolly, MD;  Location: [place];  Service: General;  Laterality: Left;  . TUBAL LIGATION    . [place] EXTRACTION  1970    Social History:  reports that she has never smoked. She has never used smokeless tobacco. She reports that she does not drink alcohol and does not use drugs.  Allergies: No Known Allergies  Family History:  Family History  Problem Relation Age of Onset  . Dementia Mother 23  . Heart attack Mother 91  . Esophageal cancer Father 58  . Heart attack Father 49  . Stomach cancer Father 109  . Colon polyps Brother   . Asthma Other   . Hypertension Other   . Thyroid disease Other   . Heart attack Other   . Throat cancer Paternal Grandfather   . Colon cancer Neg Hx   . Rectal cancer Neg Hx      Current Outpatient Medications:  .  albuterol (VENTOLIN HFA) 108 (90 Base) MCG/ACT inhaler, Inhale 2 puffs into the lungs every 6 (six) hours as needed for wheezing or shortness of breath., Disp: 36 each, Rfl: 2 .  amLODipine (NORVASC) 2.5 MG tablet, TAKE 1 TABLET BY MOUTH DAILY, Disp: 90 tablet, Rfl: 1 .  ASPIRIN LOW DOSE 81 MG EC tablet, TAKE 1 TABLET BY MOUTH AT BEDTIME, Disp: 30 tablet, Rfl: 8 .  atorvastatin (LIPITOR) 20 MG tablet, Take 1 tablet (20 mg total) by mouth daily. (Patient taking differently: Take 20 mg by mouth every evening.), Disp: 90 tablet, Rfl: 3 .  buPROPion (WELLBUTRIN XL) 300 MG 24 hr tablet, Take 1 tablet (300 mg total) by mouth daily., Disp: 90 tablet, Rfl: 1 .  carvedilol (COREG) 12.5 MG tablet, TAKE 1 TABLET BY MOUTH TWICE A DAY WITH A MEAL, Disp: 180 tablet, Rfl: 1 .  Cholecalciferol (VITAMIN D3) 125 MCG (5000 UT)  TABS, TAKE 1 TABLET BY MOUTH DAILY (Patient taking differently: Take 5,000 Units by mouth daily.), Disp: 30 tablet, Rfl: 8 .  clindamycin (CLINDAGEL) 1 % gel, Apply topically 2 (two) times daily. (Patient taking differently: Apply 1 application topically 2 (two) times daily as needed (rash).), Disp: 30 g, Rfl: 0 .  furosemide (LASIX) 40 MG tablet, TAKE 1/2 TABLET BY MOUTH ONCE DAILY AS NEEDED FOR EDEMA (Patient taking differently: Take 20 mg by mouth daily as needed for edema.), Disp: 45 tablet, Rfl: 2 .  LINZESS 290 MCG CAPS capsule, TAKE 1 CAPSULE BY MOUTH EVERY DAY, Disp: 90 capsule, Rfl: 2 .  losartan (COZAAR) 100 MG tablet, TAKE 1  TABLET BY MOUTH EVERY DAY, Disp: 90 tablet, Rfl: 1 .  meclizine (ANTIVERT) 25 MG tablet, TAKE 1 TABLET BY MOUTH DAILY AS NEEDED FOR DIZZINESS, Disp: 30 tablet, Rfl: 2 .  Multiple Vitamins-Minerals (CENTRUM ADULTS) TABS, Take 1 tablet by mouth daily., Disp: 90 tablet, Rfl: 1 .  pantoprazole (PROTONIX) 40 MG tablet, TAKE 1 TABLET BY MOUTH EVERY DAY, Disp: 90 tablet, Rfl: 1 .  traZODone (DESYREL) 50 MG tablet, TAKE 1 TABLET BY MOUTH EVERY DAY AT BEDTIME AS NEEDED FOR SLEEP, Disp: 90 tablet, Rfl: 0 .  venlafaxine XR (EFFEXOR-XR) 150 MG 24 hr capsule, TAKE 1 CAPSULE BY MOUTH EVERY DAY WITH BREAKFAST, Disp: 30 capsule, Rfl: 2 .  HYDROcodone-acetaminophen (NORCO/VICODIN) 5-325 MG tablet, Take 1 tablet by mouth every 6 (six) hours as needed for moderate pain., Disp: 60 tablet, Rfl: 0 .  HYDROcodone-acetaminophen (NORCO/VICODIN) 5-325 MG tablet, Take 1 tablet by mouth every 6 (six) hours as needed for moderate pain., Disp: 60 tablet, Rfl: 0 .  HYDROcodone-acetaminophen (NORCO/VICODIN) 5-325 MG tablet, Take 1 tablet by mouth every 6 (six) hours as needed for moderate pain., Disp: 60 tablet, Rfl: 0  Review of Systems:  Constitutional: Denies fever, chills, diaphoresis, appetite change. HEENT: Denies photophobia, eye pain, redness, hearing loss, ear pain, congestion, sore throat,  rhinorrhea, sneezing, mouth sores, trouble swallowing, neck pain, neck stiffness and tinnitus.   Respiratory: Denies SOB, DOE, cough, chest tightness,  and wheezing.   Cardiovascular: Denies chest pain, palpitations and leg swelling.  Gastrointestinal: Denies nausea, vomiting, abdominal pain, diarrhea, constipation, blood in stool and abdominal distention.  Genitourinary: Denies dysuria, urgency, frequency, hematuria, flank pain and difficulty urinating.  Endocrine: Denies: hot or cold intolerance, sweats, changes in hair or nails, polyuria, polydipsia. Musculoskeletal: Denies myalgias, back pain, joint swelling, arthralgias and gait problem.  Skin: Denies pallor, rash and wound.  Neurological: Denies dizziness, seizures, syncope, weakness, light-headedness, numbness and headaches.  Hematological: Denies adenopathy. Easy bruising, personal or family bleeding history  Psychiatric/Behavioral: Denies suicidal ideation,  confusion, nervousness.   Physical Exam: Vitals:   01/23/21 0802  BP: 130/80  Pulse: (!) 52  Temp: 98.3 F (36.8 C)  TempSrc: Oral  SpO2: 94%  Weight: 237 lb 12.8 oz (107.9 kg)    Body mass index is 39.57 kg/m.  Constitutional: NAD, calm, comfortable Eyes: PERRL, lids and conjunctivae normal, wears corrective lenses ENMT: Mucous membranes are moist.  Respiratory: clear to auscultation bilaterally, no wheezing, no crackles. Normal respiratory effort. No accessory muscle use.  Cardiovascular: Regular rate and rhythm, no murmurs / rubs / gallops. No extremity edema. 2+ pedal pulses.   Neurologic: Grossly intact and nonfocal Psychiatric: Normal judgment and insight. Alert and oriented x 3. Normal mood.    Impression and Plan:  Gastroesophageal reflux disease, unspecified whether esophagitis present -Stable on daily Protonix 40 mg.  Generalized osteoarthritis of multiple sites Chronic pain disorder  -PDMP reviewed, no red flags, overdose risk score is 60. -Refill  hydrocodone 5/325 mg to take 1 tablet every 6 hours as needed for pain limited to 60 tablets a month x3 months.  Vitamin D deficiency -Recheck vitamin D when she returns for CPE.  Ductal carcinoma in situ (DCIS) of left breast -Noted, followed by Dr. Jana Hakim.  Morbid obesity (HCC)  OSA (obstructive sleep apnea) -Noted, on nightly CPAP.  Essential hypertension -Well-controlled  Hyperlipidemia, acquired -Continue atorvastatin 20 mg daily, her last LDL was 104 in May 2021.  Anxiety and depression   Flowsheet Row Office Visit from 01/23/2021 in  [facility]  PHQ-9 Total Score 8     -Increase Wellbutrin from 150 to 300 mg daily, continue venlafaxine 150 mg daily and trazodone 50 mg at nighttime. -Have given her information and strongly encouraged her to pursue CBT sessions.  Balance disorder  Frequent falls -She will be referred to outpatient physical therapy for evaluation and treatment.     Lelon Frohlich, MD [facility]

## 2021-01-31 DIAGNOSIS — G4733 Obstructive sleep apnea (adult) (pediatric): Secondary | ICD-10-CM | POA: Diagnosis not present

## 2021-02-15 ENCOUNTER — Other Ambulatory Visit: Payer: Self-pay | Admitting: *Deleted

## 2021-02-15 MED ORDER — CENTRUM ADULTS PO TABS: 1.0000 | ORAL_TABLET | Freq: Every day | ORAL | 1 refills | Status: DC

## 2021-02-18 ENCOUNTER — Ambulatory Visit: Payer: Medicare HMO | Admitting: Physical Therapy

## 2021-02-19 ENCOUNTER — Other Ambulatory Visit: Payer: Self-pay | Admitting: Internal Medicine

## 2021-02-19 DIAGNOSIS — R600 Localized edema: Secondary | ICD-10-CM

## 2021-03-03 DIAGNOSIS — G4733 Obstructive sleep apnea (adult) (pediatric): Secondary | ICD-10-CM | POA: Diagnosis not present

## 2021-03-05 DIAGNOSIS — H5213 Myopia, bilateral: Secondary | ICD-10-CM | POA: Diagnosis not present

## 2021-03-19 DIAGNOSIS — H2513 Age-related nuclear cataract, bilateral: Secondary | ICD-10-CM | POA: Diagnosis not present

## 2021-03-19 DIAGNOSIS — H25043 Posterior subcapsular polar age-related cataract, bilateral: Secondary | ICD-10-CM | POA: Diagnosis not present

## 2021-03-19 DIAGNOSIS — H25013 Cortical age-related cataract, bilateral: Secondary | ICD-10-CM | POA: Diagnosis not present

## 2021-03-19 DIAGNOSIS — H18413 Arcus senilis, bilateral: Secondary | ICD-10-CM | POA: Diagnosis not present

## 2021-03-19 DIAGNOSIS — H2511 Age-related nuclear cataract, right eye: Secondary | ICD-10-CM | POA: Diagnosis not present

## 2021-03-21 ENCOUNTER — Other Ambulatory Visit: Payer: Self-pay | Admitting: Internal Medicine

## 2021-03-21 ENCOUNTER — Other Ambulatory Visit: Payer: Self-pay | Admitting: Oncology

## 2021-03-21 DIAGNOSIS — E559 Vitamin D deficiency, unspecified: Secondary | ICD-10-CM

## 2021-04-12 DIAGNOSIS — G4733 Obstructive sleep apnea (adult) (pediatric): Secondary | ICD-10-CM | POA: Diagnosis not present

## 2021-04-15 ENCOUNTER — Ambulatory Visit: Payer: Medicare HMO | Admitting: Pulmonary Disease

## 2021-04-18 ENCOUNTER — Encounter: Payer: Self-pay | Admitting: Adult Health

## 2021-04-18 ENCOUNTER — Other Ambulatory Visit: Payer: Self-pay

## 2021-04-18 ENCOUNTER — Ambulatory Visit: Payer: Medicare HMO | Admitting: Adult Health

## 2021-04-18 DIAGNOSIS — I10 Essential (primary) hypertension: Secondary | ICD-10-CM | POA: Diagnosis not present

## 2021-04-18 DIAGNOSIS — G4700 Insomnia, unspecified: Secondary | ICD-10-CM | POA: Diagnosis not present

## 2021-04-18 DIAGNOSIS — G4730 Sleep apnea, unspecified: Secondary | ICD-10-CM | POA: Diagnosis not present

## 2021-04-18 MED ORDER — TRAZODONE HCL 50 MG PO TABS: 50.0000 mg | ORAL_TABLET | Freq: Every evening | ORAL | 1 refills | Status: DC | PRN

## 2021-04-18 NOTE — Progress Notes (Signed)
@Patient  ID: Tamara [name], female    DOB: Dec 03, 1950, 70 y.o.   MRN: [id number]  Chief Complaint  Patient presents with  . Follow-up    Referring provider: Leotis Shames*  HPI: 70 year old female followed for obstructive sleep apnea and insomnia Medical history significant for morbid obesity status post gastric sleeve surgery in 2016  TEST/EVENTS :  PSG 03/2013-291 lbs-[name] OSA with AHI 66/hour which was corrected with CPAP of 9 cm.  She also had moderate PLM's 40/hour, however PLM arousal index was low at 0.9/hour.  05/2018 HST mild AHI 13/h , predom supine  04/18/2021 Follow up : OSA and Insomnia  Patient returns for a 95-month follow-up.  Patient has underlying sleep apnea is on CPAP at nighttime.  Patient says she is doing well on CPAP.  She says she wears her CPAP every night.  She is unable to sleep without it.  She feels that she benefits from CPAP and has no significant daytime sleepiness.  CPAP download shows excellent compliance with daily average usage at 9.5 hours.  Patient is on CPAP 9 cm H2O.  AHI is 4.9.  Positive leaks.  Patient says she wears a fullface mask.  Does not notice that it leaks.  Patient does have chronic insomnia.  She uses trazodone most each night.  Patient education on cautious use of sedating medications.  Discussed helpful hints for healthy sleep regimen.  Patient says she is having no problems with trazodone.  Does need a refill.  Patient says she is been under some stress lately.  She is having to move out of her apartment due to management issues.    No Known Allergies  Immunization History  Administered Date(s) Administered  . Fluad Quad(high Dose 65+) 08/31/2019, 10/11/2020  . Influenza Split 08/09/2012  . Influenza, High Dose Seasonal PF 10/22/2015, 09/09/2016, 09/14/2017, 09/10/2018  . Influenza,inj,Quad PF,6+ Mos 09/27/2013, 10/02/2014  . Influenza,inj,quad, With Preservative 07/01/2017  . Janssen (J&J) SARS-COV-2  Vaccination 01/15/2020  . PFIZER(Purple Top)SARS-COV-2 Vaccination 11/12/2020  . Pneumococcal Conjugate-13 10/22/2015, 07/01/2017  . Pneumococcal Polysaccharide-23 02/16/2009, 10/26/2019  . Td 07/09/2006  . Tdap 09/09/2016  . Zoster 08/09/2012  . Zoster Recombinat (Shingrix) 01/14/2021    Past Medical History:  Diagnosis Date  . Allergy    seasonal allergies ([place])  . Anxiety    on meds  . Arthritis    knees   . Asthma    rare;only when around alot of dust-Ventolin inhaler as needed  . Breast cancer ([place])     left. States she did not have lymph nodes removed  . Bursitis of right shoulder   . Cataract    not had sx as of yet  . Cellulitis and abscess of leg   . Complication of anesthesia    was told 01/06/14 that airway was small  . Depression    takes Effexor and Wellbutrin daily  . GERD (gastroesophageal reflux disease)    on meds  . Hearing loss   . History of bronchitis 1966  . Hyperlipidemia    on meds  . Hypertension    takes Coreg and Losartan daily  . Joint pain   . Joint swelling   . Neuromuscular disorder (HCC)    RLS  . Obese   . Peripheral edema    takes Furosemide daily as needed and Potassium daily  . Personal history of radiation therapy 2015  . Radiation 03/08/14-04/26/14   50.4 gray to left breast. Lumpectomy cavity boosted to 64.4 gray  .  Restless legs syndrome    takes depakote  . Sleep apnea    uses CPAP  . Sleep apnea, obstructive    uses CPAP  . Wears glasses     Tobacco History: Social History   Tobacco Use  Smoking Status Never Smoker  Smokeless Tobacco Never Used   Counseling given: Not Answered   Outpatient Medications Prior to Visit  Medication Sig Dispense Refill  . albuterol (VENTOLIN HFA) 108 (90 Base) MCG/ACT inhaler Inhale 2 puffs into the lungs every 6 (six) hours as needed for wheezing or shortness of breath. 36 each 2  . amLODipine (NORVASC) 2.5 MG tablet TAKE 1 TABLET BY MOUTH DAILY 90 tablet 1  . ASPIRIN LOW DOSE  81 MG EC tablet TAKE 1 TABLET BY MOUTH AT BEDTIME 30 tablet 8  . atorvastatin (LIPITOR) 20 MG tablet Take 1 tablet (20 mg total) by mouth daily. (Patient taking differently: Take 20 mg by mouth every evening.) 90 tablet 3  . buPROPion (WELLBUTRIN XL) 300 MG 24 hr tablet Take 1 tablet (300 mg total) by mouth daily. 90 tablet 1  . carvedilol (COREG) 12.5 MG tablet TAKE 1 TABLET BY MOUTH TWICE A DAY WITH A MEAL 180 tablet 1  . Cholecalciferol (VITAMIN D3) 125 MCG (5000 UT) TABS TAKE 1 TABLET BY MOUTH EVERY DAY 30 tablet 8  . clindamycin (CLINDAGEL) 1 % gel Apply topically 2 (two) times daily. (Patient taking differently: Apply 1 application topically 2 (two) times daily as needed (rash).) 30 g 0  . furosemide (LASIX) 40 MG tablet TAKE 1/2 TABLET BY MOUTH DAILY AS NEEDED FOR EDEMA 15 tablet 8  . HYDROcodone-acetaminophen (NORCO/VICODIN) 5-325 MG tablet Take 1 tablet by mouth every 6 (six) hours as needed for moderate pain. 60 tablet 0  . LINZESS 290 MCG CAPS capsule TAKE 1 CAPSULE BY MOUTH EVERY DAY 90 capsule 2  . losartan (COZAAR) 100 MG tablet TAKE 1 TABLET BY MOUTH EVERY DAY 90 tablet 1  . meclizine (ANTIVERT) 25 MG tablet TAKE 1 TABLET BY MOUTH DAILY AS NEEDED FOR DIZZINESS 30 tablet 2  . Multiple Vitamins-Minerals (CENTRUM ADULTS) TABS Take 1 tablet by mouth daily. 90 tablet 1  . pantoprazole (PROTONIX) 40 MG tablet TAKE 1 TABLET BY MOUTH EVERY DAY 90 tablet 1  . venlafaxine XR (EFFEXOR-XR) 150 MG 24 hr capsule TAKE 1 CAPSULE BY MOUTH EVERY DAY WITH BREAKFAST 30 capsule 2  . traZODone (DESYREL) 50 MG tablet TAKE 1 TABLET BY MOUTH EVERY DAY AT BEDTIME AS NEEDED FOR SLEEP 90 tablet 0  . HYDROcodone-acetaminophen (NORCO/VICODIN) 5-325 MG tablet Take 1 tablet by mouth every 6 (six) hours as needed for moderate pain. 60 tablet 0  . HYDROcodone-acetaminophen (NORCO/VICODIN) 5-325 MG tablet Take 1 tablet by mouth every 6 (six) hours as needed for moderate pain. 60 tablet 0   No facility-administered  medications prior to visit.     Review of Systems:   Constitutional:   No  weight loss, night sweats,  Fevers, chills,  +fatigue, or  lassitude.  HEENT:   No headaches,  Difficulty swallowing,  Tooth/dental problems, or  Sore throat,                No sneezing, itching, ear ache, nasal congestion, post nasal drip,   CV:  No chest pain,  Orthopnea, PND, swelling in lower extremities, anasarca, dizziness, palpitations, syncope.   GI  No heartburn, indigestion, abdominal pain, nausea, vomiting, diarrhea, change in bowel habits, loss of appetite, bloody stools.  Resp: No shortness of breath with exertion or at rest.  No excess mucus, no productive cough,  No non-productive cough,  No coughing up of blood.  No change in color of mucus.  No wheezing.  No chest wall deformity  Skin: no rash or lesions.  GU: no dysuria, change in color of urine, no urgency or frequency.  No flank pain, no hematuria   MS:  No joint pain or swelling.  No decreased range of motion.  No back pain.    Physical Exam  BP (!) 164/92 (BP Location: Left Arm, Patient Position: Sitting, Cuff Size: Normal)   Pulse 67   Temp (!) 97.4 F (36.3 C) (Skin)   Resp (!) 22   Ht 5\' 5"  (1.651 m)   Wt 237 lb 12.8 oz (107.9 kg)   SpO2 97%   BMI 39.57 kg/m   GEN: A/Ox3; pleasant , NAD, well nourished    HEENT:  [place]/AT,   NOSE-clear, THROAT-clear, no lesions, no postnasal drip or exudate noted.   NECK:  Supple w/ fair ROM; no JVD; normal carotid impulses w/o bruits; no thyromegaly or nodules palpated; no lymphadenopathy.    RESP  Clear  P & A; w/o, wheezes/ rales/ or rhonchi. no accessory muscle use, no dullness to percussion  CARD:  RRR, no m/r/g, tr  peripheral edema, pulses intact, no cyanosis or clubbing.  GI:   Soft & nt; nml bowel sounds; no organomegaly or masses detected.   Musco: Warm bil, no deformities or joint swelling noted.   Neuro: alert, no focal deficits noted.    Skin: Warm, no lesions or  rashes    Lab Results:  CBC  BMET  BNP No results found for: BNP  ProBNP No results found for: PROBNP  Imaging: No results found.    No flowsheet data found.  No results found for: NITRICOXIDE      Assessment & Plan:   Sleep apnea Excellent control and compliance on nocturnal CPAP  Plan  Patient Instructions  Keep up good work  Continue on CPAP At bedtime   Work on healthy weight  Do not drive if sleepy  Trazodone 50mg  At bedtime  For sleep , use with caution  Discuss with PCP that your blood pressure is elevated.  Follow up in 1 year and As needed        Insomnia Healthy sleep regimen.  May use trazodone as needed for insomnia.  Patient education given on cautious use with sedating medications  Class 2 obesity with body mass index (BMI) of 36.0 to 36.9 in adult Healthy weight loss discussed  Essential hypertension Blood pressure is elevated today.  Patient says her blood pressures been doing well at home.  She says a lot of times is is elevated at the doctor's office.  Patient is advised to follow-up with primary care provider for further management.  She denies any headache, dizziness or chest pain.  Exam is unrevealing     Rexene Edison, NP 04/18/2021

## 2021-04-18 NOTE — Assessment & Plan Note (Signed)
Healthy sleep regimen.  May use trazodone as needed for insomnia.  Patient education given on cautious use with sedating medications

## 2021-04-18 NOTE — Patient Instructions (Addendum)
Keep up good work  Continue on CPAP At bedtime   Work on healthy weight  Do not drive if sleepy  Trazodone 50mg  At bedtime  For sleep , use with caution  Discuss with PCP that your blood pressure is elevated.  Follow up in 1 year with Dr. Elsworth Soho  And As needed

## 2021-04-18 NOTE — Assessment & Plan Note (Signed)
Blood pressure is elevated today.  Patient says her blood pressures been doing well at home.  She says a lot of times is is elevated at the doctor's office.  Patient is advised to follow-up with primary care provider for further management.  She denies any headache, dizziness or chest pain.  Exam is unrevealing

## 2021-04-18 NOTE — Assessment & Plan Note (Signed)
Healthy weight loss discussed 

## 2021-04-18 NOTE — Assessment & Plan Note (Signed)
Excellent control and compliance on nocturnal CPAP  Plan  Patient Instructions  Keep up good work  Continue on CPAP At bedtime   Work on healthy weight  Do not drive if sleepy  Trazodone 50mg  At bedtime  For sleep , use with caution  Discuss with PCP that your blood pressure is elevated.  Follow up in 1 year and As needed

## 2021-04-19 ENCOUNTER — Other Ambulatory Visit: Payer: Self-pay | Admitting: Internal Medicine

## 2021-04-19 DIAGNOSIS — I10 Essential (primary) hypertension: Secondary | ICD-10-CM

## 2021-04-19 DIAGNOSIS — K219 Gastro-esophageal reflux disease without esophagitis: Secondary | ICD-10-CM

## 2021-04-23 ENCOUNTER — Other Ambulatory Visit: Payer: Self-pay

## 2021-04-24 ENCOUNTER — Ambulatory Visit (INDEPENDENT_AMBULATORY_CARE_PROVIDER_SITE_OTHER): Payer: Medicare HMO | Admitting: Internal Medicine

## 2021-04-24 ENCOUNTER — Encounter: Payer: Self-pay | Admitting: Internal Medicine

## 2021-04-24 VITALS — BP 140/90 | HR 67 | Temp 98.2°F | Wt 236.7 lb

## 2021-04-24 DIAGNOSIS — M48061 Spinal stenosis, lumbar region without neurogenic claudication: Secondary | ICD-10-CM

## 2021-04-24 DIAGNOSIS — G4700 Insomnia, unspecified: Secondary | ICD-10-CM

## 2021-04-24 DIAGNOSIS — F32A Depression, unspecified: Secondary | ICD-10-CM

## 2021-04-24 DIAGNOSIS — F419 Anxiety disorder, unspecified: Secondary | ICD-10-CM

## 2021-04-24 DIAGNOSIS — I10 Essential (primary) hypertension: Secondary | ICD-10-CM

## 2021-04-24 DIAGNOSIS — R69 Illness, unspecified: Secondary | ICD-10-CM | POA: Diagnosis not present

## 2021-04-24 NOTE — Progress Notes (Signed)
Established Patient Office Visit     This visit occurred during the SARS-CoV-2 public health emergency.  Safety protocols were in place, including screening questions prior to the visit, additional usage of staff PPE, and extensive cleaning of exam room while observing appropriate contact time as indicated for disinfecting solutions.    CC/Reason for Visit: Follow-up chronic conditions, medication refills  HPI: Aggie Douse is a 70 y.o. female who is coming in today for the above mentioned reasons. Past Medical History is significant for: Morbid obesity, hypertension, GERD, hyperlipidemia, vitamin D deficiency, prior history of breast cancer, history of obstructive sleep apnea on CPAP.  She tells me that she is in the process of moving into a senior living facility, this has her very anxious, she believes this is why her blood pressure is a little elevated today.  She is not quite due for her hydrocodone refills yet but will be due in a few weeks.  She tells me she is having cataract surgery in June  and 2 weeks later the second eye.  She is otherwise doing well.   Past Medical/Surgical History: Past Medical History:  Diagnosis Date  . Allergy    seasonal allergies (Fall)  . Anxiety    on meds  . Arthritis    knees   . Asthma    rare;only when around alot of dust-Ventolin inhaler as needed  . Breast cancer ([place])     left. States she did not have lymph nodes removed  . Bursitis of right shoulder   . Cataract    not had sx as of yet  . Cellulitis and abscess of leg   . Complication of anesthesia    was told 01/06/14 that airway was small  . Depression    takes Effexor and Wellbutrin daily  . GERD (gastroesophageal reflux disease)    on meds  . Hearing loss   . History of bronchitis 1966  . Hyperlipidemia    on meds  . Hypertension    takes Coreg and Losartan daily  . Joint pain   . Joint swelling   . Neuromuscular disorder (HCC)    RLS  . Obese   . Peripheral  edema    takes Furosemide daily as needed and Potassium daily  . Personal history of radiation therapy 2015  . Radiation 03/08/14-04/26/14   50.4 gray to left breast. Lumpectomy cavity boosted to 64.4 gray  . Restless legs syndrome    takes depakote  . Sleep apnea    uses CPAP  . Sleep apnea, obstructive    uses CPAP  . Wears glasses     Past Surgical History:  Procedure Laterality Date  . BREAST LUMPECTOMY Left 2015  . BREAST LUMPECTOMY WITH NEEDLE LOCALIZATION Left 01/24/2014   Procedure: BREAST LUMPECTOMY WITH NEEDLE LOCALIZATION;  Surgeon: Edward Jolly, MD;  Location: [place];  Service: General;  Laterality: Left;  . COLONOSCOPY N/A 03/20/2017   Procedure: COLONOSCOPY;  Surgeon: Mauri Pole, MD;  [facility];  Service: Endoscopy;  Laterality: N/A;  . COLONOSCOPY  2018  . COLONOSCOPY WITH PROPOFOL N/A 08/21/2020   Procedure: COLONOSCOPY WITH PROPOFOL;  Surgeon: Mauri Pole, MD;  [facility];  Service: Endoscopy;  Laterality: N/A;  . DILATATION & CURETTAGE/HYSTEROSCOPY WITH TRUECLEAR N/A 01/06/2014   Procedure: DILATATION & CURETTAGE/HYSTEROSCOPY WITH TRUCLEAR;  Surgeon: Shon Millet II, MD;  Location: [facility];  Service: Gynecology;  Laterality: N/A;  . HYSTEROPLASTY  01/2014  .  INNER EAR SURGERY Bilateral    for hearing loss  . JOINT REPLACEMENT Right    Knee  . LAPAROSCOPIC GASTRIC SLEEVE RESECTION N/A 01/28/2016   Procedure: LAPAROSCOPIC GASTRIC SLEEVE RESECTION WITH UPPER ENDO;  Surgeon: Excell Seltzer, MD;  [facility];  Service: General;  Laterality: N/A;  . LIPOSUCTION WITH LIPOFILLING Bilateral 06/20/2019   Procedure: BILATERAL THIGH LIPECTOMY;  Surgeon: Irene Limbo, MD;  Location: [place];  Service: Plastics;  Laterality: Bilateral;  . PANNICULECTOMY N/A 06/18/2018   Procedure: PANNICULECTOMY;  Surgeon: Irene Limbo, MD;  Location: [place];  Service: Plastics;  Laterality: N/A;  . POLYPECTOMY  08/21/2020    Procedure: POLYPECTOMY;  Surgeon: Mauri Pole, MD;  [facility];  Service: Endoscopy;;  . RE-EXCISION OF BREAST LUMPECTOMY Left 02/02/2014   Procedure: RE-EXCISION OF LEFT BREAST LUMPECTOMY;  Surgeon: Edward Jolly, MD;  Location: [place];  Service: General;  Laterality: Left;  . TUBAL LIGATION    . [place] EXTRACTION  1970    Social History:  reports that she has never smoked. She has never used smokeless tobacco. She reports that she does not drink alcohol and does not use drugs.  Allergies: No Known Allergies  Family History:  Family History  Problem Relation Age of Onset  . Dementia Mother 26  . Heart attack Mother 57  . Esophageal cancer Father 48  . Heart attack Father 12  . Stomach cancer Father 80  . Colon polyps Brother   . Asthma Other   . Hypertension Other   . Thyroid disease Other   . Heart attack Other   . Throat cancer Paternal Grandfather   . Colon cancer Neg Hx   . Rectal cancer Neg Hx      Current Outpatient Medications:  .  albuterol (VENTOLIN HFA) 108 (90 Base) MCG/ACT inhaler, Inhale 2 puffs into the lungs every 6 (six) hours as needed for wheezing or shortness of breath., Disp: 36 each, Rfl: 2 .  amLODipine (NORVASC) 2.5 MG tablet, TAKE 1 TABLET BY MOUTH DAILY, Disp: 90 tablet, Rfl: 1 .  ASPIRIN LOW DOSE 81 MG EC tablet, TAKE 1 TABLET BY MOUTH AT BEDTIME, Disp: 30 tablet, Rfl: 8 .  atorvastatin (LIPITOR) 20 MG tablet, Take 1 tablet (20 mg total) by mouth daily. (Patient taking differently: Take 20 mg by mouth every evening.), Disp: 90 tablet, Rfl: 3 .  buPROPion (WELLBUTRIN XL) 300 MG 24 hr tablet, Take 1 tablet (300 mg total) by mouth daily., Disp: 90 tablet, Rfl: 1 .  carvedilol (COREG) 12.5 MG tablet, TAKE 1 TABLET BY MOUTH TWICE A DAY WITH MEALS, Disp: 180 tablet, Rfl: 1 .  Cholecalciferol (VITAMIN D3) 125 MCG (5000 UT) TABS, TAKE 1 TABLET BY MOUTH EVERY DAY, Disp: 30 tablet, Rfl: 8 .  clindamycin (CLINDAGEL) 1 % gel,  Apply topically 2 (two) times daily. (Patient taking differently: Apply 1 application topically 2 (two) times daily as needed (rash).), Disp: 30 g, Rfl: 0 .  furosemide (LASIX) 40 MG tablet, TAKE 1/2 TABLET BY MOUTH DAILY AS NEEDED FOR EDEMA, Disp: 15 tablet, Rfl: 8 .  HYDROcodone-acetaminophen (NORCO/VICODIN) 5-325 MG tablet, Take 1 tablet by mouth every 6 (six) hours as needed for moderate pain., Disp: 60 tablet, Rfl: 0 .  LINZESS 290 MCG CAPS capsule, TAKE 1 CAPSULE BY MOUTH EVERY DAY, Disp: 90 capsule, Rfl: 2 .  losartan (COZAAR) 100 MG tablet, TAKE 1 TABLET BY MOUTH EVERY DAY, Disp: 90 tablet, Rfl: 1 .  meclizine (ANTIVERT) 25 MG tablet, TAKE 1 TABLET BY MOUTH DAILY AS NEEDED FOR DIZZINESS, Disp: 30 tablet, Rfl: 2 .  Multiple Vitamins-Minerals (CENTRUM ADULTS) TABS, Take 1 tablet by mouth daily., Disp: 90 tablet, Rfl: 1 .  pantoprazole (PROTONIX) 40 MG tablet, TAKE 1 TABLET BY MOUTH EVERY DAY, Disp: 90 tablet, Rfl: 1 .  traZODone (DESYREL) 50 MG tablet, Take 1 tablet (50 mg total) by mouth at bedtime as needed for sleep., Disp: 90 tablet, Rfl: 1 .  venlafaxine XR (EFFEXOR-XR) 150 MG 24 hr capsule, TAKE 1 CAPSULE BY MOUTH EVERY DAY WITH BREAKFAST, Disp: 30 capsule, Rfl: 2  Review of Systems:  Constitutional: Denies fever, chills, diaphoresis, appetite change and fatigue.  HEENT: Denies photophobia, eye pain, redness, hearing loss, ear pain, congestion, sore throat, rhinorrhea, sneezing, mouth sores, trouble swallowing, neck pain, neck stiffness and tinnitus.   Respiratory: Denies SOB, DOE, cough, chest tightness,  and wheezing.   Cardiovascular: Denies chest pain, palpitations and leg swelling.  Gastrointestinal: Denies nausea, vomiting, abdominal pain, diarrhea, constipation, blood in stool and abdominal distention.  Genitourinary: Denies dysuria, urgency, frequency, hematuria, flank pain and difficulty urinating.  Endocrine: Denies: hot or cold intolerance, sweats, changes in hair or nails,  polyuria, polydipsia. Musculoskeletal: Denies myalgias, back pain, joint swelling, arthralgias and gait problem.  Skin: Denies pallor, rash and wound.  Neurological: Denies dizziness, seizures, syncope, weakness, light-headedness, numbness and headaches.  Hematological: Denies adenopathy. Easy bruising, personal or family bleeding history  Psychiatric/Behavioral: Denies suicidal ideation, mood changes, confusion, nervousness, sleep disturbance and agitation    Physical Exam: Vitals:   04/24/21 0726  BP: 140/90  Pulse: 67  Temp: 98.2 F (36.8 C)  TempSrc: Oral  SpO2: 97%  Weight: 236 lb 11.2 oz (107.4 kg)    Body mass index is 39.39 kg/m.   Constitutional: NAD, calm, comfortable, obese, ambulates with a cane Eyes: PERRL, lids and conjunctivae normal, wears corrective lenses ENMT: Mucous membranes are moist.  Respiratory: clear to auscultation bilaterally, no wheezing, no crackles. Normal respiratory effort. No accessory muscle use.  Cardiovascular: Regular rate and rhythm, no murmurs / rubs / gallops. No extremity edema. Neurologic: Grossly intact and nonfocal Psychiatric: Normal judgment and insight. Alert and oriented x 3. Normal mood.    Impression and Plan:  Spinal stenosis of lumbar region, unspecified whether neurogenic claudication present -PDMP reviewed, no red flags, overdose risk score is 70.   -She is on hydrocodone 5/325 mg that she is allowed to take every 6 hours as needed for pain limited to 60 tablets a month.  She has not due for refills today.  Insomnia, unspecified type -On trazodone.  Anxiety and depression -Mood is currently stable on Wellbutrin 300 mg, Effexor 450 mg daily.  Essential hypertension -Blood pressure is elevated today to 140/90, she believes it is due to anxiety with moving, she will continue to do ambulatory blood pressure monitoring and return in 3 months for follow-up. -She is currently on Norvasc 2.5 mg, Coreg 12.5 mg twice daily,  Lasix 40 mg daily and losartan 100 mg daily.  Morbid obesity ([place]) -Discussed healthy lifestyle, including increased physical activity and better food choices to promote weight loss.     Lelon Frohlich, MD [facility] at [facility]

## 2021-04-29 ENCOUNTER — Other Ambulatory Visit: Payer: Self-pay | Admitting: Internal Medicine

## 2021-04-29 DIAGNOSIS — G894 Chronic pain syndrome: Secondary | ICD-10-CM

## 2021-04-29 DIAGNOSIS — M159 Polyosteoarthritis, unspecified: Secondary | ICD-10-CM

## 2021-04-30 MED ORDER — HYDROCODONE-ACETAMINOPHEN 5-325 MG PO TABS: 1.0000 | ORAL_TABLET | Freq: Four times a day (QID) | ORAL | 0 refills | Status: DC | PRN

## 2021-05-06 ENCOUNTER — Encounter: Payer: Self-pay | Admitting: Internal Medicine

## 2021-05-06 DIAGNOSIS — H2511 Age-related nuclear cataract, right eye: Secondary | ICD-10-CM | POA: Diagnosis not present

## 2021-05-06 DIAGNOSIS — J4520 Mild intermittent asthma, uncomplicated: Secondary | ICD-10-CM

## 2021-05-07 DIAGNOSIS — H2512 Age-related nuclear cataract, left eye: Secondary | ICD-10-CM | POA: Diagnosis not present

## 2021-05-07 MED ORDER — ALBUTEROL SULFATE HFA 108 (90 BASE) MCG/ACT IN AERS: 2.0000 | INHALATION_SPRAY | Freq: Four times a day (QID) | RESPIRATORY_TRACT | 1 refills | Status: DC | PRN

## 2021-05-13 DIAGNOSIS — G4733 Obstructive sleep apnea (adult) (pediatric): Secondary | ICD-10-CM | POA: Diagnosis not present

## 2021-05-27 DIAGNOSIS — H2512 Age-related nuclear cataract, left eye: Secondary | ICD-10-CM | POA: Diagnosis not present

## 2021-06-12 DIAGNOSIS — G4733 Obstructive sleep apnea (adult) (pediatric): Secondary | ICD-10-CM | POA: Diagnosis not present

## 2021-06-28 ENCOUNTER — Telehealth: Payer: Self-pay | Admitting: *Deleted

## 2021-06-28 ENCOUNTER — Other Ambulatory Visit: Payer: Self-pay | Admitting: Oncology

## 2021-06-28 ENCOUNTER — Other Ambulatory Visit: Payer: Self-pay | Admitting: Internal Medicine

## 2021-06-28 NOTE — Telephone Encounter (Signed)
This RN noted per refill request for venlafaxine- prescribed by Dr Jana Hakim- that pt does not have a follow up appointment.  Pt was last seen in August 2020 with completion of anti estrogen therapy and possible release from care or pt could be seen through our survivorship clinic.  Appointments made for survivorship with pt canceled by the patient per scheduling.  This RN called the patient to inquire further about appointments here and requested refill.  Obtained identified VM- detailed message left stating need to speak with her regarding refill - and need for follow up if Dr Jana Hakim will be doing them- or need to obtain from her primary MD. Also stated due to need for continued use without disruption in dosing- short term refill will be sent.  This RN requested a return call to confirm if pt wants to follow up here or have further refills through her primary MD. This RN left her name and office number for return call.

## 2021-07-04 ENCOUNTER — Other Ambulatory Visit: Payer: Self-pay | Admitting: Internal Medicine

## 2021-07-04 DIAGNOSIS — M159 Polyosteoarthritis, unspecified: Secondary | ICD-10-CM

## 2021-07-04 DIAGNOSIS — G894 Chronic pain syndrome: Secondary | ICD-10-CM

## 2021-07-05 MED ORDER — HYDROCODONE-ACETAMINOPHEN 5-325 MG PO TABS: 1.0000 | ORAL_TABLET | Freq: Four times a day (QID) | ORAL | 0 refills | Status: DC | PRN

## 2021-07-05 NOTE — Telephone Encounter (Signed)
Not due until 07/31/21 Had an office visit 04/24/21

## 2021-07-10 DIAGNOSIS — G4733 Obstructive sleep apnea (adult) (pediatric): Secondary | ICD-10-CM | POA: Diagnosis not present

## 2021-07-20 ENCOUNTER — Other Ambulatory Visit: Payer: Self-pay | Admitting: Internal Medicine

## 2021-07-20 DIAGNOSIS — F419 Anxiety disorder, unspecified: Secondary | ICD-10-CM

## 2021-07-22 ENCOUNTER — Other Ambulatory Visit: Payer: Self-pay

## 2021-07-23 ENCOUNTER — Ambulatory Visit (INDEPENDENT_AMBULATORY_CARE_PROVIDER_SITE_OTHER): Payer: Medicare HMO | Admitting: Internal Medicine

## 2021-07-23 ENCOUNTER — Encounter: Payer: Self-pay | Admitting: Internal Medicine

## 2021-07-23 VITALS — BP 140/90 | HR 57 | Temp 98.2°F | Wt 235.2 lb

## 2021-07-23 DIAGNOSIS — G894 Chronic pain syndrome: Secondary | ICD-10-CM

## 2021-07-23 DIAGNOSIS — I10 Essential (primary) hypertension: Secondary | ICD-10-CM

## 2021-07-23 DIAGNOSIS — K219 Gastro-esophageal reflux disease without esophagitis: Secondary | ICD-10-CM | POA: Diagnosis not present

## 2021-07-23 DIAGNOSIS — M159 Polyosteoarthritis, unspecified: Secondary | ICD-10-CM | POA: Diagnosis not present

## 2021-07-23 MED ORDER — HYDROCODONE-ACETAMINOPHEN 5-325 MG PO TABS: 1.0000 | ORAL_TABLET | Freq: Four times a day (QID) | ORAL | 0 refills | Status: DC | PRN

## 2021-07-23 MED ORDER — AMLODIPINE BESYLATE 5 MG PO TABS: 5.0000 mg | ORAL_TABLET | Freq: Every day | ORAL | 1 refills | Status: DC

## 2021-07-23 NOTE — Progress Notes (Signed)
Established Patient Office Visit     This visit occurred during the SARS-CoV-2 public health emergency.  Safety protocols were in place, including screening questions prior to the visit, additional usage of staff PPE, and extensive cleaning of exam room while observing appropriate contact time as indicated for disinfecting solutions.    CC/Reason for Visit: Follow-up chronic conditions  HPI: Tamara [name] is a 70 y.o. female who is coming in today for the above mentioned reasons. Past Medical History is significant for: Morbid obesity, hypertension, GERD, hyperlipidemia, vitamin D deficiency, prior history of breast cancer, history of obstructive sleep apnea on CPAP.  She fell about 2 weeks ago while navigating a small hill with her handful of groceries.  This was mechanical in nature, no syncope/loss of consciousness.  She has been having some left abdominal pain and bilateral knee pain as that is where she suffered the impact of the fall.  She is able to ambulate without concerns.  She is due to have refills of her hydrocodone per contract.  She is allowed 60 tablets/month.  We also noticed today that for the second office visit in a row her blood pressure has been 140/90.   Past Medical/Surgical History: Past Medical History:  Diagnosis Date   Allergy    seasonal allergies (Fall)   Anxiety    on meds   Arthritis    knees    Asthma    rare;only when around alot of dust-Ventolin inhaler as needed   Breast cancer ([place])     left. States she did not have lymph nodes removed   Bursitis of right shoulder    Cataract    not had sx as of yet   Cellulitis and abscess of leg    Complication of anesthesia    was told 01/06/14 that airway was small   Depression    takes Effexor and Wellbutrin daily   GERD (gastroesophageal reflux disease)    on meds   Hearing loss    History of bronchitis 1966   Hyperlipidemia    on meds   Hypertension    takes Coreg and Losartan daily    Joint pain    Joint swelling    Neuromuscular disorder (HCC)    RLS   Obese    Peripheral edema    takes Furosemide daily as needed and Potassium daily   Personal history of radiation therapy 2015   Radiation 03/08/14-04/26/14   50.4 gray to left breast. Lumpectomy cavity boosted to 64.4 gray   Restless legs syndrome    takes depakote   Sleep apnea    uses CPAP   Sleep apnea, obstructive    uses CPAP   Wears glasses     Past Surgical History:  Procedure Laterality Date   BREAST LUMPECTOMY Left 2015   BREAST LUMPECTOMY WITH NEEDLE LOCALIZATION Left 01/24/2014   Procedure: BREAST LUMPECTOMY WITH NEEDLE LOCALIZATION;  Surgeon: Edward Jolly, MD;  Location: [place];  Service: General;  Laterality: Left;   COLONOSCOPY N/A 03/20/2017   Procedure: COLONOSCOPY;  Surgeon: Mauri Pole, MD;  [facility];  Service: Endoscopy;  Laterality: N/A;   COLONOSCOPY  2018   COLONOSCOPY WITH PROPOFOL N/A 08/21/2020   Procedure: COLONOSCOPY WITH PROPOFOL;  Surgeon: Mauri Pole, MD;  [facility];  Service: Endoscopy;  Laterality: N/A;   DILATATION & CURETTAGE/HYSTEROSCOPY WITH TRUECLEAR N/A 01/06/2014   Procedure: DILATATION & CURETTAGE/HYSTEROSCOPY WITH TRUCLEAR;  Surgeon: Shon Millet II, MD;  Location: [facility];  Service: Gynecology;  Laterality: N/A;   HYSTEROPLASTY  01/2014   INNER EAR SURGERY Bilateral    for hearing loss   JOINT REPLACEMENT Right    Knee   LAPAROSCOPIC GASTRIC SLEEVE RESECTION N/A 01/28/2016   Procedure: LAPAROSCOPIC GASTRIC SLEEVE RESECTION WITH UPPER ENDO;  Surgeon: Excell Seltzer, MD;  [facility];  Service: General;  Laterality: N/A;   LIPOSUCTION WITH LIPOFILLING Bilateral 06/20/2019   Procedure: BILATERAL THIGH LIPECTOMY;  Surgeon: Irene Limbo, MD;  Location: [place];  Service: Plastics;  Laterality: Bilateral;   PANNICULECTOMY N/A 06/18/2018   Procedure: PANNICULECTOMY;  Surgeon: Irene Limbo, MD;  Location: [place];  Service: Plastics;  Laterality: N/A;   POLYPECTOMY  08/21/2020   Procedure: POLYPECTOMY;  Surgeon: Mauri Pole, MD;  [facility];  Service: Endoscopy;;   RE-EXCISION OF BREAST LUMPECTOMY Left 02/02/2014   Procedure: RE-EXCISION OF LEFT BREAST LUMPECTOMY;  Surgeon: Edward Jolly, MD;  Location: [place];  Service: General;  Laterality: Left;   [place] EXTRACTION  1970    Social History:  reports that she has never smoked. She has never used smokeless tobacco. She reports that she does not drink alcohol and does not use drugs.  Allergies: No Known Allergies  Family History:  Family History  Problem Relation Age of Onset   Dementia Mother 42   Heart attack Mother 29   Esophageal cancer Father 59   Heart attack Father 65   Stomach cancer Father 36   Colon polyps Brother    Asthma Other    Hypertension Other    Thyroid disease Other    Heart attack Other    Throat cancer Paternal Grandfather    Colon cancer Neg Hx    Rectal cancer Neg Hx      Current Outpatient Medications:    albuterol (VENTOLIN HFA) 108 (90 Base) MCG/ACT inhaler, Inhale 2 puffs into the lungs every 6 (six) hours as needed for wheezing or shortness of breath., Disp: 36 each, Rfl: 1   ASPIRIN LOW DOSE 81 MG EC tablet, TAKE 1 TABLET BY MOUTH EVERYDAY AT BEDTIME, Disp: 30 tablet, Rfl: 8   atorvastatin (LIPITOR) 20 MG tablet, Take 1 tablet (20 mg total) by mouth daily. (Patient taking differently: Take 20 mg by mouth every evening.), Disp: 90 tablet, Rfl: 3   buPROPion (WELLBUTRIN XL) 300 MG 24 hr tablet, TAKE 1 TABLET BY MOUTH EVERY DAY, Disp: 90 tablet, Rfl: 1   carvedilol (COREG) 12.5 MG tablet, TAKE 1 TABLET BY MOUTH TWICE A DAY WITH MEALS, Disp: 180 tablet, Rfl: 1   Cholecalciferol (VITAMIN D3) 125 MCG (5000 UT) TABS, TAKE 1 TABLET BY MOUTH EVERY DAY, Disp: 30 tablet, Rfl: 8   clindamycin (CLINDAGEL) 1 % gel, Apply topically 2 (two) times daily.  (Patient taking differently: Apply 1 application topically 2 (two) times daily as needed (rash).), Disp: 30 g, Rfl: 0   furosemide (LASIX) 40 MG tablet, TAKE 1/2 TABLET BY MOUTH DAILY AS NEEDED FOR EDEMA, Disp: 15 tablet, Rfl: 8   LINZESS 290 MCG CAPS capsule, TAKE 1 CAPSULE BY MOUTH EVERY DAY, Disp: 90 capsule, Rfl: 2   losartan (COZAAR) 100 MG tablet, TAKE 1 TABLET BY MOUTH EVERY DAY, Disp: 90 tablet, Rfl: 1   meclizine (ANTIVERT) 25 MG tablet, TAKE 1 TABLET BY MOUTH DAILY AS NEEDED FOR DIZZINESS, Disp: 30 tablet, Rfl: 2   Multiple Vitamins-Minerals (CENTRUM ADULTS) TABS, Take 1  tablet by mouth daily., Disp: 90 tablet, Rfl: 1   pantoprazole (PROTONIX) 40 MG tablet, TAKE 1 TABLET BY MOUTH EVERY DAY, Disp: 90 tablet, Rfl: 1   traZODone (DESYREL) 50 MG tablet, Take 1 tablet (50 mg total) by mouth at bedtime as needed for sleep., Disp: 90 tablet, Rfl: 1   venlafaxine XR (EFFEXOR-XR) 150 MG 24 hr capsule, TAKE 1 CAPSULE BY MOUTH EVERY DAY WITH BREAKFAST, Disp: 30 capsule, Rfl: 2   amLODipine (NORVASC) 5 MG tablet, Take 1 tablet (5 mg total) by mouth daily., Disp: 90 tablet, Rfl: 1   HYDROcodone-acetaminophen (NORCO/VICODIN) 5-325 MG tablet, Take 1 tablet by mouth every 6 (six) hours as needed for moderate pain., Disp: 60 tablet, Rfl: 0   HYDROcodone-acetaminophen (NORCO/VICODIN) 5-325 MG tablet, Take 1 tablet by mouth every 6 (six) hours as needed for moderate pain., Disp: 60 tablet, Rfl: 0   HYDROcodone-acetaminophen (NORCO/VICODIN) 5-325 MG tablet, Take 1 tablet by mouth every 6 (six) hours as needed for moderate pain., Disp: 60 tablet, Rfl: 0  Review of Systems:  Constitutional: Denies fever, chills, diaphoresis, appetite change and fatigue.  HEENT: Denies photophobia, eye pain, redness, hearing loss, ear pain, congestion, sore throat, rhinorrhea, sneezing, mouth sores, trouble swallowing, neck pain, neck stiffness and tinnitus.   Respiratory: Denies SOB, DOE, cough, chest tightness,  and wheezing.    Cardiovascular: Denies chest pain, palpitations and leg swelling.  Gastrointestinal: Denies nausea, vomiting, abdominal pain, diarrhea, constipation, blood in stool and abdominal distention.  Genitourinary: Denies dysuria, urgency, frequency, hematuria, flank pain and difficulty urinating.  Endocrine: Denies: hot or cold intolerance, sweats, changes in hair or nails, polyuria, polydipsia. Musculoskeletal: Denies  joint swelling and gait problem.  Skin: Denies pallor, rash and wound.  Neurological: Denies dizziness, seizures, syncope, weakness, light-headedness, numbness and headaches.  Hematological: Denies adenopathy. Easy bruising, personal or family bleeding history  Psychiatric/Behavioral: Denies suicidal ideation, mood changes, confusion, nervousness, sleep disturbance and agitation    Physical Exam: Vitals:   07/23/21 0834  BP: 140/90  Pulse: (!) 57  Temp: 98.2 F (36.8 C)  TempSrc: Oral  SpO2: 97%  Weight: 235 lb 3.2 oz (106.7 kg)    Body mass index is 39.14 kg/m.   Constitutional: NAD, calm, comfortable Eyes: PERRL, lids and conjunctivae normal, wears corrective lenses ENMT: Mucous membranes are moist.  Respiratory: clear to auscultation bilaterally, no wheezing, no crackles. Normal respiratory effort. No accessory muscle use.  Cardiovascular: Regular rate and rhythm, no murmurs / rubs / gallops. No extremity edema.  Psychiatric: Normal judgment and insight. Alert and oriented x 3. Normal mood.    Impression and Plan:  Chronic pain disorder  Generalized osteoarthritis of multiple sites  -PDMP reviewed, no red flags, overdose risk score is 70. -Refill hydrocodone 5/325 mg to take 1 tablet as needed every 12 hours for total of 60 tablets a month x3 months.  Gastroesophageal reflux disease, unspecified whether esophagitis present -Well-controlled on daily PPI therapy.  Essential hypertension  -Uncontrolled, increase amlodipine from 2.5 to 5 mg daily. -She is  also on carvedilol 12.5 mg twice daily and losartan 100 mg daily.  Time spent: 32 minutes reviewing chart, interviewing and examining patient and formulating plan of care.     Lelon Frohlich, MD [facility] at [facility]

## 2021-07-24 ENCOUNTER — Other Ambulatory Visit: Payer: Self-pay | Admitting: Internal Medicine

## 2021-07-24 DIAGNOSIS — W19XXXA Unspecified fall, initial encounter: Secondary | ICD-10-CM

## 2021-07-29 DIAGNOSIS — M1712 Unilateral primary osteoarthritis, left knee: Secondary | ICD-10-CM | POA: Diagnosis not present

## 2021-07-29 DIAGNOSIS — M545 Low back pain, unspecified: Secondary | ICD-10-CM | POA: Diagnosis not present

## 2021-07-29 DIAGNOSIS — M25561 Pain in right knee: Secondary | ICD-10-CM | POA: Diagnosis not present

## 2021-07-31 DIAGNOSIS — Z0100 Encounter for examination of eyes and vision without abnormal findings: Secondary | ICD-10-CM | POA: Diagnosis not present

## 2021-08-01 DIAGNOSIS — M1712 Unilateral primary osteoarthritis, left knee: Secondary | ICD-10-CM | POA: Diagnosis not present

## 2021-08-06 ENCOUNTER — Encounter (HOSPITAL_BASED_OUTPATIENT_CLINIC_OR_DEPARTMENT_OTHER): Payer: Self-pay

## 2021-08-06 ENCOUNTER — Other Ambulatory Visit: Payer: Self-pay

## 2021-08-06 ENCOUNTER — Emergency Department (HOSPITAL_BASED_OUTPATIENT_CLINIC_OR_DEPARTMENT_OTHER): Payer: Medicare HMO

## 2021-08-06 ENCOUNTER — Encounter: Payer: Self-pay | Admitting: Internal Medicine

## 2021-08-06 ENCOUNTER — Emergency Department (HOSPITAL_BASED_OUTPATIENT_CLINIC_OR_DEPARTMENT_OTHER)
Admission: EM | Admit: 2021-08-06 | Discharge: 2021-08-06 | Disposition: A | Discharge status: Home / Self Care | Payer: Medicare HMO | Attending: Emergency Medicine | Admitting: Emergency Medicine

## 2021-08-06 DIAGNOSIS — Z79899 Other long term (current) drug therapy: Secondary | ICD-10-CM | POA: Diagnosis not present

## 2021-08-06 DIAGNOSIS — Z853 Personal history of malignant neoplasm of breast: Secondary | ICD-10-CM | POA: Insufficient documentation

## 2021-08-06 DIAGNOSIS — Z96641 Presence of right artificial hip joint: Secondary | ICD-10-CM | POA: Insufficient documentation

## 2021-08-06 DIAGNOSIS — M13862 Other specified arthritis, left knee: Secondary | ICD-10-CM | POA: Insufficient documentation

## 2021-08-06 DIAGNOSIS — M1712 Unilateral primary osteoarthritis, left knee: Secondary | ICD-10-CM

## 2021-08-06 DIAGNOSIS — J45909 Unspecified asthma, uncomplicated: Secondary | ICD-10-CM | POA: Diagnosis not present

## 2021-08-06 DIAGNOSIS — Z7982 Long term (current) use of aspirin: Secondary | ICD-10-CM | POA: Insufficient documentation

## 2021-08-06 DIAGNOSIS — M25562 Pain in left knee: Secondary | ICD-10-CM | POA: Diagnosis not present

## 2021-08-06 DIAGNOSIS — I10 Essential (primary) hypertension: Secondary | ICD-10-CM | POA: Diagnosis not present

## 2021-08-06 DIAGNOSIS — Z9012 Acquired absence of left breast and nipple: Secondary | ICD-10-CM | POA: Insufficient documentation

## 2021-08-06 NOTE — ED Triage Notes (Signed)
She reports several recent falls "for no apparent reason". She has seen her pcp just over a week ago for this; and her orthopedixt last Monday (Dr. Mardelle Matte) who informed pt. She needs left knee replacement and he gave her a steroid injection into her left knee. She is in no distress. She is ambulating with a rolling walker, which she states she purchased last Thursday.

## 2021-08-06 NOTE — Telephone Encounter (Signed)
Patient is aware.  Patient is currently in [place] and will go to the ED when she returns to [place].  Awaiting patient's arrival.

## 2021-08-06 NOTE — ED Provider Notes (Signed)
[facility] EMERGENCY DEPT Provider Note   CSN: [id number] Arrival date & time: 08/06/21  1724     History Chief Complaint  Patient presents with   Recent falls    Tamara [name] is a 70 y.o. female.  70 year old female presents with increasing falls.  States that she has history of left knee pathology and was seen by orthopedist a week ago and had a cortisone injection.  States that occasionally she loses her balance.  Has recently purchased a walker to help her ambulate.  Denies any hip pain with this.  No dizziness or lightheadedness with it.  No headache or pain in her chest.  Does have some worsening left knee pain after recent fall 7 days ago.  She is still able to ambulate      Past Medical History:  Diagnosis Date   Allergy    seasonal allergies (Fall)   Anxiety    on meds   Arthritis    knees    Asthma    rare;only when around alot of dust-Ventolin inhaler as needed   Breast cancer ([place])     left. States she did not have lymph nodes removed   Bursitis of right shoulder    Cataract    not had sx as of yet   Cellulitis and abscess of leg    Complication of anesthesia    was told 01/06/14 that airway was small   Depression    takes Effexor and Wellbutrin daily   GERD (gastroesophageal reflux disease)    on meds   Hearing loss    History of bronchitis 1966   Hyperlipidemia    on meds   Hypertension    takes Coreg and Losartan daily   Joint pain    Joint swelling    Neuromuscular disorder ([place])    RLS   Obese    Peripheral edema    takes Furosemide daily as needed and Potassium daily   Personal history of radiation therapy 2015   Radiation 03/08/14-04/26/14   50.4 gray to left breast. Lumpectomy cavity boosted to 64.4 gray   Restless legs syndrome    takes depakote   Sleep apnea    uses CPAP   Sleep apnea, obstructive    uses CPAP   Wears glasses     Patient Active Problem List   Diagnosis Date Noted   History of colonic polyps     Polyp of rectum    Difficult intubation 05/29/2020   Chronic vulvitis 04/20/2020   Status post abdominoplasty 03/13/2020   History of bariatric surgery 03/13/2020   Mixed conductive and sensorineural hearing loss of both ears 10/24/2019   Sensorineural hearing loss (SNHL), bilateral 10/24/2019   Chronic pain disorder 09/10/2018   Compression fracture of thoracic vertebra ([place]) 07/13/2018   Panniculitis 06/18/2018   Asthma, intermittent, uncomplicated [id number]   GERD (gastroesophageal reflux disease) 03/02/2018   Ductal carcinoma in situ (DCIS) of left breast 03/01/2018   Degeneration of lumbar intervertebral disc 12/25/2017   Spinal stenosis of lumbar region 12/25/2017   Intertriginous candidiasis 10/02/2017   Polyp of transverse colon    Special screening for malignant neoplasms, colon    Vitamin D deficiency 02/16/2017   RLS (restless legs syndrome) 09/09/2016   Insomnia 07/11/2016   Anxiety and depression 07/11/2016   Generalized osteoarthritis of multiple sites 07/11/2016   Class 2 obesity with body mass index (BMI) of 36.0 to 36.9 in adult 01/28/2016   Shoulder pain 12/28/2014   Osteopenia 12/28/2014  Hot flashes 08/02/2014   Constipation 08/02/2014   Malignant neoplasm of upper-outer quadrant of left breast in female, estrogen receptor positive ([place]) 03/24/2014   CANDIDIASIS, SKIN 10/15/2009   PURE HYPERCHOLESTEROLEMIA 04/25/2009   Hyperlipidemia, acquired 02/16/2009   Sleep apnea 02/16/2009   Depression 06/18/2007   Essential hypertension 06/18/2007    Past Surgical History:  Procedure Laterality Date   BREAST LUMPECTOMY Left 2015   BREAST LUMPECTOMY WITH NEEDLE LOCALIZATION Left 01/24/2014   Procedure: BREAST LUMPECTOMY WITH NEEDLE LOCALIZATION;  Surgeon: Edward Jolly, MD;  Location: [facility];  Service: General;  Laterality: Left;   COLONOSCOPY N/A 03/20/2017   Procedure: COLONOSCOPY;  Surgeon: Mauri Pole, MD;  [facility];  Service:  Endoscopy;  Laterality: N/A;   COLONOSCOPY  2018   COLONOSCOPY WITH PROPOFOL N/A 08/21/2020   Procedure: COLONOSCOPY WITH PROPOFOL;  Surgeon: Mauri Pole, MD;  [facility];  Service: Endoscopy;  Laterality: N/A;   DILATATION & CURETTAGE/HYSTEROSCOPY WITH TRUECLEAR N/A 01/06/2014   Procedure: DILATATION & CURETTAGE/HYSTEROSCOPY WITH TRUCLEAR;  Surgeon: Shon Millet II, MD;  Location: [facility];  Service: Gynecology;  Laterality: N/A;   HYSTEROPLASTY  01/2014   INNER EAR SURGERY Bilateral    for hearing loss   JOINT REPLACEMENT Right    Knee   LAPAROSCOPIC GASTRIC SLEEVE RESECTION N/A 01/28/2016   Procedure: LAPAROSCOPIC GASTRIC SLEEVE RESECTION WITH UPPER ENDO;  Surgeon: Excell Seltzer, MD;  [facility];  Service: General;  Laterality: N/A;   LIPOSUCTION WITH LIPOFILLING Bilateral 06/20/2019   Procedure: BILATERAL THIGH LIPECTOMY;  Surgeon: Irene Limbo, MD;  Location: [facility];  Service: Plastics;  Laterality: Bilateral;   PANNICULECTOMY N/A 06/18/2018   Procedure: PANNICULECTOMY;  Surgeon: Irene Limbo, MD;  Location: [facility];  Service: Plastics;  Laterality: N/A;   POLYPECTOMY  08/21/2020   Procedure: POLYPECTOMY;  Surgeon: Mauri Pole, MD;  [facility];  Service: Endoscopy;;   RE-EXCISION OF BREAST LUMPECTOMY Left 02/02/2014   Procedure: RE-EXCISION OF LEFT BREAST LUMPECTOMY;  Surgeon: Edward Jolly, MD;  Location: [facility];  Service: General;  Laterality: Left;   TUBAL LIGATION     WISDOM TOOTH EXTRACTION  1970     OB History   No obstetric history on file.     Family History  Problem Relation Age of Onset   Dementia Mother 68   Heart attack Mother 22   Esophageal cancer Father 22   Heart attack Father 72   Stomach cancer Father 68   Colon polyps Brother    Asthma Other    Hypertension Other    Thyroid disease Other    Heart attack Other    Throat cancer Paternal Grandfather    Colon cancer Neg Hx     Rectal cancer Neg Hx     Social History   Tobacco Use   Smoking status: Never   Smokeless tobacco: Never  Vaping Use   Vaping Use: Never used  Substance Use Topics   Alcohol use: No   Drug use: No    Home Medications Prior to Admission medications   Medication Sig Start Date End Date Taking? Authorizing Provider  albuterol (VENTOLIN HFA) 108 (90 Base) MCG/ACT inhaler Inhale 2 puffs into the lungs every 6 (six) hours as needed for wheezing or shortness of breath. 05/07/21   Isaac Bliss, Rayford Halsted, MD  amLODipine (NORVASC) 5 MG tablet Take 1 tablet (5 mg total) by mouth daily. 07/23/21   Isaac Bliss, Rayford Halsted, MD  ASPIRIN LOW DOSE 81 MG EC tablet TAKE 1 TABLET BY MOUTH EVERYDAY AT BEDTIME 06/28/21   Isaac Bliss, Rayford Halsted, MD  atorvastatin (LIPITOR) 20 MG tablet TAKE 1 TABLET BY MOUTH EVERY DAY 07/24/21   Isaac Bliss, Rayford Halsted, MD  buPROPion (WELLBUTRIN XL) 300 MG 24 hr tablet TAKE 1 TABLET BY MOUTH EVERY DAY 07/22/21   Isaac Bliss, Rayford Halsted, MD  carvedilol (COREG) 12.5 MG tablet TAKE 1 TABLET BY MOUTH TWICE A DAY WITH MEALS 04/19/21   Isaac Bliss, Rayford Halsted, MD  Cholecalciferol (VITAMIN D3) 125 MCG (5000 UT) TABS TAKE 1 TABLET BY MOUTH EVERY DAY 03/26/21   Isaac Bliss, Rayford Halsted, MD  clindamycin (CLINDAGEL) 1 % gel Apply topically 2 (two) times daily. Patient taking differently: Apply 1 application topically 2 (two) times daily as needed (rash). 01/08/18   Magrinat, Virgie Dad, MD  furosemide (LASIX) 40 MG tablet TAKE 1/2 TABLET BY MOUTH DAILY AS NEEDED FOR EDEMA 02/19/21   Isaac Bliss, Rayford Halsted, MD  HYDROcodone-acetaminophen (NORCO/VICODIN) 5-325 MG tablet Take 1 tablet by mouth every 6 (six) hours as needed for moderate pain. 07/23/21   Isaac Bliss, Rayford Halsted, MD  HYDROcodone-acetaminophen (NORCO/VICODIN) 5-325 MG tablet Take 1 tablet by mouth every 6 (six) hours as needed for moderate pain. 07/23/21   Isaac Bliss, Rayford Halsted, MD  HYDROcodone-acetaminophen  (NORCO/VICODIN) 5-325 MG tablet Take 1 tablet by mouth every 6 (six) hours as needed for moderate pain. 07/23/21   Isaac Bliss, Rayford Halsted, MD  LINZESS 290 MCG CAPS capsule TAKE 1 CAPSULE BY MOUTH EVERY DAY 11/16/20   Isaac Bliss, Rayford Halsted, MD  losartan (COZAAR) 100 MG tablet TAKE 1 TABLET BY MOUTH EVERY DAY 12/18/20   Isaac Bliss, Rayford Halsted, MD  meclizine (ANTIVERT) 25 MG tablet TAKE 1 TABLET BY MOUTH DAILY AS NEEDED FOR DIZZINESS 06/28/21   Isaac Bliss, Rayford Halsted, MD  Multiple Vitamins-Minerals (SENTRY SENIOR) TABS TAKE 1 TABLET BY MOUTH EVERY DAY 07/24/21   Isaac Bliss, Rayford Halsted, MD  pantoprazole (PROTONIX) 40 MG tablet TAKE 1 TABLET BY MOUTH EVERY DAY 04/19/21   Isaac Bliss, Rayford Halsted, MD  traZODone (DESYREL) 50 MG tablet Take 1 tablet (50 mg total) by mouth at bedtime as needed for sleep. 04/18/21   Parrett, Fonnie Mu, NP  venlafaxine XR (EFFEXOR-XR) 150 MG 24 hr capsule TAKE 1 CAPSULE BY MOUTH EVERY DAY WITH BREAKFAST 06/28/21   Magrinat, Virgie Dad, MD    Allergies    Patient has no known allergies.  Review of Systems   Review of Systems  All other systems reviewed and are negative.  Physical Exam Updated Vital Signs BP (!) 183/89 (BP Location: Left Arm)   Pulse 75   Temp 98.3 F (36.8 C) (Oral)   Resp 18   SpO2 100%   Physical Exam Vitals and nursing note reviewed.  Constitutional:      General: She is not in acute distress.    Appearance: Normal appearance. She is well-developed. She is not toxic-appearing.  HENT:     Head: Normocephalic and atraumatic.  Eyes:     General: Lids are normal.     Conjunctiva/sclera: Conjunctivae normal.     Pupils: Pupils are equal, round, and reactive to light.  Neck:     Thyroid: No thyroid mass.     Trachea: No tracheal deviation.  Cardiovascular:     Rate and Rhythm: Normal rate and regular rhythm.     Heart sounds: Normal heart sounds. No murmur heard.  No gallop.  Pulmonary:     Effort: Pulmonary effort is  normal. No respiratory distress.     Breath sounds: Normal breath sounds. No stridor. No decreased breath sounds, wheezing, rhonchi or rales.  Abdominal:     General: There is no distension.     Palpations: Abdomen is soft.     Tenderness: There is no abdominal tenderness. There is no rebound.  Musculoskeletal:        General: No tenderness. Normal range of motion.     Cervical back: Normal range of motion and neck supple.       Legs:  Skin:    General: Skin is warm and dry.     Findings: No abrasion or rash.  Neurological:     Mental Status: She is alert and oriented to person, place, and time. Mental status is at baseline.     GCS: GCS eye subscore is 4. GCS verbal subscore is 5. GCS motor subscore is 6.     Cranial Nerves: Cranial nerves are intact. No cranial nerve deficit.     Sensory: No sensory deficit.     Motor: Motor function is intact.  Psychiatric:        Attention and Perception: Attention normal.        Speech: Speech normal.        Behavior: Behavior normal.    ED Results / Procedures / Treatments   Labs (all labs ordered are listed, but only abnormal results are displayed) Labs Reviewed - No data to display  EKG None  Radiology No results found.  Procedures Procedures   Medications Ordered in ED Medications - No data to display  ED Course  I have reviewed the triage vital signs and the nursing notes.  Pertinent labs & imaging results that were available during my care of the patient were reviewed by me and considered in my medical decision making (see chart for details).    MDM Rules/Calculators/A&P                           X-ray of knee shows arthritis.  We will follow-up with her orthopedist Final Clinical Impression(s) / ED Diagnoses Final diagnoses:  None    Rx / DC Orders ED Discharge Orders     None        Lacretia Leigh, MD 08/06/21 1924

## 2021-08-07 NOTE — Telephone Encounter (Signed)
Patient arrived at ED

## 2021-08-08 ENCOUNTER — Other Ambulatory Visit: Payer: Self-pay | Admitting: Internal Medicine

## 2021-08-10 DIAGNOSIS — G4733 Obstructive sleep apnea (adult) (pediatric): Secondary | ICD-10-CM | POA: Diagnosis not present

## 2021-08-12 ENCOUNTER — Other Ambulatory Visit: Payer: Self-pay | Admitting: Internal Medicine

## 2021-08-12 DIAGNOSIS — J4520 Mild intermittent asthma, uncomplicated: Secondary | ICD-10-CM

## 2021-08-21 ENCOUNTER — Encounter: Payer: Self-pay | Admitting: Internal Medicine

## 2021-08-22 ENCOUNTER — Encounter (HOSPITAL_BASED_OUTPATIENT_CLINIC_OR_DEPARTMENT_OTHER): Payer: Self-pay | Admitting: Obstetrics and Gynecology

## 2021-08-22 ENCOUNTER — Other Ambulatory Visit: Payer: Self-pay

## 2021-08-22 ENCOUNTER — Emergency Department (HOSPITAL_BASED_OUTPATIENT_CLINIC_OR_DEPARTMENT_OTHER)
Admission: EM | Admit: 2021-08-22 | Discharge: 2021-08-23 | Disposition: A | Discharge status: Home / Self Care | Payer: Medicare HMO | Attending: Emergency Medicine | Admitting: Emergency Medicine

## 2021-08-22 DIAGNOSIS — Z96651 Presence of right artificial knee joint: Secondary | ICD-10-CM | POA: Diagnosis not present

## 2021-08-22 DIAGNOSIS — J45909 Unspecified asthma, uncomplicated: Secondary | ICD-10-CM | POA: Diagnosis not present

## 2021-08-22 DIAGNOSIS — Z853 Personal history of malignant neoplasm of breast: Secondary | ICD-10-CM | POA: Insufficient documentation

## 2021-08-22 DIAGNOSIS — Z79899 Other long term (current) drug therapy: Secondary | ICD-10-CM | POA: Diagnosis not present

## 2021-08-22 DIAGNOSIS — L03116 Cellulitis of left lower limb: Secondary | ICD-10-CM | POA: Insufficient documentation

## 2021-08-22 DIAGNOSIS — I10 Essential (primary) hypertension: Secondary | ICD-10-CM | POA: Diagnosis not present

## 2021-08-22 DIAGNOSIS — Z7982 Long term (current) use of aspirin: Secondary | ICD-10-CM | POA: Diagnosis not present

## 2021-08-22 DIAGNOSIS — M7989 Other specified soft tissue disorders: Secondary | ICD-10-CM | POA: Diagnosis not present

## 2021-08-22 LAB — COMPREHENSIVE METABOLIC PANEL
ALT: 22 U/L (ref 0–44)
AST: 18 U/L (ref 15–41)
Albumin: 4.5 g/dL (ref 3.5–5.0)
Alkaline Phosphatase: 80 U/L (ref 38–126)
Anion gap: 11 (ref 5–15)
BUN: 18 mg/dL (ref 8–23)
CO2: 25 mmol/L (ref 22–32)
Calcium: 9.6 mg/dL (ref 8.9–10.3)
Chloride: 104 mmol/L (ref 98–111)
Creatinine, Ser: 0.78 mg/dL (ref 0.44–1.00)
GFR, Estimated: >60 mL/min (ref >60)
Glucose, Bld: 106 mg/dL — ABNORMAL HIGH (ref 70–99)
Potassium: 3.8 mmol/L (ref 3.5–5.1)
Sodium: 140 mmol/L (ref 135–145)
Total Bilirubin: 0.4 mg/dL (ref 0.3–1.2)
Total Protein: 7.9 g/dL (ref 6.5–8.1)

## 2021-08-22 LAB — CBC WITH DIFFERENTIAL/PLATELET
Abs Immature Granulocytes: 0.01 10*3/uL (ref 0.00–0.07)
Basophils Absolute: 0 10*3/uL (ref 0.0–0.1)
Basophils Relative: 1 %
Eosinophils Absolute: 0.2 10*3/uL (ref 0.0–0.5)
Eosinophils Relative: 4 %
HCT: 45 % (ref 36.0–46.0)
Hemoglobin: 14.6 g/dL (ref 12.0–15.0)
Immature Granulocytes: 0 %
Lymphocytes Relative: 34 %
Lymphs Abs: 1.8 10*3/uL (ref 0.7–4.0)
MCH: 28.4 pg (ref 26.0–34.0)
MCHC: 32.4 g/dL (ref 30.0–36.0)
MCV: 87.5 fL (ref 80.0–100.0)
Monocytes Absolute: 0.6 10*3/uL (ref 0.1–1.0)
Monocytes Relative: 11 %
Neutro Abs: 2.7 10*3/uL (ref 1.7–7.7)
Neutrophils Relative %: 50 %
Platelets: 238 10*3/uL (ref 150–400)
RBC: 5.14 MIL/uL — ABNORMAL HIGH (ref 3.87–5.11)
RDW: 13.8 % (ref 11.5–15.5)
WBC: 5.3 10*3/uL (ref 4.0–10.5)
nRBC: 0 % (ref 0.0–0.2)

## 2021-08-22 LAB — LACTIC ACID, PLASMA: Lactic Acid, Venous: 1.9 mmol/L (ref 0.5–1.9)

## 2021-08-22 NOTE — ED Triage Notes (Signed)
Patient reports she has cellulitis in her leg. Patient states she has a hx of this and has had it 5 times.

## 2021-08-23 ENCOUNTER — Emergency Department (HOSPITAL_BASED_OUTPATIENT_CLINIC_OR_DEPARTMENT_OTHER): Payer: Medicare HMO

## 2021-08-23 DIAGNOSIS — L03116 Cellulitis of left lower limb: Secondary | ICD-10-CM | POA: Diagnosis not present

## 2021-08-23 DIAGNOSIS — L0390 Cellulitis, unspecified: Secondary | ICD-10-CM | POA: Diagnosis not present

## 2021-08-23 MED ORDER — DOXYCYCLINE HYCLATE 100 MG PO CAPS: 100.0000 mg | ORAL_CAPSULE | Freq: Two times a day (BID) | ORAL | 0 refills | Status: DC

## 2021-08-23 MED ORDER — CEFTRIAXONE SODIUM 1 G IJ SOLR
1.0000 g | INTRAMUSCULAR | Status: DC
  Administered 2021-08-23: 1 g via INTRAVENOUS
  Filled 2021-08-23: qty 10

## 2021-08-23 NOTE — ED Provider Notes (Signed)
[facility] EMERGENCY DEPT Provider Note   CSN: [id number] Arrival date & time: 08/22/21  1900     History Chief Complaint  Patient presents with   Cellulitis    Tamara [name] is a 70 y.o. female.  Patient presents to the emergency department with redness and swelling of her left leg.  Patient reports that she think she has cellulitis.  This has occurred in the past.  Patient reports that the area is more swollen than usual and there are some blisters on the back of the leg.  She denies any injury.      Past Medical History:  Diagnosis Date   Allergy    seasonal allergies ([place])   Anxiety    on meds   Arthritis    knees    Asthma    rare;only when around alot of dust-Ventolin inhaler as needed   Breast cancer ([place])     left. States she did not have lymph nodes removed   Bursitis of right shoulder    Cataract    not had sx as of yet   Cellulitis and abscess of leg    Complication of anesthesia    was told 01/06/14 that airway was small   Depression    takes Effexor and Wellbutrin daily   GERD (gastroesophageal reflux disease)    on meds   Hearing loss    History of bronchitis 1966   Hyperlipidemia    on meds   Hypertension    takes Coreg and Losartan daily   Joint pain    Joint swelling    Neuromuscular disorder ([place])    RLS   Obese    Peripheral edema    takes Furosemide daily as needed and Potassium daily   Personal history of radiation therapy 2015   Radiation 03/08/14-04/26/14   50.4 gray to left breast. Lumpectomy cavity boosted to 64.4 gray   Restless legs syndrome    takes depakote   Sleep apnea    uses CPAP   Sleep apnea, obstructive    uses CPAP   Wears glasses     Patient Active Problem List   Diagnosis Date Noted   History of colonic polyps    Polyp of rectum    Difficult intubation 05/29/2020   Chronic vulvitis 04/20/2020   Status post abdominoplasty 03/13/2020   History of bariatric surgery 03/13/2020   Mixed  conductive and sensorineural hearing loss of both ears 10/24/2019   Sensorineural hearing loss (SNHL), bilateral 10/24/2019   Chronic pain disorder 09/10/2018   Compression fracture of thoracic vertebra ([place]) 07/13/2018   Panniculitis 06/18/2018   Asthma, intermittent, uncomplicated 08/65/7846   GERD (gastroesophageal reflux disease) 03/02/2018   Ductal carcinoma in situ (DCIS) of left breast 03/01/2018   Degeneration of lumbar intervertebral disc 12/25/2017   Spinal stenosis of lumbar region 12/25/2017   Intertriginous candidiasis 10/02/2017   Polyp of transverse colon    Special screening for malignant neoplasms, colon    Vitamin D deficiency 02/16/2017   RLS (restless legs syndrome) 09/09/2016   Insomnia 07/11/2016   Anxiety and depression 07/11/2016   Generalized osteoarthritis of multiple sites 07/11/2016   Class 2 obesity with body mass index (BMI) of 36.0 to 36.9 in adult 01/28/2016   Shoulder pain 12/28/2014   Osteopenia 12/28/2014   Hot flashes 08/02/2014   Constipation 08/02/2014   Malignant neoplasm of upper-outer quadrant of left breast in female, estrogen receptor positive ([place]) 03/24/2014   CANDIDIASIS, SKIN 10/15/2009   PURE  HYPERCHOLESTEROLEMIA 04/25/2009   Hyperlipidemia, acquired 02/16/2009   Sleep apnea 02/16/2009   Depression 06/18/2007   Essential hypertension 06/18/2007    Past Surgical History:  Procedure Laterality Date   BREAST LUMPECTOMY Left 2015   BREAST LUMPECTOMY WITH NEEDLE LOCALIZATION Left 01/24/2014   Procedure: BREAST LUMPECTOMY WITH NEEDLE LOCALIZATION;  Surgeon: Edward Jolly, MD;  Location: [facility];  Service: General;  Laterality: Left;   COLONOSCOPY N/A 03/20/2017   Procedure: COLONOSCOPY;  Surgeon: Mauri Pole, MD;  [facility];  Service: Endoscopy;  Laterality: N/A;   COLONOSCOPY  2018   COLONOSCOPY WITH PROPOFOL N/A 08/21/2020   Procedure: COLONOSCOPY WITH PROPOFOL;  Surgeon: Mauri Pole, MD;  [facility];  Service: Endoscopy;  Laterality: N/A;   DILATATION & CURETTAGE/HYSTEROSCOPY WITH TRUECLEAR N/A 01/06/2014   Procedure: DILATATION & CURETTAGE/HYSTEROSCOPY WITH TRUCLEAR;  Surgeon: Shon Millet II, MD;  Location: [facility];  Service: Gynecology;  Laterality: N/A;   HYSTEROPLASTY  01/2014   INNER EAR SURGERY Bilateral    for hearing loss   JOINT REPLACEMENT Right    Knee   LAPAROSCOPIC GASTRIC SLEEVE RESECTION N/A 01/28/2016   Procedure: LAPAROSCOPIC GASTRIC SLEEVE RESECTION WITH UPPER ENDO;  Surgeon: Excell Seltzer, MD;  [facility];  Service: General;  Laterality: N/A;   LIPOSUCTION WITH LIPOFILLING Bilateral 06/20/2019   Procedure: BILATERAL THIGH LIPECTOMY;  Surgeon: Irene Limbo, MD;  Location: [facility];  Service: Plastics;  Laterality: Bilateral;   PANNICULECTOMY N/A 06/18/2018   Procedure: PANNICULECTOMY;  Surgeon: Irene Limbo, MD;  Location: [facility];  Service: Plastics;  Laterality: N/A;   POLYPECTOMY  08/21/2020   Procedure: POLYPECTOMY;  Surgeon: Mauri Pole, MD;  [facility];  Service: Endoscopy;;   RE-EXCISION OF BREAST LUMPECTOMY Left 02/02/2014   Procedure: RE-EXCISION OF LEFT BREAST LUMPECTOMY;  Surgeon: Edward Jolly, MD;  Location: [facility];  Service: General;  Laterality: Left;   TUBAL LIGATION     WISDOM TOOTH EXTRACTION  1970     OB History   No obstetric history on file.     Family History  Problem Relation Age of Onset   Dementia Mother 44   Heart attack Mother 44   Esophageal cancer Father 37   Heart attack Father 67   Stomach cancer Father 27   Colon polyps Brother    Asthma Other    Hypertension Other    Thyroid disease Other    Heart attack Other    Throat cancer Paternal Grandfather    Colon cancer Neg Hx    Rectal cancer Neg Hx     Social History   Tobacco Use   Smoking status: Never   Smokeless tobacco: Never  Vaping Use   Vaping Use: Never used  Substance Use Topics   Alcohol  use: No   Drug use: No    Home Medications Prior to Admission medications   Medication Sig Start Date End Date Taking? Authorizing Provider  doxycycline (VIBRAMYCIN) 100 MG capsule Take 1 capsule (100 mg total) by mouth 2 (two) times daily. 08/23/21  Yes Orpah Greek, MD  albuterol (VENTOLIN HFA) 108 (90 Base) MCG/ACT inhaler TAKE 2 PUFFS BY MOUTH EVERY 6 HOURS AS NEEDED FOR WHEEZE OR SHORTNESS OF BREATH 08/13/21   Isaac Bliss, Rayford Halsted, MD  amLODipine (NORVASC) 5 MG tablet Take 1 tablet (5 mg total) by mouth daily. 07/23/21   Erline Hau, MD  ASPIRIN LOW DOSE 81 MG EC tablet TAKE  1 TABLET BY MOUTH EVERYDAY AT BEDTIME 06/28/21   Isaac Bliss, Rayford Halsted, MD  atorvastatin (LIPITOR) 20 MG tablet TAKE 1 TABLET BY MOUTH EVERY DAY 07/24/21   Isaac Bliss, Rayford Halsted, MD  buPROPion (WELLBUTRIN XL) 300 MG 24 hr tablet TAKE 1 TABLET BY MOUTH EVERY DAY 07/22/21   Isaac Bliss, Rayford Halsted, MD  carvedilol (COREG) 12.5 MG tablet TAKE 1 TABLET BY MOUTH TWICE A DAY WITH MEALS 04/19/21   Isaac Bliss, Rayford Halsted, MD  Cholecalciferol (VITAMIN D3) 125 MCG (5000 UT) TABS TAKE 1 TABLET BY MOUTH EVERY DAY 03/26/21   Isaac Bliss, Rayford Halsted, MD  clindamycin (CLINDAGEL) 1 % gel Apply topically 2 (two) times daily. Patient taking differently: Apply 1 application topically 2 (two) times daily as needed (rash). 01/08/18   Magrinat, Virgie Dad, MD  furosemide (LASIX) 40 MG tablet TAKE 1/2 TABLET BY MOUTH DAILY AS NEEDED FOR EDEMA 02/19/21   Isaac Bliss, Rayford Halsted, MD  HYDROcodone-acetaminophen (NORCO/VICODIN) 5-325 MG tablet Take 1 tablet by mouth every 6 (six) hours as needed for moderate pain. 07/23/21   Isaac Bliss, Rayford Halsted, MD  HYDROcodone-acetaminophen (NORCO/VICODIN) 5-325 MG tablet Take 1 tablet by mouth every 6 (six) hours as needed for moderate pain. 07/23/21   Isaac Bliss, Rayford Halsted, MD  HYDROcodone-acetaminophen (NORCO/VICODIN) 5-325 MG tablet Take 1 tablet by mouth every  6 (six) hours as needed for moderate pain. 07/23/21   Isaac Bliss, Rayford Halsted, MD  LINZESS 290 MCG CAPS capsule TAKE 1 CAPSULE BY MOUTH EVERY DAY 11/16/20   Isaac Bliss, Rayford Halsted, MD  losartan (COZAAR) 100 MG tablet TAKE 1 TABLET BY MOUTH EVERY DAY 08/08/21   Isaac Bliss, Rayford Halsted, MD  meclizine (ANTIVERT) 25 MG tablet TAKE 1 TABLET BY MOUTH DAILY AS NEEDED FOR DIZZINESS 06/28/21   Isaac Bliss, Rayford Halsted, MD  Multiple Vitamins-Minerals (SENTRY SENIOR) TABS TAKE 1 TABLET BY MOUTH EVERY DAY 07/24/21   Isaac Bliss, Rayford Halsted, MD  pantoprazole (PROTONIX) 40 MG tablet TAKE 1 TABLET BY MOUTH EVERY DAY 04/19/21   Isaac Bliss, Rayford Halsted, MD  traZODone (DESYREL) 50 MG tablet Take 1 tablet (50 mg total) by mouth at bedtime as needed for sleep. 04/18/21   Parrett, Fonnie Mu, NP  venlafaxine XR (EFFEXOR-XR) 150 MG 24 hr capsule TAKE 1 CAPSULE BY MOUTH EVERY DAY WITH BREAKFAST 06/28/21   Magrinat, Virgie Dad, MD    Allergies    Patient has no known allergies.  Review of Systems   Review of Systems  Skin:  Positive for color change and wound.  All other systems reviewed and are negative.  Physical Exam Updated Vital Signs BP (!) 157/76   Pulse (!) 54   Temp 98.1 F (36.7 C)   Resp 17   Ht 5\' 5"  (1.651 m)   Wt 113.4 kg   SpO2 98%   BMI 41.60 kg/m   Physical Exam  ED Results / Procedures / Treatments   Labs (all labs ordered are listed, but only abnormal results are displayed) Labs Reviewed  COMPREHENSIVE METABOLIC PANEL - Abnormal; Notable for the following components:      Result Value   Glucose, Bld 106 (*)    All other components within normal limits  CBC WITH DIFFERENTIAL/PLATELET - Abnormal; Notable for the following components:   RBC 5.14 (*)    All other components within normal limits  LACTIC ACID, PLASMA  LACTIC ACID, PLASMA    EKG None  Radiology US Venous Img Lower Unilateral Left  Result Date: 08/23/2021 CLINICAL DATA:  Left leg pain and swelling.   Cellulitis. EXAM: Left LOWER EXTREMITY VENOUS DOPPLER ULTRASOUND TECHNIQUE: Gray-scale sonography with compression, as well as color and duplex ultrasound, were performed to evaluate the deep venous system(s) from the level of the common femoral vein through the popliteal and proximal calf veins. COMPARISON:  None. FINDINGS: VENOUS Normal compressibility of the common femoral, superficial femoral, and popliteal veins, as well as the visualized calf veins. Visualized portions of profunda femoral vein and great saphenous vein unremarkable. No filling defects to suggest DVT on grayscale or color Doppler imaging. Doppler waveforms show normal direction of venous flow, normal respiratory plasticity and response to augmentation. Limited views of the contralateral common femoral vein are unremarkable. OTHER None. Limitations: none IMPRESSION: No evidence of deep venous thrombosis in the visualized lower extremity veins. Electronically Signed   By: Lucienne Capers M.D.   On: 08/23/2021 01:56    Procedures Procedures   Medications Ordered in ED Medications  cefTRIAXone (ROCEPHIN) 1 g in sodium chloride 0.9 % 100 mL IVPB (1 g Intravenous New Bag/Given 08/23/21 0350)    ED Course  I have reviewed the triage vital signs and the nursing notes.  Pertinent labs & imaging results that were available during my care of the patient were reviewed by me and considered in my medical decision making (see chart for details).    MDM Rules/Calculators/A&P                           Patient presents to the emergency department for evaluation of redness and swelling of the left leg.  Patient reports that she has had recurrent cellulitis in the leg in the past.  There are chronic skin changes consistent with stasis dermatitis.  The leg is, however, red and warm to the touch.  No DVT seen on duplex.  Lab work unremarkable.  Vital signs are normal.  Patient does not appear ill.  No concern for sepsis.  Will be appropriate for  outpatient treatment.  Final Clinical Impression(s) / ED Diagnoses Final diagnoses:  Cellulitis of left lower extremity    Rx / DC Orders ED Discharge Orders          Ordered    doxycycline (VIBRAMYCIN) 100 MG capsule  2 times daily        08/23/21 0359             Orpah Greek, MD 08/23/21 [phone]

## 2021-08-26 ENCOUNTER — Other Ambulatory Visit: Payer: Self-pay

## 2021-08-27 ENCOUNTER — Ambulatory Visit: Payer: Medicare HMO | Admitting: Internal Medicine

## 2021-09-02 ENCOUNTER — Other Ambulatory Visit: Payer: Self-pay

## 2021-09-03 ENCOUNTER — Ambulatory Visit (INDEPENDENT_AMBULATORY_CARE_PROVIDER_SITE_OTHER): Payer: Medicare HMO | Admitting: Internal Medicine

## 2021-09-03 ENCOUNTER — Encounter: Payer: Self-pay | Admitting: Internal Medicine

## 2021-09-03 VITALS — BP 130/80 | HR 74 | Temp 97.8°F

## 2021-09-03 DIAGNOSIS — R296 Repeated falls: Secondary | ICD-10-CM

## 2021-09-03 DIAGNOSIS — Z23 Encounter for immunization: Secondary | ICD-10-CM

## 2021-09-03 DIAGNOSIS — L03116 Cellulitis of left lower limb: Secondary | ICD-10-CM | POA: Diagnosis not present

## 2021-09-03 DIAGNOSIS — M48061 Spinal stenosis, lumbar region without neurogenic claudication: Secondary | ICD-10-CM

## 2021-09-03 DIAGNOSIS — M159 Polyosteoarthritis, unspecified: Secondary | ICD-10-CM | POA: Diagnosis not present

## 2021-09-03 DIAGNOSIS — L02416 Cutaneous abscess of left lower limb: Secondary | ICD-10-CM | POA: Diagnosis not present

## 2021-09-03 DIAGNOSIS — I872 Venous insufficiency (chronic) (peripheral): Secondary | ICD-10-CM | POA: Diagnosis not present

## 2021-09-03 MED ORDER — DOXYCYCLINE HYCLATE 100 MG PO TABS: 100.0000 mg | ORAL_TABLET | Freq: Two times a day (BID) | ORAL | 0 refills | Status: AC

## 2021-09-03 NOTE — Patient Instructions (Signed)
-  Nice seeing you today!!  -Flu vaccine today.  -Compression stockings.

## 2021-09-03 NOTE — Progress Notes (Signed)
Established Patient Office Visit     This visit occurred during the SARS-CoV-2 public health emergency.  Safety protocols were in place, including screening questions prior to the visit, additional usage of staff PPE, and extensive cleaning of exam room while observing appropriate contact time as indicated for disinfecting solutions.    CC/Reason for Visit: ED follow-up, cellulitis, weakness  HPI: Tamara [name] is a 70 y.o. female who is coming in today for the above mentioned reasons.  She was seen in the emergency department about 8 days ago for left lower extremity cellulitis.  She was prescribed 7 days of doxycycline.  She states that while improved, the redness is still present.  She has a known history of venous stasis dermatitis.  She has not been wearing her compression stockings.  While in emergency department she had a lower extremity venous Doppler that was negative for DVT.  She has also been having some lower extremity weakness that has culminated in falls recently.  She had an emergency department previously for this with a negative work-up.  These are not syncopal episodes, she feels like her legs give out.  She went to see her orthopedist and was told she needed a knee replacement but is trying to temporize with cortisone injections.  Past Medical/Surgical History: Past Medical History:  Diagnosis Date   Allergy    seasonal allergies (Fall)   Anxiety    on meds   Arthritis    knees    Asthma    rare;only when around alot of dust-Ventolin inhaler as needed   Breast cancer ([place])     left. States she did not have lymph nodes removed   Bursitis of right shoulder    Cataract    not had sx as of yet   Cellulitis and abscess of leg    Complication of anesthesia    was told 01/06/14 that airway was small   Depression    takes Effexor and Wellbutrin daily   GERD (gastroesophageal reflux disease)    on meds   Hearing loss    History of bronchitis 1966    Hyperlipidemia    on meds   Hypertension    takes Coreg and Losartan daily   Joint pain    Joint swelling    Neuromuscular disorder (HCC)    RLS   Obese    Peripheral edema    takes Furosemide daily as needed and Potassium daily   Personal history of radiation therapy 2015   Radiation 03/08/14-04/26/14   50.4 gray to left breast. Lumpectomy cavity boosted to 64.4 gray   Restless legs syndrome    takes depakote   Sleep apnea    uses CPAP   Sleep apnea, obstructive    uses CPAP   Wears glasses     Past Surgical History:  Procedure Laterality Date   BREAST LUMPECTOMY Left 2015   BREAST LUMPECTOMY WITH NEEDLE LOCALIZATION Left 01/24/2014   Procedure: BREAST LUMPECTOMY WITH NEEDLE LOCALIZATION;  Surgeon: Edward Jolly, MD;  Location: [place];  Service: General;  Laterality: Left;   COLONOSCOPY N/A 03/20/2017   Procedure: COLONOSCOPY;  Surgeon: Mauri Pole, MD;  [facility];  Service: Endoscopy;  Laterality: N/A;   COLONOSCOPY  2018   COLONOSCOPY WITH PROPOFOL N/A 08/21/2020   Procedure: COLONOSCOPY WITH PROPOFOL;  Surgeon: Mauri Pole, MD;  [facility];  Service: Endoscopy;  Laterality: N/A;   DILATATION & CURETTAGE/HYSTEROSCOPY WITH TRUECLEAR N/A 01/06/2014  Procedure: [place];  Surgeon: Shon Millet II, MD;  Location: [facility];  Service: Gynecology;  Laterality: N/A;   HYSTEROPLASTY  01/2014   INNER EAR SURGERY Bilateral    for hearing loss   JOINT REPLACEMENT Right    Knee   LAPAROSCOPIC GASTRIC SLEEVE RESECTION N/A 01/28/2016   Procedure: LAPAROSCOPIC GASTRIC SLEEVE RESECTION WITH UPPER ENDO;  Surgeon: Excell Seltzer, MD;  [facility];  Service: General;  Laterality: N/A;   LIPOSUCTION WITH LIPOFILLING Bilateral 06/20/2019   Procedure: BILATERAL THIGH LIPECTOMY;  Surgeon: Irene Limbo, MD;  Location: [place];  Service: Plastics;  Laterality: Bilateral;   PANNICULECTOMY N/A 06/18/2018    Procedure: PANNICULECTOMY;  Surgeon: Irene Limbo, MD;  Location: [place];  Service: Plastics;  Laterality: N/A;   POLYPECTOMY  08/21/2020   Procedure: POLYPECTOMY;  Surgeon: Mauri Pole, MD;  [facility];  Service: Endoscopy;;   RE-EXCISION OF BREAST LUMPECTOMY Left 02/02/2014   Procedure: RE-EXCISION OF LEFT BREAST LUMPECTOMY;  Surgeon: Edward Jolly, MD;  Location: [place];  Service: General;  Laterality: Left;   [place] EXTRACTION  1970    Social History:  reports that she has never smoked. She has never used smokeless tobacco. She reports that she does not drink alcohol and does not use drugs.  Allergies: No Known Allergies  Family History:  Family History  Problem Relation Age of Onset   Dementia Mother 52   Heart attack Mother 57   Esophageal cancer Father 21   Heart attack Father 26   Stomach cancer Father 62   Colon polyps Brother    Asthma Other    Hypertension Other    Thyroid disease Other    Heart attack Other    Throat cancer Paternal Grandfather    Colon cancer Neg Hx    Rectal cancer Neg Hx      Current Outpatient Medications:    albuterol (VENTOLIN HFA) 108 (90 Base) MCG/ACT inhaler, TAKE 2 PUFFS BY MOUTH EVERY 6 HOURS AS NEEDED FOR WHEEZE OR SHORTNESS OF BREATH, Disp: 36 each, Rfl: 1   amLODipine (NORVASC) 5 MG tablet, Take 1 tablet (5 mg total) by mouth daily., Disp: 90 tablet, Rfl: 1   ASPIRIN LOW DOSE 81 MG EC tablet, TAKE 1 TABLET BY MOUTH EVERYDAY AT BEDTIME, Disp: 30 tablet, Rfl: 8   atorvastatin (LIPITOR) 20 MG tablet, TAKE 1 TABLET BY MOUTH EVERY DAY, Disp: 90 tablet, Rfl: 1   buPROPion (WELLBUTRIN XL) 300 MG 24 hr tablet, TAKE 1 TABLET BY MOUTH EVERY DAY, Disp: 90 tablet, Rfl: 1   carvedilol (COREG) 12.5 MG tablet, TAKE 1 TABLET BY MOUTH TWICE A DAY WITH MEALS, Disp: 180 tablet, Rfl: 1   Cholecalciferol (VITAMIN D3) 125 MCG (5000 UT) TABS, TAKE 1 TABLET BY MOUTH EVERY DAY, Disp: 30  tablet, Rfl: 8   clindamycin (CLINDAGEL) 1 % gel, Apply topically 2 (two) times daily. (Patient taking differently: Apply 1 application topically 2 (two) times daily as needed (rash).), Disp: 30 g, Rfl: 0   doxycycline (VIBRA-TABS) 100 MG tablet, Take 1 tablet (100 mg total) by mouth 2 (two) times daily for 7 days., Disp: 14 tablet, Rfl: 0   furosemide (LASIX) 40 MG tablet, TAKE 1/2 TABLET BY MOUTH DAILY AS NEEDED FOR EDEMA, Disp: 15 tablet, Rfl: 8   HYDROcodone-acetaminophen (NORCO/VICODIN) 5-325 MG tablet, Take 1 tablet by mouth every 6 (six) hours as needed for moderate pain.,  Disp: 60 tablet, Rfl: 0   HYDROcodone-acetaminophen (NORCO/VICODIN) 5-325 MG tablet, Take 1 tablet by mouth every 6 (six) hours as needed for moderate pain., Disp: 60 tablet, Rfl: 0   HYDROcodone-acetaminophen (NORCO/VICODIN) 5-325 MG tablet, Take 1 tablet by mouth every 6 (six) hours as needed for moderate pain., Disp: 60 tablet, Rfl: 0   LINZESS 290 MCG CAPS capsule, TAKE 1 CAPSULE BY MOUTH EVERY DAY, Disp: 90 capsule, Rfl: 2   losartan (COZAAR) 100 MG tablet, TAKE 1 TABLET BY MOUTH EVERY DAY, Disp: 90 tablet, Rfl: 1   meclizine (ANTIVERT) 25 MG tablet, TAKE 1 TABLET BY MOUTH DAILY AS NEEDED FOR DIZZINESS, Disp: 30 tablet, Rfl: 2   Multiple Vitamins-Minerals (SENTRY SENIOR) TABS, TAKE 1 TABLET BY MOUTH EVERY DAY, Disp: 90 tablet, Rfl: 1   pantoprazole (PROTONIX) 40 MG tablet, TAKE 1 TABLET BY MOUTH EVERY DAY, Disp: 90 tablet, Rfl: 1   traZODone (DESYREL) 50 MG tablet, Take 1 tablet (50 mg total) by mouth at bedtime as needed for sleep., Disp: 90 tablet, Rfl: 1   venlafaxine XR (EFFEXOR-XR) 150 MG 24 hr capsule, TAKE 1 CAPSULE BY MOUTH EVERY DAY WITH BREAKFAST, Disp: 30 capsule, Rfl: 2  Review of Systems:  Constitutional: Denies fever, chills, diaphoresis, appetite change and fatigue.  HEENT: Denies photophobia, eye pain, redness, hearing loss, ear pain, congestion, sore throat, rhinorrhea, sneezing, mouth sores, trouble  swallowing, neck pain, neck stiffness and tinnitus.   Respiratory: Denies SOB, DOE, cough, chest tightness,  and wheezing.   Cardiovascular: Denies chest pain, palpitations and leg swelling.  Gastrointestinal: Denies nausea, vomiting, abdominal pain, diarrhea, constipation, blood in stool and abdominal distention.  Genitourinary: Denies dysuria, urgency, frequency, hematuria, flank pain and difficulty urinating.  Endocrine: Denies: hot or cold intolerance, sweats, changes in hair or nails, polyuria, polydipsia. Musculoskeletal: Positive for myalgias, back pain, joint swelling, arthralgias and gait problem.  Skin: Denies pallor, rash and wound.  Neurological: Denies dizziness, seizures, syncope, weakness, light-headedness, numbness and headaches.  Hematological: Denies adenopathy. Easy bruising, personal or family bleeding history  Psychiatric/Behavioral: Denies suicidal ideation, mood changes, confusion, nervousness, sleep disturbance and agitation    Physical Exam: Vitals:   09/03/21 1101  BP: 130/80  Pulse: 74  Temp: 97.8 F (36.6 C)  TempSrc: Oral  SpO2: 99%    There is no height or weight on file to calculate BMI.   Constitutional: NAD, calm, comfortable Eyes: PERRL, lids and conjunctivae normal, wears corrective lenses ENMT: Mucous membranes are moist.  Respiratory: clear to auscultation bilaterally, no wheezing, no crackles. Normal respiratory effort. No accessory muscle use.  Cardiovascular: Regular rate and rhythm, no murmurs / rubs / gallops.  2+ pitting edema bilaterally up to the knees, left lower leg with some redness and draining blisters.   Psychiatric: Normal judgment and insight. Alert and oriented x 3. Normal mood.    Impression and Plan:  Cellulitis and abscess of left leg  - Plan: doxycycline (VIBRA-TABS) 100 MG tablet -I will give her another 7 days of doxycycline, do not believe further work-up is necessary.   Frequent falls   Spinal stenosis of lumbar  region, unspecified whether neurogenic claudication present  Generalized osteoarthritis of multiple sites  - Plan: Ambulatory referral to [facility] flu shot  - Plan: Flu Vaccine QUAD High Dose(Fluad)  Time spent: 32 minutes reviewing chart, interviewing patient and son, examining patient and formulating plan of care.   Patient Instructions  -Nice seeing you today!!  -Flu vaccine today.  -Compression  stockings.   Lelon Frohlich, MD  Primary [facility] at [facility]

## 2021-09-06 DIAGNOSIS — Z008 Encounter for other general examination: Secondary | ICD-10-CM | POA: Diagnosis not present

## 2021-09-06 DIAGNOSIS — J45909 Unspecified asthma, uncomplicated: Secondary | ICD-10-CM | POA: Diagnosis not present

## 2021-09-06 DIAGNOSIS — G4700 Insomnia, unspecified: Secondary | ICD-10-CM | POA: Diagnosis not present

## 2021-09-06 DIAGNOSIS — F419 Anxiety disorder, unspecified: Secondary | ICD-10-CM | POA: Diagnosis not present

## 2021-09-06 DIAGNOSIS — E785 Hyperlipidemia, unspecified: Secondary | ICD-10-CM | POA: Diagnosis not present

## 2021-09-06 DIAGNOSIS — F324 Major depressive disorder, single episode, in partial remission: Secondary | ICD-10-CM | POA: Diagnosis not present

## 2021-09-06 DIAGNOSIS — K219 Gastro-esophageal reflux disease without esophagitis: Secondary | ICD-10-CM | POA: Diagnosis not present

## 2021-09-06 DIAGNOSIS — G4733 Obstructive sleep apnea (adult) (pediatric): Secondary | ICD-10-CM | POA: Diagnosis not present

## 2021-09-06 DIAGNOSIS — R69 Illness, unspecified: Secondary | ICD-10-CM | POA: Diagnosis not present

## 2021-09-06 DIAGNOSIS — I10 Essential (primary) hypertension: Secondary | ICD-10-CM | POA: Diagnosis not present

## 2021-09-06 DIAGNOSIS — K5900 Constipation, unspecified: Secondary | ICD-10-CM | POA: Diagnosis not present

## 2021-09-06 DIAGNOSIS — L089 Local infection of the skin and subcutaneous tissue, unspecified: Secondary | ICD-10-CM | POA: Diagnosis not present

## 2021-09-06 DIAGNOSIS — G8929 Other chronic pain: Secondary | ICD-10-CM | POA: Diagnosis not present

## 2021-09-09 DIAGNOSIS — G4733 Obstructive sleep apnea (adult) (pediatric): Secondary | ICD-10-CM | POA: Diagnosis not present

## 2021-09-19 ENCOUNTER — Telehealth: Payer: Self-pay | Admitting: Internal Medicine

## 2021-09-19 NOTE — Chronic Care Management (AMB) (Signed)
  Chronic Care Management   Outreach Note  09/19/2021 Name: Tamara [name] MRN: [id number] DOB: [birth year]  Referred by: Isaac Bliss, Rayford Halsted, MD Reason for referral : No chief complaint on file.   An unsuccessful telephone outreach was attempted today. The patient was referred to the pharmacist for assistance with care management and care coordination.   Follow Up Plan:   Tatjana Dellinger Upstream Scheduler

## 2021-09-20 ENCOUNTER — Telehealth: Payer: Self-pay | Admitting: Internal Medicine

## 2021-09-20 NOTE — Chronic Care Management (AMB) (Signed)
  Chronic Care Management   Note  09/20/2021 Name: Tamara [name] MRN: [id number] DOB: 23-May-1951  Tamara [name] is a 71 y.o. year old female who is a primary care patient of Isaac Bliss, Rayford Halsted, MD. I reached out to Tamara [name] by phone today in response to a referral sent by Tamara [name]'s PCP, Isaac Bliss, Rayford Halsted, MD.   Tamara [name] was given information about Chronic Care Management services today including:  CCM service includes personalized support from designated clinical staff supervised by her physician, including individualized plan of care and coordination with other care providers 24/7 contact phone numbers for assistance for urgent and routine care needs. Service will only be billed when office clinical staff spend 20 minutes or more in a month to coordinate care. Only one practitioner may furnish and bill the service in a calendar month. The patient may stop CCM services at any time (effective at the end of the month) by phone call to the office staff.   Patient agreed to services and verbal consent obtained.   Follow up plan:   Tatjana Secretary/administrator

## 2021-09-27 ENCOUNTER — Other Ambulatory Visit: Payer: Self-pay | Admitting: Oncology

## 2021-09-27 ENCOUNTER — Other Ambulatory Visit: Payer: Self-pay | Admitting: Internal Medicine

## 2021-10-04 DIAGNOSIS — G4733 Obstructive sleep apnea (adult) (pediatric): Secondary | ICD-10-CM | POA: Diagnosis not present

## 2021-10-08 ENCOUNTER — Other Ambulatory Visit: Payer: Self-pay | Admitting: Adult Health

## 2021-10-08 NOTE — Telephone Encounter (Signed)
Dr. Elsworth Soho, please advise if you are okay with refilling med.

## 2021-10-08 NOTE — Telephone Encounter (Signed)
Dr. Elsworth Soho we are not able to refill this medication for patient. Please sign the order that is pended below  ATC patient to get her scheduled for appt with Dr. Elsworth Soho, [facility]

## 2021-10-09 ENCOUNTER — Telehealth: Payer: Self-pay | Admitting: Adult Health

## 2021-10-09 MED ORDER — TRAZODONE HCL 50 MG PO TABS: 50.0000 mg | ORAL_TABLET | Freq: Every evening | ORAL | 1 refills | Status: AC | PRN

## 2021-10-09 NOTE — Telephone Encounter (Signed)
That is fine for refill

## 2021-10-09 NOTE — Telephone Encounter (Signed)
TP please advise on the refill of the trazodone for the pt.  This was last filled at her last OV on 5/22 for #90 with 1 refill.    Pt does have a pending appt with you on 12/16/20.  thanks

## 2021-10-09 NOTE — Telephone Encounter (Signed)
Refill has been sent to the pharmacy nothing further is needed.  

## 2021-10-09 NOTE — Telephone Encounter (Signed)
Refill sent.

## 2021-10-16 ENCOUNTER — Ambulatory Visit: Payer: Medicare HMO | Admitting: Adult Health

## 2021-10-23 DIAGNOSIS — M542 Cervicalgia: Secondary | ICD-10-CM | POA: Diagnosis not present

## 2021-10-29 ENCOUNTER — Encounter: Payer: Self-pay | Admitting: Internal Medicine

## 2021-10-29 ENCOUNTER — Other Ambulatory Visit: Payer: Self-pay | Admitting: Internal Medicine

## 2021-10-29 ENCOUNTER — Ambulatory Visit (INDEPENDENT_AMBULATORY_CARE_PROVIDER_SITE_OTHER): Payer: Medicare HMO | Admitting: Internal Medicine

## 2021-10-29 VITALS — BP 124/80 | HR 60 | Temp 98.5°F | Wt 238.7 lb

## 2021-10-29 DIAGNOSIS — R296 Repeated falls: Secondary | ICD-10-CM | POA: Diagnosis not present

## 2021-10-29 DIAGNOSIS — I10 Essential (primary) hypertension: Secondary | ICD-10-CM

## 2021-10-29 DIAGNOSIS — G894 Chronic pain syndrome: Secondary | ICD-10-CM

## 2021-10-29 DIAGNOSIS — M159 Polyosteoarthritis, unspecified: Secondary | ICD-10-CM | POA: Diagnosis not present

## 2021-10-29 DIAGNOSIS — R2689 Other abnormalities of gait and mobility: Secondary | ICD-10-CM

## 2021-10-29 DIAGNOSIS — K219 Gastro-esophageal reflux disease without esophagitis: Secondary | ICD-10-CM

## 2021-10-29 MED ORDER — HYDROCODONE-ACETAMINOPHEN 5-325 MG PO TABS: 1.0000 | ORAL_TABLET | Freq: Four times a day (QID) | ORAL | 0 refills | Status: DC | PRN

## 2021-10-29 NOTE — Patient Instructions (Signed)
-  Nice seeing you today!!  -Remember your COVID booster.  -Schedule follow up in 3 months for your physical. Please come in fasting that day for labs.

## 2021-10-29 NOTE — Progress Notes (Signed)
Established Patient Office Visit     This visit occurred during the SARS-CoV-2 public health emergency.  Safety protocols were in place, including screening questions prior to the visit, additional usage of staff PPE, and extensive cleaning of exam room while observing appropriate contact time as indicated for disinfecting solutions.    CC/Reason for Visit: Medication refills  HPI: Tamara [name] is a 70 y.o. female who is coming in today for the above mentioned reasons. Past Medical History is significant for: Morbid obesity, hypertension, GERD, hyperlipidemia, prior breast cancer, obstructive sleep apnea and vitamin D deficiency.  She is here today for hydrocodone refills that she takes for chronic back pain.  She is on 5/325 mg and takes 1 tablet twice daily.  She tells me that she has recently visited orthopedics, Dr. Mardelle Matte, due to right cervical radiculopathy.  It appears she got IM steroids and is due to follow-up again within the next week.  She has not noticed any significant improvement.   Past Medical/Surgical History: Past Medical History:  Diagnosis Date   Allergy    seasonal allergies (Fall)   Anxiety    on meds   Arthritis    knees    Asthma    rare;only when around alot of dust-Ventolin inhaler as needed   Breast cancer ([place])     left. States she did not have lymph nodes removed   Bursitis of right shoulder    Cataract    not had sx as of yet   Cellulitis and abscess of leg    Complication of anesthesia    was told 01/06/14 that airway was small   Depression    takes Effexor and Wellbutrin daily   GERD (gastroesophageal reflux disease)    on meds   Hearing loss    History of bronchitis 1966   Hyperlipidemia    on meds   Hypertension    takes Coreg and Losartan daily   Joint pain    Joint swelling    Neuromuscular disorder (HCC)    RLS   Obese    Peripheral edema    takes Furosemide daily as needed and Potassium daily   Personal history of  radiation therapy 2015   Radiation 03/08/14-04/26/14   50.4 gray to left breast. Lumpectomy cavity boosted to 64.4 gray   Restless legs syndrome    takes depakote   Sleep apnea    uses CPAP   Sleep apnea, obstructive    uses CPAP   Wears glasses     Past Surgical History:  Procedure Laterality Date   BREAST LUMPECTOMY Left 2015   BREAST LUMPECTOMY WITH NEEDLE LOCALIZATION Left 01/24/2014   Procedure: BREAST LUMPECTOMY WITH NEEDLE LOCALIZATION;  Surgeon: Edward Jolly, MD;  Location: [place];  Service: General;  Laterality: Left;   COLONOSCOPY N/A 03/20/2017   Procedure: COLONOSCOPY;  Surgeon: Mauri Pole, MD;  [facility];  Service: Endoscopy;  Laterality: N/A;   COLONOSCOPY  2018   COLONOSCOPY WITH PROPOFOL N/A 08/21/2020   Procedure: COLONOSCOPY WITH PROPOFOL;  Surgeon: Mauri Pole, MD;  [facility];  Service: Endoscopy;  Laterality: N/A;   DILATATION & CURETTAGE/HYSTEROSCOPY WITH TRUECLEAR N/A 01/06/2014   Procedure: DILATATION & CURETTAGE/HYSTEROSCOPY WITH TRUCLEAR;  Surgeon: Shon Millet II, MD;  Location: [facility];  Service: Gynecology;  Laterality: N/A;   HYSTEROPLASTY  01/2014   INNER EAR SURGERY Bilateral    for hearing loss   JOINT REPLACEMENT Right  Knee   LAPAROSCOPIC GASTRIC SLEEVE RESECTION N/A 01/28/2016   Procedure: LAPAROSCOPIC GASTRIC SLEEVE RESECTION WITH UPPER ENDO;  Surgeon: Excell Seltzer, MD;  [facility];  Service: General;  Laterality: N/A;   LIPOSUCTION WITH LIPOFILLING Bilateral 06/20/2019   Procedure: BILATERAL THIGH LIPECTOMY;  Surgeon: Irene Limbo, MD;  Location: [place];  Service: Plastics;  Laterality: Bilateral;   PANNICULECTOMY N/A 06/18/2018   Procedure: PANNICULECTOMY;  Surgeon: Irene Limbo, MD;  Location: [place];  Service: Plastics;  Laterality: N/A;   POLYPECTOMY  08/21/2020   Procedure: POLYPECTOMY;  Surgeon: Mauri Pole, MD;  [facility];  Service:  Endoscopy;;   RE-EXCISION OF BREAST LUMPECTOMY Left 02/02/2014   Procedure: RE-EXCISION OF LEFT BREAST LUMPECTOMY;  Surgeon: Edward Jolly, MD;  Location: [place];  Service: General;  Laterality: Left;   [place] EXTRACTION  1970    Social History:  reports that she has never smoked. She has never used smokeless tobacco. She reports that she does not drink alcohol and does not use drugs.  Allergies: No Known Allergies  Family History:  Family History  Problem Relation Age of Onset   Dementia Mother 9   Heart attack Mother 31   Esophageal cancer Father 32   Heart attack Father 72   Stomach cancer Father 49   Colon polyps Brother    Asthma Other    Hypertension Other    Thyroid disease Other    Heart attack Other    Throat cancer Paternal Grandfather    Colon cancer Neg Hx    Rectal cancer Neg Hx      Current Outpatient Medications:    albuterol (VENTOLIN HFA) 108 (90 Base) MCG/ACT inhaler, TAKE 2 PUFFS BY MOUTH EVERY 6 HOURS AS NEEDED FOR WHEEZE OR SHORTNESS OF BREATH, Disp: 36 each, Rfl: 1   amLODipine (NORVASC) 5 MG tablet, Take 1 tablet (5 mg total) by mouth daily., Disp: 90 tablet, Rfl: 1   ASPIRIN LOW DOSE 81 MG EC tablet, TAKE 1 TABLET BY MOUTH EVERYDAY AT BEDTIME, Disp: 30 tablet, Rfl: 8   atorvastatin (LIPITOR) 20 MG tablet, TAKE 1 TABLET BY MOUTH EVERY DAY, Disp: 90 tablet, Rfl: 1   buPROPion (WELLBUTRIN XL) 300 MG 24 hr tablet, TAKE 1 TABLET BY MOUTH EVERY DAY, Disp: 90 tablet, Rfl: 1   carvedilol (COREG) 12.5 MG tablet, TAKE 1 TABLET BY MOUTH TWICE A DAY WITH MEALS, Disp: 180 tablet, Rfl: 1   Cholecalciferol (VITAMIN D3) 125 MCG (5000 UT) TABS, TAKE 1 TABLET BY MOUTH EVERY DAY, Disp: 30 tablet, Rfl: 8   clindamycin (CLINDAGEL) 1 % gel, Apply topically 2 (two) times daily. (Patient taking differently: Apply 1 application topically 2 (two) times daily as needed (rash).), Disp: 30 g, Rfl: 0   furosemide (LASIX) 40 MG tablet, TAKE 1/2 TABLET BY  MOUTH DAILY AS NEEDED FOR EDEMA, Disp: 15 tablet, Rfl: 8   LINZESS 290 MCG CAPS capsule, TAKE 1 CAPSULE BY MOUTH EVERY DAY, Disp: 90 capsule, Rfl: 2   losartan (COZAAR) 100 MG tablet, TAKE 1 TABLET BY MOUTH EVERY DAY, Disp: 90 tablet, Rfl: 1   meclizine (ANTIVERT) 25 MG tablet, TAKE 1 TABLET BY MOUTH DAILY AS NEEDED FOR DIZZINESS, Disp: 30 tablet, Rfl: 2   Multiple Vitamins-Minerals (SENTRY SENIOR) TABS, TAKE 1 TABLET BY MOUTH EVERY DAY, Disp: 90 tablet, Rfl: 1   pantoprazole (PROTONIX) 40 MG tablet, TAKE 1 TABLET BY MOUTH EVERY DAY, Disp: 90 tablet,  Rfl: 1   traZODone (DESYREL) 50 MG tablet, TAKE 1 TABLET BY MOUTH AT BEDTIME AS NEEDED FOR SLEEP., Disp: 90 tablet, Rfl: 1   traZODone (DESYREL) 50 MG tablet, Take 1 tablet (50 mg total) by mouth at bedtime as needed for sleep., Disp: 90 tablet, Rfl: 1   venlafaxine XR (EFFEXOR-XR) 150 MG 24 hr capsule, TAKE 1 CAPSULE BY MOUTH EVERY DAY WITH BREAKFAST, Disp: 30 capsule, Rfl: 2   HYDROcodone-acetaminophen (NORCO/VICODIN) 5-325 MG tablet, Take 1 tablet by mouth every 6 (six) hours as needed for moderate pain., Disp: 60 tablet, Rfl: 0   HYDROcodone-acetaminophen (NORCO/VICODIN) 5-325 MG tablet, Take 1 tablet by mouth every 6 (six) hours as needed for moderate pain., Disp: 60 tablet, Rfl: 0   HYDROcodone-acetaminophen (NORCO/VICODIN) 5-325 MG tablet, Take 1 tablet by mouth every 6 (six) hours as needed for moderate pain., Disp: 60 tablet, Rfl: 0  Review of Systems:  Constitutional: Denies fever, chills, diaphoresis, appetite change and fatigue.  HEENT: Denies photophobia, eye pain, redness, hearing loss, ear pain, congestion, sore throat, rhinorrhea, sneezing, mouth sores, trouble swallowing, neck pain, neck stiffness and tinnitus.   Respiratory: Denies SOB, DOE, cough, chest tightness,  and wheezing.   Cardiovascular: Denies chest pain, palpitations and leg swelling.  Gastrointestinal: Denies nausea, vomiting, abdominal pain, diarrhea, constipation, blood  in stool and abdominal distention.  Genitourinary: Denies dysuria, urgency, frequency, hematuria, flank pain and difficulty urinating.  Endocrine: Denies: hot or cold intolerance, sweats, changes in hair or nails, polyuria, polydipsia. Musculoskeletal: Positive for myalgias, back pain, joint swelling, arthralgias and gait problem.  Skin: Denies pallor, rash and wound.  Neurological: Denies dizziness, seizures, syncope, weakness, light-headedness, numbness and headaches.  Hematological: Denies adenopathy. Easy bruising, personal or family bleeding history  Psychiatric/Behavioral: Denies suicidal ideation, mood changes, confusion, nervousness, sleep disturbance and agitation    Physical Exam: Vitals:   10/29/21 0842  BP: 124/80  Pulse: 60  Temp: 98.5 F (36.9 C)  TempSrc: Oral  SpO2: 99%  Weight: 238 lb 11.2 oz (108.3 kg)    Body mass index is 39.72 kg/m.   Constitutional: NAD, calm, comfortable, obese, ambulates with a walker Eyes: PERRL, lids and conjunctivae normal ENMT: Mucous membranes are moist.  Respiratory: clear to auscultation bilaterally, no wheezing, no crackles. Normal respiratory effort. No accessory muscle use.  Cardiovascular: Regular rate and rhythm, no murmurs / rubs / gallops. No extremity edema.  Psychiatric: Normal judgment and insight. Alert and oriented x 3. Normal mood.    Impression and Plan:  Chronic pain disorder Generalized osteoarthritis of multiple sites   -PDMP reviewed, overdose risk score is 50, no red flags. -Refill hydrocodone 5/325 mg to take 1 tablet twice daily as needed for pain for total of 60 tablets a month x3 months.  She has been counseled to get her COVID booster and return in 3 months fasting for physical.  Time spent: 22 minutes reviewing chart, interviewing and examining patient and formulating plan of care.   Patient Instructions  -Nice seeing you today!!  -Remember your COVID booster.  -Schedule follow up in 3 months  for your physical. Please come in fasting that day for labs.    Lelon Frohlich, MD [facility]

## 2021-11-03 DIAGNOSIS — G4733 Obstructive sleep apnea (adult) (pediatric): Secondary | ICD-10-CM | POA: Diagnosis not present

## 2021-11-13 ENCOUNTER — Telehealth: Payer: Self-pay | Admitting: Pharmacist

## 2021-11-13 NOTE — Progress Notes (Signed)
Chronic Care Management Pharmacy Assistant   Name: Tamara [name]  MRN: [id number] DOB: 06/04/1951  Reason for Encounter: Chart Prep for initial encounter with [place] Pharmacist on 12/21 at 10:30 in office   Conditions to be addressed/monitored: HTN, HLD, Anxiety, Depression, GERD, and Asthma  Recent office visits:  10/29/21 Isaac Bliss, Rayford Halsted, MD - Patient presented for frequent falls and other concerns. No medication changes.  09/03/21 Isaac Bliss, Rayford Halsted, MD - Patient presented for Cellulitis and abscess of left leg and other concerns. Prescribed Doxycycline 100 mg  07/23/21 Isaac Bliss, Rayford Halsted, MD - Patient presented for chronic pain disorder and other concerns. Changed Amlodipine to 5 mg daily  Recent consult visits:  07/31/21 [facility]) - Patient presented for eye exam no other visit details available.  07/29/21 Marchia Bond (Orthopedic Surg) - Patient presented for osteoarthritis of left knee and other concerns. No other visit details available.  05/27/21 [facility] - Patient presented for age related nuclear cataract of left eye. No other visit details available.   Hospital visits:  Medication Reconciliation was completed by comparing discharge summary, patients EMR and Pharmacy list, and upon discussion with patient.  Patient presented to [facility] ED on 08/22/21 due to Cellulitis. Patient was present for 4 hours.  New?Medications Started at [facility] Discharge:?? -started doxycycline 100 MG capsule  Medication Changes at Hospital Discharge: -Changed  None  Medications Discontinued at Hospital Discharge: -Stopped  None  Medications that remain the same after Hospital Discharge:??  -All other medications will remain the same.    Medication Reconciliation was completed by comparing discharge summary, patients EMR and Pharmacy list, and upon discussion with  patient.    Patient presented to [facility] on 9/6//22 due to [facility] Patient was present for 1 hour.  New?Medications Started at [facility] Discharge:?? -started  None  Medication Changes at Hospital Discharge: -Changed  None  Medications Discontinued at Hospital Discharge: -Stopped  None  Medications that remain the same after Hospital Discharge:??  -All other medications will remain the same.    Medications: Outpatient Encounter Medications as of 11/13/2021  Medication Sig   albuterol (VENTOLIN HFA) 108 (90 Base) MCG/ACT inhaler TAKE 2 PUFFS BY MOUTH EVERY 6 HOURS AS NEEDED FOR WHEEZE OR SHORTNESS OF BREATH   amLODipine (NORVASC) 5 MG tablet Take 1 tablet (5 mg total) by mouth daily.   ASPIRIN LOW DOSE 81 MG EC tablet TAKE 1 TABLET BY MOUTH EVERYDAY AT BEDTIME   atorvastatin (LIPITOR) 20 MG tablet TAKE 1 TABLET BY MOUTH EVERY DAY   buPROPion (WELLBUTRIN XL) 300 MG 24 hr tablet TAKE 1 TABLET BY MOUTH EVERY DAY   carvedilol (COREG) 12.5 MG tablet TAKE 1 TABLET BY MOUTH TWICE A DAY WITH MEALS   Cholecalciferol (VITAMIN D3) 125 MCG (5000 UT) TABS TAKE 1 TABLET BY MOUTH EVERY DAY   clindamycin (CLINDAGEL) 1 % gel Apply topically 2 (two) times daily. (Patient taking differently: Apply 1 application topically 2 (two) times daily as needed (rash).)   furosemide (LASIX) 40 MG tablet TAKE 1/2 TABLET BY MOUTH DAILY AS NEEDED FOR EDEMA   HYDROcodone-acetaminophen (NORCO/VICODIN) 5-325 MG tablet Take 1 tablet by mouth every 6 (six) hours as needed for moderate pain.   HYDROcodone-acetaminophen (NORCO/VICODIN) 5-325 MG tablet Take 1 tablet by mouth every 6 (six) hours as needed for moderate pain.   HYDROcodone-acetaminophen (NORCO/VICODIN) 5-325 MG tablet Take 1 tablet by mouth every 6 (six)  hours as needed for moderate pain.   LINZESS 290 MCG CAPS capsule TAKE 1 CAPSULE BY MOUTH EVERY DAY   losartan (COZAAR) 100 MG tablet TAKE 1 TABLET BY MOUTH EVERY DAY   meclizine  (ANTIVERT) 25 MG tablet TAKE 1 TABLET BY MOUTH DAILY AS NEEDED FOR DIZZINESS   Multiple Vitamins-Minerals (SENTRY SENIOR) TABS TAKE 1 TABLET BY MOUTH EVERY DAY   pantoprazole (PROTONIX) 40 MG tablet TAKE 1 TABLET BY MOUTH EVERY DAY   traZODone (DESYREL) 50 MG tablet TAKE 1 TABLET BY MOUTH AT BEDTIME AS NEEDED FOR SLEEP.   traZODone (DESYREL) 50 MG tablet Take 1 tablet (50 mg total) by mouth at bedtime as needed for sleep.   venlafaxine XR (EFFEXOR-XR) 150 MG 24 hr capsule TAKE 1 CAPSULE BY MOUTH EVERY DAY WITH BREAKFAST   No facility-administered encounter medications on file as of 11/13/2021.  Fill History Gaps : albuterol (PROVENTIL HFA;VENTOLIN HFA) inhaler 09/29/2021 50   AMLODIPINE BESYLATE 5 MG TAB 10/19/2021 90   ASPIRIN EC 81 MG TABLET 09/10/2021 30   ATORVASTATIN 20MG  TAB 10/11/2021 30   BUPROPION HCL XL 300 MG TABLET 10/19/2021 90   CARVEDILOL 12.5MG  TAB 10/11/2021 30   VITAMIN D3 5,000 UNIT TABLET 09/10/2021 30   FUROSEMIDE 40MG  TAB 10/11/2021 30   HYDROCODONE-ACETAMIN 5-325 MG 10/07/2021 15   LINZESS 290 MCG CAPSULE 11/16/2020 90   LOSARTAN POTASSIUM 100 MG TAB 08/08/2021 90   MECLIZINE 25 MG TABLET 49/20/1007 30   CERTAVITE SENIOR TABLET 09/10/2021 30   PANTOPRAZOLE 40MG  DR TAB 10/11/2021 30   TRAZODONE 50 MG TABLET 09/21/2021 90   VENLAFAXINE 150MG  ER CAP 10/11/2021 30     Care Gaps: Hepatitis Screening - Overdue BP- 124/80 (10/29/21) AWV- Lab Results  Component Value Date   HGBA1C 5.8 04/13/2020    Star Rating Drugs: Atorvastatin 20 mg - Last filled 10/11/21 30 DS at [facility]  Losartan 100 mg - Last filled 08/08/21 90 DS at [facility]  Verified with Pharm as accurate    Reminder complete on 11/18/21 left vm Unable to reach for initial questions  [place] Pharmacist Assistant [phone]

## 2021-11-14 DIAGNOSIS — R296 Repeated falls: Secondary | ICD-10-CM | POA: Diagnosis not present

## 2021-11-14 DIAGNOSIS — I10 Essential (primary) hypertension: Secondary | ICD-10-CM | POA: Diagnosis not present

## 2021-11-14 DIAGNOSIS — M159 Polyosteoarthritis, unspecified: Secondary | ICD-10-CM | POA: Diagnosis not present

## 2021-11-14 DIAGNOSIS — R69 Illness, unspecified: Secondary | ICD-10-CM | POA: Diagnosis not present

## 2021-11-14 DIAGNOSIS — G2581 Restless legs syndrome: Secondary | ICD-10-CM | POA: Diagnosis not present

## 2021-11-14 DIAGNOSIS — G894 Chronic pain syndrome: Secondary | ICD-10-CM | POA: Diagnosis not present

## 2021-11-14 DIAGNOSIS — L03116 Cellulitis of left lower limb: Secondary | ICD-10-CM | POA: Diagnosis not present

## 2021-11-14 DIAGNOSIS — M5412 Radiculopathy, cervical region: Secondary | ICD-10-CM | POA: Diagnosis not present

## 2021-11-19 NOTE — Progress Notes (Deleted)
Chronic Care Management Pharmacy Note  11/19/2021 Name:  Tamara [name] MRN:  [id number] DOB:  23-Apr-1951  Summary: ***  Recommendations/Changes made from today's visit: ***  Plan: ***   Subjective: Tamara [name] is an 70 y.o. year old female who is a primary patient of Isaac Bliss, Rayford Halsted, MD.  The CCM team was consulted for assistance with disease management and care coordination needs.    Engaged with patient face to face for initial visit in response to provider referral for pharmacy case management and/or care coordination services.   Consent to Services:  The patient was given the following information about Chronic Care Management services today, agreed to services, and gave verbal consent: 1. CCM service includes personalized support from designated clinical staff supervised by the primary care provider, including individualized plan of care and coordination with other care providers 2. 24/7 contact phone numbers for assistance for urgent and routine care needs. 3. Service will only be billed when office clinical staff spend 20 minutes or more in a month to coordinate care. 4. Only one practitioner may furnish and bill the service in a calendar month. 5.The patient may stop CCM services at any time (effective at the end of the month) by phone call to the office staff. 6. The patient will be responsible for cost sharing (co-pay) of up to 20% of the service fee (after annual deductible is met). Patient agreed to services and consent obtained.  Patient Care Team: Isaac Bliss, Rayford Halsted, MD as PCP - General (Internal Medicine) Magrinat, Virgie Dad, MD as Consulting Physician (Oncology) Allyn Kenner, MD (Dermatology) Excell Seltzer, MD (Inactive) as Consulting Physician (General Surgery) Everlene Farrier, MD as Consulting Physician (Obstetrics and Gynecology) Nickie Retort, MD (Inactive) as Consulting Physician (Urology) Susa Day, MD as Consulting  Physician (Orthopedic Surgery) Viona Gilmore, [facility] as Pharmacist (Pharmacist)  Recent office visits: 10/29/21 Isaac Bliss, Rayford Halsted, MD - Patient presented for frequent falls and other concerns. No medication changes.   09/03/21 Isaac Bliss, Rayford Halsted, MD - Patient presented for Cellulitis and abscess of left leg and other concerns. Prescribed Doxycycline 100 mg   07/23/21 Isaac Bliss, Rayford Halsted, MD - Patient presented for chronic pain disorder and other concerns. Changed Amlodipine to 5 mg daily.  Recent consult visits: 07/31/21 Teodoro Spray [facility]) - Patient presented for eye exam no other visit details available.   07/29/21 Marchia Bond (Orthopedic Surg) - Patient presented for osteoarthritis of left knee and other concerns. No other visit details available.   05/27/21 [facility] - Patient presented for age related nuclear cataract of left eye. No other visit details available.  Hospital visits: Medication Reconciliation was completed by comparing discharge summary, patients EMR and Pharmacy list, and upon discussion with patient.   Patient presented to [place] ED on 08/22/21 due to Cellulitis. Patient was present for 4 hours.   New?Medications Started at [facility] Discharge:?? -started doxycycline 100 MG capsule   Medication Changes at Hospital Discharge: -Changed  None   Medications Discontinued at Hospital Discharge: -Stopped  None   Medications that remain the same after Hospital Discharge:??  -All other medications will remain the same.     Medication Reconciliation was completed by comparing discharge summary, patients EMR and Pharmacy list, and upon discussion with patient.       Patient presented to [facility] ED on 9/6//22 due to [facility] Patient was present for 1 hour.   New?Medications Started at  Hospital Discharge:?? -started  None   Medication Changes at Hospital Discharge: -Changed   None   Medications Discontinued at Hospital Discharge: -Stopped  None   Medications that remain the same after Hospital Discharge:??  -All other medications will remain the same.    Objective:  Lab Results  Component Value Date   CREATININE 0.78 08/22/2021   BUN 18 08/22/2021   GFR 73.00 04/13/2020   GFRNONAA >60 08/22/2021   GFRAA >60 06/15/2019   NA 140 08/22/2021   K 3.8 08/22/2021   CALCIUM 9.6 08/22/2021   CO2 25 08/22/2021   GLUCOSE 106 (H) 08/22/2021    Lab Results  Component Value Date/Time   HGBA1C 5.8 04/13/2020 09:29 AM   HGBA1C 5.4 06/29/2017 11:35 AM   GFR 73.00 04/13/2020 09:29 AM   GFR 70.03 08/02/2019 11:04 AM    Last diabetic Eye exam: No results found for: HMDIABEYEEXA  Last diabetic Foot exam: No results found for: HMDIABFOOTEX   Lab Results  Component Value Date   CHOL 196 04/13/2020   HDL 60.40 04/13/2020   LDLCALC 104 (H) 04/13/2020   LDLDIRECT 175.0 12/02/2017   TRIG 158.0 (H) 04/13/2020   CHOLHDL 3 04/13/2020    Hepatic Function Latest Ref Rng & Units 08/22/2021 04/13/2020 06/11/2018  Total Protein 6.5 - 8.1 g/dL 7.9 6.5 6.4(L)  Albumin 3.5 - 5.0 g/dL 4.5 3.9 3.4(L)  AST 15 - 41 U/L '18 14 18  ' ALT 0 - 44 U/L '22 12 17  ' Alk Phosphatase 38 - 126 U/L 80 93 76  Total Bilirubin 0.3 - 1.2 mg/dL 0.4 0.4 0.7  Bilirubin, Direct 0.0 - 0.3 mg/dL - - -    Lab Results  Component Value Date/Time   TSH 2.27 09/21/2014 08:19 AM   TSH 2.21 09/21/2013 08:53 AM   FREET4 1.13 08/02/2011 08:34 AM    CBC Latest Ref Rng & Units 08/22/2021 04/13/2020 06/15/2019  WBC 4.0 - 10.5 K/uL 5.3 4.7 7.0  Hemoglobin 12.0 - 15.0 g/dL 14.6 12.3 13.4  Hematocrit 36.0 - 46.0 % 45.0 37.2 41.4  Platelets 150 - 400 K/uL 238 219.0 207    Lab Results  Component Value Date/Time   VD25OH 50.37 04/13/2020 09:29 AM   VD25OH 46.01 02/17/2017 10:29 AM    Clinical ASCVD: {YES/NO:21197} The 10-year ASCVD risk score (Arnett DK, et al., 2019) is: 18%   Values used to  calculate the score:     Age: 39 years     Sex: Female     Is Non-Hispanic African American: No     Diabetic: No     Tobacco smoker: No     Systolic Blood Pressure: 935 mmHg     Is BP treated: Yes     HDL Cholesterol: 60.4 mg/dL     Total Cholesterol: 196 mg/dL    Depression screen [facility] 2/9 10/29/2021 01/23/2021 07/13/2020  Decreased Interest 0 2 0  Down, Depressed, Hopeless 0 2 0  PHQ - 2 Score 0 4 0  Altered sleeping 0 0 0  Tired, decreased energy 0 1 0  Change in appetite 0 3 0  Feeling bad or failure about yourself  0 0 0  Trouble concentrating 0 0 0  Moving slowly or fidgety/restless 3 0 0  Suicidal thoughts 0 0 0  PHQ-9 Score 3 8 0  Difficult doing work/chores - Somewhat difficult Not difficult at all  Some recent data might be hidden     ***Other: (CHADS2VASc if Afib, MMRC or CAT for COPD,  ACT, DEXA)  Social History   Tobacco Use  Smoking Status Never  Smokeless Tobacco Never   BP Readings from Last 3 Encounters:  10/29/21 124/80  09/03/21 130/80  08/23/21 (!) 167/90   Pulse Readings from Last 3 Encounters:  10/29/21 60  09/03/21 74  08/23/21 60   Wt Readings from Last 3 Encounters:  10/29/21 238 lb 11.2 oz (108.3 kg)  08/22/21 250 lb (113.4 kg)  07/23/21 235 lb 3.2 oz (106.7 kg)   BMI Readings from Last 3 Encounters:  10/29/21 39.72 kg/m  08/22/21 41.60 kg/m  07/23/21 39.14 kg/m    Assessment/Interventions: Review of patient past medical history, allergies, medications, health status, including review of consultants reports, laboratory and other test data, was performed as part of comprehensive evaluation and provision of chronic care management services.   SDOH:  (Social Determinants of Health) assessments and interventions performed: {yes/no:20286}  SDOH Screenings   Alcohol Screen: Not on file  Depression (PHQ2-9): Low Risk    PHQ-2 Score: 3  Financial Resource Strain: Not on file  Food Insecurity: Not on file  Housing: Not on file   Physical Activity: Not on file  Social Connections: Not on file  Stress: Not on file  Tobacco Use: Low Risk    Smoking Tobacco Use: Never   Smokeless Tobacco Use: Never   Passive Exposure: Not on file  Transportation Needs: Not on file    [place]  No Known Allergies  Medications Reviewed Today     Reviewed by Isaac Bliss, Rayford Halsted, MD (Physician) on 10/29/21 at [place] List Status: <None>   Medication Order Taking? Sig Documenting Provider Last Dose Status Informant  albuterol (VENTOLIN HFA) 108 (90 Base) MCG/ACT inhaler [id number] Yes TAKE 2 PUFFS BY MOUTH EVERY 6 HOURS AS NEEDED FOR WHEEZE OR SHORTNESS OF BREATH Isaac Bliss, Rayford Halsted, MD Taking Active   amLODipine (NORVASC) 5 MG tablet [id number] Yes Take 1 tablet (5 mg total) by mouth daily. Isaac Bliss, Rayford Halsted, MD Taking Active   ASPIRIN LOW DOSE 81 MG EC tablet [id number] Yes TAKE 1 TABLET BY MOUTH EVERYDAY AT BEDTIME Isaac Bliss, Rayford Halsted, MD Taking Active   atorvastatin (LIPITOR) 20 MG tablet [id number] Yes TAKE 1 TABLET BY MOUTH EVERY DAY Isaac Bliss, Rayford Halsted, MD Taking Active   buPROPion (WELLBUTRIN XL) 300 MG 24 hr tablet [id number] Yes TAKE 1 TABLET BY MOUTH EVERY DAY Isaac Bliss, Rayford Halsted, MD Taking Active   carvedilol (COREG) 12.5 MG tablet [id number] Yes TAKE 1 TABLET BY MOUTH TWICE A DAY WITH MEALS Isaac Bliss, Rayford Halsted, MD Taking Active   Cholecalciferol (VITAMIN D3) 125 MCG (5000 UT) TABS [id number] Yes TAKE 1 TABLET BY MOUTH EVERY DAY Isaac Bliss, Rayford Halsted, MD Taking Active   clindamycin (CLINDAGEL) 1 % gel [id number] Yes Apply topically 2 (two) times daily.  Patient taking differently: Apply 1 application topically 2 (two) times daily as needed (rash).   Magrinat, Virgie Dad, MD Taking Active   furosemide (LASIX) 40 MG tablet [id number] Yes TAKE 1/2 TABLET BY MOUTH DAILY AS NEEDED FOR EDEMA Isaac Bliss, Rayford Halsted, MD Taking Active   HYDROcodone-acetaminophen  (NORCO/VICODIN) 5-325 MG tablet [id number]  Take 1 tablet by mouth every 6 (six) hours as needed for moderate pain. Isaac Bliss, Rayford Halsted, MD  Active   HYDROcodone-acetaminophen (NORCO/VICODIN) 5-325 MG tablet [id number]  Take 1 tablet by mouth every 6 (six) hours as needed for moderate pain. Isaac Bliss, Rayford Halsted, MD  Active  HYDROcodone-acetaminophen (NORCO/VICODIN) 5-325 MG tablet [id number]  Take 1 tablet by mouth every 6 (six) hours as needed for moderate pain. Isaac Bliss, Rayford Halsted, MD  Active   LINZESS 290 MCG CAPS capsule [id number] Yes TAKE 1 CAPSULE BY MOUTH EVERY DAY Isaac Bliss, Rayford Halsted, MD Taking Active   losartan (COZAAR) 100 MG tablet [id number] Yes TAKE 1 TABLET BY MOUTH EVERY DAY Isaac Bliss, Rayford Halsted, MD Taking Active   meclizine (ANTIVERT) 25 MG tablet [id number] Yes TAKE 1 TABLET BY MOUTH DAILY AS NEEDED FOR DIZZINESS Isaac Bliss, Rayford Halsted, MD Taking Active   Multiple Vitamins-Minerals ([place]) [place] [id number] Yes TAKE 1 TABLET BY MOUTH EVERY DAY Isaac Bliss, Rayford Halsted, MD Taking Active   pantoprazole ([place]) 40 MG tablet [id number] Yes TAKE 1 TABLET BY MOUTH EVERY DAY Isaac Bliss, Rayford Halsted, MD Taking Active   traZODone (DESYREL) 50 MG tablet [id number] Yes TAKE 1 TABLET BY MOUTH AT BEDTIME AS NEEDED FOR SLEEP. Rigoberto Noel, MD Taking Active   traZODone (DESYREL) 50 MG tablet [id number] Yes Take 1 tablet (50 mg total) by mouth at bedtime as needed for sleep. Melvenia Needles, NP Taking Active   venlafaxine XR (EFFEXOR-XR) 150 MG 24 hr capsule [id number] Yes TAKE 1 CAPSULE BY MOUTH EVERY DAY WITH BREAKFAST Causey, Charlestine Massed, NP Taking Active             Patient Active Problem List   Diagnosis Date Noted   History of colonic polyps    Polyp of rectum    Difficult intubation 05/29/2020   Chronic vulvitis 04/20/2020   Status post abdominoplasty 03/13/2020   History of bariatric surgery 03/13/2020   Mixed conductive and  sensorineural hearing loss of both ears 10/24/2019   Sensorineural hearing loss (SNHL), bilateral 10/24/2019   Chronic pain disorder 09/10/2018   Compression fracture of thoracic vertebra (HCC) 07/13/2018   Panniculitis 06/18/2018   Asthma, intermittent, uncomplicated 11/23/8249   GERD (gastroesophageal reflux disease) 03/02/2018   Ductal carcinoma in situ (DCIS) of left breast 03/01/2018   Degeneration of lumbar intervertebral disc 12/25/2017   Spinal stenosis of lumbar region 12/25/2017   Intertriginous candidiasis 10/02/2017   Polyp of transverse colon    Special screening for malignant neoplasms, colon    Vitamin D deficiency 02/16/2017   RLS (restless legs syndrome) 09/09/2016   Insomnia 07/11/2016   Anxiety and depression 07/11/2016   Generalized osteoarthritis of multiple sites 07/11/2016   Class 2 obesity with body mass index (BMI) of 36.0 to 36.9 in adult 01/28/2016   Shoulder pain 12/28/2014   Osteopenia 12/28/2014   Hot flashes 08/02/2014   Constipation 08/02/2014   Malignant neoplasm of upper-outer quadrant of left breast in female, estrogen receptor positive ([place]) 03/24/2014   CANDIDIASIS, SKIN 10/15/2009   PURE HYPERCHOLESTEROLEMIA 04/25/2009   Hyperlipidemia, acquired 02/16/2009   Sleep apnea 02/16/2009   Depression 06/18/2007   Essential hypertension 06/18/2007    Immunization History  Administered Date(s) Administered   Fluad Quad(high Dose 65+) 08/31/2019, 10/11/2020, 09/03/2021   Influenza Split 08/09/2012   Influenza, High Dose Seasonal PF 10/22/2015, 09/09/2016, 09/14/2017, 09/10/2018   Influenza,inj,Quad PF,6+ Mos 09/27/2013, 10/02/2014   Influenza,inj,quad, With Preservative 07/01/2017   Janssen (J&J) SARS-COV-2 Vaccination 01/15/2020   PFIZER(Purple Top)SARS-COV-2 Vaccination 11/12/2020   Pneumococcal Conjugate-13 10/22/2015, 07/01/2017   Pneumococcal Polysaccharide-23 02/16/2009, 10/26/2019   Td 07/09/2006   Tdap 09/09/2016   Zoster Recombinat  (Shingrix) 01/14/2021   Zoster, Live 08/09/2012    Conditions to be addressed/monitored:  Hypertension, Hyperlipidemia,  GERD, Asthma, Depression, Anxiety, Osteopenia, Osteoarthritis, and Restless legs syndrome  There are no care plans that you recently modified to display for this patient.   Current Barriers:  {pharmacybarriers:24917}  Pharmacist Clinical Goal(s):  Patient will {PHARMACYGOALCHOICES:24921} through collaboration with PharmD and provider.   Interventions: 1:1 collaboration with Isaac Bliss, Rayford Halsted, MD regarding development and update of comprehensive plan of care as evidenced by provider attestation and co-signature Inter-disciplinary care team collaboration (see longitudinal plan of care) Comprehensive medication review performed; medication list updated in electronic medical record BP Readings from Last 3 Encounters:  10/29/21 124/80  09/03/21 130/80  08/23/21 (!) 167/90     Hypertension (BP goal <140/90) -Controlled -Current treatment: Amlodipine 5 mg 1 tablet daily Losartan 100 mg 1 tablet daily -Medications previously tried: ***  -Current home readings: *** -Current dietary habits: *** -Current exercise habits: *** -{ACTIONS;DENIES/REPORTS:21021675::"Denies"} hypotensive/hypertensive symptoms -Educated on {CCM BP Counseling:25124} -Counseled to monitor BP at home ***, document, and provide log at future appointments -{CCMPHARMDINTERVENTION:25122}  Lab Results  Component Value Date   CHOL 196 04/13/2020   HDL 60.40 04/13/2020   LDLCALC 104 (H) 04/13/2020   LDLDIRECT 175.0 12/02/2017   TRIG 158.0 (H) 04/13/2020   CHOLHDL 3 04/13/2020    Hyperlipidemia: (LDL goal < 100) -Not ideally controlled -Current treatment: Atorvastatin 20 mg 1 tablet daily -Medications previously tried: ***  -Current dietary patterns: *** -Current exercise habits: *** -Educated on {CCM HLD Counseling:25126} -{CCMPHARMDINTERVENTION:25122}  Asthma (Goal: control  symptoms and prevent exacerbations) -{US controlled/uncontrolled:25276} -Current treatment  Albuterol HFA 2 puffs as needed -Medications previously tried: ***  -Gold Grade: {CHL HP Upstream Pharm COPD Gold HBZJI:[id number]} -Current COPD Classification:  {CHL HP Upstream Pharm COPD [phone]} -MMRC/CAT score: *** -Pulmonary function testing: *** -Exacerbations requiring treatment in last 6 months: *** -Patient {Actions; denies-reports:120008} consistent use of maintenance inhaler -Frequency of rescue inhaler use: *** -Counseled on {CCMINHALERCOUNSELING:25121} -{CCMPHARMDINTERVENTION:25122} SOB?  Depression/Anxiety (Goal: ***) -{US controlled/uncontrolled:25276} -Current treatment: Venlafaxine XR 150 mg 1 capsule every day with breakfast Bupropion XL 300 mg 1 tablet daily -Medications previously tried/failed: *** -PHQ9: *** -GAD7: *** -Connected with *** for mental health support -Educated on {CCM mental health counseling:25127} -{CCMPHARMDINTERVENTION:25122}  Insomnia (Goal: ***) -{US controlled/uncontrolled:25276} -Current treatment  Trazodone 50 mg 1 tablet as needed at bedtime -Medications previously tried: ***  -{CCMPHARMDINTERVENTION:25122}   Osteopenia (Goal ***) -{US controlled/uncontrolled:25276} -Last DEXA Scan: 2019   T-Score femoral neck: -1.6  T-Score total hip: n/a  T-Score lumbar spine: n/a  T-Score forearm radius: -1.3  10-year probability of major osteoporotic fracture: 9.1%  10-year probability of hip fracture: 1.2% -Patient {is;is not an osteoporosis candidate:23886} -Current treatment  Multivitamin ? Vitamin D 125 mcg daily -Medications previously tried: ***  -{Osteoporosis Counseling:23892} -{CCMPHARMDINTERVENTION:25122} -repeat DEXA? Repeat vitamin D?  Last vitamin D Lab Results  Component Value Date   VD25OH 50.37 04/13/2020    Pain/osteoarthritis? (Goal: ***) -{US controlled/uncontrolled:25276} -Current treatment   Hydrocodone 5-325 mg 1 tablet every 6 hours as needed -Medications previously tried: ***  -{CCMPHARMDINTERVENTION:25122}  GERD (Goal: ***) -{US controlled/uncontrolled:25276} -Current treatment  Pantoprazole 40 mg 1 tablet daily -Medications previously tried: ***  -{CCMPHARMDINTERVENTION:25122} -need?  Swelling? (Goal: ***) -{US controlled/uncontrolled:25276} -Current treatment  Furosemide 40 mg 1/2 tablet daily? -Medications previously tried: ***  -{CCMPHARMDINTERVENTION:25122}    Health Maintenance -Vaccine gaps: COVID booster -Current therapy:  Clindamycin 1% gel apply as needed Linzess 290 mcg 1 capsule daily - taking? Aspirin 81 mg 1 tablet daily - need? Multivitamin 1 tablet daily -Educated on {ccm  supplement counseling:25128} -{CCM Patient satisfied:25129} -{CCMPHARMDINTERVENTION:25122}  Patient Goals/Self-Care Activities Patient will:  - {pharmacypatientgoals:24919}  Follow Up Plan: {CM FOLLOW UP SCXA:03905}   Medication Assistance: {MEDASSISTANCEINFO:25044}  Compliance/Adherence/Medication fill history: Care Gaps: BP- 124/80 (10/29/21) COVID booster, Hep C screening  Star-Rating Drugs: Atorvastatin 20 mg - [facility]21 [facility]  Losartan 100 mg - [facility]   [facility]  [id number] [place] [place]: [phone]: [phone] [address] [facility] [address] [place]Phone: [phone]: [phone] Uses pill box? {Yes or If no, why not?:20788} Pt endorses ***% compliance  We discussed: {Pharmacy options:24294} [facility] and Follow Up Patient Decision:  {FOLLOWUP:24991}  Plan: {CM FOLLOW UP PIQJS:55476} MJeni Salles PharmD,  [facility] Farmington HillsClinical Pharmacist {MP practice sites:26434} [phone]

## 2021-11-20 ENCOUNTER — Ambulatory Visit: Payer: Medicare HMO

## 2021-11-20 DIAGNOSIS — M542 Cervicalgia: Secondary | ICD-10-CM | POA: Diagnosis not present

## 2021-11-20 DIAGNOSIS — M545 Low back pain, unspecified: Secondary | ICD-10-CM | POA: Diagnosis not present

## 2021-11-20 DIAGNOSIS — M160 Bilateral primary osteoarthritis of hip: Secondary | ICD-10-CM | POA: Diagnosis not present

## 2021-11-21 ENCOUNTER — Other Ambulatory Visit: Payer: Self-pay | Admitting: Internal Medicine

## 2021-11-21 DIAGNOSIS — R600 Localized edema: Secondary | ICD-10-CM

## 2021-11-22 ENCOUNTER — Telehealth: Payer: Self-pay | Admitting: Internal Medicine

## 2021-11-22 NOTE — Telephone Encounter (Signed)
FYI

## 2021-11-22 NOTE — Telephone Encounter (Signed)
Tamara [name] PTA [facility] is calling to report pt has a missed visit for today. Pt decline the visit for today

## 2021-12-18 DIAGNOSIS — M545 Low back pain, unspecified: Secondary | ICD-10-CM | POA: Diagnosis not present

## 2021-12-18 DIAGNOSIS — M160 Bilateral primary osteoarthritis of hip: Secondary | ICD-10-CM | POA: Diagnosis not present

## 2021-12-27 ENCOUNTER — Other Ambulatory Visit: Payer: Self-pay | Admitting: Internal Medicine

## 2021-12-27 ENCOUNTER — Other Ambulatory Visit: Payer: Self-pay | Admitting: Adult Health

## 2021-12-27 DIAGNOSIS — E559 Vitamin D deficiency, unspecified: Secondary | ICD-10-CM

## 2021-12-30 ENCOUNTER — Other Ambulatory Visit: Payer: Self-pay | Admitting: Internal Medicine

## 2021-12-30 DIAGNOSIS — I10 Essential (primary) hypertension: Secondary | ICD-10-CM

## 2021-12-30 DIAGNOSIS — F32A Depression, unspecified: Secondary | ICD-10-CM

## 2021-12-30 DIAGNOSIS — F419 Anxiety disorder, unspecified: Secondary | ICD-10-CM

## 2022-01-08 ENCOUNTER — Encounter: Payer: Self-pay | Admitting: Internal Medicine

## 2022-01-08 DIAGNOSIS — G894 Chronic pain syndrome: Secondary | ICD-10-CM

## 2022-01-08 DIAGNOSIS — M159 Polyosteoarthritis, unspecified: Secondary | ICD-10-CM

## 2022-01-09 MED ORDER — HYDROCODONE-ACETAMINOPHEN 5-325 MG PO TABS: 1.0000 | ORAL_TABLET | Freq: Four times a day (QID) | ORAL | 0 refills | Status: DC | PRN

## 2022-01-10 ENCOUNTER — Telehealth: Payer: Self-pay | Admitting: Internal Medicine

## 2022-01-10 NOTE — Telephone Encounter (Signed)
[facility] in [facility] and need medication   HYDROcodone-acetaminophen (NORCO/VICODIN) 5-325 MG tablet to  [facility], [address] AT [address] [place] Phone:  [phone]  Fax:  [phone]

## 2022-01-11 ENCOUNTER — Encounter: Payer: Self-pay | Admitting: Internal Medicine

## 2022-01-13 ENCOUNTER — Telehealth: Payer: Self-pay | Admitting: *Deleted

## 2022-01-13 NOTE — Telephone Encounter (Signed)
Patient would like to switch from Dr Jerilee Hoh to Humberto Seals NP.  Okay to switch?

## 2022-01-13 NOTE — Telephone Encounter (Signed)
Rx sent 

## 2022-01-16 ENCOUNTER — Encounter: Payer: Self-pay | Admitting: Internal Medicine

## 2022-01-26 ENCOUNTER — Other Ambulatory Visit: Payer: Self-pay | Admitting: Internal Medicine

## 2022-01-28 ENCOUNTER — Telehealth: Payer: Self-pay | Admitting: Pharmacist

## 2022-01-28 ENCOUNTER — Encounter: Payer: Self-pay | Admitting: Internal Medicine

## 2022-01-28 ENCOUNTER — Ambulatory Visit (INDEPENDENT_AMBULATORY_CARE_PROVIDER_SITE_OTHER): Payer: Medicare Other | Admitting: Internal Medicine

## 2022-01-28 VITALS — BP 170/110 | HR 66 | Temp 98.2°F | Wt 236.1 lb

## 2022-01-28 DIAGNOSIS — G894 Chronic pain syndrome: Secondary | ICD-10-CM | POA: Diagnosis not present

## 2022-01-28 DIAGNOSIS — M159 Polyosteoarthritis, unspecified: Secondary | ICD-10-CM

## 2022-01-28 DIAGNOSIS — I10 Essential (primary) hypertension: Secondary | ICD-10-CM | POA: Diagnosis not present

## 2022-01-28 MED ORDER — HYDROCODONE-ACETAMINOPHEN 5-325 MG PO TABS: 1.0000 | ORAL_TABLET | Freq: Four times a day (QID) | ORAL | 0 refills | Status: DC | PRN

## 2022-01-28 NOTE — Chronic Care Management (AMB) (Signed)
Chronic Care Management Pharmacy Assistant   Name: Tamara [name]  MRN: [id number] DOB: 02/01/1951  Reason for Encounter: Offer to reschedule Missed Initial Encounter with [place] Pharmacist   Recent office visits:  01/28/22 Isaac Bliss, Rayford Halsted, MD - Patient presented for Chronic pain disorder and other concerns. No medication changes.  Recent consult visits:  11/20/21 Merlene Pulling K (Orthopedic Surg) - Patient presented for Cervaclagia. No other visit details available.  11/20/21 Marchia Bond (Orthopedic Surg) - Patient presented for Low back pain and other concerns. No other visit details available  Hospital visits:  Medication Reconciliation was completed by comparing discharge summary, patients EMR and Pharmacy list, and upon discussion with patient.   Patient presented to [facility] ED on 08/22/21 due to Cellulitis. Patient was present for 4 hours.   New?Medications Started at [facility] Discharge:?? -started doxycycline 100 MG capsule   Medication Changes at Hospital Discharge: -Changed  None   Medications Discontinued at Hospital Discharge: -Stopped  None   Medications that remain the same after Hospital Discharge:??  -All other medications will remain the same.     Medication Reconciliation was completed by comparing discharge summary, patients EMR and Pharmacy list, and upon discussion with patient.     Patient presented to [facility] ED on 9/6//22 due to [facility] Patient was present for 1 hour.   New?Medications Started at [facility] Discharge:?? -started  None   Medication Changes at Hospital Discharge: -Changed  None   Medications Discontinued at Hospital Discharge: -Stopped  None   Medications that remain the same after Hospital Discharge:??  -All other medications will remain the same.      Medications: Outpatient Encounter Medications as of 01/28/2022  Medication Sig   albuterol (VENTOLIN  HFA) 108 (90 Base) MCG/ACT inhaler TAKE 2 PUFFS BY MOUTH EVERY 6 HOURS AS NEEDED FOR WHEEZE OR SHORTNESS OF BREATH   amLODipine (NORVASC) 5 MG tablet TAKE 1 TABLET (5 MG TOTAL) BY MOUTH DAILY.   ASPIRIN LOW DOSE 81 MG EC tablet TAKE 1 TABLET BY MOUTH EVERYDAY AT BEDTIME   atorvastatin (LIPITOR) 20 MG tablet TAKE 1 TABLET BY MOUTH EVERY DAY   buPROPion (WELLBUTRIN XL) 300 MG 24 hr tablet TAKE 1 TABLET BY MOUTH EVERY DAY   carvedilol (COREG) 12.5 MG tablet TAKE 1 TABLET BY MOUTH TWICE A DAY WITH MEALS   Cholecalciferol (VITAMIN D3) 125 MCG (5000 UT) TABS TAKE 1 TABLET BY MOUTH EVERY DAY   clindamycin (CLINDAGEL) 1 % gel Apply topically 2 (two) times daily. (Patient taking differently: Apply 1 application topically 2 (two) times daily as needed (rash).)   furosemide (LASIX) 40 MG tablet TAKE 1/2 TABLET BY MOUTH DAILY AS NEEDED FOR EDEMA   HYDROcodone-acetaminophen (NORCO/VICODIN) 5-325 MG tablet Take 1 tablet by mouth every 6 (six) hours as needed for moderate pain.   HYDROcodone-acetaminophen (NORCO/VICODIN) 5-325 MG tablet Take 1 tablet by mouth every 6 (six) hours as needed for moderate pain.   HYDROcodone-acetaminophen (NORCO/VICODIN) 5-325 MG tablet Take 1 tablet by mouth every 6 (six) hours as needed for moderate pain.   LINZESS 290 MCG CAPS capsule TAKE 1 CAPSULE BY MOUTH EVERY DAY   losartan (COZAAR) 100 MG tablet TAKE 1 TABLET BY MOUTH EVERY DAY   meclizine (ANTIVERT) 25 MG tablet TAKE 1 TABLET BY MOUTH DAILY AS NEEDED FOR DIZZINESS   Multiple Vitamins-Minerals (CERTAVITE SENIOR/ANTIOXIDANT) TABS TAKE 1 TABLET BY MOUTH EVERY DAY   pantoprazole (PROTONIX) 40 MG tablet TAKE  1 TABLET BY MOUTH EVERY DAY   traZODone (DESYREL) 50 MG tablet TAKE 1 TABLET BY MOUTH AT BEDTIME AS NEEDED FOR SLEEP.   traZODone (DESYREL) 50 MG tablet Take 1 tablet (50 mg total) by mouth at bedtime as needed for sleep.   venlafaxine XR (EFFEXOR-XR) 150 MG 24 hr capsule TAKE 1 CAPSULE BY MOUTH EVERY DAY WITH BREAKFAST    No facility-administered encounter medications on file as of 01/28/2022.   Notes:  Reached out to patient to offer to reschedule missed initial encounter with [place] Pharmacist. Did not reach left voicemail with my contact information for return call   Care Gaps: Hepatitis C Screening - Overdue BP- 170/110 ( 01/28/22) AWV- 5/21  Star Rating Drugs: Atorvastatin 20 mg - Last filled 01/14/22 30 DS at [facility]  Losartan 100 mg - Last filled 01/01/22 90 DS at [place] Pharmacist Assistant [phone]

## 2022-01-28 NOTE — Progress Notes (Signed)
Established Patient Office Visit     This visit occurred during the SARS-CoV-2 public health emergency.  Safety protocols were in place, including screening questions prior to the visit, additional usage of staff PPE, and extensive cleaning of exam room while observing appropriate contact time as indicated for disinfecting solutions.    CC/Reason for Visit: Medication refills  HPI: Tamara [name] is a 71 y.o. female who is coming in today for the above mentioned reasons. Past Medical History is significant for: Morbid obesity, hypertension, GERD, hyperlipidemia, prior breast cancer, obstructive sleep apnea and vitamin D deficiency.  She is feeling well and has no acute concerns or complaints today.  She is noted to have elevated blood pressure to 170/110 in office today.  She needs refills of her hydrocodone for which she has a signed pain contract for.   Past Medical/Surgical History: Past Medical History:  Diagnosis Date   Allergy    seasonal allergies (Fall)   Anxiety    on meds   Arthritis    knees    Asthma    rare;only when around alot of dust-Ventolin inhaler as needed   Breast cancer ([place])     left. States she did not have lymph nodes removed   Bursitis of right shoulder    Cataract    not had sx as of yet   Cellulitis and abscess of leg    Complication of anesthesia    was told 01/06/14 that airway was small   Depression    takes Effexor and Wellbutrin daily   GERD (gastroesophageal reflux disease)    on meds   Hearing loss    History of bronchitis 1966   Hyperlipidemia    on meds   Hypertension    takes Coreg and Losartan daily   Joint pain    Joint swelling    Neuromuscular disorder (HCC)    RLS   Obese    Peripheral edema    takes Furosemide daily as needed and Potassium daily   Personal history of radiation therapy 2015   Radiation 03/08/14-04/26/14   50.4 gray to left breast. Lumpectomy cavity boosted to 64.4 gray   Restless legs syndrome     takes depakote   Sleep apnea    uses CPAP   Sleep apnea, obstructive    uses CPAP   Wears glasses     Past Surgical History:  Procedure Laterality Date   BREAST LUMPECTOMY Left 2015   BREAST LUMPECTOMY WITH NEEDLE LOCALIZATION Left 01/24/2014   Procedure: BREAST LUMPECTOMY WITH NEEDLE LOCALIZATION;  Surgeon: Edward Jolly, MD;  Location: [facility];  Service: General;  Laterality: Left;   COLONOSCOPY N/A 03/20/2017   Procedure: COLONOSCOPY;  Surgeon: Mauri Pole, MD;  Location: [facility];  Service: Endoscopy;  Laterality: N/A;   COLONOSCOPY  2018   COLONOSCOPY WITH PROPOFOL N/A 08/21/2020   Procedure: COLONOSCOPY WITH PROPOFOL;  Surgeon: Mauri Pole, MD;  Location: [facility];  Service: Endoscopy;  Laterality: N/A;   DILATATION & CURETTAGE/HYSTEROSCOPY WITH TRUECLEAR N/A 01/06/2014   Procedure: DILATATION & CURETTAGE/HYSTEROSCOPY WITH TRUCLEAR;  Surgeon: Shon Millet II, MD;  Location: [facility];  Service: Gynecology;  Laterality: N/A;   HYSTEROPLASTY  01/2014   INNER EAR SURGERY Bilateral    for hearing loss   JOINT REPLACEMENT Right    Knee   LAPAROSCOPIC GASTRIC SLEEVE RESECTION N/A 01/28/2016   Procedure: LAPAROSCOPIC GASTRIC SLEEVE RESECTION WITH UPPER ENDO;  Surgeon: Excell Seltzer, MD;  Location:  [facility];  Service: General;  Laterality: N/A;   LIPOSUCTION WITH LIPOFILLING Bilateral 06/20/2019   Procedure: BILATERAL THIGH LIPECTOMY;  Surgeon: Irene Limbo, MD;  Location: [facility];  Service: Plastics;  Laterality: Bilateral;   PANNICULECTOMY N/A 06/18/2018   Procedure: PANNICULECTOMY;  Surgeon: Irene Limbo, MD;  Location: [facility];  Service: Plastics;  Laterality: N/A;   POLYPECTOMY  08/21/2020   Procedure: POLYPECTOMY;  Surgeon: Mauri Pole, MD;  [facility]: [facility];  Service: Endoscopy;;   RE-EXCISION OF BREAST LUMPECTOMY Left 02/02/2014   Procedure: RE-EXCISION OF LEFT BREAST LUMPECTOMY;  Surgeon: Edward Jolly, MD;   Location: [facility];  Service: General;  Laterality: Left;   [place] EXTRACTION  1970    Social History:  reports that she has never smoked. She has never used smokeless tobacco. She reports that she does not drink alcohol and does not use drugs.  Allergies: No Known Allergies  Family History:  Family History  Problem Relation Age of Onset   Dementia Mother 19   Heart attack Mother 56   Esophageal cancer Father 13   Heart attack Father 16   Stomach cancer Father 55   Colon polyps Brother    Asthma Other    Hypertension Other    Thyroid disease Other    Heart attack Other    Throat cancer Paternal Grandfather    Colon cancer Neg Hx    Rectal cancer Neg Hx      Current Outpatient Medications:    albuterol (VENTOLIN HFA) 108 (90 Base) MCG/ACT inhaler, TAKE 2 PUFFS BY MOUTH EVERY 6 HOURS AS NEEDED FOR WHEEZE OR SHORTNESS OF BREATH, Disp: 36 each, Rfl: 1   amLODipine (NORVASC) 5 MG tablet, TAKE 1 TABLET (5 MG TOTAL) BY MOUTH DAILY., Disp: 90 tablet, Rfl: 1   ASPIRIN LOW DOSE 81 MG EC tablet, TAKE 1 TABLET BY MOUTH EVERYDAY AT BEDTIME, Disp: 30 tablet, Rfl: 8   atorvastatin (LIPITOR) 20 MG tablet, TAKE 1 TABLET BY MOUTH EVERY DAY, Disp: 90 tablet, Rfl: 1   buPROPion (WELLBUTRIN XL) 300 MG 24 hr tablet, TAKE 1 TABLET BY MOUTH EVERY DAY, Disp: 90 tablet, Rfl: 1   carvedilol (COREG) 12.5 MG tablet, TAKE 1 TABLET BY MOUTH TWICE A DAY WITH MEALS, Disp: 180 tablet, Rfl: 1   Cholecalciferol (VITAMIN D3) 125 MCG (5000 UT) TABS, TAKE 1 TABLET BY MOUTH EVERY DAY, Disp: 30 tablet, Rfl: 8   clindamycin (CLINDAGEL) 1 % gel, Apply topically 2 (two) times daily. (Patient taking differently: Apply 1 application topically 2 (two) times daily as needed (rash).), Disp: 30 g, Rfl: 0   furosemide (LASIX) 40 MG tablet, TAKE 1/2 TABLET BY MOUTH DAILY AS NEEDED FOR EDEMA, Disp: 45 tablet, Rfl: 3   LINZESS 290 MCG CAPS capsule, TAKE 1 CAPSULE BY MOUTH EVERY DAY, Disp: 90 capsule, Rfl:  2   losartan (COZAAR) 100 MG tablet, TAKE 1 TABLET BY MOUTH EVERY DAY, Disp: 90 tablet, Rfl: 1   meclizine (ANTIVERT) 25 MG tablet, TAKE 1 TABLET BY MOUTH DAILY AS NEEDED FOR DIZZINESS, Disp: 30 tablet, Rfl: 2   Multiple Vitamins-Minerals (CERTAVITE SENIOR/ANTIOXIDANT) TABS, TAKE 1 TABLET BY MOUTH EVERY DAY, Disp: 90 tablet, Rfl: 1   pantoprazole (PROTONIX) 40 MG tablet, TAKE 1 TABLET BY MOUTH EVERY DAY, Disp: 90 tablet, Rfl: 1   traZODone (DESYREL) 50 MG tablet, TAKE 1 TABLET BY MOUTH AT BEDTIME AS NEEDED FOR SLEEP., Disp: 90 tablet, Rfl: 1  traZODone (DESYREL) 50 MG tablet, Take 1 tablet (50 mg total) by mouth at bedtime as needed for sleep., Disp: 90 tablet, Rfl: 1   venlafaxine XR (EFFEXOR-XR) 150 MG 24 hr capsule, TAKE 1 CAPSULE BY MOUTH EVERY DAY WITH BREAKFAST, Disp: 30 capsule, Rfl: 2   HYDROcodone-acetaminophen (NORCO/VICODIN) 5-325 MG tablet, Take 1 tablet by mouth every 6 (six) hours as needed for moderate pain., Disp: 60 tablet, Rfl: 0   HYDROcodone-acetaminophen (NORCO/VICODIN) 5-325 MG tablet, Take 1 tablet by mouth every 6 (six) hours as needed for moderate pain., Disp: 60 tablet, Rfl: 0   HYDROcodone-acetaminophen (NORCO/VICODIN) 5-325 MG tablet, Take 1 tablet by mouth every 6 (six) hours as needed for moderate pain., Disp: 60 tablet, Rfl: 0  Review of Systems:  Constitutional: Denies fever, chills, diaphoresis, appetite change and fatigue.  HEENT: Denies photophobia, eye pain, redness, hearing loss, ear pain, congestion, sore throat, rhinorrhea, sneezing, mouth sores, trouble swallowing, neck pain, neck stiffness and tinnitus.   Respiratory: Denies SOB, DOE, cough, chest tightness,  and wheezing.   Cardiovascular: Denies chest pain, palpitations and leg swelling.  Gastrointestinal: Denies nausea, vomiting, abdominal pain, diarrhea, constipation, blood in stool and abdominal distention.  Genitourinary: Denies dysuria, urgency, frequency, hematuria, flank pain and difficulty  urinating.  Endocrine: Denies: hot or cold intolerance, sweats, changes in hair or nails, polyuria, polydipsia. Musculoskeletal: Denies myalgias, back pain, joint swelling, arthralgias and gait problem.  Skin: Denies pallor, rash and wound.  Neurological: Denies dizziness, seizures, syncope, weakness, light-headedness, numbness and headaches.  Hematological: Denies adenopathy. Easy bruising, personal or family bleeding history  Psychiatric/Behavioral: Denies suicidal ideation, mood changes, confusion, nervousness, sleep disturbance and agitation    Physical Exam: Vitals:   01/28/22 0755 01/28/22 0821  BP: (!) 170/110 (!) 170/110  Pulse: 66   Temp: 98.2 F (36.8 C)   TempSrc: Oral   SpO2: 97%   Weight: 236 lb 1.6 oz (107.1 kg)     Body mass index is 39.29 kg/m.   Constitutional: NAD, calm, comfortable, ambulates with a walker Eyes: PERRL, lids and conjunctivae normal, wears corrective lenses ENMT: Mucous membranes are moist.  Respiratory: clear to auscultation bilaterally, no wheezing, no crackles. Normal respiratory effort. No accessory muscle use.  Cardiovascular: Regular rate and rhythm, no murmurs / rubs / gallops. No extremity edema.  Psychiatric: Normal judgment and insight. Alert and oriented x 3. Normal mood.    Impression and Plan:  Chronic pain disorder  Generalized osteoarthritis of multiple sites  -PDMP reviewed, no red flags, overdose risk score is 120. -Refill hydrocodone 5/325 mg to take 1 tablet twice a day for total of 60 tablets a month x3 months.  Primary hypertension -Blood pressure is elevated to 170/110 on 2 separate measurements in office today. -Although she does have a history of hypertension she has never been noted to be hypertensive while in the office. -She will do ambulatory measurements over the next 2 weeks and contact me at the end of that timeframe to further discuss.  Time spent: 30 minutes reviewing chart, interviewing and examining  patient and formulating plan of care.     Lelon Frohlich, MD [facility]

## 2022-03-20 ENCOUNTER — Encounter: Payer: Self-pay | Admitting: Internal Medicine

## 2022-04-09 DIAGNOSIS — M160 Bilateral primary osteoarthritis of hip: Secondary | ICD-10-CM | POA: Diagnosis not present

## 2022-04-09 DIAGNOSIS — M25511 Pain in right shoulder: Secondary | ICD-10-CM | POA: Diagnosis not present

## 2022-04-18 ENCOUNTER — Ambulatory Visit: Payer: Medicare Other | Admitting: Adult Health

## 2022-04-24 ENCOUNTER — Ambulatory Visit (INDEPENDENT_AMBULATORY_CARE_PROVIDER_SITE_OTHER): Payer: Medicare HMO | Admitting: Adult Health

## 2022-04-24 ENCOUNTER — Encounter: Payer: Self-pay | Admitting: Adult Health

## 2022-04-24 DIAGNOSIS — S8990XA Unspecified injury of unspecified lower leg, initial encounter: Secondary | ICD-10-CM | POA: Insufficient documentation

## 2022-04-24 DIAGNOSIS — G4700 Insomnia, unspecified: Secondary | ICD-10-CM | POA: Diagnosis not present

## 2022-04-24 DIAGNOSIS — G4730 Sleep apnea, unspecified: Secondary | ICD-10-CM

## 2022-04-24 DIAGNOSIS — S8991XA Unspecified injury of right lower leg, initial encounter: Secondary | ICD-10-CM

## 2022-04-24 NOTE — Assessment & Plan Note (Signed)
Healthy weight loss discussed 

## 2022-04-24 NOTE — Assessment & Plan Note (Addendum)
Right lower leg bruising.  Patient hit her right lower leg on the side of coffee table 4 days ago.  She has a bruised area with hematoma.  Skin is intact.  The patient is advised to keep leg elevated.  Warm compresses as needed. Advised to follow-up with primary care  Patient is on sedating medications have advised her to use with caution as she is prone to frequent falls with her balance issues, advised to use with caution.

## 2022-04-24 NOTE — Assessment & Plan Note (Signed)
Excellent control on nocturnal CPAP continue current settings  Plan  Patient Instructions  Keep up good work  Continue on CPAP At bedtime   Work on healthy weight  Do not drive if sleepy  Trazodone '50mg'$  At bedtime  For sleep , use with caution  Follow up in 1 year with Dr. Elsworth Soho  And As needed

## 2022-04-24 NOTE — Progress Notes (Signed)
$'@Patient'K$  ID: Tamara [name], female    DOB: 1951/02/22, 71 y.o.   MRN: [id number]  Chief Complaint  Patient presents with   Follow-up    Referring provider: Isaac Bliss, [place]*  HPI: 71 year old female followed for obstructive sleep apnea and insomnia Medical history significant for morbid obesity status post gastric sleeve surgery in 2016  TEST/EVENTS :  PSG 03/2013 - 291 lbs-  [name] OSA with AHI 66/hour which was corrected with CPAP of 9 cm.   She also had moderate PLM's 40/hour, however PLM arousal index was low at 0.9/hour.   05/2018 HST mild AHI 13/h , predom supine  04/24/2022 Follow up : Obstructive sleep apnea and insomnia Patient returns for a 1 year follow-up.  Patient has underlying sleep apnea is on CPAP at bedtime.  Patient says she is doing well on CPAP.  She wears her CPAP every single night.  Does not sleep without it.  Feels that she benefits from CPAP.  CPAP download shows excellent compliance with 100% usage.  Daily average usage at 10 hours.  AHI 5/hour.  Patient does have chronic insomnia.  Takes trazodone to help her sleep.  Says it is helping her get to sleep and stay asleep. Patient education on sleep aids given.  Lives at home alone. Drives . Uses walker. Has chronic balance issues.   Bruised right leg , hit on side of coffee table 4 days ago.  . Lost balance . Can bear weight . Decreased pain. No increased swelling . No redness.     No Known Allergies  Immunization History  Administered Date(s) Administered   Fluad Quad(high Dose 65+) 08/31/2019, 10/11/2020, 09/03/2021   Influenza Split 08/09/2012   Influenza, High Dose Seasonal PF 10/22/2015, 09/09/2016, 09/14/2017, 09/10/2018   Influenza,inj,Quad PF,6+ Mos 09/27/2013, 10/02/2014   Influenza,inj,quad, With Preservative 07/01/2017   Janssen (J&J) SARS-COV-2 Vaccination 01/15/2020   PFIZER(Purple Top)SARS-COV-2 Vaccination 11/12/2020   Pneumococcal Conjugate-13 10/22/2015, 07/01/2017    Pneumococcal Polysaccharide-23 02/16/2009, 10/26/2019   Td 07/09/2006   Tdap 09/09/2016   Zoster Recombinat (Shingrix) 01/14/2021   Zoster, Live 08/09/2012    Past Medical History:  Diagnosis Date   Allergy    seasonal allergies (Fall)   Anxiety    on meds   Arthritis    knees    Asthma    rare;only when around alot of dust-Ventolin inhaler as needed   Breast cancer ([place])     left. States she did not have lymph nodes removed   Bursitis of right shoulder    Cataract    not had sx as of yet   Cellulitis and abscess of leg    Complication of anesthesia    was told 01/06/14 that airway was small   Depression    takes Effexor and Wellbutrin daily   GERD (gastroesophageal reflux disease)    on meds   Hearing loss    History of bronchitis 1966   Hyperlipidemia    on meds   Hypertension    takes Coreg and Losartan daily   Joint pain    Joint swelling    Neuromuscular disorder ([place])    RLS   Obese    Peripheral edema    takes Furosemide daily as needed and Potassium daily   Personal history of radiation therapy 2015   Radiation 03/08/14-04/26/14   50.4 gray to left breast. Lumpectomy cavity boosted to 64.4 gray   Restless legs syndrome    takes depakote   Sleep apnea  uses CPAP   Sleep apnea, obstructive    uses CPAP   Wears glasses     Tobacco History: Social History   Tobacco Use  Smoking Status Never  Smokeless Tobacco Never   Counseling given: Not Answered   Outpatient Medications Prior to Visit  Medication Sig Dispense Refill   albuterol (VENTOLIN HFA) 108 (90 Base) MCG/ACT inhaler TAKE 2 PUFFS BY MOUTH EVERY 6 HOURS AS NEEDED FOR WHEEZE OR SHORTNESS OF BREATH 36 each 1   amLODipine (NORVASC) 5 MG tablet TAKE 1 TABLET (5 MG TOTAL) BY MOUTH DAILY. 90 tablet 1   ASPIRIN LOW DOSE 81 MG EC tablet TAKE 1 TABLET BY MOUTH EVERYDAY AT BEDTIME 30 tablet 8   atorvastatin (LIPITOR) 20 MG tablet TAKE 1 TABLET BY MOUTH EVERY DAY 90 tablet 1   buPROPion (WELLBUTRIN  XL) 300 MG 24 hr tablet TAKE 1 TABLET BY MOUTH EVERY DAY 90 tablet 1   carvedilol (COREG) 12.5 MG tablet TAKE 1 TABLET BY MOUTH TWICE A DAY WITH MEALS 180 tablet 1   Cholecalciferol (VITAMIN D3) 125 MCG (5000 UT) TABS TAKE 1 TABLET BY MOUTH EVERY DAY 30 tablet 8   clindamycin (CLINDAGEL) 1 % gel Apply topically 2 (two) times daily. (Patient taking differently: Apply 1 application. topically 2 (two) times daily as needed (rash).) 30 g 0   furosemide (LASIX) 40 MG tablet TAKE 1/2 TABLET BY MOUTH DAILY AS NEEDED FOR EDEMA 45 tablet 3   HYDROcodone-acetaminophen (NORCO/VICODIN) 5-325 MG tablet Take 1 tablet by mouth every 6 (six) hours as needed for moderate pain. 60 tablet 0   HYDROcodone-acetaminophen (NORCO/VICODIN) 5-325 MG tablet Take 1 tablet by mouth every 6 (six) hours as needed for moderate pain. 60 tablet 0   HYDROcodone-acetaminophen (NORCO/VICODIN) 5-325 MG tablet Take 1 tablet by mouth every 6 (six) hours as needed for moderate pain. 60 tablet 0   LINZESS 290 MCG CAPS capsule TAKE 1 CAPSULE BY MOUTH EVERY DAY 90 capsule 2   losartan (COZAAR) 100 MG tablet TAKE 1 TABLET BY MOUTH EVERY DAY 90 tablet 1   meclizine (ANTIVERT) 25 MG tablet TAKE 1 TABLET BY MOUTH DAILY AS NEEDED FOR DIZZINESS 30 tablet 2   Multiple Vitamins-Minerals (CERTAVITE SENIOR/ANTIOXIDANT) TABS TAKE 1 TABLET BY MOUTH EVERY DAY 90 tablet 1   pantoprazole (PROTONIX) 40 MG tablet TAKE 1 TABLET BY MOUTH EVERY DAY 90 tablet 1   traZODone (DESYREL) 50 MG tablet TAKE 1 TABLET BY MOUTH AT BEDTIME AS NEEDED FOR SLEEP. 90 tablet 1   traZODone (DESYREL) 50 MG tablet Take 1 tablet (50 mg total) by mouth at bedtime as needed for sleep. 90 tablet 1   venlafaxine XR (EFFEXOR-XR) 150 MG 24 hr capsule TAKE 1 CAPSULE BY MOUTH EVERY DAY WITH BREAKFAST 30 capsule 2   No facility-administered medications prior to visit.     Review of Systems:   Constitutional:   No  weight loss, night sweats,  Fevers, chills, fatigue, or   lassitude.  HEENT:   No headaches,  Difficulty swallowing,  Tooth/dental problems, or  Sore throat,                No sneezing, itching, ear ache, nasal congestion, post nasal drip,   CV:  No chest pain,  Orthopnea, PND, swelling in lower extremities, anasarca, dizziness, palpitations, syncope.   GI  No heartburn, indigestion, abdominal pain, nausea, vomiting, diarrhea, change in bowel habits, loss of appetite, bloody stools.   Resp: No shortness of breath with exertion  or at rest.  No excess mucus, no productive cough,  No non-productive cough,  No coughing up of blood.  No change in color of mucus.  No wheezing.  No chest wall deformity  Skin: no rash or lesions.  GU: no dysuria, change in color of urine, no urgency or frequency.  No flank pain, no hematuria   MS:  No joint pain or swelling.  No decreased range of motion.  No back pain.    Physical Exam  BP 126/88 (BP Location: Left Arm, Cuff Size: Normal)   Pulse 69   Temp 98.1 F (36.7 C) (Oral)   Ht '5\' 5"'$  (1.651 m)   Wt 246 lb 9.6 oz (111.9 kg)   SpO2 96%   BMI 41.04 kg/m   GEN: A/Ox3; pleasant , NAD, well nourished , rolling walker.    HEENT:  [place]/AT,  NOSE-clear, THROAT-clear, no lesions, no postnasal drip or exudate noted.  Class II-III MP airway  NECK:  Supple w/ fair ROM; no JVD; normal carotid impulses w/o bruits; no thyromegaly or nodules palpated; no lymphadenopathy.    RESP  Clear  P & A; w/o, wheezes/ rales/ or rhonchi. no accessory muscle use, no dullness to percussion  CARD:  RRR, no m/r/g, 1+  peripheral edema, pulses intact, no cyanosis or clubbing.  GI:   Soft & nt; nml bowel sounds; no organomegaly or masses detected.   Musco: Warm bil, no deformities or joint swelling noted.   Neuro: alert, no focal deficits noted.    Skin: Warm, no lesions or rashes, right lateral leg with hematoma and bruising. Nml pulses.    Lab Results:  CBC     BNP No results found for: BNP  ProBNP No results  found for: PROBNP  Imaging: No results found.      No results found for: NITRICOXIDE      Assessment & Plan:   Sleep apnea Excellent control on nocturnal CPAP continue current settings  Plan  Patient Instructions  Keep up good work  Continue on CPAP At bedtime   Work on healthy weight  Do not drive if sleepy  Trazodone '50mg'$  At bedtime  For sleep , use with caution  Follow up in 1 year with Dr. Elsworth Soho  And As needed        Morbid obesity ([place]) Healthy weight loss discussed  Insomnia Long discussion regarding use of Trazodone. Use with caution increases risk of falls and balance issues .  Healthy sleep regimen discussed .   Plan  Patient Instructions  Keep up good work  Continue on CPAP At bedtime   Work on healthy weight  Do not drive if sleepy  Trazodone '50mg'$  At bedtime  For sleep , use with caution  Follow up in 1 year with Dr. Elsworth Soho  And As needed         Leg injury Right lower leg bruising.  Patient hit her right lower leg on the side of coffee table 4 days ago.  She has a bruised area with hematoma.  Skin is intact.  The patient is advised to keep leg elevated.  Warm compresses as needed. Advised to follow-up with primary care  Patient is on sedating medications have advised her to use with caution as she is prone to frequent falls with her balance issues, advised to use with caution.       Rexene Edison, NP 04/24/2022

## 2022-04-24 NOTE — Assessment & Plan Note (Signed)
Long discussion regarding use of Trazodone. Use with caution increases risk of falls and balance issues .  Healthy sleep regimen discussed .   Plan  Patient Instructions  Keep up good work  Continue on CPAP At bedtime   Work on healthy weight  Do not drive if sleepy  Trazodone '50mg'$  At bedtime  For sleep , use with caution  Follow up in 1 year with Dr. Elsworth Soho  And As needed

## 2022-04-24 NOTE — Patient Instructions (Addendum)
Keep up good work  Continue on CPAP At bedtime   Work on healthy weight  Do not drive if sleepy  Trazodone '50mg'$  At bedtime  For sleep as needed  , use with caution   Elevate leg as able .  Warm compresses to bruising .  Follow up with Primary MD for check up .   Follow up in 1 year with Dr. Elsworth Soho  And As needed

## 2022-04-29 ENCOUNTER — Ambulatory Visit: Payer: Medicare Other | Admitting: Internal Medicine

## 2022-05-05 ENCOUNTER — Encounter: Payer: Self-pay | Admitting: Internal Medicine

## 2022-05-05 ENCOUNTER — Ambulatory Visit (INDEPENDENT_AMBULATORY_CARE_PROVIDER_SITE_OTHER): Payer: Medicare HMO | Admitting: Internal Medicine

## 2022-05-05 VITALS — BP 126/88 | HR 76 | Temp 98.2°F | Wt 241.3 lb

## 2022-05-05 DIAGNOSIS — E559 Vitamin D deficiency, unspecified: Secondary | ICD-10-CM

## 2022-05-05 DIAGNOSIS — R296 Repeated falls: Secondary | ICD-10-CM

## 2022-05-05 DIAGNOSIS — M159 Polyosteoarthritis, unspecified: Secondary | ICD-10-CM

## 2022-05-05 DIAGNOSIS — I10 Essential (primary) hypertension: Secondary | ICD-10-CM

## 2022-05-05 DIAGNOSIS — G894 Chronic pain syndrome: Secondary | ICD-10-CM

## 2022-05-05 DIAGNOSIS — Z113 Encounter for screening for infections with a predominantly sexual mode of transmission: Secondary | ICD-10-CM | POA: Diagnosis not present

## 2022-05-05 LAB — COMPREHENSIVE METABOLIC PANEL
ALT: 14 U/L (ref 0–35)
AST: 14 U/L (ref 0–37)
Albumin: 3.8 g/dL (ref 3.5–5.2)
Alkaline Phosphatase: 76 U/L (ref 39–117)
BUN: 17 mg/dL (ref 6–23)
CO2: 29 mEq/L (ref 19–32)
Calcium: 9.6 mg/dL (ref 8.4–10.5)
Chloride: 106 mEq/L (ref 96–112)
Creatinine, Ser: 0.92 mg/dL (ref 0.40–1.20)
GFR: 62.38 mL/min (ref >60.00)
Glucose, Bld: 97 mg/dL (ref 70–99)
Potassium: 4.2 mEq/L (ref 3.5–5.1)
Sodium: 142 mEq/L (ref 135–145)
Total Bilirubin: 0.6 mg/dL (ref 0.2–1.2)
Total Protein: 7 g/dL (ref 6.0–8.3)

## 2022-05-05 LAB — CBC WITH DIFFERENTIAL/PLATELET
Basophils Absolute: 0 10*3/uL (ref 0.0–0.1)
Basophils Relative: 0.6 % (ref 0.0–3.0)
Eosinophils Absolute: 0.2 10*3/uL (ref 0.0–0.7)
Eosinophils Relative: 2.7 % (ref 0.0–5.0)
HCT: 37.3 % (ref 36.0–46.0)
Hemoglobin: 12.4 g/dL (ref 12.0–15.0)
Lymphocytes Relative: 18.1 % (ref 12.0–46.0)
Lymphs Abs: 1 10*3/uL (ref 0.7–4.0)
MCHC: 33.2 g/dL (ref 30.0–36.0)
MCV: 87.6 fl (ref 78.0–100.0)
Monocytes Absolute: 0.5 10*3/uL (ref 0.1–1.0)
Monocytes Relative: 9 % (ref 3.0–12.0)
Neutro Abs: 4 10*3/uL (ref 1.4–7.7)
Neutrophils Relative %: 69.6 % (ref 43.0–77.0)
Platelets: 262 10*3/uL (ref 150.0–400.0)
RBC: 4.26 Mil/uL (ref 3.87–5.11)
RDW: 13.5 % (ref 11.5–15.5)
WBC: 5.7 10*3/uL (ref 4.0–10.5)

## 2022-05-05 LAB — TSH: TSH: 1.73 u[IU]/mL (ref 0.35–5.50)

## 2022-05-05 LAB — VITAMIN B12: Vitamin B-12: 451 pg/mL (ref 211–911)

## 2022-05-05 LAB — VITAMIN D 25 HYDROXY (VIT D DEFICIENCY, FRACTURES): VITD: 95.8 ng/mL (ref 30.00–100.00)

## 2022-05-05 MED ORDER — HYDROCODONE-ACETAMINOPHEN 5-325 MG PO TABS: 1.0000 | ORAL_TABLET | Freq: Four times a day (QID) | ORAL | 0 refills | Status: DC | PRN

## 2022-05-05 MED ORDER — HYDROCODONE-ACETAMINOPHEN 5-325 MG PO TABS: 1.0000 | ORAL_TABLET | Freq: Four times a day (QID) | ORAL | 0 refills | Status: AC | PRN

## 2022-05-05 NOTE — Progress Notes (Signed)
Established Patient Office Visit     CC/Reason for Visit: Follow-up chronic conditions  HPI: Tamara [name] is a 71 y.o. female who is coming in today for the above mentioned reasons. Past Medical History is significant for:  Morbid obesity, hypertension, GERD, hyperlipidemia, prior breast cancer, obstructive sleep apnea and vitamin D deficiency.  She tells me she has been having some falls.  More than usual.  She does not feel dizzy or lightheaded when these happen, she does not lose consciousness.  She is going to be seeing her orthopedist soon, there is concern that she may need a left knee replacement.  This is the second time that her blood pressure has been noted to be elevated in office.  At home she has been averaging 120/75, at her recent pulmonology visit her blood pressure was noted to be 126/88.  She otherwise feels well.  She is also due for medication refills of her hydrocodone that she takes for her chronic pain syndrome.   Past Medical/Surgical History: Past Medical History:  Diagnosis Date   Allergy    seasonal allergies (Fall)   Anxiety    on meds   Arthritis    knees    Asthma    rare;only when around alot of dust-Ventolin inhaler as needed   Breast cancer ([place])     left. States she did not have lymph nodes removed   Bursitis of right shoulder    Cataract    not had sx as of yet   Cellulitis and abscess of leg    Complication of anesthesia    was told 01/06/14 that airway was small   Depression    takes Effexor and Wellbutrin daily   GERD (gastroesophageal reflux disease)    on meds   Hearing loss    History of bronchitis 1966   Hyperlipidemia    on meds   Hypertension    takes Coreg and Losartan daily   Joint pain    Joint swelling    Neuromuscular disorder (HCC)    RLS   Obese    Peripheral edema    takes Furosemide daily as needed and Potassium daily   Personal history of radiation therapy 2015   Radiation 03/08/14-04/26/14   50.4 gray to  left breast. Lumpectomy cavity boosted to 64.4 gray   Restless legs syndrome    takes depakote   Sleep apnea    uses CPAP   Sleep apnea, obstructive    uses CPAP   Wears glasses     Past Surgical History:  Procedure Laterality Date   BREAST LUMPECTOMY Left 2015   BREAST LUMPECTOMY WITH NEEDLE LOCALIZATION Left 01/24/2014   Procedure: BREAST LUMPECTOMY WITH NEEDLE LOCALIZATION;  Surgeon: Edward Jolly, MD;  Location: [facility];  Service: General;  Laterality: Left;   COLONOSCOPY N/A 03/20/2017   Procedure: COLONOSCOPY;  Surgeon: Mauri Pole, MD;  [facility];  Service: Endoscopy;  Laterality: N/A;   COLONOSCOPY  2018   COLONOSCOPY WITH PROPOFOL N/A 08/21/2020   Procedure: COLONOSCOPY WITH PROPOFOL;  Surgeon: Mauri Pole, MD;  [facility];  Service: Endoscopy;  Laterality: N/A;   DILATATION & CURETTAGE/HYSTEROSCOPY WITH TRUECLEAR N/A 01/06/2014   Procedure: DILATATION & CURETTAGE/HYSTEROSCOPY WITH TRUCLEAR;  Surgeon: Shon Millet II, MD;  Location: [facility];  Service: Gynecology;  Laterality: N/A;   HYSTEROPLASTY  01/2014   INNER EAR SURGERY Bilateral    for hearing loss   JOINT REPLACEMENT Right  Knee   LAPAROSCOPIC GASTRIC SLEEVE RESECTION N/A 01/28/2016   Procedure: LAPAROSCOPIC GASTRIC SLEEVE RESECTION WITH UPPER ENDO;  Surgeon: Excell Seltzer, MD;  [facility]: [facility];  Service: General;  Laterality: N/A;   LIPOSUCTION WITH LIPOFILLING Bilateral 06/20/2019   Procedure: BILATERAL THIGH LIPECTOMY;  Surgeon: Irene Limbo, MD;  Location: [facility];  Service: Plastics;  Laterality: Bilateral;   PANNICULECTOMY N/A 06/18/2018   Procedure: PANNICULECTOMY;  Surgeon: Irene Limbo, MD;  Location: [facility];  Service: Plastics;  Laterality: N/A;   POLYPECTOMY  08/21/2020   Procedure: POLYPECTOMY;  Surgeon: Mauri Pole, MD;  [facility];  Service: Endoscopy;;   RE-EXCISION OF BREAST LUMPECTOMY Left 02/02/2014    Procedure: RE-EXCISION OF LEFT BREAST LUMPECTOMY;  Surgeon: Edward Jolly, MD;  Location: [facility];  Service: General;  Laterality: Left;   [place] EXTRACTION  1970    Social History:  reports that she has never smoked. She has never used smokeless tobacco. She reports that she does not drink alcohol and does not use drugs.  Allergies: No Known Allergies  Family History:  Family History  Problem Relation Age of Onset   Dementia Mother 40   Heart attack Mother 3   Esophageal cancer Father 6   Heart attack Father 9   Stomach cancer Father 47   Colon polyps Brother    Asthma Other    Hypertension Other    Thyroid disease Other    Heart attack Other    Throat cancer Paternal Grandfather    Colon cancer Neg Hx    Rectal cancer Neg Hx      Current Outpatient Medications:    albuterol (VENTOLIN HFA) 108 (90 Base) MCG/ACT inhaler, TAKE 2 PUFFS BY MOUTH EVERY 6 HOURS AS NEEDED FOR WHEEZE OR SHORTNESS OF BREATH, Disp: 36 each, Rfl: 1   amLODipine (NORVASC) 5 MG tablet, TAKE 1 TABLET (5 MG TOTAL) BY MOUTH DAILY., Disp: 90 tablet, Rfl: 1   ASPIRIN LOW DOSE 81 MG EC tablet, TAKE 1 TABLET BY MOUTH EVERYDAY AT BEDTIME, Disp: 30 tablet, Rfl: 8   atorvastatin (LIPITOR) 20 MG tablet, TAKE 1 TABLET BY MOUTH EVERY DAY, Disp: 90 tablet, Rfl: 1   buPROPion (WELLBUTRIN XL) 300 MG 24 hr tablet, TAKE 1 TABLET BY MOUTH EVERY DAY, Disp: 90 tablet, Rfl: 1   carvedilol (COREG) 12.5 MG tablet, TAKE 1 TABLET BY MOUTH TWICE A DAY WITH MEALS, Disp: 180 tablet, Rfl: 1   Cholecalciferol (VITAMIN D3) 125 MCG (5000 UT) TABS, TAKE 1 TABLET BY MOUTH EVERY DAY, Disp: 30 tablet, Rfl: 8   clindamycin (CLINDAGEL) 1 % gel, Apply topically 2 (two) times daily. (Patient taking differently: Apply 1 application. topically 2 (two) times daily as needed (rash).), Disp: 30 g, Rfl: 0   furosemide (LASIX) 40 MG tablet, TAKE 1/2 TABLET BY MOUTH DAILY AS NEEDED FOR EDEMA, Disp: 45 tablet, Rfl: 3    LINZESS 290 MCG CAPS capsule, TAKE 1 CAPSULE BY MOUTH EVERY DAY, Disp: 90 capsule, Rfl: 2   losartan (COZAAR) 100 MG tablet, TAKE 1 TABLET BY MOUTH EVERY DAY, Disp: 90 tablet, Rfl: 1   meclizine (ANTIVERT) 25 MG tablet, TAKE 1 TABLET BY MOUTH DAILY AS NEEDED FOR DIZZINESS, Disp: 30 tablet, Rfl: 2   Multiple Vitamins-Minerals (CERTAVITE SENIOR/ANTIOXIDANT) TABS, TAKE 1 TABLET BY MOUTH EVERY DAY, Disp: 90 tablet, Rfl: 1   pantoprazole (PROTONIX) 40 MG tablet, TAKE 1 TABLET BY MOUTH EVERY DAY, Disp: 90 tablet,  Rfl: 1   traZODone (DESYREL) 50 MG tablet, TAKE 1 TABLET BY MOUTH AT BEDTIME AS NEEDED FOR SLEEP., Disp: 90 tablet, Rfl: 1   traZODone (DESYREL) 50 MG tablet, Take 1 tablet (50 mg total) by mouth at bedtime as needed for sleep., Disp: 90 tablet, Rfl: 1   venlafaxine XR (EFFEXOR-XR) 150 MG 24 hr capsule, TAKE 1 CAPSULE BY MOUTH EVERY DAY WITH BREAKFAST, Disp: 30 capsule, Rfl: 2   HYDROcodone-acetaminophen (NORCO/VICODIN) 5-325 MG tablet, Take 1 tablet by mouth every 6 (six) hours as needed for moderate pain., Disp: 60 tablet, Rfl: 0   HYDROcodone-acetaminophen (NORCO/VICODIN) 5-325 MG tablet, Take 1 tablet by mouth every 6 (six) hours as needed for moderate pain., Disp: 60 tablet, Rfl: 0   HYDROcodone-acetaminophen (NORCO/VICODIN) 5-325 MG tablet, Take 1 tablet by mouth every 6 (six) hours as needed for moderate pain., Disp: 60 tablet, Rfl: 0  Review of Systems:  Constitutional: Denies fever, chills, diaphoresis, appetite change and fatigue.  HEENT: Denies photophobia, eye pain, redness, hearing loss, ear pain, congestion, sore throat, rhinorrhea, sneezing, mouth sores, trouble swallowing, neck pain, neck stiffness and tinnitus.   Respiratory: Denies SOB, DOE, cough, chest tightness,  and wheezing.   Cardiovascular: Denies chest pain, palpitations and leg swelling.  Gastrointestinal: Denies nausea, vomiting, abdominal pain, diarrhea, constipation, blood in stool and abdominal distention.   Genitourinary: Denies dysuria, urgency, frequency, hematuria, flank pain and difficulty urinating.  Endocrine: Denies: hot or cold intolerance, sweats, changes in hair or nails, polyuria, polydipsia. Musculoskeletal: Denies myalgias, back pain, joint swelling, arthralgias and gait problem.  Skin: Denies pallor, rash and wound.  Neurological: Denies dizziness, seizures, syncope, weakness, light-headedness, numbness and headaches.  Hematological: Denies adenopathy. Easy bruising, personal or family bleeding history  Psychiatric/Behavioral: Denies suicidal ideation, mood changes, confusion, nervousness, sleep disturbance and agitation    Physical Exam: Vitals:   05/05/22 1055 05/05/22 1059 05/05/22 1117  BP: (!) 160/100 (!) 162/98 126/88  Pulse: 76    Temp: 98.2 F (36.8 C)    TempSrc: Oral    SpO2: 98%    Weight: 241 lb 4.8 oz (109.5 kg)      Body mass index is 40.15 kg/m.   Constitutional: NAD, calm, comfortable, obese, ambulates with walker Eyes: PERRL, lids and conjunctivae normal, wears corrective lenses ENMT: Mucous membranes are moist.  Respiratory: clear to auscultation bilaterally, no wheezing, no crackles. Normal respiratory effort. No accessory muscle use.  Cardiovascular: Regular rate and rhythm, no murmurs / rubs / gallops. No extremity edema.  Psychiatric: Normal judgment and insight. Alert and oriented x 3. Normal mood.    Impression and Plan:  Chronic pain disorder  Generalized osteoarthritis of multiple sites  -PDMP reviewed, no red flags, overdose risk score is 120. -Because of her frequent falls we have discussed weaning hydrocodone.  I will prescribe her usual dose which is 5/325 mg limited to 2 tablets a day for total of 60 tablets a month x3 months, however she agrees to take it only at bedtime for now and see how she does.  If there is an improvement in her falls we can wean her off completely at next visit.  Morbid obesity ([place]) -Discussed healthy  lifestyle, including increased physical activity and better food choices to promote weight loss.  Frequent falls  - Plan: CBC with Differential/Platelet, Comprehensive metabolic panel, TSH, Vitamin B12, RPR -Check above labs, wean hydrocodone, resume physical therapy.  Vitamin D deficiency  - Plan: VITAMIN D 25 Hydroxy (Vit-D Deficiency, Fractures)  Essential  hypertension -Blood pressure remains elevated at office but normal measurements while at home, she is compliant with current medications which include amlodipine 5 mg, losartan 100 mg and Coreg 12.5 mg twice daily.    Time spent:33 minutes reviewing chart, interviewing and examining patient and formulating plan of care.     Lelon Frohlich, MD [facility]

## 2022-05-06 LAB — RPR: RPR Ser Ql: NONREACTIVE

## 2022-05-07 DIAGNOSIS — M25511 Pain in right shoulder: Secondary | ICD-10-CM | POA: Diagnosis not present

## 2022-05-07 DIAGNOSIS — M160 Bilateral primary osteoarthritis of hip: Secondary | ICD-10-CM | POA: Diagnosis not present

## 2022-05-08 ENCOUNTER — Other Ambulatory Visit: Payer: Self-pay | Admitting: Adult Health

## 2022-05-08 ENCOUNTER — Other Ambulatory Visit: Payer: Self-pay | Admitting: Internal Medicine

## 2022-05-08 DIAGNOSIS — I10 Essential (primary) hypertension: Secondary | ICD-10-CM

## 2022-05-08 DIAGNOSIS — F419 Anxiety disorder, unspecified: Secondary | ICD-10-CM

## 2022-05-08 DIAGNOSIS — K219 Gastro-esophageal reflux disease without esophagitis: Secondary | ICD-10-CM

## 2022-05-08 NOTE — Telephone Encounter (Signed)
Tammy, please advise on med refill.

## 2022-05-12 ENCOUNTER — Other Ambulatory Visit: Payer: Self-pay | Admitting: *Deleted

## 2022-05-12 DIAGNOSIS — K219 Gastro-esophageal reflux disease without esophagitis: Secondary | ICD-10-CM

## 2022-05-12 DIAGNOSIS — F419 Anxiety disorder, unspecified: Secondary | ICD-10-CM

## 2022-05-12 MED ORDER — PANTOPRAZOLE SODIUM 40 MG PO TBEC: 40.0000 mg | DELAYED_RELEASE_TABLET | Freq: Every day | ORAL | 0 refills | Status: DC

## 2022-05-12 MED ORDER — MELOXICAM 15 MG PO TABS: 15.0000 mg | ORAL_TABLET | Freq: Every day | ORAL | 0 refills | Status: AC

## 2022-05-12 MED ORDER — TRAZODONE HCL 50 MG PO TABS: 50.0000 mg | ORAL_TABLET | Freq: Every evening | ORAL | 0 refills | Status: DC | PRN

## 2022-05-12 MED ORDER — BUPROPION HCL ER (XL) 300 MG PO TB24: 300.0000 mg | ORAL_TABLET | Freq: Every day | ORAL | 0 refills | Status: DC

## 2022-05-12 MED ORDER — VENLAFAXINE HCL ER 150 MG PO CP24: 150.0000 mg | ORAL_CAPSULE | Freq: Every day | ORAL | 0 refills | Status: AC

## 2022-05-12 NOTE — Addendum Note (Signed)
Addended by: Westley Hummer B on: 05/12/2022 12:49 PM   Modules accepted: Orders

## 2022-05-12 NOTE — Telephone Encounter (Signed)
Meloxicam not on current med list. Trazodone last filled by Parrett, Fonnie Mu, NP.  Okay to fill?

## 2022-05-13 ENCOUNTER — Other Ambulatory Visit: Payer: Self-pay | Admitting: *Deleted

## 2022-05-13 DIAGNOSIS — R600 Localized edema: Secondary | ICD-10-CM

## 2022-05-13 MED ORDER — ATORVASTATIN CALCIUM 20 MG PO TABS: 20.0000 mg | ORAL_TABLET | Freq: Every day | ORAL | 0 refills | Status: DC

## 2022-05-13 MED ORDER — MECLIZINE HCL 25 MG PO TABS: ORAL_TABLET | ORAL | 0 refills | Status: AC

## 2022-05-13 MED ORDER — FUROSEMIDE 40 MG PO TABS: ORAL_TABLET | ORAL | 0 refills | Status: DC

## 2022-05-27 DIAGNOSIS — L0390 Cellulitis, unspecified: Secondary | ICD-10-CM | POA: Diagnosis not present

## 2022-05-27 DIAGNOSIS — I10 Essential (primary) hypertension: Secondary | ICD-10-CM | POA: Diagnosis not present

## 2022-05-27 DIAGNOSIS — R262 Difficulty in walking, not elsewhere classified: Secondary | ICD-10-CM | POA: Diagnosis not present

## 2022-05-27 DIAGNOSIS — Z7689 Persons encountering health services in other specified circumstances: Secondary | ICD-10-CM | POA: Diagnosis not present

## 2022-05-27 DIAGNOSIS — G8929 Other chronic pain: Secondary | ICD-10-CM | POA: Diagnosis not present

## 2022-05-27 DIAGNOSIS — E782 Mixed hyperlipidemia: Secondary | ICD-10-CM | POA: Diagnosis not present

## 2022-05-27 DIAGNOSIS — W19XXXA Unspecified fall, initial encounter: Secondary | ICD-10-CM | POA: Diagnosis not present

## 2022-05-30 ENCOUNTER — Other Ambulatory Visit: Payer: Self-pay | Admitting: Internal Medicine

## 2022-05-30 DIAGNOSIS — I10 Essential (primary) hypertension: Secondary | ICD-10-CM

## 2022-06-02 ENCOUNTER — Encounter: Payer: Self-pay | Admitting: Internal Medicine

## 2022-06-02 DIAGNOSIS — I10 Essential (primary) hypertension: Secondary | ICD-10-CM | POA: Diagnosis not present

## 2022-06-02 DIAGNOSIS — R7303 Prediabetes: Secondary | ICD-10-CM | POA: Diagnosis not present

## 2022-06-02 DIAGNOSIS — L0390 Cellulitis, unspecified: Secondary | ICD-10-CM | POA: Diagnosis not present

## 2022-06-02 DIAGNOSIS — E782 Mixed hyperlipidemia: Secondary | ICD-10-CM | POA: Diagnosis not present

## 2022-06-04 NOTE — Telephone Encounter (Signed)
FYI

## 2022-06-09 DIAGNOSIS — M19011 Primary osteoarthritis, right shoulder: Secondary | ICD-10-CM | POA: Diagnosis not present

## 2022-06-09 DIAGNOSIS — G4700 Insomnia, unspecified: Secondary | ICD-10-CM | POA: Diagnosis not present

## 2022-06-09 DIAGNOSIS — L0390 Cellulitis, unspecified: Secondary | ICD-10-CM | POA: Diagnosis not present

## 2022-06-09 DIAGNOSIS — M25511 Pain in right shoulder: Secondary | ICD-10-CM | POA: Diagnosis not present

## 2022-06-09 DIAGNOSIS — E782 Mixed hyperlipidemia: Secondary | ICD-10-CM | POA: Diagnosis not present

## 2022-06-09 DIAGNOSIS — M160 Bilateral primary osteoarthritis of hip: Secondary | ICD-10-CM | POA: Diagnosis not present

## 2022-06-09 DIAGNOSIS — I10 Essential (primary) hypertension: Secondary | ICD-10-CM | POA: Diagnosis not present

## 2022-06-09 DIAGNOSIS — G2581 Restless legs syndrome: Secondary | ICD-10-CM | POA: Diagnosis not present

## 2022-06-17 ENCOUNTER — Ambulatory Visit: Payer: Medicare HMO | Admitting: Orthopedic Surgery

## 2022-06-17 DIAGNOSIS — S81801A Unspecified open wound, right lower leg, initial encounter: Secondary | ICD-10-CM | POA: Diagnosis not present

## 2022-06-23 ENCOUNTER — Ambulatory Visit: Payer: Medicare HMO | Admitting: Orthopedic Surgery

## 2022-06-23 ENCOUNTER — Encounter: Payer: Self-pay | Admitting: Orthopedic Surgery

## 2022-06-23 DIAGNOSIS — S81801A Unspecified open wound, right lower leg, initial encounter: Secondary | ICD-10-CM | POA: Diagnosis not present

## 2022-06-23 NOTE — Progress Notes (Signed)
Office Visit Note   Patient: Tamara [name]           Date of Birth: 10/03/1951           MRN: [id number] Visit Date: 06/23/2022              Requested by: No referring provider defined for this encounter. PCP: Pcp, No  Chief Complaint  Patient presents with   Right Leg - Wound Check, Follow-up      HPI: Patient is a 71 year old woman who presents in follow-up for venous insufficiency ulceration lateral right calf.  Assessment & Plan: Visit Diagnoses:  1. Wound of right lower extremity, initial encounter     Plan: Patient is showing slow steady improvement.  We will continue with a Dynaflex wrap.  Patient does not feel like she could apply a compression sock.  Follow-Up Instructions: Return in about 1 week (around 06/30/2022).   Ortho Exam  Patient is alert, oriented, no adenopathy, well-dressed, normal affect, normal respiratory effort. Examination there is good wrinkling of the skin there is no cellulitis there is dermatitis.  The wound has approximately 75% healthy granulation tissue the wound bed is 3 x 7 cm.  Past medical history positive for sleep apnea uses a CPAP machine.  Imaging: No results found. No images are attached to the encounter.  Labs: Lab Results  Component Value Date   HGBA1C 5.8 04/13/2020   HGBA1C 5.4 06/29/2017   ESRSEDRATE 30 (H) 03/21/2010   CRP 13.2 (H) 03/21/2010   REPTSTATUS 03/25/2010 FINAL 03/21/2010   REPTSTATUS 03/21/2010 FINAL 03/21/2010   GRAMSTAIN  03/21/2010    RARE WBC PRESENT, PREDOMINANTLY PMN NO ORGANISMS SEEN Gram Stain Report Called to,Read Back By and Verified With: Gram Stain Report Called to,Read Back By and Verified With: DR [facility] 2130 03/21/10 BY K PUGH Performed at [facility]  03/21/2010    RARE WBC PRESENT, PREDOMINANTLY MONONUCLEAR NO ORGANISMS SEEN CALLED TO DR, LANDAU, 2130,03/21/10,K.PUGH   CULT NO GROWTH 3 DAYS 03/21/2010   LABORGA STAPHYLOCOCCUS AUREUS 03/21/2010   LABORGA  STAPHYLOCOCCUS AUREUS 03/21/2010     Lab Results  Component Value Date   ALBUMIN 3.8 05/05/2022   ALBUMIN 4.5 08/22/2021   ALBUMIN 3.9 04/13/2020    No results found for: "MG" Lab Results  Component Value Date   VD25OH 95.80 05/05/2022   VD25OH 50.37 04/13/2020   VD25OH 46.01 02/17/2017    No results found for: "PREALBUMIN"    Latest Ref Rng & Units 05/05/2022   11:46 AM 08/22/2021    8:22 PM 04/13/2020    9:29 AM  CBC EXTENDED  WBC 4.0 - 10.5 K/uL 5.7  5.3  4.7   RBC 3.87 - 5.11 Mil/uL 4.26  5.14  4.72   Hemoglobin 12.0 - 15.0 g/dL 12.4  14.6  12.3   HCT 36.0 - 46.0 % 37.3  45.0  37.2   Platelets 150.0 - 400.0 K/uL 262.0  238  219.0   NEUT# 1.4 - 7.7 K/uL 4.0  2.7  2.7   Lymph# 0.7 - 4.0 K/uL 1.0  1.8  1.4      There is no height or weight on file to calculate BMI.  Orders:  No orders of the defined types were placed in this encounter.  No orders of the defined types were placed in this encounter.    Procedures: No procedures performed  Clinical Data: No additional findings.  ROS:  All other systems negative, except as noted  in the HPI. Review of Systems  Objective: Vital Signs: There were no vitals taken for this visit.  Specialty Comments:  No specialty comments available.  PMFS History: Patient Active Problem List   Diagnosis Date Noted   Morbid obesity ([place]) 04/24/2022   Leg injury 04/24/2022   History of colonic polyps    Polyp of rectum    Difficult intubation 05/29/2020   Chronic vulvitis 04/20/2020   Status post abdominoplasty 03/13/2020   History of bariatric surgery 03/13/2020   Mixed conductive and sensorineural hearing loss of both ears 10/24/2019   Sensorineural hearing loss (SNHL), bilateral 10/24/2019   Chronic pain disorder 09/10/2018   Compression fracture of thoracic vertebra ([place]) 07/13/2018   Panniculitis 06/18/2018   Asthma, intermittent, uncomplicated 35/00/9381   GERD (gastroesophageal reflux disease) 03/02/2018    Ductal carcinoma in situ (DCIS) of left breast 03/01/2018   Degeneration of lumbar intervertebral disc 12/25/2017   Spinal stenosis of lumbar region 12/25/2017   Intertriginous candidiasis 10/02/2017   Polyp of transverse colon    Special screening for malignant neoplasms, colon    Vitamin D deficiency 02/16/2017   RLS (restless legs syndrome) 09/09/2016   Insomnia 07/11/2016   Anxiety and depression 07/11/2016   Generalized osteoarthritis of multiple sites 07/11/2016   Class 2 obesity with body mass index (BMI) of 36.0 to 36.9 in adult 01/28/2016   Shoulder pain 12/28/2014   Osteopenia 12/28/2014   Hot flashes 08/02/2014   Constipation 08/02/2014   Malignant neoplasm of upper-outer quadrant of left breast in female, estrogen receptor positive ([place]) 03/24/2014   CANDIDIASIS, SKIN 10/15/2009   PURE HYPERCHOLESTEROLEMIA 04/25/2009   Hyperlipidemia, acquired 02/16/2009   Sleep apnea 02/16/2009   Depression 06/18/2007   Essential hypertension 06/18/2007   Past Medical History:  Diagnosis Date   Allergy    seasonal allergies ([place])   Anxiety    on meds   Arthritis    knees    Asthma    rare;only when around alot of dust-Ventolin inhaler as needed   Breast cancer ([place])     left. States she did not have lymph nodes removed   Bursitis of right shoulder    Cataract    not had sx as of yet   Cellulitis and abscess of leg    Complication of anesthesia    was told 01/06/14 that airway was small   Depression    takes Effexor and Wellbutrin daily   GERD (gastroesophageal reflux disease)    on meds   Hearing loss    History of bronchitis 1966   Hyperlipidemia    on meds   Hypertension    takes Coreg and Losartan daily   Joint pain    Joint swelling    Neuromuscular disorder (HCC)    RLS   Obese    Peripheral edema    takes Furosemide daily as needed and Potassium daily   Personal history of radiation therapy 2015   Radiation 03/08/14-04/26/14   50.4 gray to left breast.  Lumpectomy cavity boosted to 64.4 gray   Restless legs syndrome    takes depakote   Sleep apnea    uses CPAP   Sleep apnea, obstructive    uses CPAP   Wears glasses     Family History  Problem Relation Age of Onset   Dementia Mother 7   Heart attack Mother 74   Esophageal cancer Father 80   Heart attack Father 84   Stomach cancer Father 76   Colon polyps  Brother    Asthma Other    Hypertension Other    Thyroid disease Other    Heart attack Other    Throat cancer Paternal Grandfather    Colon cancer Neg Hx    Rectal cancer Neg Hx     Past Surgical History:  Procedure Laterality Date   BREAST LUMPECTOMY Left 2015   BREAST LUMPECTOMY WITH NEEDLE LOCALIZATION Left 01/24/2014   Procedure: BREAST LUMPECTOMY WITH NEEDLE LOCALIZATION;  Surgeon: Edward Jolly, MD;  Location: [facility];  Service: General;  Laterality: Left;   COLONOSCOPY N/A 03/20/2017   Procedure: COLONOSCOPY;  Surgeon: Mauri Pole, MD;  [facility];  Service: Endoscopy;  Laterality: N/A;   COLONOSCOPY  2018   COLONOSCOPY WITH PROPOFOL N/A 08/21/2020   Procedure: COLONOSCOPY WITH PROPOFOL;  Surgeon: Mauri Pole, MD;  [facility];  Service: Endoscopy;  Laterality: N/A;   DILATATION & CURETTAGE/HYSTEROSCOPY WITH TRUECLEAR N/A 01/06/2014   Procedure: DILATATION & CURETTAGE/HYSTEROSCOPY WITH TRUCLEAR;  Surgeon: Shon Millet II, MD;  Location: [facility];  Service: Gynecology;  Laterality: N/A;   HYSTEROPLASTY  01/2014   INNER EAR SURGERY Bilateral    for hearing loss   JOINT REPLACEMENT Right    Knee   LAPAROSCOPIC GASTRIC SLEEVE RESECTION N/A 01/28/2016   Procedure: LAPAROSCOPIC GASTRIC SLEEVE RESECTION WITH UPPER ENDO;  Surgeon: Excell Seltzer, MD;  [facility];  Service: General;  Laterality: N/A;   LIPOSUCTION WITH LIPOFILLING Bilateral 06/20/2019   Procedure: BILATERAL THIGH LIPECTOMY;  Surgeon: Irene Limbo, MD;  Location: [facility];  Service: Plastics;  Laterality:  Bilateral;   PANNICULECTOMY N/A 06/18/2018   Procedure: PANNICULECTOMY;  Surgeon: Irene Limbo, MD;  Location: [facility];  Service: Plastics;  Laterality: N/A;   POLYPECTOMY  08/21/2020   Procedure: POLYPECTOMY;  Surgeon: Mauri Pole, MD;  [facility];  Service: Endoscopy;;   RE-EXCISION OF BREAST LUMPECTOMY Left 02/02/2014   Procedure: RE-EXCISION OF LEFT BREAST LUMPECTOMY;  Surgeon: Edward Jolly, MD;  Location: [facility];  Service: General;  Laterality: Left;   TUBAL LIGATION     WISDOM TOOTH EXTRACTION  1970   Social History   Occupational History   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

## 2022-06-24 ENCOUNTER — Ambulatory Visit: Payer: Medicare HMO | Admitting: Orthopedic Surgery

## 2022-06-30 ENCOUNTER — Encounter: Payer: Self-pay | Admitting: Orthopedic Surgery

## 2022-06-30 ENCOUNTER — Ambulatory Visit: Payer: Medicare HMO | Admitting: Orthopedic Surgery

## 2022-06-30 DIAGNOSIS — S81801A Unspecified open wound, right lower leg, initial encounter: Secondary | ICD-10-CM | POA: Diagnosis not present

## 2022-06-30 NOTE — Progress Notes (Signed)
Office Visit Note   Patient: Tamara [name]           Date of Birth: 04/22/1951           MRN: [id number] Visit Date: 06/30/2022              Requested by: No referring provider defined for this encounter. PCP: Pcp, No  Chief Complaint  Patient presents with   Right Leg - Wound Check, Follow-up      HPI: Patient is a 71 year old woman who presents in follow-up for right lower extremity venous insufficiency.  She has been in a compression wrap.  Assessment & Plan: Visit Diagnoses:  1. Wound of right lower extremity, initial encounter     Plan: We will continue with the compression wrap with silver alginate over the wound.  Continue with elevation.  Follow-Up Instructions: Return in about 1 week (around 07/07/2022).   Ortho Exam  Patient is alert, oriented, no adenopathy, well-dressed, normal affect, normal respiratory effort. Examination patient's wound measures 4 x 7 cm lateral right leg there is healthy granulation tissue the wound base the skin wrinkles well there is no cellulitis.  Imaging: No results found. No images are attached to the encounter.  Labs: Lab Results  Component Value Date   HGBA1C 5.8 04/13/2020   HGBA1C 5.4 06/29/2017   ESRSEDRATE 30 (H) 03/21/2010   CRP 13.2 (H) 03/21/2010   REPTSTATUS 03/25/2010 FINAL 03/21/2010   REPTSTATUS 03/21/2010 FINAL 03/21/2010   GRAMSTAIN  03/21/2010    RARE WBC PRESENT, PREDOMINANTLY PMN NO ORGANISMS SEEN Gram Stain Report Called to,Read Back By and Verified With: Gram Stain Report Called to,Read Back By and Verified With: DR [facility] 2130 03/21/10 BY K PUGH Performed at [place]  03/21/2010    RARE WBC PRESENT, PREDOMINANTLY MONONUCLEAR NO ORGANISMS SEEN CALLED TO DR, LANDAU, 2130,03/21/10,K.PUGH   CULT NO GROWTH 3 DAYS 03/21/2010   LABORGA STAPHYLOCOCCUS AUREUS 03/21/2010   LABORGA STAPHYLOCOCCUS AUREUS 03/21/2010     Lab Results  Component Value Date   ALBUMIN 3.8 05/05/2022    ALBUMIN 4.5 08/22/2021   ALBUMIN 3.9 04/13/2020    No results found for: "MG" Lab Results  Component Value Date   VD25OH 95.80 05/05/2022   VD25OH 50.37 04/13/2020   VD25OH 46.01 02/17/2017    No results found for: "PREALBUMIN"    Latest Ref Rng & Units 05/05/2022   11:46 AM 08/22/2021    8:22 PM 04/13/2020    9:29 AM  CBC EXTENDED  WBC 4.0 - 10.5 K/uL 5.7  5.3  4.7   RBC 3.87 - 5.11 Mil/uL 4.26  5.14  4.72   Hemoglobin 12.0 - 15.0 g/dL 12.4  14.6  12.3   HCT 36.0 - 46.0 % 37.3  45.0  37.2   Platelets 150.0 - 400.0 K/uL 262.0  238  219.0   NEUT# 1.4 - 7.7 K/uL 4.0  2.7  2.7   Lymph# 0.7 - 4.0 K/uL 1.0  1.8  1.4      There is no height or weight on file to calculate BMI.  Orders:  No orders of the defined types were placed in this encounter.  No orders of the defined types were placed in this encounter.    Procedures: No procedures performed  Clinical Data: No additional findings.  ROS:  All other systems negative, except as noted in the HPI. Review of Systems  Objective: Vital Signs: There were no vitals taken for this visit.  Specialty Comments:  No specialty comments available.  PMFS History: Patient Active Problem List   Diagnosis Date Noted   Morbid obesity ([place]) 04/24/2022   Leg injury 04/24/2022   History of colonic polyps    Polyp of rectum    Difficult intubation 05/29/2020   Chronic vulvitis 04/20/2020   Status post abdominoplasty 03/13/2020   History of bariatric surgery 03/13/2020   Mixed conductive and sensorineural hearing loss of both ears 10/24/2019   Sensorineural hearing loss (SNHL), bilateral 10/24/2019   Chronic pain disorder 09/10/2018   Compression fracture of thoracic vertebra ([place]) 07/13/2018   Panniculitis 06/18/2018   Asthma, intermittent, uncomplicated 86/76/1951   GERD (gastroesophageal reflux disease) 03/02/2018   Ductal carcinoma in situ (DCIS) of left breast 03/01/2018   Degeneration of lumbar intervertebral disc  12/25/2017   Spinal stenosis of lumbar region 12/25/2017   Intertriginous candidiasis 10/02/2017   Polyp of transverse colon    Special screening for malignant neoplasms, colon    Vitamin D deficiency 02/16/2017   RLS (restless legs syndrome) 09/09/2016   Insomnia 07/11/2016   Anxiety and depression 07/11/2016   Generalized osteoarthritis of multiple sites 07/11/2016   Class 2 obesity with body mass index (BMI) of 36.0 to 36.9 in adult 01/28/2016   Shoulder pain 12/28/2014   Osteopenia 12/28/2014   Hot flashes 08/02/2014   Constipation 08/02/2014   Malignant neoplasm of upper-outer quadrant of left breast in female, estrogen receptor positive ([place]) 03/24/2014   CANDIDIASIS, SKIN 10/15/2009   PURE HYPERCHOLESTEROLEMIA 04/25/2009   Hyperlipidemia, acquired 02/16/2009   Sleep apnea 02/16/2009   Depression 06/18/2007   Essential hypertension 06/18/2007   Past Medical History:  Diagnosis Date   Allergy    seasonal allergies ([place])   Anxiety    on meds   Arthritis    knees    Asthma    rare;only when around alot of dust-Ventolin inhaler as needed   Breast cancer ([place])     left. States she did not have lymph nodes removed   Bursitis of right shoulder    Cataract    not had sx as of yet   Cellulitis and abscess of leg    Complication of anesthesia    was told 01/06/14 that airway was small   Depression    takes Effexor and Wellbutrin daily   GERD (gastroesophageal reflux disease)    on meds   Hearing loss    History of bronchitis 1966   Hyperlipidemia    on meds   Hypertension    takes Coreg and Losartan daily   Joint pain    Joint swelling    Neuromuscular disorder (HCC)    RLS   Obese    Peripheral edema    takes Furosemide daily as needed and Potassium daily   Personal history of radiation therapy 2015   Radiation 03/08/14-04/26/14   50.4 gray to left breast. Lumpectomy cavity boosted to 64.4 gray   Restless legs syndrome    takes depakote   Sleep apnea    uses  CPAP   Sleep apnea, obstructive    uses CPAP   Wears glasses     Family History  Problem Relation Age of Onset   Dementia Mother 21   Heart attack Mother 31   Esophageal cancer Father 65   Heart attack Father 79   Stomach cancer Father 21   Colon polyps Brother    Asthma Other    Hypertension Other    Thyroid disease Other  Heart attack Other    Throat cancer Paternal Grandfather    Colon cancer Neg Hx    Rectal cancer Neg Hx     Past Surgical History:  Procedure Laterality Date   BREAST LUMPECTOMY Left 2015   BREAST LUMPECTOMY WITH NEEDLE LOCALIZATION Left 01/24/2014   Procedure: BREAST LUMPECTOMY WITH NEEDLE LOCALIZATION;  Surgeon: Edward Jolly, MD;  Location: [place];  Service: General;  Laterality: Left;   COLONOSCOPY N/A 03/20/2017   Procedure: COLONOSCOPY;  Surgeon: Mauri Pole, MD;  [facility];  Service: Endoscopy;  Laterality: N/A;   COLONOSCOPY  2018   COLONOSCOPY WITH PROPOFOL N/A 08/21/2020   Procedure: COLONOSCOPY WITH PROPOFOL;  Surgeon: Mauri Pole, MD;  [facility];  Service: Endoscopy;  Laterality: N/A;   DILATATION & CURETTAGE/HYSTEROSCOPY WITH TRUECLEAR N/A 01/06/2014   Procedure: DILATATION & CURETTAGE/HYSTEROSCOPY WITH TRUCLEAR;  Surgeon: Shon Millet II, MD;  Location: [facility];  Service: Gynecology;  Laterality: N/A;   HYSTEROPLASTY  01/2014   INNER EAR SURGERY Bilateral    for hearing loss   JOINT REPLACEMENT Right    Knee   LAPAROSCOPIC GASTRIC SLEEVE RESECTION N/A 01/28/2016   Procedure: LAPAROSCOPIC GASTRIC SLEEVE RESECTION WITH UPPER ENDO;  Surgeon: Excell Seltzer, MD;  Location[facility];  Service: General;  Laterality: N/A;   LIPOSUCTION WITH LIPOFILLING Bilateral 06/20/2019   Procedure: BILATERAL THIGH LIPECTOMY;  Surgeon: Irene Limbo, MD;  Location: [place];  Service: Plastics;  Laterality: Bilateral;   PANNICULECTOMY N/A 06/18/2018   Procedure: PANNICULECTOMY;  Surgeon: Irene Limbo, MD;   Location: [place];  Service: Plastics;  Laterality: N/A;   POLYPECTOMY  08/21/2020   Procedure: POLYPECTOMY;  Surgeon: Mauri Pole, MD;  [facility];  Service: Endoscopy;;   RE-EXCISION OF BREAST LUMPECTOMY Left 02/02/2014   Procedure: RE-EXCISION OF LEFT BREAST LUMPECTOMY;  Surgeon: Edward Jolly, MD;  Location: [place];  Service: General;  Laterality: Left;   TUBAL LIGATION     WISDOM TOOTH EXTRACTION  1970   Social History   Occupational History   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

## 2022-07-01 ENCOUNTER — Encounter: Payer: Self-pay | Admitting: Orthopedic Surgery

## 2022-07-01 NOTE — Progress Notes (Signed)
Office Visit Note   Patient: Tamara [name]           Date of Birth: 1951/05/15           MRN: [id number] Visit Date: 06/17/2022              Requested by: No referring provider defined for this encounter. PCP: Pcp, No  Chief Complaint  Patient presents with   Right Leg - Wound Check      HPI: Patient is a 71 year old woman who is seen for initial evaluation for traumatic wound right leg referred by Dr. Mardelle Matte.  Patient states that she fell 2 weeks ago striking her leg on a coffee table she states that she has had difficulty with balance she has not taken any antibiotics no history of diabetes.  Assessment & Plan: Visit Diagnoses:  1. Wound of right lower extremity, initial encounter     Plan: We will apply a 3 layer compression wrap for the right lower extremity reevaluate in 1 week.  May need to consider ankle-brachial indices if there is any problem with healing.  Follow-Up Instructions: Return in about 1 week (around 06/24/2022).   Ortho Exam  Patient is alert, oriented, no adenopathy, well-dressed, normal affect, normal respiratory effort. Examination patient has venous stasis changes with brawny skin color changes during ulcer over the lateral right leg secondary to blunt trauma that measures 3 x 7 cm.  A Doppler was used and she has a dampened biphasic dorsalis pedis anterior tibial and posterior tibial pulse.  Imaging: No results found. No images are attached to the encounter.  Labs: Lab Results  Component Value Date   HGBA1C 5.8 04/13/2020   HGBA1C 5.4 06/29/2017   ESRSEDRATE 30 (H) 03/21/2010   CRP 13.2 (H) 03/21/2010   REPTSTATUS 03/25/2010 FINAL 03/21/2010   REPTSTATUS 03/21/2010 FINAL 03/21/2010   GRAMSTAIN  03/21/2010    RARE WBC PRESENT, PREDOMINANTLY PMN NO ORGANISMS SEEN Gram Stain Report Called to,Read Back By and Verified With: Gram Stain Report Called to,Read Back By and Verified With: DR [facility] 2130 03/21/10 BY K PUGH Performed at [place]  03/21/2010    RARE WBC PRESENT, PREDOMINANTLY MONONUCLEAR NO ORGANISMS SEEN CALLED TO DR, LANDAU, 2130,03/21/10,K.PUGH   CULT NO GROWTH 3 DAYS 03/21/2010   LABORGA STAPHYLOCOCCUS AUREUS 03/21/2010   LABORGA STAPHYLOCOCCUS AUREUS 03/21/2010     Lab Results  Component Value Date   ALBUMIN 3.8 05/05/2022   ALBUMIN 4.5 08/22/2021   ALBUMIN 3.9 04/13/2020    No results found for: "MG" Lab Results  Component Value Date   VD25OH 95.80 05/05/2022   VD25OH 50.37 04/13/2020   VD25OH 46.01 02/17/2017    No results found for: "PREALBUMIN"    Latest Ref Rng & Units 05/05/2022   11:46 AM 08/22/2021    8:22 PM 04/13/2020    9:29 AM  CBC EXTENDED  WBC 4.0 - 10.5 K/uL 5.7  5.3  4.7   RBC 3.87 - 5.11 Mil/uL 4.26  5.14  4.72   Hemoglobin 12.0 - 15.0 g/dL 12.4  14.6  12.3   HCT 36.0 - 46.0 % 37.3  45.0  37.2   Platelets 150.0 - 400.0 K/uL 262.0  238  219.0   NEUT# 1.4 - 7.7 K/uL 4.0  2.7  2.7   Lymph# 0.7 - 4.0 K/uL 1.0  1.8  1.4      There is no height or weight on file to calculate BMI.  Orders:  Orders Placed This Encounter  Procedures   Ambulatory referral to Vascular Surgery   No orders of the defined types were placed in this encounter.    Procedures: No procedures performed  Clinical Data: No additional findings.  ROS:  All other systems negative, except as noted in the HPI. Review of Systems  Objective: Vital Signs: There were no vitals taken for this visit.  Specialty Comments:  No specialty comments available.  PMFS History: Patient Active Problem List   Diagnosis Date Noted   Morbid obesity ([place]) 04/24/2022   Leg injury 04/24/2022   History of colonic polyps    Polyp of rectum    Difficult intubation 05/29/2020   Chronic vulvitis 04/20/2020   Status post abdominoplasty 03/13/2020   History of bariatric surgery 03/13/2020   Mixed conductive and sensorineural hearing loss of both ears 10/24/2019   Sensorineural hearing loss  (SNHL), bilateral 10/24/2019   Chronic pain disorder 09/10/2018   Compression fracture of thoracic vertebra ([place]) 07/13/2018   Panniculitis 06/18/2018   Asthma, intermittent, uncomplicated 97/35/3299   GERD (gastroesophageal reflux disease) 03/02/2018   Ductal carcinoma in situ (DCIS) of left breast 03/01/2018   Degeneration of lumbar intervertebral disc 12/25/2017   Spinal stenosis of lumbar region 12/25/2017   Intertriginous candidiasis 10/02/2017   Polyp of transverse colon    Special screening for malignant neoplasms, colon    Vitamin D deficiency 02/16/2017   RLS (restless legs syndrome) 09/09/2016   Insomnia 07/11/2016   Anxiety and depression 07/11/2016   Generalized osteoarthritis of multiple sites 07/11/2016   Class 2 obesity with body mass index (BMI) of 36.0 to 36.9 in adult 01/28/2016   Shoulder pain 12/28/2014   Osteopenia 12/28/2014   Hot flashes 08/02/2014   Constipation 08/02/2014   Malignant neoplasm of upper-outer quadrant of left breast in female, estrogen receptor positive ([place]) 03/24/2014   CANDIDIASIS, SKIN 10/15/2009   PURE HYPERCHOLESTEROLEMIA 04/25/2009   Hyperlipidemia, acquired 02/16/2009   Sleep apnea 02/16/2009   Depression 06/18/2007   Essential hypertension 06/18/2007   Past Medical History:  Diagnosis Date   Allergy    seasonal allergies ([place])   Anxiety    on meds   Arthritis    knees    Asthma    rare;only when around alot of dust-Ventolin inhaler as needed   Breast cancer ([place])     left. States she did not have lymph nodes removed   Bursitis of right shoulder    Cataract    not had sx as of yet   Cellulitis and abscess of leg    Complication of anesthesia    was told 01/06/14 that airway was small   Depression    takes Effexor and Wellbutrin daily   GERD (gastroesophageal reflux disease)    on meds   Hearing loss    History of bronchitis 1966   Hyperlipidemia    on meds   Hypertension    takes Coreg and Losartan daily   Joint  pain    Joint swelling    Neuromuscular disorder ([place])    RLS   Obese    Peripheral edema    takes Furosemide daily as needed and Potassium daily   Personal history of radiation therapy 2015   Radiation 03/08/14-04/26/14   50.4 gray to left breast. Lumpectomy cavity boosted to 64.4 gray   Restless legs syndrome    takes depakote   Sleep apnea    uses CPAP   Sleep apnea, obstructive    uses CPAP  Wears glasses     Family History  Problem Relation Age of Onset   Dementia Mother 11   Heart attack Mother 23   Esophageal cancer Father 13   Heart attack Father 63   Stomach cancer Father 42   Colon polyps Brother    Asthma Other    Hypertension Other    Thyroid disease Other    Heart attack Other    Throat cancer Paternal Grandfather    Colon cancer Neg Hx    Rectal cancer Neg Hx     Past Surgical History:  Procedure Laterality Date   BREAST LUMPECTOMY Left 2015   BREAST LUMPECTOMY WITH NEEDLE LOCALIZATION Left 01/24/2014   Procedure: BREAST LUMPECTOMY WITH NEEDLE LOCALIZATION;  Surgeon: Edward Jolly, MD;  Location: [place];  Service: General;  Laterality: Left;   COLONOSCOPY N/A 03/20/2017   Procedure: COLONOSCOPY;  Surgeon: Mauri Pole, MD;  [facility];  Service: Endoscopy;  Laterality: N/A;   COLONOSCOPY  2018   COLONOSCOPY WITH PROPOFOL N/A 08/21/2020   Procedure: COLONOSCOPY WITH PROPOFOL;  Surgeon: Mauri Pole, MD;  [facility];  Service: Endoscopy;  Laterality: N/A;   DILATATION & CURETTAGE/HYSTEROSCOPY WITH TRUECLEAR N/A 01/06/2014   Procedure: DILATATION & CURETTAGE/HYSTEROSCOPY WITH TRUCLEAR;  Surgeon: Shon Millet II, MD;  Location: [facility];  Service: Gynecology;  Laterality: N/A;   HYSTEROPLASTY  01/2014   INNER EAR SURGERY Bilateral    for hearing loss   JOINT REPLACEMENT Right    Knee   LAPAROSCOPIC GASTRIC SLEEVE RESECTION N/A 01/28/2016   Procedure: LAPAROSCOPIC GASTRIC SLEEVE RESECTION WITH UPPER ENDO;  Surgeon:  Excell Seltzer, MD;  [facility];  Service: General;  Laterality: N/A;   LIPOSUCTION WITH LIPOFILLING Bilateral 06/20/2019   Procedure: BILATERAL THIGH LIPECTOMY;  Surgeon: Irene Limbo, MD;  Location: [place];  Service: Plastics;  Laterality: Bilateral;   PANNICULECTOMY N/A 06/18/2018   Procedure: PANNICULECTOMY;  Surgeon: Irene Limbo, MD;  Location: [place];  Service: Plastics;  Laterality: N/A;   POLYPECTOMY  08/21/2020   Procedure: POLYPECTOMY;  Surgeon: Mauri Pole, MD;  [facility];  Service: Endoscopy;;   RE-EXCISION OF BREAST LUMPECTOMY Left 02/02/2014   Procedure: RE-EXCISION OF LEFT BREAST LUMPECTOMY;  Surgeon: Edward Jolly, MD;  Location: [place];  Service: General;  Laterality: Left;   TUBAL LIGATION     WISDOM TOOTH EXTRACTION  1970   Social History   Occupational History   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

## 2022-07-07 ENCOUNTER — Encounter: Payer: Self-pay | Admitting: Orthopedic Surgery

## 2022-07-07 ENCOUNTER — Ambulatory Visit: Payer: Medicare HMO | Admitting: Orthopedic Surgery

## 2022-07-07 DIAGNOSIS — S81801A Unspecified open wound, right lower leg, initial encounter: Secondary | ICD-10-CM | POA: Diagnosis not present

## 2022-07-07 NOTE — Progress Notes (Signed)
Office Visit Note   Patient: Tamara [name]           Date of Birth: [birth year]           MRN: [id number] Visit Date: 07/07/2022              Requested by: No referring provider defined for this encounter. PCP: Pcp, No  Chief Complaint  Patient presents with   Right Leg - Wound Check      HPI: Patient is a 71 year old woman who is seen in follow-up for venous insufficiency ulcer right lower extremity.  Initially patient's pulses were not palpable.  There has been significant decrease swelling.  Assessment & Plan: Visit Diagnoses:  1. Wound of right lower extremity, initial encounter     Plan: We will continue with compression wraps.  Silver alginate and Dynaflex wrap applied today.  Follow-Up Instructions: Return in about 1 week (around 07/14/2022).   Ortho Exam  Patient is alert, oriented, no adenopathy, well-dressed, normal affect, normal respiratory effort. Examination patient has good wrinkling of the skin and significant decrease swelling.  The wound is smaller currently measures 80 x 25 mm with flat healthy granulation tissue.  Patient has a palpable dorsalis pedis and posterior tibial pulse.  No arterial insufficiency.  Imaging: No results found. No images are attached to the encounter.  Labs: Lab Results  Component Value Date   HGBA1C 5.8 04/13/2020   HGBA1C 5.4 06/29/2017   ESRSEDRATE 30 (H) 03/21/2010   CRP 13.2 (H) 03/21/2010   REPTSTATUS 03/25/2010 FINAL 03/21/2010   REPTSTATUS 03/21/2010 FINAL 03/21/2010   GRAMSTAIN  03/21/2010    RARE WBC PRESENT, PREDOMINANTLY PMN NO ORGANISMS SEEN Gram Stain Report Called to,Read Back By and Verified With: Gram Stain Report Called to,Read Back By and Verified With: DR [facility] 2130 03/21/10 BY K PUGH Performed at [facility]  03/21/2010    RARE WBC PRESENT, PREDOMINANTLY MONONUCLEAR NO ORGANISMS SEEN CALLED TO DR, LANDAU, 2130,03/21/10,K.PUGH   CULT NO GROWTH 3 DAYS 03/21/2010   LABORGA  STAPHYLOCOCCUS AUREUS 03/21/2010   LABORGA STAPHYLOCOCCUS AUREUS 03/21/2010     Lab Results  Component Value Date   ALBUMIN 3.8 05/05/2022   ALBUMIN 4.5 08/22/2021   ALBUMIN 3.9 04/13/2020    No results found for: "MG" Lab Results  Component Value Date   VD25OH 95.80 05/05/2022   VD25OH 50.37 04/13/2020   VD25OH 46.01 02/17/2017    No results found for: "PREALBUMIN"    Latest Ref Rng & Units 05/05/2022   11:46 AM 08/22/2021    8:22 PM 04/13/2020    9:29 AM  CBC EXTENDED  WBC 4.0 - 10.5 K/uL 5.7  5.3  4.7   RBC 3.87 - 5.11 Mil/uL 4.26  5.14  4.72   Hemoglobin 12.0 - 15.0 g/dL 12.4  14.6  12.3   HCT 36.0 - 46.0 % 37.3  45.0  37.2   Platelets 150.0 - 400.0 K/uL 262.0  238  219.0   NEUT# 1.4 - 7.7 K/uL 4.0  2.7  2.7   Lymph# 0.7 - 4.0 K/uL 1.0  1.8  1.4      There is no height or weight on file to calculate BMI.  Orders:  No orders of the defined types were placed in this encounter.  No orders of the defined types were placed in this encounter.    Procedures: No procedures performed  Clinical Data: No additional findings.  ROS:  All other systems negative, except as  noted in the HPI. Review of Systems  Objective: Vital Signs: There were no vitals taken for this visit.  Specialty Comments:  No specialty comments available.  PMFS History: Patient Active Problem List   Diagnosis Date Noted   Morbid obesity ([place]) 04/24/2022   Leg injury 04/24/2022   History of colonic polyps    Polyp of rectum    Difficult intubation 05/29/2020   Chronic vulvitis 04/20/2020   Status post abdominoplasty 03/13/2020   History of bariatric surgery 03/13/2020   Mixed conductive and sensorineural hearing loss of both ears 10/24/2019   Sensorineural hearing loss (SNHL), bilateral 10/24/2019   Chronic pain disorder 09/10/2018   Compression fracture of thoracic vertebra ([place]) 07/13/2018   Panniculitis 06/18/2018   Asthma, intermittent, uncomplicated 71/69/6789   GERD  (gastroesophageal reflux disease) 03/02/2018   Ductal carcinoma in situ (DCIS) of left breast 03/01/2018   Degeneration of lumbar intervertebral disc 12/25/2017   Spinal stenosis of lumbar region 12/25/2017   Intertriginous candidiasis 10/02/2017   Polyp of transverse colon    Special screening for malignant neoplasms, colon    Vitamin D deficiency 02/16/2017   RLS (restless legs syndrome) 09/09/2016   Insomnia 07/11/2016   Anxiety and depression 07/11/2016   Generalized osteoarthritis of multiple sites 07/11/2016   Class 2 obesity with body mass index (BMI) of 36.0 to 36.9 in adult 01/28/2016   Shoulder pain 12/28/2014   Osteopenia 12/28/2014   Hot flashes 08/02/2014   Constipation 08/02/2014   Malignant neoplasm of upper-outer quadrant of left breast in female, estrogen receptor positive ([place]) 03/24/2014   CANDIDIASIS, SKIN 10/15/2009   PURE HYPERCHOLESTEROLEMIA 04/25/2009   Hyperlipidemia, acquired 02/16/2009   Sleep apnea 02/16/2009   Depression 06/18/2007   Essential hypertension 06/18/2007   Past Medical History:  Diagnosis Date   Allergy    seasonal allergies (Fall)   Anxiety    on meds   Arthritis    knees    Asthma    rare;only when around alot of dust-Ventolin inhaler as needed   Breast cancer ([place])     left. States she did not have lymph nodes removed   Bursitis of right shoulder    Cataract    not had sx as of yet   Cellulitis and abscess of leg    Complication of anesthesia    was told 01/06/14 that airway was small   Depression    takes Effexor and Wellbutrin daily   GERD (gastroesophageal reflux disease)    on meds   Hearing loss    History of bronchitis 1966   Hyperlipidemia    on meds   Hypertension    takes Coreg and Losartan daily   Joint pain    Joint swelling    Neuromuscular disorder (HCC)    RLS   Obese    Peripheral edema    takes Furosemide daily as needed and Potassium daily   Personal history of radiation therapy 2015   Radiation  03/08/14-04/26/14   50.4 gray to left breast. Lumpectomy cavity boosted to 64.4 gray   Restless legs syndrome    takes depakote   Sleep apnea    uses CPAP   Sleep apnea, obstructive    uses CPAP   Wears glasses     Family History  Problem Relation Age of Onset   Dementia Mother 74   Heart attack Mother 94   Esophageal cancer Father 17   Heart attack Father 57   Stomach cancer Father 48   Colon  polyps Brother    Asthma Other    Hypertension Other    Thyroid disease Other    Heart attack Other    Throat cancer Paternal Grandfather    Colon cancer Neg Hx    Rectal cancer Neg Hx     Past Surgical History:  Procedure Laterality Date   BREAST LUMPECTOMY Left 2015   BREAST LUMPECTOMY WITH NEEDLE LOCALIZATION Left 01/24/2014   Procedure: BREAST LUMPECTOMY WITH NEEDLE LOCALIZATION;  Surgeon: Edward Jolly, MD;  Location: [facility];  Service: General;  Laterality: Left;   COLONOSCOPY N/A 03/20/2017   Procedure: COLONOSCOPY;  Surgeon: Mauri Pole, MD;  [facility];  Service: Endoscopy;  Laterality: N/A;   COLONOSCOPY  2018   COLONOSCOPY WITH PROPOFOL N/A 08/21/2020   Procedure: COLONOSCOPY WITH PROPOFOL;  Surgeon: Mauri Pole, MD;  [facility];  Service: Endoscopy;  Laterality: N/A;   DILATATION & CURETTAGE/HYSTEROSCOPY WITH TRUECLEAR N/A 01/06/2014   Procedure: DILATATION & CURETTAGE/HYSTEROSCOPY WITH TRUCLEAR;  Surgeon: Shon Millet II, MD;  Location: [facility];  Service: Gynecology;  Laterality: N/A;   HYSTEROPLASTY  01/2014   INNER EAR SURGERY Bilateral    for hearing loss   JOINT REPLACEMENT Right    Knee   LAPAROSCOPIC GASTRIC SLEEVE RESECTION N/A 01/28/2016   Procedure: LAPAROSCOPIC GASTRIC SLEEVE RESECTION WITH UPPER ENDO;  Surgeon: Excell Seltzer, MD;  Location: [facility];  Service: General;  Laterality: N/A;   LIPOSUCTION WITH LIPOFILLING Bilateral 06/20/2019   Procedure: BILATERAL THIGH LIPECTOMY;  Surgeon: Irene Limbo, MD;  Location: [place];  Service: Plastics;  Laterality: Bilateral;   PANNICULECTOMY N/A 06/18/2018   Procedure: PANNICULECTOMY;  Surgeon: Irene Limbo, MD;  Location: [facility];  Service: Plastics;  Laterality: N/A;   POLYPECTOMY  08/21/2020   Procedure: POLYPECTOMY;  Surgeon: Mauri Pole, MD;  [facility];  Service: Endoscopy;;   RE-EXCISION OF BREAST LUMPECTOMY Left 02/02/2014   Procedure: RE-EXCISION OF LEFT BREAST LUMPECTOMY;  Surgeon: Edward Jolly, MD;  Location: [facility];  Service: General;  Laterality: Left;   TUBAL LIGATION     WISDOM TOOTH EXTRACTION  1970   Social History   Occupational History   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

## 2022-07-16 ENCOUNTER — Ambulatory Visit: Payer: Medicare HMO | Admitting: Family

## 2022-07-16 ENCOUNTER — Encounter: Payer: Self-pay | Admitting: Family

## 2022-07-16 DIAGNOSIS — I872 Venous insufficiency (chronic) (peripheral): Secondary | ICD-10-CM

## 2022-07-16 DIAGNOSIS — S81801D Unspecified open wound, right lower leg, subsequent encounter: Secondary | ICD-10-CM | POA: Diagnosis not present

## 2022-07-16 NOTE — Progress Notes (Signed)
Office Visit Note   Patient: Tamara [name]           Date of Birth: December 03, 1950           MRN: [id number] Visit Date: 07/16/2022              Requested by: No referring provider defined for this encounter. PCP: Pcp, No  Chief Complaint  Patient presents with  . Right Leg - Wound Check      HPI: The patient is a 71 year old woman who presents in follow-up for venous insufficiency ulceration to her right lower extremity.  She has been having serial compression wrapping with Dynaflex and silver cell over the wound.  Assessment & Plan: Visit Diagnoses:  1. Wound of right lower extremity, subsequent encounter   2. Venous insufficiency (chronic) (peripheral)     Plan: Today we will apply 4-layer compression with Unna and Dynaflex to as well as continue with the silver alginate over the wound she will follow-up in 1 week for reevaluation.  Follow-Up Instructions: No follow-ups on file.   Ortho Exam  Patient is alert, oriented, no adenopathy, well-dressed, normal affect, normal respiratory effort. Unfortunately the wrap has rolled down there is good wrinkling of the skin from compression where the wrap did cover her lower extremity.  The wound looks to have circumferential epithelialization.  Please see attached photo.  There is flat granulation tissue no active drainage there is no erythema no odor  Imaging: No results found.   Labs: Lab Results  Component Value Date   HGBA1C 5.8 04/13/2020   HGBA1C 5.4 06/29/2017   ESRSEDRATE 30 (H) 03/21/2010   CRP 13.2 (H) 03/21/2010   REPTSTATUS 03/25/2010 FINAL 03/21/2010   REPTSTATUS 03/21/2010 FINAL 03/21/2010   GRAMSTAIN  03/21/2010    RARE WBC PRESENT, PREDOMINANTLY PMN NO ORGANISMS SEEN Gram Stain Report Called to,Read Back By and Verified With: Gram Stain Report Called to,Read Back By and Verified With: DR [facility] 2130 03/21/10 BY K PUGH Performed at [place]  03/21/2010    RARE WBC PRESENT,  PREDOMINANTLY MONONUCLEAR NO ORGANISMS SEEN CALLED TO DR, LANDAU, 2130,03/21/10,K.PUGH   CULT NO GROWTH 3 DAYS 03/21/2010   LABORGA STAPHYLOCOCCUS AUREUS 03/21/2010   LABORGA STAPHYLOCOCCUS AUREUS 03/21/2010     Lab Results  Component Value Date   ALBUMIN 3.8 05/05/2022   ALBUMIN 4.5 08/22/2021   ALBUMIN 3.9 04/13/2020    No results found for: "MG" Lab Results  Component Value Date   VD25OH 95.80 05/05/2022   VD25OH 50.37 04/13/2020   VD25OH 46.01 02/17/2017    No results found for: "PREALBUMIN"    Latest Ref Rng & Units 05/05/2022   11:46 AM 08/22/2021    8:22 PM 04/13/2020    9:29 AM  CBC EXTENDED  WBC 4.0 - 10.5 K/uL 5.7  5.3  4.7   RBC 3.87 - 5.11 Mil/uL 4.26  5.14  4.72   Hemoglobin 12.0 - 15.0 g/dL 12.4  14.6  12.3   HCT 36.0 - 46.0 % 37.3  45.0  37.2   Platelets 150.0 - 400.0 K/uL 262.0  238  219.0   NEUT# 1.4 - 7.7 K/uL 4.0  2.7  2.7   Lymph# 0.7 - 4.0 K/uL 1.0  1.8  1.4      There is no height or weight on file to calculate BMI.  Orders:  No orders of the defined types were placed in this encounter.  No orders of the defined types were  placed in this encounter.    Procedures: No procedures performed  Clinical Data: No additional findings.  ROS:  All other systems negative, except as noted in the HPI. Review of Systems  Constitutional:  Negative for chills and fever.  Skin:  Positive for wound. Negative for color change.    Objective: Vital Signs: There were no vitals taken for this visit.  Specialty Comments:  No specialty comments available.  PMFS History: Patient Active Problem List   Diagnosis Date Noted  . Morbid obesity ([place]) 04/24/2022  . Leg injury 04/24/2022  . History of colonic polyps   . Polyp of rectum   . Difficult intubation 05/29/2020  . Chronic vulvitis 04/20/2020  . Status post abdominoplasty 03/13/2020  . History of bariatric surgery 03/13/2020  . Mixed conductive and sensorineural hearing loss of both ears  10/24/2019  . Sensorineural hearing loss (SNHL), bilateral 10/24/2019  . Chronic pain disorder 09/10/2018  . Compression fracture of thoracic vertebra (HCC) 07/13/2018  . Panniculitis 06/18/2018  . Asthma, intermittent, uncomplicated 97/35/3299  . GERD (gastroesophageal reflux disease) 03/02/2018  . Ductal carcinoma in situ (DCIS) of left breast 03/01/2018  . Degeneration of lumbar intervertebral disc 12/25/2017  . Spinal stenosis of lumbar region 12/25/2017  . Intertriginous candidiasis 10/02/2017  . Polyp of transverse colon   . Special screening for malignant neoplasms, colon   . Vitamin D deficiency 02/16/2017  . RLS (restless legs syndrome) 09/09/2016  . Insomnia 07/11/2016  . Anxiety and depression 07/11/2016  . Generalized osteoarthritis of multiple sites 07/11/2016  . Class 2 obesity with body mass index (BMI) of 36.0 to 36.9 in adult 01/28/2016  . Shoulder pain 12/28/2014  . Osteopenia 12/28/2014  . Hot flashes 08/02/2014  . Constipation 08/02/2014  . Malignant neoplasm of upper-outer quadrant of left breast in female, estrogen receptor positive ([place]) 03/24/2014  . CANDIDIASIS, SKIN 10/15/2009  . PURE HYPERCHOLESTEROLEMIA 04/25/2009  . Hyperlipidemia, acquired 02/16/2009  . Sleep apnea 02/16/2009  . Depression 06/18/2007  . Essential hypertension 06/18/2007   Past Medical History:  Diagnosis Date  . Allergy    seasonal allergies ([place])  . Anxiety    on meds  . Arthritis    knees   . Asthma    rare;only when around alot of dust-Ventolin inhaler as needed  . Breast cancer ([place])     left. States she did not have lymph nodes removed  . Bursitis of right shoulder   . Cataract    not had sx as of yet  . Cellulitis and abscess of leg   . Complication of anesthesia    was told 01/06/14 that airway was small  . Depression    takes Effexor and Wellbutrin daily  . GERD (gastroesophageal reflux disease)    on meds  . Hearing loss   . History of bronchitis 1966  .  Hyperlipidemia    on meds  . Hypertension    takes Coreg and Losartan daily  . Joint pain   . Joint swelling   . Neuromuscular disorder (HCC)    RLS  . Obese   . Peripheral edema    takes Furosemide daily as needed and Potassium daily  . Personal history of radiation therapy 2015  . Radiation 03/08/14-04/26/14   50.4 gray to left breast. Lumpectomy cavity boosted to 64.4 gray  . Restless legs syndrome    takes depakote  . Sleep apnea    uses CPAP  . Sleep apnea, obstructive    uses CPAP  .  Wears glasses     Family History  Problem Relation Age of Onset  . Dementia Mother 44  . Heart attack Mother 34  . Esophageal cancer Father 48  . Heart attack Father 40  . Stomach cancer Father 15  . Colon polyps Brother   . Asthma Other   . Hypertension Other   . Thyroid disease Other   . Heart attack Other   . Throat cancer Paternal Grandfather   . Colon cancer Neg Hx   . Rectal cancer Neg Hx     Past Surgical History:  Procedure Laterality Date  . BREAST LUMPECTOMY Left 2015  . BREAST LUMPECTOMY WITH NEEDLE LOCALIZATION Left 01/24/2014   Procedure: BREAST LUMPECTOMY WITH NEEDLE LOCALIZATION;  Surgeon: Edward Jolly, MD;  Location: [facility];  Service: General;  Laterality: Left;  . COLONOSCOPY N/A 03/20/2017   Procedure: COLONOSCOPY;  Surgeon: Mauri Pole, MD;  [facility];  Service: Endoscopy;  Laterality: N/A;  . COLONOSCOPY  2018  . COLONOSCOPY WITH PROPOFOL N/A 08/21/2020   Procedure: COLONOSCOPY WITH PROPOFOL;  Surgeon: Mauri Pole, MD;  [facility];  Service: Endoscopy;  Laterality: N/A;  . DILATATION & CURETTAGE/HYSTEROSCOPY WITH TRUECLEAR N/A 01/06/2014   Procedure: DILATATION & CURETTAGE/HYSTEROSCOPY WITH TRUCLEAR;  Surgeon: Shon Millet II, MD;  Location: [facility];  Service: Gynecology;  Laterality: N/A;  . HYSTEROPLASTY  01/2014  . INNER EAR SURGERY Bilateral    for hearing loss  . JOINT REPLACEMENT Right    Knee  . LAPAROSCOPIC  GASTRIC SLEEVE RESECTION N/A 01/28/2016   Procedure: LAPAROSCOPIC GASTRIC SLEEVE RESECTION WITH UPPER ENDO;  Surgeon: Excell Seltzer, MD;  [facility];  Service: General;  Laterality: N/A;  . LIPOSUCTION WITH LIPOFILLING Bilateral 06/20/2019   Procedure: BILATERAL THIGH LIPECTOMY;  Surgeon: Irene Limbo, MD;  Location: [facility];  Service: Plastics;  Laterality: Bilateral;  . PANNICULECTOMY N/A 06/18/2018   Procedure: PANNICULECTOMY;  Surgeon: Irene Limbo, MD;  Location: [facility];  Service: Plastics;  Laterality: N/A;  . POLYPECTOMY  08/21/2020   Procedure: POLYPECTOMY;  Surgeon: Mauri Pole, MD;  [facility];  Service: Endoscopy;;  . RE-EXCISION OF BREAST LUMPECTOMY Left 02/02/2014   Procedure: RE-EXCISION OF LEFT BREAST LUMPECTOMY;  Surgeon: Edward Jolly, MD;  Location: [place] [facility];  Service: General;  Laterality: Left;  . TUBAL LIGATION    . [place] EXTRACTION  1970   Social History   Occupational History  . 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 Smoking status: Never  . Smokeless tobacco: Never  Vaping Use  . Vaping Use: Never used  Substance and Sexual Activity  . Alcohol use: No  . Drug use: No  . Sexual activity: Not Currently    Birth control/protection: Post-menopausal

## 2022-07-21 ENCOUNTER — Ambulatory Visit: Payer: Medicare HMO | Admitting: Orthopedic Surgery

## 2022-07-21 DIAGNOSIS — S81801D Unspecified open wound, right lower leg, subsequent encounter: Secondary | ICD-10-CM | POA: Diagnosis not present

## 2022-07-21 DIAGNOSIS — M79604 Pain in right leg: Secondary | ICD-10-CM | POA: Diagnosis not present

## 2022-07-22 ENCOUNTER — Encounter: Payer: Self-pay | Admitting: Orthopedic Surgery

## 2022-07-22 NOTE — Progress Notes (Signed)
Office Visit Note   Patient: Tamara [name]           Date of Birth: 15-Oct-1951           MRN: [id number] Visit Date: 07/21/2022              Requested by: No referring provider defined for this encounter. PCP: Pcp, No  Chief Complaint  Patient presents with   Right Leg - Wound Check    Venous stasis ulcer      HPI: Patient is a 71 year old woman with a ulcer lateral aspect right lower extremity she is currently using silver cell and a 3 layer compression wrap.  The wrap is rolled down her leg.  Patient states she also has some redness and swelling the left lower extremity that occasionally happens.  Patient occasionally uses diuretics.  Assessment & Plan: Visit Diagnoses:  1. Wound of right lower extremity, subsequent encounter     Plan: We will apply a Dynaflex wrap to the right lower extremity will request authorization for lymphedema pump.  Patient is failing conservative therapy with compression exercise elevation and diet.  Follow-Up Instructions: Return in about 1 week (around 07/28/2022).   Ortho Exam  Patient is alert, oriented, no adenopathy, well-dressed, normal affect, normal respiratory effort. Examination patient has a wound over the lateral aspect of the right leg which measures 3 x 7 cm with healthy granulation tissue no odor no drainage no cellulitis.  Imaging: No results found. No images are attached to the encounter.  Labs: Lab Results  Component Value Date   HGBA1C 5.8 04/13/2020   HGBA1C 5.4 06/29/2017   ESRSEDRATE 30 (H) 03/21/2010   CRP 13.2 (H) 03/21/2010   REPTSTATUS 03/25/2010 FINAL 03/21/2010   REPTSTATUS 03/21/2010 FINAL 03/21/2010   GRAMSTAIN  03/21/2010    RARE WBC PRESENT, PREDOMINANTLY PMN NO ORGANISMS SEEN Gram Stain Report Called to,Read Back By and Verified With: Gram Stain Report Called to,Read Back By and Verified With: DR [facility] 2130 03/21/10 BY K PUGH Performed at [place]  03/21/2010    RARE WBC  PRESENT, PREDOMINANTLY MONONUCLEAR NO ORGANISMS SEEN CALLED TO DR, LANDAU, 2130,03/21/10,K.PUGH   CULT NO GROWTH 3 DAYS 03/21/2010   LABORGA STAPHYLOCOCCUS AUREUS 03/21/2010   LABORGA STAPHYLOCOCCUS AUREUS 03/21/2010     Lab Results  Component Value Date   ALBUMIN 3.8 05/05/2022   ALBUMIN 4.5 08/22/2021   ALBUMIN 3.9 04/13/2020    No results found for: "MG" Lab Results  Component Value Date   VD25OH 95.80 05/05/2022   VD25OH 50.37 04/13/2020   VD25OH 46.01 02/17/2017    No results found for: "PREALBUMIN"    Latest Ref Rng & Units 05/05/2022   11:46 AM 08/22/2021    8:22 PM 04/13/2020    9:29 AM  CBC EXTENDED  WBC 4.0 - 10.5 K/uL 5.7  5.3  4.7   RBC 3.87 - 5.11 Mil/uL 4.26  5.14  4.72   Hemoglobin 12.0 - 15.0 g/dL 12.4  14.6  12.3   HCT 36.0 - 46.0 % 37.3  45.0  37.2   Platelets 150.0 - 400.0 K/uL 262.0  238  219.0   NEUT# 1.4 - 7.7 K/uL 4.0  2.7  2.7   Lymph# 0.7 - 4.0 K/uL 1.0  1.8  1.4      There is no height or weight on file to calculate BMI.  Orders:  No orders of the defined types were placed in this encounter.  No orders  of the defined types were placed in this encounter.    Procedures: No procedures performed  Clinical Data: No additional findings.  ROS:  All other systems negative, except as noted in the HPI. Review of Systems  Objective: Vital Signs: There were no vitals taken for this visit.  Specialty Comments:  No specialty comments available.  PMFS History: Patient Active Problem List   Diagnosis Date Noted   Morbid obesity ([place]) 04/24/2022   Leg injury 04/24/2022   History of colonic polyps    Polyp of rectum    Difficult intubation 05/29/2020   Chronic vulvitis 04/20/2020   Status post abdominoplasty 03/13/2020   History of bariatric surgery 03/13/2020   Mixed conductive and sensorineural hearing loss of both ears 10/24/2019   Sensorineural hearing loss (SNHL), bilateral 10/24/2019   Chronic pain disorder 09/10/2018    Compression fracture of thoracic vertebra ([place]) 07/13/2018   Panniculitis 06/18/2018   Asthma, intermittent, uncomplicated 39/76/7341   GERD (gastroesophageal reflux disease) 03/02/2018   Ductal carcinoma in situ (DCIS) of left breast 03/01/2018   Degeneration of lumbar intervertebral disc 12/25/2017   Spinal stenosis of lumbar region 12/25/2017   Intertriginous candidiasis 10/02/2017   Polyp of transverse colon    Special screening for malignant neoplasms, colon    Vitamin D deficiency 02/16/2017   RLS (restless legs syndrome) 09/09/2016   Insomnia 07/11/2016   Anxiety and depression 07/11/2016   Generalized osteoarthritis of multiple sites 07/11/2016   Class 2 obesity with body mass index (BMI) of 36.0 to 36.9 in adult 01/28/2016   Shoulder pain 12/28/2014   Osteopenia 12/28/2014   Hot flashes 08/02/2014   Constipation 08/02/2014   Malignant neoplasm of upper-outer quadrant of left breast in female, estrogen receptor positive ([place]) 03/24/2014   CANDIDIASIS, SKIN 10/15/2009   PURE HYPERCHOLESTEROLEMIA 04/25/2009   Hyperlipidemia, acquired 02/16/2009   Sleep apnea 02/16/2009   Depression 06/18/2007   Essential hypertension 06/18/2007   Past Medical History:  Diagnosis Date   Allergy    seasonal allergies (Fall)   Anxiety    on meds   Arthritis    knees    Asthma    rare;only when around alot of dust-Ventolin inhaler as needed   Breast cancer ([place])     left. States she did not have lymph nodes removed   Bursitis of right shoulder    Cataract    not had sx as of yet   Cellulitis and abscess of leg    Complication of anesthesia    was told 01/06/14 that airway was small   Depression    takes Effexor and Wellbutrin daily   GERD (gastroesophageal reflux disease)    on meds   Hearing loss    History of bronchitis 1966   Hyperlipidemia    on meds   Hypertension    takes Coreg and Losartan daily   Joint pain    Joint swelling    Neuromuscular disorder (HCC)    RLS    Obese    Peripheral edema    takes Furosemide daily as needed and Potassium daily   Personal history of radiation therapy 2015   Radiation 03/08/14-04/26/14   50.4 gray to left breast. Lumpectomy cavity boosted to 64.4 gray   Restless legs syndrome    takes depakote   Sleep apnea    uses CPAP   Sleep apnea, obstructive    uses CPAP   Wears glasses     Family History  Problem Relation Age of Onset  Dementia Mother 11   Heart attack Mother 79   Esophageal cancer Father 74   Heart attack Father 21   Stomach cancer Father 74   Colon polyps Brother    Asthma Other    Hypertension Other    Thyroid disease Other    Heart attack Other    Throat cancer Paternal Grandfather    Colon cancer Neg Hx    Rectal cancer Neg Hx     Past Surgical History:  Procedure Laterality Date   BREAST LUMPECTOMY Left 2015   BREAST LUMPECTOMY WITH NEEDLE LOCALIZATION Left 01/24/2014   Procedure: BREAST LUMPECTOMY WITH NEEDLE LOCALIZATION;  Surgeon: Edward Jolly, MD;  Location: [place];  Service: General;  Laterality: Left;   COLONOSCOPY N/A 03/20/2017   Procedure: COLONOSCOPY;  Surgeon: Mauri Pole, MD;  [facility];  Service: Endoscopy;  Laterality: N/A;   COLONOSCOPY  2018   COLONOSCOPY WITH PROPOFOL N/A 08/21/2020   Procedure: COLONOSCOPY WITH PROPOFOL;  Surgeon: Mauri Pole, MD;  [facility];  Service: Endoscopy;  Laterality: N/A;   DILATATION & CURETTAGE/HYSTEROSCOPY WITH TRUECLEAR N/A 01/06/2014   Procedure: DILATATION & CURETTAGE/HYSTEROSCOPY WITH TRUCLEAR;  Surgeon: Shon Millet II, MD;  Location: [facility];  Service: Gynecology;  Laterality: N/A;   HYSTEROPLASTY  01/2014   INNER EAR SURGERY Bilateral    for hearing loss   JOINT REPLACEMENT Right    Knee   LAPAROSCOPIC GASTRIC SLEEVE RESECTION N/A 01/28/2016   Procedure: LAPAROSCOPIC GASTRIC SLEEVE RESECTION WITH UPPER ENDO;  Surgeon: Excell Seltzer, MD;  Location: [facility];  Service: General;  Laterality:  N/A;   LIPOSUCTION WITH LIPOFILLING Bilateral 06/20/2019   Procedure: BILATERAL THIGH LIPECTOMY;  Surgeon: Irene Limbo, MD;  Location: [place];  Service: Plastics;  Laterality: Bilateral;   PANNICULECTOMY N/A 06/18/2018   Procedure: PANNICULECTOMY;  Surgeon: Irene Limbo, MD;  Location: [place] [facility];  Service: Plastics;  Laterality: N/A;   POLYPECTOMY  08/21/2020   Procedure: POLYPECTOMY;  Surgeon: Mauri Pole, MD;  [facility];  Service: Endoscopy;;   RE-EXCISION OF BREAST LUMPECTOMY Left 02/02/2014   Procedure: RE-EXCISION OF LEFT BREAST LUMPECTOMY;  Surgeon: Edward Jolly, MD;  Location: [place];  Service: General;  Laterality: Left;   TUBAL LIGATION     WISDOM TOOTH EXTRACTION  1970   Social History   Occupational History   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

## 2022-07-23 ENCOUNTER — Other Ambulatory Visit: Payer: Self-pay | Admitting: Internal Medicine

## 2022-07-28 ENCOUNTER — Encounter: Payer: Self-pay | Admitting: Orthopedic Surgery

## 2022-07-28 ENCOUNTER — Ambulatory Visit: Payer: Medicare HMO | Admitting: Orthopedic Surgery

## 2022-07-28 DIAGNOSIS — S81801D Unspecified open wound, right lower leg, subsequent encounter: Secondary | ICD-10-CM

## 2022-07-28 DIAGNOSIS — I890 Lymphedema, not elsewhere classified: Secondary | ICD-10-CM

## 2022-07-28 DIAGNOSIS — I872 Venous insufficiency (chronic) (peripheral): Secondary | ICD-10-CM

## 2022-07-28 NOTE — Progress Notes (Signed)
Office Visit Note   Patient: Tamara [name]           Date of Birth: February 03, 1951           MRN: [id number] Visit Date: 07/28/2022              Requested by: No referring provider defined for this encounter. PCP: Pcp, No  Chief Complaint  Patient presents with   Right Leg - Wound Check    Venous stasis ulcer      HPI: Patient is a 71 year old woman who presents in follow-up for venous and lymphatic insufficiency ulceration right lower extremity she is undergoing serial compression wraps.  Assessment & Plan: Visit Diagnoses:  1. Wound of right lower extremity, subsequent encounter   2. Lymphedema   3. Venous insufficiency (chronic) (peripheral)     Plan: Pression wrap with Dynaflex reapplied.  Patient will require lymphedema pumps.  Follow-Up Instructions: Return in about 1 week (around 08/04/2022).   Ortho Exam  Patient is alert, oriented, no adenopathy, well-dressed, normal affect, normal respiratory effort. Examination patient has undergone over 4 weeks of conservative therapy for wound healing for her venous and lymphatic insufficiency ulcer right lower extremity.  The ulcer currently measures 25 x 70 mm.  The calf is 49 cm in circumference ankle is 20 cm in circumference and the foot is 27 cm in circumference.  Imaging: No results found.   Labs: Lab Results  Component Value Date   HGBA1C 5.8 04/13/2020   HGBA1C 5.4 06/29/2017   ESRSEDRATE 30 (H) 03/21/2010   CRP 13.2 (H) 03/21/2010   REPTSTATUS 03/25/2010 FINAL 03/21/2010   REPTSTATUS 03/21/2010 FINAL 03/21/2010   GRAMSTAIN  03/21/2010    RARE WBC PRESENT, PREDOMINANTLY PMN NO ORGANISMS SEEN Gram Stain Report Called to,Read Back By and Verified With: Gram Stain Report Called to,Read Back By and Verified With: DR [facility] 2130 03/21/10 BY K PUGH Performed at [place]  03/21/2010    RARE WBC PRESENT, PREDOMINANTLY MONONUCLEAR NO ORGANISMS SEEN CALLED TO DR, LANDAU, 2130,03/21/10,K.PUGH    CULT NO GROWTH 3 DAYS 03/21/2010   LABORGA STAPHYLOCOCCUS AUREUS 03/21/2010   LABORGA STAPHYLOCOCCUS AUREUS 03/21/2010     Lab Results  Component Value Date   ALBUMIN 3.8 05/05/2022   ALBUMIN 4.5 08/22/2021   ALBUMIN 3.9 04/13/2020    No results found for: "MG" Lab Results  Component Value Date   VD25OH 95.80 05/05/2022   VD25OH 50.37 04/13/2020   VD25OH 46.01 02/17/2017    No results found for: "PREALBUMIN"    Latest Ref Rng & Units 05/05/2022   11:46 AM 08/22/2021    8:22 PM 04/13/2020    9:29 AM  CBC EXTENDED  WBC 4.0 - 10.5 K/uL 5.7  5.3  4.7   RBC 3.87 - 5.11 Mil/uL 4.26  5.14  4.72   Hemoglobin 12.0 - 15.0 g/dL 12.4  14.6  12.3   HCT 36.0 - 46.0 % 37.3  45.0  37.2   Platelets 150.0 - 400.0 K/uL 262.0  238  219.0   NEUT# 1.4 - 7.7 K/uL 4.0  2.7  2.7   Lymph# 0.7 - 4.0 K/uL 1.0  1.8  1.4      There is no height or weight on file to calculate BMI.  Orders:  No orders of the defined types were placed in this encounter.  No orders of the defined types were placed in this encounter.    Procedures: No procedures performed  Clinical Data:  No additional findings.  ROS:  All other systems negative, except as noted in the HPI. Review of Systems  Objective: Vital Signs: There were no vitals taken for this visit.  Specialty Comments:  No specialty comments available.  PMFS History: Patient Active Problem List   Diagnosis Date Noted   Morbid obesity ([place]) 04/24/2022   Leg injury 04/24/2022   History of colonic polyps    Polyp of rectum    Difficult intubation 05/29/2020   Chronic vulvitis 04/20/2020   Status post abdominoplasty 03/13/2020   History of bariatric surgery 03/13/2020   Mixed conductive and sensorineural hearing loss of both ears 10/24/2019   Sensorineural hearing loss (SNHL), bilateral 10/24/2019   Chronic pain disorder 09/10/2018   Compression fracture of thoracic vertebra ([place]) 07/13/2018   Panniculitis 06/18/2018   Asthma,  intermittent, uncomplicated 02/58/5277   GERD (gastroesophageal reflux disease) 03/02/2018   Ductal carcinoma in situ (DCIS) of left breast 03/01/2018   Degeneration of lumbar intervertebral disc 12/25/2017   Spinal stenosis of lumbar region 12/25/2017   Intertriginous candidiasis 10/02/2017   Polyp of transverse colon    Special screening for malignant neoplasms, colon    Vitamin D deficiency 02/16/2017   RLS (restless legs syndrome) 09/09/2016   Insomnia 07/11/2016   Anxiety and depression 07/11/2016   Generalized osteoarthritis of multiple sites 07/11/2016   Class 2 obesity with body mass index (BMI) of 36.0 to 36.9 in adult 01/28/2016   Shoulder pain 12/28/2014   Osteopenia 12/28/2014   Hot flashes 08/02/2014   Constipation 08/02/2014   Malignant neoplasm of upper-outer quadrant of left breast in female, estrogen receptor positive ([place]) 03/24/2014   CANDIDIASIS, SKIN 10/15/2009   PURE HYPERCHOLESTEROLEMIA 04/25/2009   Hyperlipidemia, acquired 02/16/2009   Sleep apnea 02/16/2009   Depression 06/18/2007   Essential hypertension 06/18/2007   Past Medical History:  Diagnosis Date   Allergy    seasonal allergies (Fall)   Anxiety    on meds   Arthritis    knees    Asthma    rare;only when around alot of dust-Ventolin inhaler as needed   Breast cancer ([place])     left. States she did not have lymph nodes removed   Bursitis of right shoulder    Cataract    not had sx as of yet   Cellulitis and abscess of leg    Complication of anesthesia    was told 01/06/14 that airway was small   Depression    takes Effexor and Wellbutrin daily   GERD (gastroesophageal reflux disease)    on meds   Hearing loss    History of bronchitis 1966   Hyperlipidemia    on meds   Hypertension    takes Coreg and Losartan daily   Joint pain    Joint swelling    Neuromuscular disorder (HCC)    RLS   Obese    Peripheral edema    takes Furosemide daily as needed and Potassium daily   Personal  history of radiation therapy 2015   Radiation 03/08/14-04/26/14   50.4 gray to left breast. Lumpectomy cavity boosted to 64.4 gray   Restless legs syndrome    takes depakote   Sleep apnea    uses CPAP   Sleep apnea, obstructive    uses CPAP   Wears glasses     Family History  Problem Relation Age of Onset   Dementia Mother 64   Heart attack Mother 16   Esophageal cancer Father 33   Heart  attack Father 56   Stomach cancer Father 82   Colon polyps Brother    Asthma Other    Hypertension Other    Thyroid disease Other    Heart attack Other    Throat cancer Paternal Grandfather    Colon cancer Neg Hx    Rectal cancer Neg Hx     Past Surgical History:  Procedure Laterality Date   BREAST LUMPECTOMY Left 2015   BREAST LUMPECTOMY WITH NEEDLE LOCALIZATION Left 01/24/2014   Procedure: BREAST LUMPECTOMY WITH NEEDLE LOCALIZATION;  Surgeon: Edward Jolly, MD;  Location: [place] [facility];  Service: General;  Laterality: Left;   COLONOSCOPY N/A 03/20/2017   Procedure: COLONOSCOPY;  Surgeon: Mauri Pole, MD;  [facility];  Service: Endoscopy;  Laterality: N/A;   COLONOSCOPY  2018   COLONOSCOPY WITH PROPOFOL N/A 08/21/2020   Procedure: COLONOSCOPY WITH PROPOFOL;  Surgeon: Mauri Pole, MD;  [facility];  Service: Endoscopy;  Laterality: N/A;   DILATATION & CURETTAGE/HYSTEROSCOPY WITH TRUECLEAR N/A 01/06/2014   Procedure: DILATATION & CURETTAGE/HYSTEROSCOPY WITH TRUCLEAR;  Surgeon: Shon Millet II, MD;  Location: [facility];  Service: Gynecology;  Laterality: N/A;   HYSTEROPLASTY  01/2014   INNER EAR SURGERY Bilateral    for hearing loss   JOINT REPLACEMENT Right    Knee   LAPAROSCOPIC GASTRIC SLEEVE RESECTION N/A 01/28/2016   Procedure: LAPAROSCOPIC GASTRIC SLEEVE RESECTION WITH UPPER ENDO;  Surgeon: Excell Seltzer, MD;  Location[facility];  Service: General;  Laterality: N/A;   LIPOSUCTION WITH LIPOFILLING Bilateral 06/20/2019   Procedure: BILATERAL THIGH LIPECTOMY;   Surgeon: Irene Limbo, MD;  Location: [facility];  Service: Plastics;  Laterality: Bilateral;   PANNICULECTOMY N/A 06/18/2018   Procedure: PANNICULECTOMY;  Surgeon: Irene Limbo, MD;  Location: [place];  Service: Plastics;  Laterality: N/A;   POLYPECTOMY  08/21/2020   Procedure: POLYPECTOMY;  Surgeon: Mauri Pole, MD;  [facility];  Service: Endoscopy;;   RE-EXCISION OF BREAST LUMPECTOMY Left 02/02/2014   Procedure: RE-EXCISION OF LEFT BREAST LUMPECTOMY;  Surgeon: Edward Jolly, MD;  Location: [place];  Service: General;  Laterality: Left;   TUBAL LIGATION     WISDOM TOOTH EXTRACTION  1970   Social History   Occupational History   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

## 2022-07-29 ENCOUNTER — Encounter: Payer: Medicare HMO | Admitting: Vascular Surgery

## 2022-07-29 ENCOUNTER — Encounter (HOSPITAL_COMMUNITY): Payer: Medicare HMO

## 2022-08-05 ENCOUNTER — Ambulatory Visit: Payer: Medicare HMO | Admitting: Family

## 2022-08-06 ENCOUNTER — Ambulatory Visit: Payer: Self-pay | Admitting: Internal Medicine

## 2022-08-06 DIAGNOSIS — I10 Essential (primary) hypertension: Secondary | ICD-10-CM | POA: Diagnosis not present

## 2022-08-06 DIAGNOSIS — L0390 Cellulitis, unspecified: Secondary | ICD-10-CM | POA: Diagnosis not present

## 2022-08-06 DIAGNOSIS — E782 Mixed hyperlipidemia: Secondary | ICD-10-CM | POA: Diagnosis not present

## 2022-08-08 ENCOUNTER — Other Ambulatory Visit: Payer: Self-pay | Admitting: Internal Medicine

## 2022-08-08 ENCOUNTER — Encounter: Payer: Self-pay | Admitting: Family

## 2022-08-08 ENCOUNTER — Ambulatory Visit: Payer: Medicare HMO | Admitting: Family

## 2022-08-08 DIAGNOSIS — S81801D Unspecified open wound, right lower leg, subsequent encounter: Secondary | ICD-10-CM | POA: Diagnosis not present

## 2022-08-08 DIAGNOSIS — F32A Depression, unspecified: Secondary | ICD-10-CM

## 2022-08-08 DIAGNOSIS — I10 Essential (primary) hypertension: Secondary | ICD-10-CM

## 2022-08-08 DIAGNOSIS — I872 Venous insufficiency (chronic) (peripheral): Secondary | ICD-10-CM

## 2022-08-08 DIAGNOSIS — K219 Gastro-esophageal reflux disease without esophagitis: Secondary | ICD-10-CM

## 2022-08-08 NOTE — Progress Notes (Signed)
Office Visit Note   Patient: Tamara [name]           Date of Birth: 12-Nov-1951           MRN: [id number] Visit Date: 08/08/2022              Requested by: No referring provider defined for this encounter. PCP: Pcp, No  Chief Complaint  Patient presents with   Right Leg - Follow-up      HPI: The patient is a 71 year old woman who presents today in routine follow-up for venous and lymphatic insufficiency ulceration and edema to bilateral lower extremities especially the right.  She is undergoing compression wrapping serially on the right.   Assessment & Plan: Visit Diagnoses:  1. Wound of right lower extremity, subsequent encounter   2. Venous insufficiency (chronic) (peripheral)     Plan: Compression wrap with Dynaflex reapplied to the right lower extremity patient in the process of obtaining lymphedema pumps.  Follow-Up Instructions: No follow-ups on file.   Ortho Exam  Patient is alert, oriented, no adenopathy, well-dressed, normal affect, normal respiratory effort. On examination of the right lower extremity there is good wrinkling of the skin from compression she has a large lateral leg wound this is shown and attached images.  The wound size is stable there is no surrounding erythema no weeping or drainage no sign of infection   Imaging: No results found.  Labs: Lab Results  Component Value Date   HGBA1C 5.8 04/13/2020   HGBA1C 5.4 06/29/2017   ESRSEDRATE 30 (H) 03/21/2010   CRP 13.2 (H) 03/21/2010   REPTSTATUS 03/25/2010 FINAL 03/21/2010   REPTSTATUS 03/21/2010 FINAL 03/21/2010   GRAMSTAIN  03/21/2010    RARE WBC PRESENT, PREDOMINANTLY PMN NO ORGANISMS SEEN Gram Stain Report Called to,Read Back By and Verified With: Gram Stain Report Called to,Read Back By and Verified With: DR [facility] 2130 03/21/10 BY K PUGH Performed at [place]  03/21/2010    RARE WBC PRESENT, PREDOMINANTLY MONONUCLEAR NO ORGANISMS SEEN CALLED TO DR, LANDAU,  2130,03/21/10,K.PUGH   CULT NO GROWTH 3 DAYS 03/21/2010   LABORGA STAPHYLOCOCCUS AUREUS 03/21/2010   LABORGA STAPHYLOCOCCUS AUREUS 03/21/2010     Lab Results  Component Value Date   ALBUMIN 3.8 05/05/2022   ALBUMIN 4.5 08/22/2021   ALBUMIN 3.9 04/13/2020    No results found for: "MG" Lab Results  Component Value Date   VD25OH 95.80 05/05/2022   VD25OH 50.37 04/13/2020   VD25OH 46.01 02/17/2017    No results found for: "PREALBUMIN"    Latest Ref Rng & Units 05/05/2022   11:46 AM 08/22/2021    8:22 PM 04/13/2020    9:29 AM  CBC EXTENDED  WBC 4.0 - 10.5 K/uL 5.7  5.3  4.7   RBC 3.87 - 5.11 Mil/uL 4.26  5.14  4.72   Hemoglobin 12.0 - 15.0 g/dL 12.4  14.6  12.3   HCT 36.0 - 46.0 % 37.3  45.0  37.2   Platelets 150.0 - 400.0 K/uL 262.0  238  219.0   NEUT# 1.4 - 7.7 K/uL 4.0  2.7  2.7   Lymph# 0.7 - 4.0 K/uL 1.0  1.8  1.4      There is no height or weight on file to calculate BMI.  Orders:  No orders of the defined types were placed in this encounter.  No orders of the defined types were placed in this encounter.    Procedures: No procedures performed  Clinical  Data: No additional findings.  ROS:  All other systems negative, except as noted in the HPI. Review of Systems  Objective: Vital Signs: There were no vitals taken for this visit.  Specialty Comments:  No specialty comments available.  PMFS History: Patient Active Problem List   Diagnosis Date Noted   Morbid obesity ([place]) 04/24/2022   Leg injury 04/24/2022   History of colonic polyps    Polyp of rectum    Difficult intubation 05/29/2020   Chronic vulvitis 04/20/2020   Status post abdominoplasty 03/13/2020   History of bariatric surgery 03/13/2020   Mixed conductive and sensorineural hearing loss of both ears 10/24/2019   Sensorineural hearing loss (SNHL), bilateral 10/24/2019   Chronic pain disorder 09/10/2018   Compression fracture of thoracic vertebra ([place]) 07/13/2018   Panniculitis 06/18/2018    Asthma, intermittent, uncomplicated 26/71/2458   GERD (gastroesophageal reflux disease) 03/02/2018   Ductal carcinoma in situ (DCIS) of left breast 03/01/2018   Degeneration of lumbar intervertebral disc 12/25/2017   Spinal stenosis of lumbar region 12/25/2017   Intertriginous candidiasis 10/02/2017   Polyp of transverse colon    Special screening for malignant neoplasms, colon    Vitamin D deficiency 02/16/2017   RLS (restless legs syndrome) 09/09/2016   Insomnia 07/11/2016   Anxiety and depression 07/11/2016   Generalized osteoarthritis of multiple sites 07/11/2016   Class 2 obesity with body mass index (BMI) of 36.0 to 36.9 in adult 01/28/2016   Shoulder pain 12/28/2014   Osteopenia 12/28/2014   Hot flashes 08/02/2014   Constipation 08/02/2014   Malignant neoplasm of upper-outer quadrant of left breast in female, estrogen receptor positive ([place]) 03/24/2014   CANDIDIASIS, SKIN 10/15/2009   PURE HYPERCHOLESTEROLEMIA 04/25/2009   Hyperlipidemia, acquired 02/16/2009   Sleep apnea 02/16/2009   Depression 06/18/2007   Essential hypertension 06/18/2007   Past Medical History:  Diagnosis Date   Allergy    seasonal allergies ([place])   Anxiety    on meds   Arthritis    knees    Asthma    rare;only when around alot of dust-Ventolin inhaler as needed   Breast cancer ([place])     left. States she did not have lymph nodes removed   Bursitis of right shoulder    Cataract    not had sx as of yet   Cellulitis and abscess of leg    Complication of anesthesia    was told 01/06/14 that airway was small   Depression    takes Effexor and Wellbutrin daily   GERD (gastroesophageal reflux disease)    on meds   Hearing loss    History of bronchitis 1966   Hyperlipidemia    on meds   Hypertension    takes Coreg and Losartan daily   Joint pain    Joint swelling    Neuromuscular disorder (HCC)    RLS   Obese    Peripheral edema    takes Furosemide daily as needed and Potassium daily    Personal history of radiation therapy 2015   Radiation 03/08/14-04/26/14   50.4 gray to left breast. Lumpectomy cavity boosted to 64.4 gray   Restless legs syndrome    takes depakote   Sleep apnea    uses CPAP   Sleep apnea, obstructive    uses CPAP   Wears glasses     Family History  Problem Relation Age of Onset   Dementia Mother 15   Heart attack Mother 39   Esophageal cancer Father 47  Heart attack Father 42   Stomach cancer Father 24   Colon polyps Brother    Asthma Other    Hypertension Other    Thyroid disease Other    Heart attack Other    Throat cancer Paternal Grandfather    Colon cancer Neg Hx    Rectal cancer Neg Hx     Past Surgical History:  Procedure Laterality Date   BREAST LUMPECTOMY Left 2015   BREAST LUMPECTOMY WITH NEEDLE LOCALIZATION Left 01/24/2014   Procedure: BREAST LUMPECTOMY WITH NEEDLE LOCALIZATION;  Surgeon: Edward Jolly, MD;  Location: [place] [facility];  Service: General;  Laterality: Left;   COLONOSCOPY N/A 03/20/2017   Procedure: COLONOSCOPY;  Surgeon: Mauri Pole, MD;  [facility];  Service: Endoscopy;  Laterality: N/A;   COLONOSCOPY  2018   COLONOSCOPY WITH PROPOFOL N/A 08/21/2020   Procedure: COLONOSCOPY WITH PROPOFOL;  Surgeon: Mauri Pole, MD;  [facility];  Service: Endoscopy;  Laterality: N/A;   DILATATION & CURETTAGE/HYSTEROSCOPY WITH TRUECLEAR N/A 01/06/2014   Procedure: DILATATION & CURETTAGE/HYSTEROSCOPY WITH TRUCLEAR;  Surgeon: Shon Millet II, MD;  Location: [facility];  Service: Gynecology;  Laterality: N/A;   HYSTEROPLASTY  01/2014   INNER EAR SURGERY Bilateral    for hearing loss   JOINT REPLACEMENT Right    Knee   LAPAROSCOPIC GASTRIC SLEEVE RESECTION N/A 01/28/2016   Procedure: LAPAROSCOPIC GASTRIC SLEEVE RESECTION WITH UPPER ENDO;  Surgeon: Excell Seltzer, MD;  Location: [facility];  Service: General;  Laterality: N/A;   LIPOSUCTION WITH LIPOFILLING Bilateral 06/20/2019   Procedure: BILATERAL  THIGH LIPECTOMY;  Surgeon: Irene Limbo, MD;  Location: [place];  Service: Plastics;  Laterality: Bilateral;   PANNICULECTOMY N/A 06/18/2018   Procedure: PANNICULECTOMY;  Surgeon: Irene Limbo, MD;  Location: [facility];  Service: Plastics;  Laterality: N/A;   POLYPECTOMY  08/21/2020   Procedure: POLYPECTOMY;  Surgeon: Mauri Pole, MD;  [facility];  Service: Endoscopy;;   RE-EXCISION OF BREAST LUMPECTOMY Left 02/02/2014   Procedure: RE-EXCISION OF LEFT BREAST LUMPECTOMY;  Surgeon: Edward Jolly, MD;  Location: [place];  Service: General;  Laterality: Left;   TUBAL LIGATION     WISDOM TOOTH EXTRACTION  1970   Social History   Occupational History   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

## 2022-08-14 DIAGNOSIS — Z23 Encounter for immunization: Secondary | ICD-10-CM | POA: Diagnosis not present

## 2022-08-14 DIAGNOSIS — G2581 Restless legs syndrome: Secondary | ICD-10-CM | POA: Diagnosis not present

## 2022-08-14 DIAGNOSIS — R42 Dizziness and giddiness: Secondary | ICD-10-CM | POA: Diagnosis not present

## 2022-08-14 DIAGNOSIS — I10 Essential (primary) hypertension: Secondary | ICD-10-CM | POA: Diagnosis not present

## 2022-08-14 DIAGNOSIS — E782 Mixed hyperlipidemia: Secondary | ICD-10-CM | POA: Diagnosis not present

## 2022-08-15 ENCOUNTER — Ambulatory Visit: Payer: Medicare HMO | Admitting: Family

## 2022-08-15 DIAGNOSIS — S81801D Unspecified open wound, right lower leg, subsequent encounter: Secondary | ICD-10-CM | POA: Diagnosis not present

## 2022-08-15 DIAGNOSIS — B354 Tinea corporis: Secondary | ICD-10-CM

## 2022-08-15 DIAGNOSIS — I872 Venous insufficiency (chronic) (peripheral): Secondary | ICD-10-CM

## 2022-08-19 ENCOUNTER — Encounter: Payer: Self-pay | Admitting: Family

## 2022-08-19 NOTE — Progress Notes (Signed)
Office Visit Note   Patient: Tamara [name]           Date of Birth: 06-12-1951           MRN: [id number] Visit Date: 08/15/2022              Requested by: No referring provider defined for this encounter. PCP: Pcp, No  Chief Complaint  Patient presents with   Right Leg - Follow-up      HPI: The patient is a 71 year old woman who is seen in follow-up for venous and lymphatic insufficiency ulcerations and edema especially right worse than left.  She has been undergoing serial compression wrapping on the right  Assessment & Plan: Visit Diagnoses: No diagnosis found.  Plan: We will reapply the compression garment to the right lower extremity.  We will continue working on her lymphedema pump approval.  Silver cell strips to the popliteal fossa on the right recommended the patient get some over-the-counter antifungal powder to apply to the yeast burden in the right popliteal fossa  Follow-Up Instructions: No follow-ups on file.   Ortho Exam  Patient is alert, oriented, no adenopathy, well-dressed, normal affect, normal respiratory effort. On examination of the right lower extremity In the popliteal fossa and skin folds she has new erythema warmth and some white discharge.  This is consistent with a yeast infection.  there is decrease in swelling from the compression wrapping.  She has a stable lateral leg wound on the right this is seen and images that are attached.  Wound size is 4.8 cm x 1-1/2 cm today.  There is no active drainage no weeping no erythema or warmth to her lower leg  Imaging: No results found. No images are attached to the encounter.  Labs: Lab Results  Component Value Date   HGBA1C 5.8 04/13/2020   HGBA1C 5.4 06/29/2017   ESRSEDRATE 30 (H) 03/21/2010   CRP 13.2 (H) 03/21/2010   REPTSTATUS 03/25/2010 FINAL 03/21/2010   REPTSTATUS 03/21/2010 FINAL 03/21/2010   GRAMSTAIN  03/21/2010    RARE WBC PRESENT, PREDOMINANTLY PMN NO ORGANISMS SEEN Gram Stain  Report Called to,Read Back By and Verified With: Gram Stain Report Called to,Read Back By and Verified With: DR [facility] 2130 03/21/10 BY K PUGH Performed at [place]  03/21/2010    RARE WBC PRESENT, PREDOMINANTLY MONONUCLEAR NO ORGANISMS SEEN CALLED TO DR, LANDAU, 2130,03/21/10,K.PUGH   CULT NO GROWTH 3 DAYS 03/21/2010   LABORGA STAPHYLOCOCCUS AUREUS 03/21/2010   LABORGA STAPHYLOCOCCUS AUREUS 03/21/2010     Lab Results  Component Value Date   ALBUMIN 3.8 05/05/2022   ALBUMIN 4.5 08/22/2021   ALBUMIN 3.9 04/13/2020    No results found for: "MG" Lab Results  Component Value Date   VD25OH 95.80 05/05/2022   VD25OH 50.37 04/13/2020   VD25OH 46.01 02/17/2017    No results found for: "PREALBUMIN"    Latest Ref Rng & Units 05/05/2022   11:46 AM 08/22/2021    8:22 PM 04/13/2020    9:29 AM  CBC EXTENDED  WBC 4.0 - 10.5 K/uL 5.7  5.3  4.7   RBC 3.87 - 5.11 Mil/uL 4.26  5.14  4.72   Hemoglobin 12.0 - 15.0 g/dL 12.4  14.6  12.3   HCT 36.0 - 46.0 % 37.3  45.0  37.2   Platelets 150.0 - 400.0 K/uL 262.0  238  219.0   NEUT# 1.4 - 7.7 K/uL 4.0  2.7  2.7   Lymph# 0.7 -  4.0 K/uL 1.0  1.8  1.4      There is no height or weight on file to calculate BMI.  Orders:  No orders of the defined types were placed in this encounter.  No orders of the defined types were placed in this encounter.    Procedures: No procedures performed  Clinical Data: No additional findings.  ROS:  All other systems negative, except as noted in the HPI. Review of Systems  Objective: Vital Signs: There were no vitals taken for this visit.  Specialty Comments:  No specialty comments available.  PMFS History: Patient Active Problem List   Diagnosis Date Noted   Morbid obesity ([place]) 04/24/2022   Leg injury 04/24/2022   History of colonic polyps    Polyp of rectum    Difficult intubation 05/29/2020   Chronic vulvitis 04/20/2020   Status post abdominoplasty 03/13/2020   History  of bariatric surgery 03/13/2020   Mixed conductive and sensorineural hearing loss of both ears 10/24/2019   Sensorineural hearing loss (SNHL), bilateral 10/24/2019   Chronic pain disorder 09/10/2018   Compression fracture of thoracic vertebra ([place]) 07/13/2018   Panniculitis 06/18/2018   Asthma, intermittent, uncomplicated 03/49/1791   GERD (gastroesophageal reflux disease) 03/02/2018   Ductal carcinoma in situ (DCIS) of left breast 03/01/2018   Degeneration of lumbar intervertebral disc 12/25/2017   Spinal stenosis of lumbar region 12/25/2017   Intertriginous candidiasis 10/02/2017   Polyp of transverse colon    Special screening for malignant neoplasms, colon    Vitamin D deficiency 02/16/2017   RLS (restless legs syndrome) 09/09/2016   Insomnia 07/11/2016   Anxiety and depression 07/11/2016   Generalized osteoarthritis of multiple sites 07/11/2016   Class 2 obesity with body mass index (BMI) of 36.0 to 36.9 in adult 01/28/2016   Shoulder pain 12/28/2014   Osteopenia 12/28/2014   Hot flashes 08/02/2014   Constipation 08/02/2014   Malignant neoplasm of upper-outer quadrant of left breast in female, estrogen receptor positive ([place]) 03/24/2014   CANDIDIASIS, SKIN 10/15/2009   PURE HYPERCHOLESTEROLEMIA 04/25/2009   Hyperlipidemia, acquired 02/16/2009   Sleep apnea 02/16/2009   Depression 06/18/2007   Essential hypertension 06/18/2007   Past Medical History:  Diagnosis Date   Allergy    seasonal allergies ([place])   Anxiety    on meds   Arthritis    knees    Asthma    rare;only when around alot of dust-Ventolin inhaler as needed   Breast cancer ([place])     left. States she did not have lymph nodes removed   Bursitis of right shoulder    Cataract    not had sx as of yet   Cellulitis and abscess of leg    Complication of anesthesia    was told 01/06/14 that airway was small   Depression    takes Effexor and Wellbutrin daily   GERD (gastroesophageal reflux disease)    on meds    Hearing loss    History of bronchitis 1966   Hyperlipidemia    on meds   Hypertension    takes Coreg and Losartan daily   Joint pain    Joint swelling    Neuromuscular disorder ([place])    RLS   Obese    Peripheral edema    takes Furosemide daily as needed and Potassium daily   Personal history of radiation therapy 2015   Radiation 03/08/14-04/26/14   50.4 gray to left breast. Lumpectomy cavity boosted to 64.4 gray   Restless legs syndrome  takes depakote   Sleep apnea    uses CPAP   Sleep apnea, obstructive    uses CPAP   Wears glasses     Family History  Problem Relation Age of Onset   Dementia Mother 12   Heart attack Mother 65   Esophageal cancer Father 57   Heart attack Father 73   Stomach cancer Father 5   Colon polyps Brother    Asthma Other    Hypertension Other    Thyroid disease Other    Heart attack Other    Throat cancer Paternal Grandfather    Colon cancer Neg Hx    Rectal cancer Neg Hx     Past Surgical History:  Procedure Laterality Date   BREAST LUMPECTOMY Left 2015   BREAST LUMPECTOMY WITH NEEDLE LOCALIZATION Left 01/24/2014   Procedure: BREAST LUMPECTOMY WITH NEEDLE LOCALIZATION;  Surgeon: Edward Jolly, MD;  Location: [facility];  Service: General;  Laterality: Left;   COLONOSCOPY N/A 03/20/2017   Procedure: COLONOSCOPY;  Surgeon: Mauri Pole, MD;  [facility];  Service: Endoscopy;  Laterality: N/A;   COLONOSCOPY  2018   COLONOSCOPY WITH PROPOFOL N/A 08/21/2020   Procedure: COLONOSCOPY WITH PROPOFOL;  Surgeon: Mauri Pole, MD;  [facility];  Service: Endoscopy;  Laterality: N/A;   DILATATION & CURETTAGE/HYSTEROSCOPY WITH TRUECLEAR N/A 01/06/2014   Procedure: DILATATION & CURETTAGE/HYSTEROSCOPY WITH TRUCLEAR;  Surgeon: Shon Millet II, MD;  Location: [facility];  Service: Gynecology;  Laterality: N/A;   HYSTEROPLASTY  01/2014   INNER EAR SURGERY Bilateral    for hearing loss   JOINT REPLACEMENT Right    Knee    LAPAROSCOPIC GASTRIC SLEEVE RESECTION N/A 01/28/2016   Procedure: LAPAROSCOPIC GASTRIC SLEEVE RESECTION WITH UPPER ENDO;  Surgeon: Excell Seltzer, MD;  [facility];  Service: General;  Laterality: N/A;   LIPOSUCTION WITH LIPOFILLING Bilateral 06/20/2019   Procedure: BILATERAL THIGH LIPECTOMY;  Surgeon: Irene Limbo, MD;  Location: [place];  Service: Plastics;  Laterality: Bilateral;   PANNICULECTOMY N/A 06/18/2018   Procedure: PANNICULECTOMY;  Surgeon: Irene Limbo, MD;  Location: [facility];  Service: Plastics;  Laterality: N/A;   POLYPECTOMY  08/21/2020   Procedure: POLYPECTOMY;  Surgeon: Mauri Pole, MD;  [facility];  Service: Endoscopy;;   RE-EXCISION OF BREAST LUMPECTOMY Left 02/02/2014   Procedure: RE-EXCISION OF LEFT BREAST LUMPECTOMY;  Surgeon: Edward Jolly, MD;  Location: [facility];  Service: General;  Laterality: Left;   TUBAL LIGATION     WISDOM TOOTH EXTRACTION  1970   Social History   Occupational History   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

## 2022-08-22 ENCOUNTER — Ambulatory Visit: Payer: Medicare HMO | Admitting: Family

## 2022-08-22 DIAGNOSIS — S81801D Unspecified open wound, right lower leg, subsequent encounter: Secondary | ICD-10-CM | POA: Diagnosis not present

## 2022-08-22 DIAGNOSIS — I890 Lymphedema, not elsewhere classified: Secondary | ICD-10-CM | POA: Diagnosis not present

## 2022-08-27 ENCOUNTER — Encounter: Payer: Self-pay | Admitting: Family

## 2022-08-27 ENCOUNTER — Ambulatory Visit: Payer: Medicare HMO | Admitting: Family

## 2022-08-27 DIAGNOSIS — I890 Lymphedema, not elsewhere classified: Secondary | ICD-10-CM

## 2022-08-27 DIAGNOSIS — I872 Venous insufficiency (chronic) (peripheral): Secondary | ICD-10-CM

## 2022-08-27 DIAGNOSIS — S81801D Unspecified open wound, right lower leg, subsequent encounter: Secondary | ICD-10-CM | POA: Diagnosis not present

## 2022-08-27 NOTE — Progress Notes (Signed)
Office Visit Note   Patient: Tamara [name]           Date of Birth: 01-Sep-1951           MRN: [id number] Visit Date: 08/27/2022              Requested by: No referring provider defined for this encounter. PCP: Pcp, No  Chief Complaint  Patient presents with   Right Leg - Follow-up      HPI: The patient is a 71 year old woman who is seen in follow-up for venous and lymphatic insufficiency ulcerations and edema especially right worse than left.  She has been undergoing serial compression wrapping on the right  Despite 4 weeks of compression, exercise, elevation she has failed to have any improvement in her edema.    [id number] "entre plus" demonstrated on August 19, 2022 this failed secondary to having no clinical improvement The fungal dermatitis to the popliteal fossa is nearly resolved she has been using a silver cell dressing  Assessment & Plan: Visit Diagnoses: No diagnosis found.  Plan: We will reapply the compression garment to the right lower extremity.    will continue working on her lymphedema pump approval.  We are also working on approval for an office Kerecis grafting.   Follow-Up Instructions: Return in about 1 week (around 09/03/2022).   Ortho Exam  Patient is alert, oriented, no adenopathy, well-dressed, normal affect, normal respiratory effort.   There is chest and trunk edema. On examination bilateral lower extremity she has venous and lymphatic edema with papillomas present bilaterally.    There is modest improvement in the edema from the compression wrapping with wrinkling of the skin.  The ulcer to the lateral right lower leg is stable with modest decrease in size is now 4 and half centimeters by 1 and half centimeters.  There is scant bloody drainage no erythema no warmth no maceration  Donated Kerecis graft sheet applied today cut to fit.   Imaging: No results found. No images are attached to the encounter.  Labs: Lab Results  Component  Value Date   HGBA1C 5.8 04/13/2020   HGBA1C 5.4 06/29/2017   ESRSEDRATE 30 (H) 03/21/2010   CRP 13.2 (H) 03/21/2010   REPTSTATUS 03/25/2010 FINAL 03/21/2010   REPTSTATUS 03/21/2010 FINAL 03/21/2010   GRAMSTAIN  03/21/2010    RARE WBC PRESENT, PREDOMINANTLY PMN NO ORGANISMS SEEN Gram Stain Report Called to,Read Back By and Verified With: Gram Stain Report Called to,Read Back By and Verified With: DR [facility] 2130 03/21/10 BY K PUGH Performed at [facility]  03/21/2010    RARE WBC PRESENT, PREDOMINANTLY MONONUCLEAR NO ORGANISMS SEEN CALLED TO DR, LANDAU, 2130,03/21/10,K.PUGH   CULT NO GROWTH 3 DAYS 03/21/2010   LABORGA STAPHYLOCOCCUS AUREUS 03/21/2010   LABORGA STAPHYLOCOCCUS AUREUS 03/21/2010     Lab Results  Component Value Date   ALBUMIN 3.8 05/05/2022   ALBUMIN 4.5 08/22/2021   ALBUMIN 3.9 04/13/2020    No results found for: "MG" Lab Results  Component Value Date   VD25OH 95.80 05/05/2022   VD25OH 50.37 04/13/2020   VD25OH 46.01 02/17/2017    No results found for: "PREALBUMIN"    Latest Ref Rng & Units 05/05/2022   11:46 AM 08/22/2021    8:22 PM 04/13/2020    9:29 AM  CBC EXTENDED  WBC 4.0 - 10.5 K/uL 5.7  5.3  4.7   RBC 3.87 - 5.11 Mil/uL 4.26  5.14  4.72   Hemoglobin 12.0 -  15.0 g/dL 12.4  14.6  12.3   HCT 36.0 - 46.0 % 37.3  45.0  37.2   Platelets 150.0 - 400.0 K/uL 262.0  238  219.0   NEUT# 1.4 - 7.7 K/uL 4.0  2.7  2.7   Lymph# 0.7 - 4.0 K/uL 1.0  1.8  1.4      There is no height or weight on file to calculate BMI.  Orders:  No orders of the defined types were placed in this encounter.  No orders of the defined types were placed in this encounter.    Procedures: No procedures performed  Clinical Data: No additional findings.  ROS:  All other systems negative, except as noted in the HPI. Review of Systems  Objective: Vital Signs: There were no vitals taken for this visit.  Specialty Comments:  No specialty comments  available.  PMFS History: Patient Active Problem List   Diagnosis Date Noted   Morbid obesity ([place]) 04/24/2022   Leg injury 04/24/2022   History of colonic polyps    Polyp of rectum    Difficult intubation 05/29/2020   Chronic vulvitis 04/20/2020   Status post abdominoplasty 03/13/2020   History of bariatric surgery 03/13/2020   Mixed conductive and sensorineural hearing loss of both ears 10/24/2019   Sensorineural hearing loss (SNHL), bilateral 10/24/2019   Chronic pain disorder 09/10/2018   Compression fracture of thoracic vertebra ([place]) 07/13/2018   Panniculitis 06/18/2018   Asthma, intermittent, uncomplicated 73/71/0626   GERD (gastroesophageal reflux disease) 03/02/2018   Ductal carcinoma in situ (DCIS) of left breast 03/01/2018   Degeneration of lumbar intervertebral disc 12/25/2017   Spinal stenosis of lumbar region 12/25/2017   Intertriginous candidiasis 10/02/2017   Polyp of transverse colon    Special screening for malignant neoplasms, colon    Vitamin D deficiency 02/16/2017   RLS (restless legs syndrome) 09/09/2016   Insomnia 07/11/2016   Anxiety and depression 07/11/2016   Generalized osteoarthritis of multiple sites 07/11/2016   Class 2 obesity with body mass index (BMI) of 36.0 to 36.9 in adult 01/28/2016   Shoulder pain 12/28/2014   Osteopenia 12/28/2014   Hot flashes 08/02/2014   Constipation 08/02/2014   Malignant neoplasm of upper-outer quadrant of left breast in female, estrogen receptor positive ([place]) 03/24/2014   CANDIDIASIS, SKIN 10/15/2009   PURE HYPERCHOLESTEROLEMIA 04/25/2009   Hyperlipidemia, acquired 02/16/2009   Sleep apnea 02/16/2009   Depression 06/18/2007   Essential hypertension 06/18/2007   Past Medical History:  Diagnosis Date   Allergy    seasonal allergies (Fall)   Anxiety    on meds   Arthritis    knees    Asthma    rare;only when around alot of dust-Ventolin inhaler as needed   Breast cancer ([place])     left. States she did  not have lymph nodes removed   Bursitis of right shoulder    Cataract    not had sx as of yet   Cellulitis and abscess of leg    Complication of anesthesia    was told 01/06/14 that airway was small   Depression    takes Effexor and Wellbutrin daily   GERD (gastroesophageal reflux disease)    on meds   Hearing loss    History of bronchitis 1966   Hyperlipidemia    on meds   Hypertension    takes Coreg and Losartan daily   Joint pain    Joint swelling    Neuromuscular disorder (HCC)    RLS  Obese    Peripheral edema    takes Furosemide daily as needed and Potassium daily   Personal history of radiation therapy 2015   Radiation 03/08/14-04/26/14   50.4 gray to left breast. Lumpectomy cavity boosted to 64.4 gray   Restless legs syndrome    takes depakote   Sleep apnea    uses CPAP   Sleep apnea, obstructive    uses CPAP   Wears glasses     Family History  Problem Relation Age of Onset   Dementia Mother 5   Heart attack Mother 74   Esophageal cancer Father 77   Heart attack Father 3   Stomach cancer Father 60   Colon polyps Brother    Asthma Other    Hypertension Other    Thyroid disease Other    Heart attack Other    Throat cancer Paternal Grandfather    Colon cancer Neg Hx    Rectal cancer Neg Hx     Past Surgical History:  Procedure Laterality Date   BREAST LUMPECTOMY Left 2015   BREAST LUMPECTOMY WITH NEEDLE LOCALIZATION Left 01/24/2014   Procedure: BREAST LUMPECTOMY WITH NEEDLE LOCALIZATION;  Surgeon: Edward Jolly, MD;  Location: [place];  Service: General;  Laterality: Left;   COLONOSCOPY N/A 03/20/2017   Procedure: COLONOSCOPY;  Surgeon: Mauri Pole, MD;  [facility];  Service: Endoscopy;  Laterality: N/A;   COLONOSCOPY  2018   COLONOSCOPY WITH PROPOFOL N/A 08/21/2020   Procedure: COLONOSCOPY WITH PROPOFOL;  Surgeon: Mauri Pole, MD;  [facility];  Service: Endoscopy;  Laterality: N/A;   DILATATION &  CURETTAGE/HYSTEROSCOPY WITH TRUECLEAR N/A 01/06/2014   Procedure: DILATATION & CURETTAGE/HYSTEROSCOPY WITH TRUCLEAR;  Surgeon: Shon Millet II, MD;  Location: [facility];  Service: Gynecology;  Laterality: N/A;   HYSTEROPLASTY  01/2014   INNER EAR SURGERY Bilateral    for hearing loss   JOINT REPLACEMENT Right    Knee   LAPAROSCOPIC GASTRIC SLEEVE RESECTION N/A 01/28/2016   Procedure: LAPAROSCOPIC GASTRIC SLEEVE RESECTION WITH UPPER ENDO;  Surgeon: Excell Seltzer, MD;  Location: [facility];  Service: General;  Laterality: N/A;   LIPOSUCTION WITH LIPOFILLING Bilateral 06/20/2019   Procedure: BILATERAL THIGH LIPECTOMY;  Surgeon: Irene Limbo, MD;  Location: [place];  Service: Plastics;  Laterality: Bilateral;   PANNICULECTOMY N/A 06/18/2018   Procedure: PANNICULECTOMY;  Surgeon: Irene Limbo, MD;  Location: [place];  Service: Plastics;  Laterality: N/A;   POLYPECTOMY  08/21/2020   Procedure: POLYPECTOMY;  Surgeon: Mauri Pole, MD;  [facility];  Service: Endoscopy;;   RE-EXCISION OF BREAST LUMPECTOMY Left 02/02/2014   Procedure: RE-EXCISION OF LEFT BREAST LUMPECTOMY;  Surgeon: Edward Jolly, MD;  Location: [place];  Service: General;  Laterality: Left;   TUBAL LIGATION     WISDOM TOOTH EXTRACTION  1970   Social History   Occupational History   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

## 2022-08-29 ENCOUNTER — Telehealth: Payer: Self-pay | Admitting: Orthopedic Surgery

## 2022-08-29 ENCOUNTER — Encounter: Payer: Self-pay | Admitting: Family

## 2022-08-29 NOTE — Telephone Encounter (Signed)
Tamara [name] if you are interested in doing a peer to peer on behalf of the pt for [name] approval please see information below. She will be in the office next week and we did use a donation piece of graft on her this week.

## 2022-08-29 NOTE — Telephone Encounter (Signed)
Elmyra Ricks from [place] called on behalf of their disability dept. States that they are attending denial on pt's disability and are asking for DR Sharol Given or PA Junie Panning to do a peer to peer on behalf of pt. Elmyra Ricks state deadline for Dr Sharol Given to call and do peer to peer by 10/3 by 1 pm. Pt's reference number is [id number]. Elmyra Ricks phone number is [phone].

## 2022-08-29 NOTE — Progress Notes (Signed)
Office Visit Note   Patient: Tamara [name]           Date of Birth: 10-Nov-1951           MRN: [id number] Visit Date: 08/22/2022              Requested by: No referring provider defined for this encounter. PCP: Pcp, No  Chief Complaint  Patient presents with   Right Leg - Follow-up      HPI: The patient is a 71 year old woman who is seen in follow-up for venous and lymphatic insufficiency ulcerations and edema. right worse than left.  She has been undergoing serial compression wrapping on the right  Assessment & Plan: Visit Diagnoses: No diagnosis found.  Plan: We will reapply the compression garment to the right lower extremity.  We will continue working on her lymphedema pump approval.  Follow-Up Instructions: Return in about 1 week (around 08/29/2022).   Ortho Exam  Patient is alert, oriented, no adenopathy, well-dressed, normal affect, normal respiratory effort. On examination of the right lower extremity   She has a stable lateral leg wound on the right this is seen and images that are attached.  Wound size is 4.3 cm x 1-1/2 cm today.  There is no active drainage no weeping no erythema or warmth to her lower leg  Imaging: No results found. No images are attached to the encounter.  Labs: Lab Results  Component Value Date   HGBA1C 5.8 04/13/2020   HGBA1C 5.4 06/29/2017   ESRSEDRATE 30 (H) 03/21/2010   CRP 13.2 (H) 03/21/2010   REPTSTATUS 03/25/2010 FINAL 03/21/2010   REPTSTATUS 03/21/2010 FINAL 03/21/2010   GRAMSTAIN  03/21/2010    RARE WBC PRESENT, PREDOMINANTLY PMN NO ORGANISMS SEEN Gram Stain Report Called to,Read Back By and Verified With: Gram Stain Report Called to,Read Back By and Verified With: DR [facility] 2130 03/21/10 BY K PUGH Performed at [facility]  03/21/2010    RARE WBC PRESENT, PREDOMINANTLY MONONUCLEAR NO ORGANISMS SEEN CALLED TO DR, LANDAU, 2130,03/21/10,K.PUGH   CULT NO GROWTH 3 DAYS 03/21/2010   LABORGA STAPHYLOCOCCUS  AUREUS 03/21/2010   LABORGA STAPHYLOCOCCUS AUREUS 03/21/2010     Lab Results  Component Value Date   ALBUMIN 3.8 05/05/2022   ALBUMIN 4.5 08/22/2021   ALBUMIN 3.9 04/13/2020    No results found for: "MG" Lab Results  Component Value Date   VD25OH 95.80 05/05/2022   VD25OH 50.37 04/13/2020   VD25OH 46.01 02/17/2017    No results found for: "PREALBUMIN"    Latest Ref Rng & Units 05/05/2022   11:46 AM 08/22/2021    8:22 PM 04/13/2020    9:29 AM  CBC EXTENDED  WBC 4.0 - 10.5 K/uL 5.7  5.3  4.7   RBC 3.87 - 5.11 Mil/uL 4.26  5.14  4.72   Hemoglobin 12.0 - 15.0 g/dL 12.4  14.6  12.3   HCT 36.0 - 46.0 % 37.3  45.0  37.2   Platelets 150.0 - 400.0 K/uL 262.0  238  219.0   NEUT# 1.4 - 7.7 K/uL 4.0  2.7  2.7   Lymph# 0.7 - 4.0 K/uL 1.0  1.8  1.4      There is no height or weight on file to calculate BMI.  Orders:  No orders of the defined types were placed in this encounter.  No orders of the defined types were placed in this encounter.    Procedures: No procedures performed  Clinical Data: No additional  findings.  ROS:  All other systems negative, except as noted in the HPI. Review of Systems  Objective: Vital Signs: There were no vitals taken for this visit.  Specialty Comments:  No specialty comments available.  PMFS History: Patient Active Problem List   Diagnosis Date Noted   Morbid obesity ([place]) 04/24/2022   Leg injury 04/24/2022   History of colonic polyps    Polyp of rectum    Difficult intubation 05/29/2020   Chronic vulvitis 04/20/2020   Status post abdominoplasty 03/13/2020   History of bariatric surgery 03/13/2020   Mixed conductive and sensorineural hearing loss of both ears 10/24/2019   Sensorineural hearing loss (SNHL), bilateral 10/24/2019   Chronic pain disorder 09/10/2018   Compression fracture of thoracic vertebra ([place]) 07/13/2018   Panniculitis 06/18/2018   Asthma, intermittent, uncomplicated 87/86/7672   GERD (gastroesophageal  reflux disease) 03/02/2018   Ductal carcinoma in situ (DCIS) of left breast 03/01/2018   Degeneration of lumbar intervertebral disc 12/25/2017   Spinal stenosis of lumbar region 12/25/2017   Intertriginous candidiasis 10/02/2017   Polyp of transverse colon    Special screening for malignant neoplasms, colon    Vitamin D deficiency 02/16/2017   RLS (restless legs syndrome) 09/09/2016   Insomnia 07/11/2016   Anxiety and depression 07/11/2016   Generalized osteoarthritis of multiple sites 07/11/2016   Class 2 obesity with body mass index (BMI) of 36.0 to 36.9 in adult 01/28/2016   Shoulder pain 12/28/2014   Osteopenia 12/28/2014   Hot flashes 08/02/2014   Constipation 08/02/2014   Malignant neoplasm of upper-outer quadrant of left breast in female, estrogen receptor positive ([place]) 03/24/2014   CANDIDIASIS, SKIN 10/15/2009   PURE HYPERCHOLESTEROLEMIA 04/25/2009   Hyperlipidemia, acquired 02/16/2009   Sleep apnea 02/16/2009   Depression 06/18/2007   Essential hypertension 06/18/2007   Past Medical History:  Diagnosis Date   Allergy    seasonal allergies ([place])   Anxiety    on meds   Arthritis    knees    Asthma    rare;only when around alot of dust-Ventolin inhaler as needed   Breast cancer ([place])     left. States she did not have lymph nodes removed   Bursitis of right shoulder    Cataract    not had sx as of yet   Cellulitis and abscess of leg    Complication of anesthesia    was told 01/06/14 that airway was small   Depression    takes Effexor and Wellbutrin daily   GERD (gastroesophageal reflux disease)    on meds   Hearing loss    History of bronchitis 1966   Hyperlipidemia    on meds   Hypertension    takes Coreg and Losartan daily   Joint pain    Joint swelling    Neuromuscular disorder (HCC)    RLS   Obese    Peripheral edema    takes Furosemide daily as needed and Potassium daily   Personal history of radiation therapy 2015   Radiation 03/08/14-04/26/14    50.4 gray to left breast. Lumpectomy cavity boosted to 64.4 gray   Restless legs syndrome    takes depakote   Sleep apnea    uses CPAP   Sleep apnea, obstructive    uses CPAP   Wears glasses     Family History  Problem Relation Age of Onset   Dementia Mother 52   Heart attack Mother 73   Esophageal cancer Father 66   Heart attack Father  62   Stomach cancer Father 35   Colon polyps Brother    Asthma Other    Hypertension Other    Thyroid disease Other    Heart attack Other    Throat cancer Paternal Grandfather    Colon cancer Neg Hx    Rectal cancer Neg Hx     Past Surgical History:  Procedure Laterality Date   BREAST LUMPECTOMY Left 2015   BREAST LUMPECTOMY WITH NEEDLE LOCALIZATION Left 01/24/2014   Procedure: BREAST LUMPECTOMY WITH NEEDLE LOCALIZATION;  Surgeon: Edward Jolly, MD;  Location: [place];  Service: General;  Laterality: Left;   COLONOSCOPY N/A 03/20/2017   Procedure: COLONOSCOPY;  Surgeon: Mauri Pole, MD;  [facility];  Service: Endoscopy;  Laterality: N/A;   COLONOSCOPY  2018   COLONOSCOPY WITH PROPOFOL N/A 08/21/2020   Procedure: COLONOSCOPY WITH PROPOFOL;  Surgeon: Mauri Pole, MD;  [facility];  Service: Endoscopy;  Laterality: N/A;   DILATATION & CURETTAGE/HYSTEROSCOPY WITH TRUECLEAR N/A 01/06/2014   Procedure: DILATATION & CURETTAGE/HYSTEROSCOPY WITH TRUCLEAR;  Surgeon: Shon Millet II, MD;  Location: [facility];  Service: Gynecology;  Laterality: N/A;   HYSTEROPLASTY  01/2014   INNER EAR SURGERY Bilateral    for hearing loss   JOINT REPLACEMENT Right    Knee   LAPAROSCOPIC GASTRIC SLEEVE RESECTION N/A 01/28/2016   Procedure: LAPAROSCOPIC GASTRIC SLEEVE RESECTION WITH UPPER ENDO;  Surgeon: Excell Seltzer, MD;  [facility];  Service: General;  Laterality: N/A;   LIPOSUCTION WITH LIPOFILLING Bilateral 06/20/2019   Procedure: BILATERAL THIGH LIPECTOMY;  Surgeon: Irene Limbo, MD;  Location: [place];  Service:  Plastics;  Laterality: Bilateral;   PANNICULECTOMY N/A 06/18/2018   Procedure: PANNICULECTOMY;  Surgeon: Irene Limbo, MD;  Location: [place];  Service: Plastics;  Laterality: N/A;   POLYPECTOMY  08/21/2020   Procedure: POLYPECTOMY;  Surgeon: Mauri Pole, MD;  [facility];  Service: Endoscopy;;   RE-EXCISION OF BREAST LUMPECTOMY Left 02/02/2014   Procedure: RE-EXCISION OF LEFT BREAST LUMPECTOMY;  Surgeon: Edward Jolly, MD;  Location: [place];  Service: General;  Laterality: Left;   TUBAL LIGATION     WISDOM TOOTH EXTRACTION  1970   Social History   Occupational History   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

## 2022-09-02 ENCOUNTER — Other Ambulatory Visit: Payer: Self-pay | Admitting: Internal Medicine

## 2022-09-02 DIAGNOSIS — E785 Hyperlipidemia, unspecified: Secondary | ICD-10-CM | POA: Diagnosis not present

## 2022-09-02 DIAGNOSIS — I110 Hypertensive heart disease with heart failure: Secondary | ICD-10-CM | POA: Diagnosis not present

## 2022-09-02 DIAGNOSIS — F419 Anxiety disorder, unspecified: Secondary | ICD-10-CM

## 2022-09-02 DIAGNOSIS — L97919 Non-pressure chronic ulcer of unspecified part of right lower leg with unspecified severity: Secondary | ICD-10-CM | POA: Diagnosis not present

## 2022-09-02 DIAGNOSIS — J45909 Unspecified asthma, uncomplicated: Secondary | ICD-10-CM | POA: Diagnosis not present

## 2022-09-02 DIAGNOSIS — I739 Peripheral vascular disease, unspecified: Secondary | ICD-10-CM | POA: Diagnosis not present

## 2022-09-02 DIAGNOSIS — R69 Illness, unspecified: Secondary | ICD-10-CM | POA: Diagnosis not present

## 2022-09-02 DIAGNOSIS — I509 Heart failure, unspecified: Secondary | ICD-10-CM | POA: Diagnosis not present

## 2022-09-02 DIAGNOSIS — I10 Essential (primary) hypertension: Secondary | ICD-10-CM

## 2022-09-02 DIAGNOSIS — G2581 Restless legs syndrome: Secondary | ICD-10-CM | POA: Diagnosis not present

## 2022-09-02 DIAGNOSIS — M810 Age-related osteoporosis without current pathological fracture: Secondary | ICD-10-CM | POA: Diagnosis not present

## 2022-09-02 DIAGNOSIS — E261 Secondary hyperaldosteronism: Secondary | ICD-10-CM | POA: Diagnosis not present

## 2022-09-02 DIAGNOSIS — K219 Gastro-esophageal reflux disease without esophagitis: Secondary | ICD-10-CM

## 2022-09-03 ENCOUNTER — Ambulatory Visit: Payer: Medicare HMO | Admitting: Family

## 2022-09-03 DIAGNOSIS — I872 Venous insufficiency (chronic) (peripheral): Secondary | ICD-10-CM

## 2022-09-03 DIAGNOSIS — S81801D Unspecified open wound, right lower leg, subsequent encounter: Secondary | ICD-10-CM

## 2022-09-04 ENCOUNTER — Encounter: Payer: Self-pay | Admitting: Family

## 2022-09-04 NOTE — Progress Notes (Signed)
Office Visit Note   Patient: Tamara [name]           Date of Birth: 08-02-1951           MRN: [id number] Visit Date: 09/03/2022              Requested by: No referring provider defined for this encounter. PCP: Pcp, No  Chief Complaint  Patient presents with   Right Leg - Follow-up      HPI: The patient is a 71 year old woman who is seen in follow-up for venous and lymphatic insufficiency ulcerations and edema especially right worse than left.  She has been undergoing serial compression wrapping on the right Her palms have been improved.  She states that she is aware has been informed to call Megan with tactile to be set up with her pump training.  Did have some small donated graft material applied last week.  Improvement seen.  Assessment & Plan: Visit Diagnoses: No diagnosis found.  Plan: We will place her in medical compression stockings.  Applied today given an extra for changes instructed on use she may shower may get this wet will begin wearing her lymphedema pumps    Follow-Up Instructions: No follow-ups on file.   Ortho Exam  Patient is alert, oriented, no adenopathy, well-dressed, normal affect, normal respiratory effort.   There is chest and trunk edema. On examination bilateral lower extremity she has venous and lymphatic edema with papillomas present bilaterally.    There is modest improvement in the edema from the compression wrapping with wrinkling of the skin.  The ulcer to the lateral right lower leg is much improved now 5 mm in diameter with proud granulation tissue there is scant bloody drainage no erythema no warmth no maceration    Imaging: No results found. No images are attached to the encounter.  Labs: Lab Results  Component Value Date   HGBA1C 5.8 04/13/2020   HGBA1C 5.4 06/29/2017   ESRSEDRATE 30 (H) 03/21/2010   CRP 13.2 (H) 03/21/2010   REPTSTATUS 03/25/2010 FINAL 03/21/2010   REPTSTATUS 03/21/2010 FINAL 03/21/2010   GRAMSTAIN   03/21/2010    RARE WBC PRESENT, PREDOMINANTLY PMN NO ORGANISMS SEEN Gram Stain Report Called to,Read Back By and Verified With: Gram Stain Report Called to,Read Back By and Verified With: DR [facility] 2130 03/21/10 BY K PUGH Performed at [facility]  03/21/2010    RARE WBC PRESENT, PREDOMINANTLY MONONUCLEAR NO ORGANISMS SEEN CALLED TO DR, LANDAU, 2130,03/21/10,K.PUGH   CULT NO GROWTH 3 DAYS 03/21/2010   LABORGA STAPHYLOCOCCUS AUREUS 03/21/2010   LABORGA STAPHYLOCOCCUS AUREUS 03/21/2010     Lab Results  Component Value Date   ALBUMIN 3.8 05/05/2022   ALBUMIN 4.5 08/22/2021   ALBUMIN 3.9 04/13/2020    No results found for: "MG" Lab Results  Component Value Date   VD25OH 95.80 05/05/2022   VD25OH 50.37 04/13/2020   VD25OH 46.01 02/17/2017    No results found for: "PREALBUMIN"    Latest Ref Rng & Units 05/05/2022   11:46 AM 08/22/2021    8:22 PM 04/13/2020    9:29 AM  CBC EXTENDED  WBC 4.0 - 10.5 K/uL 5.7  5.3  4.7   RBC 3.87 - 5.11 Mil/uL 4.26  5.14  4.72   Hemoglobin 12.0 - 15.0 g/dL 12.4  14.6  12.3   HCT 36.0 - 46.0 % 37.3  45.0  37.2   Platelets 150.0 - 400.0 K/uL 262.0  238  219.0  NEUT# 1.4 - 7.7 K/uL 4.0  2.7  2.7   Lymph# 0.7 - 4.0 K/uL 1.0  1.8  1.4      There is no height or weight on file to calculate BMI.  Orders:  No orders of the defined types were placed in this encounter.  No orders of the defined types were placed in this encounter.    Procedures: No procedures performed  Clinical Data: No additional findings.  ROS:  All other systems negative, except as noted in the HPI. Review of Systems  Objective: Vital Signs: There were no vitals taken for this visit.  Specialty Comments:  No specialty comments available.  PMFS History: Patient Active Problem List   Diagnosis Date Noted   Morbid obesity ([place]) 04/24/2022   Leg injury 04/24/2022   History of colonic polyps    Polyp of rectum    Difficult intubation 05/29/2020    Chronic vulvitis 04/20/2020   Status post abdominoplasty 03/13/2020   History of bariatric surgery 03/13/2020   Mixed conductive and sensorineural hearing loss of both ears 10/24/2019   Sensorineural hearing loss (SNHL), bilateral 10/24/2019   Chronic pain disorder 09/10/2018   Compression fracture of thoracic vertebra ([place]) 07/13/2018   Panniculitis 06/18/2018   Asthma, intermittent, uncomplicated 26/20/3559   GERD (gastroesophageal reflux disease) 03/02/2018   Ductal carcinoma in situ (DCIS) of left breast 03/01/2018   Degeneration of lumbar intervertebral disc 12/25/2017   Spinal stenosis of lumbar region 12/25/2017   Intertriginous candidiasis 10/02/2017   Polyp of transverse colon    Special screening for malignant neoplasms, colon    Vitamin D deficiency 02/16/2017   RLS (restless legs syndrome) 09/09/2016   Insomnia 07/11/2016   Anxiety and depression 07/11/2016   Generalized osteoarthritis of multiple sites 07/11/2016   Class 2 obesity with body mass index (BMI) of 36.0 to 36.9 in adult 01/28/2016   Shoulder pain 12/28/2014   Osteopenia 12/28/2014   Hot flashes 08/02/2014   Constipation 08/02/2014   Malignant neoplasm of upper-outer quadrant of left breast in female, estrogen receptor positive ([place]) 03/24/2014   CANDIDIASIS, SKIN 10/15/2009   PURE HYPERCHOLESTEROLEMIA 04/25/2009   Hyperlipidemia, acquired 02/16/2009   Sleep apnea 02/16/2009   Depression 06/18/2007   Essential hypertension 06/18/2007   Past Medical History:  Diagnosis Date   Allergy    seasonal allergies ([place])   Anxiety    on meds   Arthritis    knees    Asthma    rare;only when around alot of dust-Ventolin inhaler as needed   Breast cancer ([place])     left. States she did not have lymph nodes removed   Bursitis of right shoulder    Cataract    not had sx as of yet   Cellulitis and abscess of leg    Complication of anesthesia    was told 01/06/14 that airway was small   Depression    takes  Effexor and Wellbutrin daily   GERD (gastroesophageal reflux disease)    on meds   Hearing loss    History of bronchitis 1966   Hyperlipidemia    on meds   Hypertension    takes Coreg and Losartan daily   Joint pain    Joint swelling    Neuromuscular disorder ([place])    RLS   Obese    Peripheral edema    takes Furosemide daily as needed and Potassium daily   Personal history of radiation therapy 2015   Radiation 03/08/14-04/26/14   50.4 gray  to left breast. Lumpectomy cavity boosted to 64.4 gray   Restless legs syndrome    takes depakote   Sleep apnea    uses CPAP   Sleep apnea, obstructive    uses CPAP   Wears glasses     Family History  Problem Relation Age of Onset   Dementia Mother 70   Heart attack Mother 79   Esophageal cancer Father 65   Heart attack Father 78   Stomach cancer Father 32   Colon polyps Brother    Asthma Other    Hypertension Other    Thyroid disease Other    Heart attack Other    Throat cancer Paternal Grandfather    Colon cancer Neg Hx    Rectal cancer Neg Hx     Past Surgical History:  Procedure Laterality Date   BREAST LUMPECTOMY Left 2015   BREAST LUMPECTOMY WITH NEEDLE LOCALIZATION Left 01/24/2014   Procedure: BREAST LUMPECTOMY WITH NEEDLE LOCALIZATION;  Surgeon: Edward Jolly, MD;  Location: [place];  Service: General;  Laterality: Left;   COLONOSCOPY N/A 03/20/2017   Procedure: COLONOSCOPY;  Surgeon: Mauri Pole, MD;  [facility];  Service: Endoscopy;  Laterality: N/A;   COLONOSCOPY  2018   COLONOSCOPY WITH PROPOFOL N/A 08/21/2020   Procedure: COLONOSCOPY WITH PROPOFOL;  Surgeon: Mauri Pole, MD;  [facility];  Service: Endoscopy;  Laterality: N/A;   DILATATION & CURETTAGE/HYSTEROSCOPY WITH TRUECLEAR N/A 01/06/2014   Procedure: DILATATION & CURETTAGE/HYSTEROSCOPY WITH TRUCLEAR;  Surgeon: Shon Millet II, MD;  Location: [facility];  Service: Gynecology;  Laterality: N/A;   HYSTEROPLASTY  01/2014   INNER  EAR SURGERY Bilateral    for hearing loss   JOINT REPLACEMENT Right    Knee   LAPAROSCOPIC GASTRIC SLEEVE RESECTION N/A 01/28/2016   Procedure: LAPAROSCOPIC GASTRIC SLEEVE RESECTION WITH UPPER ENDO;  Surgeon: Excell Seltzer, MD;  Location[facility];  Service: General;  Laterality: N/A;   LIPOSUCTION WITH LIPOFILLING Bilateral 06/20/2019   Procedure: BILATERAL THIGH LIPECTOMY;  Surgeon: Irene Limbo, MD;  Location: [place];  Service: Plastics;  Laterality: Bilateral;   PANNICULECTOMY N/A 06/18/2018   Procedure: PANNICULECTOMY;  Surgeon: Irene Limbo, MD;  Location: [place];  Service: Plastics;  Laterality: N/A;   POLYPECTOMY  08/21/2020   Procedure: POLYPECTOMY;  Surgeon: Mauri Pole, MD;  [facility];  Service: Endoscopy;;   RE-EXCISION OF BREAST LUMPECTOMY Left 02/02/2014   Procedure: RE-EXCISION OF LEFT BREAST LUMPECTOMY;  Surgeon: Edward Jolly, MD;  Location: [facility];  Service: General;  Laterality: Left;   TUBAL LIGATION     WISDOM TOOTH EXTRACTION  1970   Social History   Occupational History   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

## 2022-09-05 DIAGNOSIS — I890 Lymphedema, not elsewhere classified: Secondary | ICD-10-CM | POA: Diagnosis not present

## 2022-09-08 DIAGNOSIS — I10 Essential (primary) hypertension: Secondary | ICD-10-CM | POA: Diagnosis not present

## 2022-09-08 DIAGNOSIS — G4700 Insomnia, unspecified: Secondary | ICD-10-CM | POA: Diagnosis not present

## 2022-09-08 DIAGNOSIS — R69 Illness, unspecified: Secondary | ICD-10-CM | POA: Diagnosis not present

## 2022-09-10 ENCOUNTER — Ambulatory Visit: Payer: Medicare HMO | Admitting: Family

## 2022-09-10 ENCOUNTER — Encounter: Payer: Self-pay | Admitting: Family

## 2022-09-10 DIAGNOSIS — I890 Lymphedema, not elsewhere classified: Secondary | ICD-10-CM

## 2022-09-10 DIAGNOSIS — S81801D Unspecified open wound, right lower leg, subsequent encounter: Secondary | ICD-10-CM | POA: Diagnosis not present

## 2022-09-10 NOTE — Progress Notes (Signed)
Office Visit Note   Patient: Tamara [name]           Date of Birth: [birth year]           MRN: [id number] Visit Date: 09/10/2022              Requested by: No referring provider defined for this encounter. PCP: Pcp, No  Chief Complaint  Patient presents with   Right Leg - Follow-up      HPI: The patient is a 71 year old woman who is seen in follow-up for venous and lymphatic insufficiency ulcerations and edema especially right worse than left.  She has been undergoing serial compression wrapping on the right  Did have some small donated graft material applied in office. Pleased with the Improvement seen.  Assessment & Plan: Visit Diagnoses: No diagnosis found.  Plan: We will have her continue her in medical compression stockings.   Continue lymphedema pumps  She will follow up as needed.  Follow-Up Instructions: No follow-ups on file.   Ortho Exam  Patient is alert, oriented, no adenopathy, well-dressed, normal affect, normal respiratory effort.  Pleased with improvement in the edema from the compression wrapping with wrinkling of the skin and healing of ulcer to lateral right lower leg. Fully epithelialized. no drainage no erythema no warmth no maceration    Imaging: No results found. No images are attached to the encounter.  Labs: Lab Results  Component Value Date   HGBA1C 5.8 04/13/2020   HGBA1C 5.4 06/29/2017   ESRSEDRATE 30 (H) 03/21/2010   CRP 13.2 (H) 03/21/2010   REPTSTATUS 03/25/2010 FINAL 03/21/2010   REPTSTATUS 03/21/2010 FINAL 03/21/2010   GRAMSTAIN  03/21/2010    RARE WBC PRESENT, PREDOMINANTLY PMN NO ORGANISMS SEEN Gram Stain Report Called to,Read Back By and Verified With: Gram Stain Report Called to,Read Back By and Verified With: DR [facility] 2130 03/21/10 BY K PUGH Performed at [place]  03/21/2010    RARE WBC PRESENT, PREDOMINANTLY MONONUCLEAR NO ORGANISMS SEEN CALLED TO DR, LANDAU, 2130,03/21/10,K.PUGH   CULT NO  GROWTH 3 DAYS 03/21/2010   LABORGA STAPHYLOCOCCUS AUREUS 03/21/2010   LABORGA STAPHYLOCOCCUS AUREUS 03/21/2010     Lab Results  Component Value Date   ALBUMIN 3.8 05/05/2022   ALBUMIN 4.5 08/22/2021   ALBUMIN 3.9 04/13/2020    No results found for: "MG" Lab Results  Component Value Date   VD25OH 95.80 05/05/2022   VD25OH 50.37 04/13/2020   VD25OH 46.01 02/17/2017    No results found for: "PREALBUMIN"    Latest Ref Rng & Units 05/05/2022   11:46 AM 08/22/2021    8:22 PM 04/13/2020    9:29 AM  CBC EXTENDED  WBC 4.0 - 10.5 K/uL 5.7  5.3  4.7   RBC 3.87 - 5.11 Mil/uL 4.26  5.14  4.72   Hemoglobin 12.0 - 15.0 g/dL 12.4  14.6  12.3   HCT 36.0 - 46.0 % 37.3  45.0  37.2   Platelets 150.0 - 400.0 K/uL 262.0  238  219.0   NEUT# 1.4 - 7.7 K/uL 4.0  2.7  2.7   Lymph# 0.7 - 4.0 K/uL 1.0  1.8  1.4      There is no height or weight on file to calculate BMI.  Orders:  No orders of the defined types were placed in this encounter.  No orders of the defined types were placed in this encounter.    Procedures: No procedures performed  Clinical Data: No additional findings.  ROS:  All other systems negative, except as noted in the HPI. Review of Systems  Objective: Vital Signs: There were no vitals taken for this visit.  Specialty Comments:  No specialty comments available.  PMFS History: Patient Active Problem List   Diagnosis Date Noted   Morbid obesity ([place]) 04/24/2022   Leg injury 04/24/2022   History of colonic polyps    Polyp of rectum    Difficult intubation 05/29/2020   Chronic vulvitis 04/20/2020   Status post abdominoplasty 03/13/2020   History of bariatric surgery 03/13/2020   Mixed conductive and sensorineural hearing loss of both ears 10/24/2019   Sensorineural hearing loss (SNHL), bilateral 10/24/2019   Chronic pain disorder 09/10/2018   Compression fracture of thoracic vertebra ([place]) 07/13/2018   Panniculitis 06/18/2018   Asthma, intermittent,  uncomplicated 17/51/0258   GERD (gastroesophageal reflux disease) 03/02/2018   Ductal carcinoma in situ (DCIS) of left breast 03/01/2018   Degeneration of lumbar intervertebral disc 12/25/2017   Spinal stenosis of lumbar region 12/25/2017   Intertriginous candidiasis 10/02/2017   Polyp of transverse colon    Special screening for malignant neoplasms, colon    Vitamin D deficiency 02/16/2017   RLS (restless legs syndrome) 09/09/2016   Insomnia 07/11/2016   Anxiety and depression 07/11/2016   Generalized osteoarthritis of multiple sites 07/11/2016   Class 2 obesity with body mass index (BMI) of 36.0 to 36.9 in adult 01/28/2016   Shoulder pain 12/28/2014   Osteopenia 12/28/2014   Hot flashes 08/02/2014   Constipation 08/02/2014   Malignant neoplasm of upper-outer quadrant of left breast in female, estrogen receptor positive ([place]) 03/24/2014   CANDIDIASIS, SKIN 10/15/2009   PURE HYPERCHOLESTEROLEMIA 04/25/2009   Hyperlipidemia, acquired 02/16/2009   Sleep apnea 02/16/2009   Depression 06/18/2007   Essential hypertension 06/18/2007   Past Medical History:  Diagnosis Date   Allergy    seasonal allergies ([place])   Anxiety    on meds   Arthritis    knees    Asthma    rare;only when around alot of dust-Ventolin inhaler as needed   Breast cancer ([place])     left. States she did not have lymph nodes removed   Bursitis of right shoulder    Cataract    not had sx as of yet   Cellulitis and abscess of leg    Complication of anesthesia    was told 01/06/14 that airway was small   Depression    takes Effexor and Wellbutrin daily   GERD (gastroesophageal reflux disease)    on meds   Hearing loss    History of bronchitis 1966   Hyperlipidemia    on meds   Hypertension    takes Coreg and Losartan daily   Joint pain    Joint swelling    Neuromuscular disorder ([facility])    RLS   Obese    Peripheral edema    takes Furosemide daily as needed and Potassium daily   Personal history of  radiation therapy 2015   Radiation 03/08/14-04/26/14   50.4 gray to left breast. Lumpectomy cavity boosted to 64.4 gray   Restless legs syndrome    takes depakote   Sleep apnea    uses CPAP   Sleep apnea, obstructive    uses CPAP   Wears glasses     Family History  Problem Relation Age of Onset   Dementia Mother 59   Heart attack Mother 42   Esophageal cancer Father 54   Heart attack Father 50  Stomach cancer Father 4   Colon polyps Brother    Asthma Other    Hypertension Other    Thyroid disease Other    Heart attack Other    Throat cancer Paternal Grandfather    Colon cancer Neg Hx    Rectal cancer Neg Hx     Past Surgical History:  Procedure Laterality Date   BREAST LUMPECTOMY Left 2015   BREAST LUMPECTOMY WITH NEEDLE LOCALIZATION Left 01/24/2014   Procedure: BREAST LUMPECTOMY WITH NEEDLE LOCALIZATION;  Surgeon: Edward Jolly, MD;  Location: [facility];  Service: General;  Laterality: Left;   COLONOSCOPY N/A 03/20/2017   Procedure: COLONOSCOPY;  Surgeon: Mauri Pole, MD;  [facility];  Service: Endoscopy;  Laterality: N/A;   COLONOSCOPY  2018   COLONOSCOPY WITH PROPOFOL N/A 08/21/2020   Procedure: COLONOSCOPY WITH PROPOFOL;  Surgeon: Mauri Pole, MD;  [facility];  Service: Endoscopy;  Laterality: N/A;   DILATATION & CURETTAGE/HYSTEROSCOPY WITH TRUECLEAR N/A 01/06/2014   Procedure: DILATATION & CURETTAGE/HYSTEROSCOPY WITH TRUCLEAR;  Surgeon: Shon Millet II, MD;  Location: [facility];  Service: Gynecology;  Laterality: N/A;   HYSTEROPLASTY  01/2014   INNER EAR SURGERY Bilateral    for hearing loss   JOINT REPLACEMENT Right    Knee   LAPAROSCOPIC GASTRIC SLEEVE RESECTION N/A 01/28/2016   Procedure: LAPAROSCOPIC GASTRIC SLEEVE RESECTION WITH UPPER ENDO;  Surgeon: Excell Seltzer, MD;  [facility];  Service: General;  Laterality: N/A;   LIPOSUCTION WITH LIPOFILLING Bilateral 06/20/2019   Procedure: BILATERAL THIGH LIPECTOMY;  Surgeon:  Irene Limbo, MD;  Location: [facility];  Service: Plastics;  Laterality: Bilateral;   PANNICULECTOMY N/A 06/18/2018   Procedure: PANNICULECTOMY;  Surgeon: Irene Limbo, MD;  Location: [facility];  Service: Plastics;  Laterality: N/A;   POLYPECTOMY  08/21/2020   Procedure: POLYPECTOMY;  Surgeon: Mauri Pole, MD;  [facility];  Service: Endoscopy;;   RE-EXCISION OF BREAST LUMPECTOMY Left 02/02/2014   Procedure: RE-EXCISION OF LEFT BREAST LUMPECTOMY;  Surgeon: Edward Jolly, MD;  Location: [facility];  Service: General;  Laterality: Left;   TUBAL LIGATION     WISDOM TOOTH EXTRACTION  1970   Social History   Occupational History   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

## 2022-09-15 DIAGNOSIS — G4733 Obstructive sleep apnea (adult) (pediatric): Secondary | ICD-10-CM | POA: Diagnosis not present

## 2022-10-07 DIAGNOSIS — H5201 Hypermetropia, right eye: Secondary | ICD-10-CM | POA: Diagnosis not present

## 2022-10-13 NOTE — Progress Notes (Signed)
Appointment cancelled - please disregard. 

## 2022-10-16 DIAGNOSIS — G4733 Obstructive sleep apnea (adult) (pediatric): Secondary | ICD-10-CM | POA: Diagnosis not present

## 2022-10-20 DIAGNOSIS — I10 Essential (primary) hypertension: Secondary | ICD-10-CM | POA: Diagnosis not present

## 2022-11-15 DIAGNOSIS — G4733 Obstructive sleep apnea (adult) (pediatric): Secondary | ICD-10-CM | POA: Diagnosis not present

## 2022-11-25 DIAGNOSIS — H903 Sensorineural hearing loss, bilateral: Secondary | ICD-10-CM | POA: Diagnosis not present

## 2022-12-03 ENCOUNTER — Other Ambulatory Visit: Payer: Self-pay | Admitting: Internal Medicine

## 2022-12-03 DIAGNOSIS — R600 Localized edema: Secondary | ICD-10-CM

## 2023-01-05 ENCOUNTER — Other Ambulatory Visit: Payer: Self-pay | Admitting: Internal Medicine

## 2023-01-05 DIAGNOSIS — I10 Essential (primary) hypertension: Secondary | ICD-10-CM

## 2023-01-05 DIAGNOSIS — F32A Depression, unspecified: Secondary | ICD-10-CM

## 2023-01-08 DIAGNOSIS — G4733 Obstructive sleep apnea (adult) (pediatric): Secondary | ICD-10-CM | POA: Diagnosis not present

## 2023-01-09 DIAGNOSIS — R7303 Prediabetes: Secondary | ICD-10-CM | POA: Diagnosis not present

## 2023-01-09 DIAGNOSIS — E781 Pure hyperglyceridemia: Secondary | ICD-10-CM | POA: Diagnosis not present

## 2023-01-09 DIAGNOSIS — I10 Essential (primary) hypertension: Secondary | ICD-10-CM | POA: Diagnosis not present

## 2023-01-15 DIAGNOSIS — E782 Mixed hyperlipidemia: Secondary | ICD-10-CM | POA: Diagnosis not present

## 2023-01-15 DIAGNOSIS — G2581 Restless legs syndrome: Secondary | ICD-10-CM | POA: Diagnosis not present

## 2023-01-15 DIAGNOSIS — R600 Localized edema: Secondary | ICD-10-CM | POA: Diagnosis not present

## 2023-01-15 DIAGNOSIS — R7303 Prediabetes: Secondary | ICD-10-CM | POA: Diagnosis not present

## 2023-01-15 DIAGNOSIS — E876 Hypokalemia: Secondary | ICD-10-CM | POA: Diagnosis not present

## 2023-01-15 DIAGNOSIS — R944 Abnormal results of kidney function studies: Secondary | ICD-10-CM | POA: Diagnosis not present

## 2023-01-15 DIAGNOSIS — I10 Essential (primary) hypertension: Secondary | ICD-10-CM | POA: Diagnosis not present

## 2023-01-20 DIAGNOSIS — M25572 Pain in left ankle and joints of left foot: Secondary | ICD-10-CM | POA: Diagnosis not present

## 2023-01-20 DIAGNOSIS — R42 Dizziness and giddiness: Secondary | ICD-10-CM | POA: Diagnosis not present

## 2023-01-20 DIAGNOSIS — I10 Essential (primary) hypertension: Secondary | ICD-10-CM | POA: Diagnosis not present

## 2023-01-20 DIAGNOSIS — R600 Localized edema: Secondary | ICD-10-CM | POA: Diagnosis not present

## 2023-03-07 ENCOUNTER — Other Ambulatory Visit: Payer: Self-pay | Admitting: Internal Medicine

## 2023-03-07 DIAGNOSIS — K219 Gastro-esophageal reflux disease without esophagitis: Secondary | ICD-10-CM

## 2023-03-09 ENCOUNTER — Other Ambulatory Visit: Payer: Self-pay | Admitting: Internal Medicine

## 2023-03-09 DIAGNOSIS — E559 Vitamin D deficiency, unspecified: Secondary | ICD-10-CM

## 2023-04-22 ENCOUNTER — Telehealth: Payer: Self-pay | Admitting: Family

## 2023-04-22 ENCOUNTER — Ambulatory Visit: Payer: Medicare Other | Admitting: Family

## 2023-04-22 ENCOUNTER — Other Ambulatory Visit: Payer: Self-pay | Admitting: Orthopedic Surgery

## 2023-04-22 ENCOUNTER — Encounter: Payer: Self-pay | Admitting: Family

## 2023-04-22 DIAGNOSIS — L03116 Cellulitis of left lower limb: Secondary | ICD-10-CM

## 2023-04-22 MED ORDER — CEPHALEXIN 500 MG PO CAPS: 500.0000 mg | ORAL_CAPSULE | Freq: Three times a day (TID) | ORAL | 0 refills | Status: DC

## 2023-04-22 NOTE — Telephone Encounter (Signed)
Pt seen PA Erin today and wanted to know if she sent in and if not please do and send to [facility] on [address]. Pt phone number is [phone]

## 2023-04-22 NOTE — Progress Notes (Signed)
Office Visit Note   Patient: Tamara [name]           Date of Birth: 06/09/1951           MRN: [id number] Visit Date: 04/22/2023              Requested by: Philip Aspen, Limmie Patricia, MD [address] [place],  [place] [address] PCP: Philip Aspen, Limmie Patricia, MD  Chief Complaint  Patient presents with   Left Leg - Edema      HPI: The patient is a 72 year old woman who presents today with a about 1 week history of worsening edema to her left lower extremity with blistering erythema and firmness she has been to her primary care provider as well for the same she states she is unable to don and doff her compression garments independently.  Assessment & Plan: Visit Diagnoses:  1. Cellulitis of left lower extremity     Plan: Will apply Dynaflex compression today and place her on a course of Keflex she will follow-up in 1 week discussed return precautions  Follow-Up Instructions: Return in about 1 week (around 04/29/2023).   Ortho Exam  Patient is alert, oriented, no adenopathy, well-dressed, normal affect, normal respiratory effort. On examination of the left lower extremity she does have chronic edema with venous stasis.  She does have some worsening of her edema with induration there is no weeping today warmth no abscess  Imaging: No results found. No images are attached to the encounter.  Labs: Lab Results  Component Value Date   HGBA1C 5.8 04/13/2020   HGBA1C 5.4 06/29/2017   ESRSEDRATE 30 (H) 03/21/2010   CRP 13.2 (H) 03/21/2010   REPTSTATUS 03/25/2010 FINAL 03/21/2010   REPTSTATUS 03/21/2010 FINAL 03/21/2010   GRAMSTAIN  03/21/2010    RARE WBC PRESENT, PREDOMINANTLY PMN NO ORGANISMS SEEN Gram Stain Report Called to,Read Back By and Verified With: Gram Stain Report Called to,Read Back By and Verified With: DR [facility] 2130 03/21/10 BY K PUGH Performed at [facility]   GRAMSTAIN  03/21/2010    RARE WBC PRESENT, PREDOMINANTLY MONONUCLEAR NO  ORGANISMS SEEN CALLED TO DR, LANDAU, 2130,03/21/10,K.PUGH   CULT NO GROWTH 3 DAYS 03/21/2010   LABORGA STAPHYLOCOCCUS AUREUS 03/21/2010   LABORGA STAPHYLOCOCCUS AUREUS 03/21/2010     Lab Results  Component Value Date   ALBUMIN 3.8 05/05/2022   ALBUMIN 4.5 08/22/2021   ALBUMIN 3.9 04/13/2020    No results found for: "MG" Lab Results  Component Value Date   VD25OH 95.80 05/05/2022   VD25OH 50.37 04/13/2020   VD25OH 46.01 02/17/2017    No results found for: "PREALBUMIN"    Latest Ref Rng & Units 05/05/2022   11:46 AM 08/22/2021    8:22 PM 04/13/2020    9:29 AM  CBC EXTENDED  WBC 4.0 - 10.5 K/uL 5.7  5.3  4.7   RBC 3.87 - 5.11 Mil/uL 4.26  5.14  4.72   Hemoglobin 12.0 - 15.0 g/dL 19.1  47.8  29.5   HCT 36.0 - 46.0 % 37.3  45.0  37.2   Platelets 150.0 - 400.0 K/uL 262.0  238  219.0   NEUT# 1.4 - 7.7 K/uL 4.0  2.7  2.7   Lymph# 0.7 - 4.0 K/uL 1.0  1.8  1.4      There is no height or weight on file to calculate BMI.  Orders:  No orders of the defined types were placed in this encounter.  No orders of the defined  types were placed in this encounter.    Procedures: No procedures performed  Clinical Data: No additional findings.  ROS:  All other systems negative, except as noted in the HPI. Review of Systems  Objective: Vital Signs: There were no vitals taken for this visit.  Specialty Comments:  No specialty comments available.  PMFS History: Patient Active Problem List   Diagnosis Date Noted   Morbid obesity (HCC) 04/24/2022   Leg injury 04/24/2022   History of colonic polyps    Polyp of rectum    Difficult intubation 05/29/2020   Chronic vulvitis 04/20/2020   Status post abdominoplasty 03/13/2020   History of bariatric surgery 03/13/2020   Mixed conductive and sensorineural hearing loss of both ears 10/24/2019   Sensorineural hearing loss (SNHL), bilateral 10/24/2019   Chronic pain disorder 09/10/2018   Compression fracture of thoracic vertebra (HCC)  07/13/2018   Panniculitis 06/18/2018   Asthma, intermittent, uncomplicated 03/02/2018   GERD (gastroesophageal reflux disease) 03/02/2018   Ductal carcinoma in situ (DCIS) of left breast 03/01/2018   Degeneration of lumbar intervertebral disc 12/25/2017   Spinal stenosis of lumbar region 12/25/2017   Intertriginous candidiasis 10/02/2017   Polyp of transverse colon    Special screening for malignant neoplasms, colon    Vitamin D deficiency 02/16/2017   RLS (restless legs syndrome) 09/09/2016   Insomnia 07/11/2016   Anxiety and depression 07/11/2016   Generalized osteoarthritis of multiple sites 07/11/2016   Class 2 obesity with body mass index (BMI) of 36.0 to 36.9 in adult 01/28/2016   Shoulder pain 12/28/2014   Osteopenia 12/28/2014   Hot flashes 08/02/2014   Constipation 08/02/2014   Malignant neoplasm of upper-outer quadrant of left breast in female, estrogen receptor positive (HCC) 03/24/2014   CANDIDIASIS, SKIN 10/15/2009   PURE HYPERCHOLESTEROLEMIA 04/25/2009   Hyperlipidemia, acquired 02/16/2009   Sleep apnea 02/16/2009   Depression 06/18/2007   Essential hypertension 06/18/2007   Past Medical History:  Diagnosis Date   Allergy    seasonal allergies (Fall)   Anxiety    on meds   Arthritis    knees    Asthma    rare;only when around alot of dust-Ventolin inhaler as needed   Breast cancer (HCC)     left. States she did not have lymph nodes removed   Bursitis of right shoulder    Cataract    not had sx as of yet   Cellulitis and abscess of leg    Complication of anesthesia    was told 01/06/14 that airway was small   Depression    takes Effexor and Wellbutrin daily   GERD (gastroesophageal reflux disease)    on meds   Hearing loss    History of bronchitis 1966   Hyperlipidemia    on meds   Hypertension    takes Coreg and Losartan daily   Joint pain    Joint swelling    Neuromuscular disorder (HCC)    RLS   Obese    Peripheral edema    takes Furosemide  daily as needed and Potassium daily   Personal history of radiation therapy 2015   Radiation 03/08/14-04/26/14   50.4 gray to left breast. Lumpectomy cavity boosted to 64.4 gray   Restless legs syndrome    takes depakote   Sleep apnea    uses CPAP   Sleep apnea, obstructive    uses CPAP   Wears glasses     Family History  Problem Relation Age of Onset   Dementia Mother  82   Heart attack Mother 65   Esophageal cancer Father 53   Heart attack Father 79   Stomach cancer Father 74   Colon polyps Brother    Asthma Other    Hypertension Other    Thyroid disease Other    Heart attack Other    Throat cancer Paternal Grandfather    Colon cancer Neg Hx    Rectal cancer Neg Hx     Past Surgical History:  Procedure Laterality Date   BREAST LUMPECTOMY Left 2015   BREAST LUMPECTOMY WITH NEEDLE LOCALIZATION Left 01/24/2014   Procedure: BREAST LUMPECTOMY WITH NEEDLE LOCALIZATION;  Surgeon: Mariella Saa, MD;  [facility];  Service: General;  Laterality: Left;   COLONOSCOPY N/A 03/20/2017   Procedure: COLONOSCOPY;  Surgeon: Napoleon Form, MD;  [facility]: [facility];  Service: Endoscopy;  Laterality: N/A;   COLONOSCOPY  2018   COLONOSCOPY WITH PROPOFOL N/A 08/21/2020   Procedure: COLONOSCOPY WITH PROPOFOL;  Surgeon: Napoleon Form, MD;  [facility];  Service: Endoscopy;  Laterality: N/A;   DILATATION & CURETTAGE/HYSTEROSCOPY WITH TRUECLEAR N/A 01/06/2014   Procedure: DILATATION & CURETTAGE/HYSTEROSCOPY WITH TRUCLEAR;  Surgeon: Roselle Locus II, MD;  Location: [facility];  Service: Gynecology;  Laterality: N/A;   HYSTEROPLASTY  01/2014   INNER EAR SURGERY Bilateral    for hearing loss   JOINT REPLACEMENT Right    Knee   LAPAROSCOPIC GASTRIC SLEEVE RESECTION N/A 01/28/2016   Procedure: LAPAROSCOPIC GASTRIC SLEEVE RESECTION WITH UPPER ENDO;  Surgeon: Glenna Fellows, MD;  Location: [facility];  Service: General;  Laterality: N/A;   LIPOSUCTION WITH LIPOFILLING Bilateral  06/20/2019   Procedure: BILATERAL THIGH LIPECTOMY;  Surgeon: Glenna Fellows, MD;  Location: [facility];  Service: Plastics;  Laterality: Bilateral;   PANNICULECTOMY N/A 06/18/2018   Procedure: PANNICULECTOMY;  Surgeon: Glenna Fellows, MD;  Location:  [facility];  Service: Plastics;  Laterality: N/A;   POLYPECTOMY  08/21/2020   Procedure: POLYPECTOMY;  Surgeon: Napoleon Form, MD;  [facility];  Service: Endoscopy;;   RE-EXCISION OF BREAST LUMPECTOMY Left 02/02/2014   Procedure: RE-EXCISION OF LEFT BREAST LUMPECTOMY;  Surgeon: Mariella Saa, MD;  Location: [facility];  Service: General;  Laterality: Left;   TUBAL LIGATION     WISDOM TOOTH EXTRACTION  1970   Social History   Occupational History   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

## 2023-04-22 NOTE — Telephone Encounter (Signed)
This pt was in the office today and was supposed to have a keflex rx sent into the pharm. Denny Peon did not put in can you please send to pharm?

## 2023-04-23 NOTE — Telephone Encounter (Signed)
I called pt and advised that rx has been sent topharm.

## 2023-04-25 ENCOUNTER — Other Ambulatory Visit: Payer: Self-pay | Admitting: Internal Medicine

## 2023-04-25 DIAGNOSIS — E559 Vitamin D deficiency, unspecified: Secondary | ICD-10-CM

## 2023-04-29 ENCOUNTER — Encounter: Payer: Self-pay | Admitting: Family

## 2023-04-29 ENCOUNTER — Ambulatory Visit: Payer: Medicare Other | Admitting: Family

## 2023-04-29 DIAGNOSIS — I890 Lymphedema, not elsewhere classified: Secondary | ICD-10-CM

## 2023-04-29 DIAGNOSIS — L03116 Cellulitis of left lower limb: Secondary | ICD-10-CM | POA: Diagnosis not present

## 2023-04-29 NOTE — Progress Notes (Signed)
Office Visit Note   Patient: Tamara [name]           Date of Birth: [birth year]           MRN: [id number] Visit Date: 04/29/2023              Requested by: Philip Aspen, Limmie Patricia, MD [address] [place],  [place] [address] PCP: Philip Aspen, Limmie Patricia, MD  Chief Complaint  Patient presents with   Left Leg - Follow-up      HPI: The patient is a 72 year old woman who presents in follow-up for cellulitis of the left leg she has been in Dynaflex compression wrap for the last 1 week it has unfortunately pushed and rolled down in several places she denies fevers or chills.  She continues to have difficulty donning her lymphedema pump  Is currently completing a course of Keflex Assessment & Plan: Visit Diagnoses: No diagnosis found.  Plan: Complete Keflex as prescribed.  Today will resume her home compression garments.  Encouraged to resume her lymphedema pump.  She will follow-up as needed.  Follow-Up Instructions: Return if symptoms worsen or fail to improve.   Ortho Exam  Patient is alert, oriented, no adenopathy, well-dressed, normal affect, normal respiratory effort. On examination left lower extremity there is good wrinkling of the skin from compression.  Her foot however with pitting edema.  There is no erythema warmth or weeping she does have resolving cellulitis with peeling skin to the anterior shin no sign of infection  Imaging: No results found. No images are attached to the encounter.  Labs: Lab Results  Component Value Date   HGBA1C 5.8 04/13/2020   HGBA1C 5.4 06/29/2017   ESRSEDRATE 30 (H) 03/21/2010   CRP 13.2 (H) 03/21/2010   REPTSTATUS 03/25/2010 FINAL 03/21/2010   REPTSTATUS 03/21/2010 FINAL 03/21/2010   GRAMSTAIN  03/21/2010    RARE WBC PRESENT, PREDOMINANTLY PMN NO ORGANISMS SEEN Gram Stain Report Called to,Read Back By and Verified With: Gram Stain Report Called to,Read Back By and Verified With: DR [facility] 2130 03/21/10 BY K PUGH  Performed at [facility]   GRAMSTAIN  03/21/2010    RARE WBC PRESENT, PREDOMINANTLY MONONUCLEAR NO ORGANISMS SEEN CALLED TO DR, LANDAU, 2130,03/21/10,K.PUGH   CULT NO GROWTH 3 DAYS 03/21/2010   LABORGA STAPHYLOCOCCUS AUREUS 03/21/2010   LABORGA STAPHYLOCOCCUS AUREUS 03/21/2010     Lab Results  Component Value Date   ALBUMIN 3.8 05/05/2022   ALBUMIN 4.5 08/22/2021   ALBUMIN 3.9 04/13/2020    No results found for: "MG" Lab Results  Component Value Date   VD25OH 95.80 05/05/2022   VD25OH 50.37 04/13/2020   VD25OH 46.01 02/17/2017    No results found for: "PREALBUMIN"    Latest Ref Rng & Units 05/05/2022   11:46 AM 08/22/2021    8:22 PM 04/13/2020    9:29 AM  CBC EXTENDED  WBC 4.0 - 10.5 K/uL 5.7  5.3  4.7   RBC 3.87 - 5.11 Mil/uL 4.26  5.14  4.72   Hemoglobin 12.0 - 15.0 g/dL 19.1  47.8  29.5   HCT 36.0 - 46.0 % 37.3  45.0  37.2   Platelets 150.0 - 400.0 K/uL 262.0  238  219.0   NEUT# 1.4 - 7.7 K/uL 4.0  2.7  2.7   Lymph# 0.7 - 4.0 K/uL 1.0  1.8  1.4      There is no height or weight on file to calculate BMI.  Orders:  No orders of  the defined types were placed in this encounter.  No orders of the defined types were placed in this encounter.    Procedures: No procedures performed  Clinical Data: No additional findings.  ROS:  All other systems negative, except as noted in the HPI. Review of Systems  Objective: Vital Signs: There were no vitals taken for this visit.  Specialty Comments:  No specialty comments available.  PMFS History: Patient Active Problem List   Diagnosis Date Noted   Morbid obesity (HCC) 04/24/2022   Leg injury 04/24/2022   History of colonic polyps    Polyp of rectum    Difficult intubation 05/29/2020   Chronic vulvitis 04/20/2020   Status post abdominoplasty 03/13/2020   History of bariatric surgery 03/13/2020   Mixed conductive and sensorineural hearing loss of both ears 10/24/2019   Sensorineural hearing loss  (SNHL), bilateral 10/24/2019   Chronic pain disorder 09/10/2018   Compression fracture of thoracic vertebra (HCC) 07/13/2018   Panniculitis 06/18/2018   Asthma, intermittent, uncomplicated 03/02/2018   GERD (gastroesophageal reflux disease) 03/02/2018   Ductal carcinoma in situ (DCIS) of left breast 03/01/2018   Degeneration of lumbar intervertebral disc 12/25/2017   Spinal stenosis of lumbar region 12/25/2017   Intertriginous candidiasis 10/02/2017   Polyp of transverse colon    Special screening for malignant neoplasms, colon    Vitamin D deficiency 02/16/2017   RLS (restless legs syndrome) 09/09/2016   Insomnia 07/11/2016   Anxiety and depression 07/11/2016   Generalized osteoarthritis of multiple sites 07/11/2016   Class 2 obesity with body mass index (BMI) of 36.0 to 36.9 in adult 01/28/2016   Shoulder pain 12/28/2014   Osteopenia 12/28/2014   Hot flashes 08/02/2014   Constipation 08/02/2014   Malignant neoplasm of upper-outer quadrant of left breast in female, estrogen receptor positive (HCC) 03/24/2014   CANDIDIASIS, SKIN 10/15/2009   PURE HYPERCHOLESTEROLEMIA 04/25/2009   Hyperlipidemia, acquired 02/16/2009   Sleep apnea 02/16/2009   Depression 06/18/2007   Essential hypertension 06/18/2007   Past Medical History:  Diagnosis Date   Allergy    seasonal allergies (Fall)   Anxiety    on meds   Arthritis    knees    Asthma    rare;only when around alot of dust-Ventolin inhaler as needed   Breast cancer (HCC)     left. States she did not have lymph nodes removed   Bursitis of right shoulder    Cataract    not had sx as of yet   Cellulitis and abscess of leg    Complication of anesthesia    was told 01/06/14 that airway was small   Depression    takes Effexor and Wellbutrin daily   GERD (gastroesophageal reflux disease)    on meds   Hearing loss    History of bronchitis 1966   Hyperlipidemia    on meds   Hypertension    takes Coreg and Losartan daily   Joint  pain    Joint swelling    Neuromuscular disorder (HCC)    RLS   Obese    Peripheral edema    takes Furosemide daily as needed and Potassium daily   Personal history of radiation therapy 2015   Radiation 03/08/14-04/26/14   50.4 gray to left breast. Lumpectomy cavity boosted to 64.4 gray   Restless legs syndrome    takes depakote   Sleep apnea    uses CPAP   Sleep apnea, obstructive    uses CPAP   Wears glasses  Family History  Problem Relation Age of Onset   Dementia Mother 11   Heart attack Mother 93   Esophageal cancer Father 78   Heart attack Father 25   Stomach cancer Father 38   Colon polyps Brother    Asthma Other    Hypertension Other    Thyroid disease Other    Heart attack Other    Throat cancer Paternal Grandfather    Colon cancer Neg Hx    Rectal cancer Neg Hx     Past Surgical History:  Procedure Laterality Date   BREAST LUMPECTOMY Left 2015   BREAST LUMPECTOMY WITH NEEDLE LOCALIZATION Left 01/24/2014   Procedure: BREAST LUMPECTOMY WITH NEEDLE LOCALIZATION;  Surgeon: Mariella Saa, MD;  [facility];  Service: General;  Laterality: Left;   COLONOSCOPY N/A 03/20/2017   Procedure: COLONOSCOPY;  Surgeon: Napoleon Form, MD;  [facility];  Service: Endoscopy;  Laterality: N/A;   COLONOSCOPY  2018   COLONOSCOPY WITH PROPOFOL N/A 08/21/2020   Procedure: COLONOSCOPY WITH PROPOFOL;  Surgeon: Napoleon Form, MD;  [facility];  Service: Endoscopy;  Laterality: N/A;   DILATATION & CURETTAGE/HYSTEROSCOPY WITH TRUECLEAR N/A 01/06/2014   Procedure: DILATATION & CURETTAGE/HYSTEROSCOPY WITH TRUCLEAR;  Surgeon: Roselle Locus II, MD;  Location: [facility];  Service: Gynecology;  Laterality: N/A;   HYSTEROPLASTY  01/2014   INNER EAR SURGERY Bilateral    for hearing loss   JOINT REPLACEMENT Right    Knee   LAPAROSCOPIC GASTRIC SLEEVE RESECTION N/A 01/28/2016   Procedure: LAPAROSCOPIC GASTRIC SLEEVE RESECTION WITH UPPER ENDO;  Surgeon:  Glenna Fellows, MD;  Location: [facility];  Service: General;  Laterality: N/A;   LIPOSUCTION WITH LIPOFILLING Bilateral 06/20/2019   Procedure: BILATERAL THIGH LIPECTOMY;  Surgeon: Glenna Fellows, MD;  Location: [facility];  Service: Plastics;  Laterality: Bilateral;   PANNICULECTOMY N/A 06/18/2018   Procedure: PANNICULECTOMY;  Surgeon: Glenna Fellows, MD;  Location: [facility];  Service: Plastics;  Laterality: N/A;   POLYPECTOMY  08/21/2020   Procedure: POLYPECTOMY;  Surgeon: Napoleon Form, MD;  [facility];  Service: Endoscopy;;   RE-EXCISION OF BREAST LUMPECTOMY Left 02/02/2014   Procedure: RE-EXCISION OF LEFT BREAST LUMPECTOMY;  Surgeon: Mariella Saa, MD;  [facility];  Service: General;  Laterality: Left;   TUBAL LIGATION     WISDOM TOOTH EXTRACTION  1970   Social History   Occupational History   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

## 2023-05-22 ENCOUNTER — Other Ambulatory Visit: Payer: Self-pay | Admitting: Internal Medicine

## 2023-05-22 DIAGNOSIS — K219 Gastro-esophageal reflux disease without esophagitis: Secondary | ICD-10-CM

## 2023-05-22 DIAGNOSIS — E559 Vitamin D deficiency, unspecified: Secondary | ICD-10-CM

## 2023-06-30 ENCOUNTER — Other Ambulatory Visit: Payer: Self-pay | Admitting: Internal Medicine

## 2023-06-30 DIAGNOSIS — E559 Vitamin D deficiency, unspecified: Secondary | ICD-10-CM

## 2023-06-30 DIAGNOSIS — F32A Depression, unspecified: Secondary | ICD-10-CM

## 2023-06-30 DIAGNOSIS — I10 Essential (primary) hypertension: Secondary | ICD-10-CM

## 2023-07-20 ENCOUNTER — Ambulatory Visit: Payer: BC Managed Care – PPO | Admitting: Physical Therapy

## 2023-08-21 ENCOUNTER — Other Ambulatory Visit: Payer: Self-pay | Admitting: Internal Medicine

## 2023-08-21 DIAGNOSIS — E559 Vitamin D deficiency, unspecified: Secondary | ICD-10-CM

## 2023-08-21 DIAGNOSIS — K219 Gastro-esophageal reflux disease without esophagitis: Secondary | ICD-10-CM

## 2023-10-09 ENCOUNTER — Telehealth: Payer: Self-pay | Admitting: Orthopedic Surgery

## 2023-10-09 NOTE — Telephone Encounter (Signed)
Said she called the wrong doctor

## 2023-10-09 NOTE — Telephone Encounter (Signed)
Patient called and needs a refill on prednisone. She needs it to go to [facility] on [address]. CB#[phone]

## 2023-11-05 ENCOUNTER — Other Ambulatory Visit: Payer: Self-pay | Admitting: Internal Medicine

## 2023-11-05 DIAGNOSIS — K219 Gastro-esophageal reflux disease without esophagitis: Secondary | ICD-10-CM

## 2024-02-21 ENCOUNTER — Encounter (HOSPITAL_BASED_OUTPATIENT_CLINIC_OR_DEPARTMENT_OTHER): Payer: Self-pay | Admitting: Pulmonary Disease

## 2024-06-23 ENCOUNTER — Encounter (HOSPITAL_BASED_OUTPATIENT_CLINIC_OR_DEPARTMENT_OTHER): Payer: Self-pay | Admitting: Pulmonary Disease

## 2024-06-23 ENCOUNTER — Ambulatory Visit (HOSPITAL_BASED_OUTPATIENT_CLINIC_OR_DEPARTMENT_OTHER): Admitting: Pulmonary Disease

## 2024-06-23 VITALS — BP 110/68 | Ht 65.0 in | Wt 208.0 lb

## 2024-06-23 DIAGNOSIS — G4733 Obstructive sleep apnea (adult) (pediatric): Secondary | ICD-10-CM | POA: Diagnosis not present

## 2024-06-23 DIAGNOSIS — G2581 Restless legs syndrome: Secondary | ICD-10-CM | POA: Diagnosis not present

## 2024-06-23 NOTE — Patient Instructions (Signed)
  VISIT SUMMARY: During your visit today, we discussed the management of your CPAP machine, worsening restless leg syndrome, and your recent spine fracture. We made some adjustments to your CPAP settings and discussed potential treatments for your restless leg syndrome, including the possibility of low iron levels. We also reviewed your history of falls and the need to be cautious with medications that may increase your fall risk.  YOUR PLAN: -CPAP MANAGEMENT: Your CPAP machine helps you breathe better while you sleep. We will adjust the settings to auto mode with a range of 8 to 12 cm H2O to improve its effectiveness. Additionally, we will provide you with an AirFit F30 full face mask to address the mask leak you have been experiencing.  -RESTLESS LEGS SYNDROME: Restless leg syndrome causes an uncontrollable urge to move your legs, often leading to sleep disturbances. We will test your iron levels, as low iron can worsen these symptoms. If your iron levels are low, we will start iron supplementation. If your iron levels are normal, we will prescribe a low dose of gabapentin  to be taken at night. Gabapentin  may help with your symptoms but can cause drowsiness, so we will monitor you for excessive daytime sleepiness and adjust the treatment if necessary.                        Contains text generated by [employer]bridge.                                 Contains text generated by [employer]bridge.

## 2024-06-23 NOTE — Progress Notes (Signed)
 Subjective:    Patient ID: Tamara [name], female    DOB: [birth year], 73 y.o.   MRN: [id number]   73 yo for follow-up of OSA and sleep maintenance insomnia She underwent gastric sleeve surgery in 2016 and lost about 70 pounds.    Discussed the use of AI scribe software for clinical note transcription with the patient, who gave verbal consent to proceed.  History of Present Illness Tamara [name] is a 73 year old female who presents for a follow-up regarding her CPAP machine and worsening restless leg syndrome.  She uses a full face CPAP mask covering her nose and mouth, which she prefers. The CPAP machine is a newer model provided after her last visit.  Her restless leg syndrome symptoms have worsened, now occurring during the daytime in addition to the evenings, impacting her ability to fall asleep. She is unsure of the specific medication she takes for this condition but acknowledges having a complex medication regimen.  She has a history of falls, including a recent incident where she fell backward and fractured her spine after outpatient back surgery. She needs to be cautious as she can fall even while standing. She is currently taking trazodone  at bedtime, which initially helped her sleep but has been less effective recently. She has also tried melatonin but is hesitant to continue its use.    DL  Residual events average 5.7 per hour, with some nights reaching 20 events per hour, and a large leak at 9 cm H2O. Good usage> 9h per night   Significant tests/ events reviewed   PSG 03/2013 - 291 lbs-  severe OSA with AHI 66/hour  corrected with CPAP 9 cm.  She also had moderate PLM's 40/hour, however PLM arousal index was low at 0.9/hour.   05/2018 HST mild AHI 13/h , predom supine   Review of Systems  neg for any significant sore throat, dysphagia, itching, sneezing, nasal congestion or excess/ purulent secretions, fever, chills, sweats, unintended wt loss, pleuritic or  exertional cp, hempoptysis, orthopnea pnd or change in chronic leg swelling. Also denies presyncope, palpitations, heartburn, abdominal pain, nausea, vomiting, diarrhea or change in bowel or urinary habits, dysuria,hematuria, rash, arthralgias, visual complaints, headache, numbness weakness or ataxia.      Objective:   Physical Exam  Gen. Pleasant, elderly,obese, in no distress ENT - no lesions, no post nasal drip Neck: No JVD, no thyromegaly, no carotid bruits Lungs: no use of accessory muscles, no dullness to percussion, decreased without rales or rhonchi  Cardiovascular: Rhythm regular, heart sounds  normal, no murmurs or gallops, no peripheral edema Musculoskeletal: No deformities, no cyanosis or clubbing , no tremors, walker       Assessment & Plan:   Assessment and Plan Assessment & Plan OSA on CPAP   - Change CPAP settings to auto mode with a range of 8 to 12 cm H2O. - Provide AirFit F30 full face mask to address mask leak.  Restless legs syndrome Symptoms are worsening, occurring during the daytime and causing nocturnal sleep disturbances. Potential role of low iron levels discussed, with gabapentin  as an option if iron levels are normal. Gabapentin  may cause drowsiness, beneficial at night but undesirable during the day due to her history of falls. - Order iron level test. - If iron levels are low, initiate iron supplementation. - If iron levels are normal, prescribe low-dose gabapentin  to be taken at night. - Monitor for excessive daytime sleepiness with gabapentin  and adjust treatment if necessary.

## 2024-06-24 ENCOUNTER — Ambulatory Visit: Payer: Self-pay | Admitting: Pulmonary Disease

## 2024-06-24 LAB — IRON,TIBC AND FERRITIN PANEL
Ferritin: 35 ng/mL (ref 15–150)
Iron Saturation: 17 % (ref 15–55)
Iron: 53 ug/dL (ref 27–139)
Total Iron Binding Capacity: 310 ug/dL (ref 250–450)
UIBC: 257 ug/dL (ref 118–369)

## 2024-07-21 DIAGNOSIS — R609 Edema, unspecified: Secondary | ICD-10-CM | POA: Diagnosis not present

## 2024-07-21 DIAGNOSIS — I10 Essential (primary) hypertension: Secondary | ICD-10-CM | POA: Diagnosis not present

## 2024-07-21 DIAGNOSIS — R399 Unspecified symptoms and signs involving the genitourinary system: Secondary | ICD-10-CM | POA: Diagnosis not present

## 2024-07-29 DIAGNOSIS — M542 Cervicalgia: Secondary | ICD-10-CM | POA: Diagnosis not present

## 2024-09-23 DIAGNOSIS — L0390 Cellulitis, unspecified: Secondary | ICD-10-CM | POA: Diagnosis not present

## 2024-10-03 ENCOUNTER — Encounter: Payer: Self-pay | Admitting: Radiology

## 2024-10-21 DIAGNOSIS — R030 Elevated blood-pressure reading, without diagnosis of hypertension: Secondary | ICD-10-CM | POA: Diagnosis not present

## 2024-10-21 DIAGNOSIS — S4992XA Unspecified injury of left shoulder and upper arm, initial encounter: Secondary | ICD-10-CM | POA: Diagnosis not present
